# Patient Record
Sex: Male | Born: 1950 | Race: Black or African American | Hispanic: No | Marital: Married | Smoking: Never smoker
Health system: Southern US, Community
[De-identification: ages and names within clinical notes are randomized; demographics above are authoritative.]

## PROBLEM LIST (undated history)

## (undated) DIAGNOSIS — I4891 Unspecified atrial fibrillation: Secondary | ICD-10-CM

## (undated) DIAGNOSIS — F329 Major depressive disorder, single episode, unspecified: Secondary | ICD-10-CM

## (undated) DIAGNOSIS — E7800 Pure hypercholesterolemia, unspecified: Secondary | ICD-10-CM

## (undated) DIAGNOSIS — F32A Depression, unspecified: Secondary | ICD-10-CM

## (undated) DIAGNOSIS — M1990 Unspecified osteoarthritis, unspecified site: Secondary | ICD-10-CM

## (undated) DIAGNOSIS — G8929 Other chronic pain: Secondary | ICD-10-CM

## (undated) DIAGNOSIS — F1011 Alcohol abuse, in remission: Secondary | ICD-10-CM

## (undated) DIAGNOSIS — I639 Cerebral infarction, unspecified: Secondary | ICD-10-CM

## (undated) DIAGNOSIS — F419 Anxiety disorder, unspecified: Secondary | ICD-10-CM

## (undated) DIAGNOSIS — M169 Osteoarthritis of hip, unspecified: Secondary | ICD-10-CM

## (undated) DIAGNOSIS — I509 Heart failure, unspecified: Secondary | ICD-10-CM

## (undated) DIAGNOSIS — Z86718 Personal history of other venous thrombosis and embolism: Secondary | ICD-10-CM

## (undated) DIAGNOSIS — B1920 Unspecified viral hepatitis C without hepatic coma: Secondary | ICD-10-CM

## (undated) DIAGNOSIS — I2510 Atherosclerotic heart disease of native coronary artery without angina pectoris: Secondary | ICD-10-CM

## (undated) DIAGNOSIS — D649 Anemia, unspecified: Secondary | ICD-10-CM

## (undated) DIAGNOSIS — I10 Essential (primary) hypertension: Secondary | ICD-10-CM

## (undated) DIAGNOSIS — C61 Malignant neoplasm of prostate: Secondary | ICD-10-CM

## (undated) DIAGNOSIS — C649 Malignant neoplasm of unspecified kidney, except renal pelvis: Secondary | ICD-10-CM

## (undated) HISTORY — PX: CORONARY ANGIOPLASTY WITH STENT PLACEMENT: SHX49

## (undated) SURGERY — IMAGING PROCEDURE, GI TRACT, INTRALUMINAL, VIA CAPSULE
Anesthesia: LOCAL

---

## 1973-03-15 HISTORY — PX: EYE SURGERY: SHX253

## 1999-03-16 HISTORY — PX: LUMBAR DISC SURGERY: SHX700

## 2005-07-18 ENCOUNTER — Inpatient Hospital Stay (HOSPITAL_COMMUNITY): Admission: RE | Admit: 2005-07-18 | Discharge: 2005-07-22 | Payer: Self-pay | Admitting: Psychiatry

## 2005-07-19 ENCOUNTER — Ambulatory Visit: Payer: Self-pay | Admitting: Psychiatry

## 2007-01-03 ENCOUNTER — Inpatient Hospital Stay (HOSPITAL_COMMUNITY): Admission: RE | Admit: 2007-01-03 | Discharge: 2007-01-06 | Payer: Self-pay | Admitting: Orthopaedic Surgery

## 2007-01-03 HISTORY — PX: TOTAL HIP ARTHROPLASTY: SHX124

## 2007-01-03 HISTORY — PX: JOINT REPLACEMENT: SHX530

## 2010-07-28 NOTE — Op Note (Signed)
NAMEMarland Kitchen  KYSHON, TOLLIVER NO.:  [id number]   MEDICAL RECORD NO.:  [id number]          PATIENT TYPE:  INP   LOCATION:  2899                         FACILITY:  [facility]   PHYSICIAN:  Lubertha Basque. Dalldorf, M.D.DATE OF BIRTH:  06/16/1950   DATE OF PROCEDURE:  01/03/2007  DATE OF DISCHARGE:                               OPERATIVE REPORT   PREOPERATIVE DIAGNOSES:  Left hip degenerative arthritis.   POSTOPERATIVE DIAGNOSES:  Left hip degenerative arthritis.   PROCEDURE:  Left total hip replacement.   ANESTHESIA:  General.   ATTENDING SURGEON:  Dr. Marcene Corning.   ASSISTANT:  Lindwood Qua, P.A.   INDICATIONS FOR PROCEDURE:  The patient is a 60 year old male with a  long history of a painful left hip.  This persisted despite oral anti-  inflammatories and the use of a cane.  He has advanced degenerative  change on x-ray and at this point has pain which limits his ability to  rest and walk. He is offered a hip replacement operation.  Informed  operative consent was obtained after discussion of the possible  complications of reaction to anesthesia, infection, DVT, PE, leg-length  discrepancy, dislocation, and death.   SUMMARY OF FINDINGS AND PROCEDURE:  Under general anesthesia through a  posterior approach, a left total hip replacement was performed.  He had  advanced degenerative change but excellent bone quality.  We addressed  this problem with an uncemented DePuy ASR system using a six high offset  Summit stem with a size 54 ASR cup.  We placed a size 47, -1 ASR head on  the femoral neck.  Bryna Colander assisted throughout and was invaluable  to the completion of the case in that he helped position and retract  while I performed the procedure. He also closed simultaneously to help  minimize OR time.   DESCRIPTION OF PROCEDURE:  The patient was taken to the operating suite  where general anesthetic was applied without difficulty.  He was  positioned in the lateral  decubitus position with the left hip up.  An  axillary roll was placed and all bony prominences were appropriately  padded.  Hip positioners were utilized.  He was then prepped and draped  in the normal sterile fashion.  After administration of preop IV Kefzol,  a posterior approach was taken to the left hip.  All appropriate anti-  infective measures were used including the preoperative IV antibiotic,  Betadine impregnated drape, and closed hooded exhaust systems for each  member of the surgical team.  Dissection was carried down through  adipose tissue to the IT band and gluteus maximus fascia.  These very  thick structures were then incised longitudinally to expose the short  external rotators of the hip which were tagged and reflected.  A  posterior capsulectomy was performed and the hip was dislocated.  I made  a femoral neck cut just above the lesser trochanter and removed the  femoral head.  The acetabulum was fully exposed.  Some osteophytes were  removed followed by reaming towards the inside wall of the pelvis with a  small reamer.  We  then sequentially reamed up to a 53 followed by  placement of a size 54 ASR cup in appropriate anteversion and tilt.  Attention was then turned toward the femur.  The canal was sounded  followed by reaming up to a 6 followed by sequential broaching up to a  size 6 which seemed to fit well. We then performed a trial reduction and  the -1 assembly seemed to be most appropriate in terms of leg length and  stability and extension with external rotation and flexion with internal  rotation. These trial components removed followed by placement of a size  6 high offset Summit stem capped with a -1, 47 hip ball ASR assembly.  Again the hip was reduced and was stable.  Leg lengths were judged to be  roughly equal.  The wound was irrigated followed by reapproximation of  the short external rotators to the greater trochanteric region with  nonabsorbable  suture.  IT band and gluteus maximus fascia reapproximated  with #1 Vicryl interrupted fashion.  The wound was irrigated prior to  this closure and again after this layer of closure.  The subcutaneous  tissue was reapproximated with #0 and 2-0 undyed Vicryl and skin was  closed with staples.  Adaptic was applied followed by dry gauze and  tape.  Estimated blood loss and intraoperative fluids can be obtained  from the anesthesia records.   DISPOSITION:  The patient was extubated in the operating room and taken  to the recovery room in stable addition.  He was to be admitted to the  orthopedic surgery service for appropriate postop care to include  perioperative antibiotics and Coumadin plus Lovenox for DVT prophylaxis.      Lubertha Basque Jerl Santos, M.D.  Electronically Signed     PGD/MEDQ  D:  01/03/2007  T:  01/04/2007  Job:  [id number]

## 2010-07-31 NOTE — Discharge Summary (Signed)
NAME:  Frank Barron, MERA NO.:  [id number]   MEDICAL RECORD NO.:  [id number]          PATIENT TYPE:  IPS   LOCATION:  0300                          [facility]   PHYSICIAN:  Geoffery Lyons, M.D.      DATE OF BIRTH:  August 25, 1950   DATE OF ADMISSION:  07/18/2005  DATE OF DISCHARGE:  07/22/2005                                 DISCHARGE SUMMARY   CHIEF COMPLAINT AND PRESENTING ILLNESS:  This was the first admission to  [facility]  for this 60 year old African-American male,  married, separated.  Presented to the emergency room, had been having  thoughts of jumping off a bridge.  Had been on a cocaine and alcohol binge  for a couple  of days.  He did not come home, going back to his mother's  house, and felt very ashamed of himself for his drug use.  Has violated his  mother's trust and was feeling suicidal.  Separated from his wife of 37  years.  Moved back to [place] to live with his mother from his former  residence in [place], [place].  Endorsed some depressed mood, suicidal  thoughts, drinking a 6-pack of beer and possibly more for the previous 2 to  3 days.  Endorsing negative influences from his life with alcohol, and his  crack cocaine as smoke, for many years.  Doing once of twice a week.  Has  been using it regularly for the past couple of days.   PAST PSYCHIATRIC HISTORY:  First inpatient stay.   ALCOHOL AND DRUG HISTORY:  Endorsed having problem with alcohol and more so  the cocaine, which he said is a larger issue in his life.   PAST MEDICAL HISTORY:  Noncontributory.   PHYSICAL EXAMINATION:  Performed, failed to show any acute findings.   LABORATORY WORKUP:  Blood chemistries:  Sodium 138, potassium 4.1, glucose  109, BUN 10, creatinine 1.2, TSH 0.590.  Liver enzymes: SGOT 29, SGPT 30.  Total bilirubin 0.1, white blood cells 5.0, hemoglobin 14.9.  Urine drug  screen positive for cocaine.  Alcohol level upon admission 138.   MENTAL  STATUS EXAM:  Reveals a fully alert, pleasant, cooperative male.  Mood depressed, affect constricted.  Thoughts positive for some suicidal  thoughts, thinking that he would like to jump off a bridge, dealing with  guilt, ashamed of his drug use, hopeless to be able to live his life  properly. Endorsed a lot of guilt.  Minimizes use of alcohol but admits that  drugs have ruined his life.   ADMISSION DIAGNOSES:  AXIS I:  Cocaine abuse rule out dependence, alcohol  abuse, depressive disorder not otherwise specified.  AXIS II:  No diagnosis.  AXIS III:  High blood pressure.  AXIS IV:  Moderate.  AXIS V:  Upon admission 38, highest global assessment of functioning in the  last year 68.   COURSE IN HOSPITAL:  He was admitted and started in individual and group  psychotherapy. He was detoxified with Librium and was given trazodone for  sleep and started on lisinopril 10 mg per day for his  blood pressure.  He  endorsed that he came to [place] from [place].  He has family here, his  mother, his brothers.  Separated from his wife.  Dealing with the addiction.  He endorsed that when things are really going well for him, he relapses.  For him, this time was especially bad because his mother saw him like he is  when he is in full relapse.  Very ashamed of himself.  Aware of all the  consequences of his addiction, all the losses.  Strong family history of  addiction.  He endorsed depression when he is under the influence or when he  is coming off the drugs.  Endorsed that he was trying to get his life back  together, wants to get himself established in [place].  Unsure if he  wanted to go to a 28 day program.  The family was supportive but they were  able to share with him their disappointment for his relapse.  He was going  to go to [facility].  He worked towards letting go of the shame and the guilt.  We continued to work on relapse prevention.  Initially the thought was that  the family was  going to allow him a place to stay, but later on they  endorsed that they would not want to do that, so detoxed he was going to go  into a halfway house and would work with [facility].  Continued to work on a  relapse prevention plan and on May 10, he was in full contact with reality.  No active suicidal or homicidal ideation.  He was fully detoxed.  He had  dealt with all the negative emotions having to do with his relapse.  He was  committed to abstinence.   DISCHARGE DIAGNOSES:  AXIS I:  Cocaine dependence, alcohol abuse, substance-  induced depression.  AXIS II:  No diagnosis.  AXIS III:  Arterial hypertension.  AXIS IV:  Moderate.  AXIS V:  Upon discharge 55-60.   DISCHARGE MEDICATIONS:  Hydrochlorothiazide 50 mg per day, lisinopril 10 mg  per day.   DISPOSITION:  Follow up [facility] and [facility].      Geoffery Lyons, M.D.  Electronically Signed     IL/MEDQ  D:  08/11/2005  T:  08/11/2005  Job:  [id number]

## 2010-07-31 NOTE — H&P (Signed)
NAME:  Frank Barron, Frank Barron NO.:  [id number]   MEDICAL RECORD NO.:  [id number]          PATIENT TYPE:  IPS   LOCATION:  0300                          [facility]   PHYSICIAN:  Geoffery Lyons, M.D.      DATE OF BIRTH:  1950-08-07   DATE OF ADMISSION:  07/18/2005  DATE OF DISCHARGE:                         PSYCHIATRIC ADMISSION ASSESSMENT   IDENTIFYING INFORMATION:  This is a 60 year old African-American male who is  married and currently separated.  This is a voluntary admission.   HISTORY OF PRESENT ILLNESS:  This patient presented depressed in the  emergency room and had been having thoughts of possibly jumping off of a  bridge.  He had been on a cocaine and alcohol binge for a couple of days and  said that he did not come home going back to his mother's house as planned  and felt very ashamed of himself for his drug use, feels that he has  violated his mother's trust and was feeling suicidal.  He is currently  separated from his wife of 27 years 3 weeks ago and moved back to [place]  to live with his mother from his former residence in [place], [place].  The  patient endorses some depressed mood and suicidal ideation, has been  drinking at least a 6-pack of beer and possibly more for the previous 2-3  days and endorses have intermittent problems throughout his life with  alcohol, has used crack cocaine and smoked that for many years, and is  smoking approximately once to twice a week but has been using regularly for  the past couple of days.  Alcohol level in the emergency room was 138.  Urine drug screen was positive for cocaine.   PAST PSYCHIATRIC HISTORY:  This the patient's first inpatient psychiatric  admission.  He reports having no prior treatment, never been treated for  mood disorder or substance abuse before.  He does endorse having issues with  alcohol and cocaine for many, many years, with the alcohol issue being more  intermittent, the cocaine he feels  is a larger issue in his life.  He  reports that he has mental cravings for it but no real physical symptoms of  cravings.  No history of hallucinations, no prior suicide attempt.   SOCIAL HISTORY:  The patient is separated from his wife of 27 years, has 2  children, a 49 year old that lives in [place] and a 59 year old that lives  in [place].  Says that his current illness has destroyed his family life.  Currently living with his mother in [place].  His father is deceased.  Has a history of military service with honorable discharge.  He is of the  [employer].  No current legal problems.  Does have a distant history of  arrest in 1991 for embezzlement.   PAST MEDICAL HISTORY:  The patient denies any current medical problems, has  no regular primary care , did get some regular physicals in [place]  but took no medications.  Only current problem at this point is elevated  blood pressure, rule out hypertension.  Past medical history is  remarkable  for no history of seizures, blackouts, or memory loss.  Prior substance  abuse as noted.  He has a history of back surgery in 2001 for herniated disk  and eye surgery in 1975, and arthritic symptoms in his knees bilaterally.   POSITIVE PHYSICAL FINDINGS:  On admission to the unit, vital signs  temperature 98,6, pulse 93, respirations 24, blood pressure 180/106, six  feet tall, 259 pounds.  The patient's full physical examination was done in  the emergency room and is noted in the record.  On admission here to the  unit, we note that his motor exam is normal, neurologic is nonfocal, CIWA is  less than 5 this morning.   DIAGNOSTIC STUDIES:  WBC 5.0, hemoglobin 14.9, hematocrit 43.3, platelets  198,000.  Electrolytes, sodium 143, potassium 4.6, chloride 108, CO2 28, BUN  15, creatinine 1.1, random glucose 87.  Liver enzymes, SGOT 29, SGPT 30,  alkaline phosphatase 51, and total bilirubin is 0.1.  Calcium normal at 9.3.  Urine drug  screen positive for cocaine.  Alcohol level 138.   MENTAL STATUS EXAM:  Fully alert male, pleasant, cooperative, healthy in  appearance, large build, normal motor, speech normal in pace, tone and  amount.  Affect is quite blunted, mood depressed, thought process positive  for some suicidal thoughts, thinking that he would jump off a bridge,  ashamed of his drug use, feels hopeless to be able to live his life  properly.  His insight is adequate.  He does have a lot of guilt, some  minimizing of the alcohol but generally admits that drugs have ruined his  life.  Cognitively he is intact,  Memory is intact, no homicidal thoughts,  no psychosis, impulse control and judgment within normal limits..   ADMISSION DIAGNOSIS:  AXIS I:  Rule out major depression, initial episode,  versus substance-induced mood disorder, ethyl alcohol abuse rule out  dependence, cocaine abuse rule out dependence.  AXIS II:  Deferred.  AXIS III:  Elevated blood pressure, rule out hypertension.  AXIS IV:  Moderate to severe stresses in life due to social issues of  substance use.  AXIS V:  Current 38, past year 43.   INITIAL PLAN OF CARE:  Plan is to voluntarily admit the patient with q.15  minute checks in place, to alleviate his suicidal thought and safely detox  him from alcohol.  At this point we have him on a Librium protocol which he  is tolerating well.  We asked him to consider a family session with his  mother which he is going to think about.  He feels he is not ready to have  that session yet.  We are considering Wellbutrin to treat his depression but  we will let him make some progress with the Librium before we start that.  He has no history of seizures   ESTIMATED LENGTH OF STAY:  5 days.      Margaret A. Scott, N.P.      Geoffery Lyons, M.D.  Electronically Signed    MAS/MEDQ  D:  07/19/2005  T:  07/19/2005  Job:  [id number]

## 2010-07-31 NOTE — Discharge Summary (Signed)
NAME:  Frank Barron, Frank Barron             ACCOUNT NO.:  [id number]   MEDICAL RECORD NO.:  [id number]          PATIENT TYPE:  INP   LOCATION:  5030                         FACILITY:  [facility]   PHYSICIAN:  Lubertha Basque. Dalldorf, M.D.DATE OF BIRTH:  [birth year]   DATE OF ADMISSION:  01/03/2007  DATE OF DISCHARGE:  01/06/2007                               DISCHARGE SUMMARY   ADMITTING DIAGNOSIS:  Left hip end-stage degenerative joint disease.   DISCHARGE DIAGNOSIS:  Left hip end-stage degenerative joint disease.   OPERATIONS:  Left total hip replacement.   BRIEF HISTORY:  Mr. Narramore is a 60 year old black male patient, well-  known to our practice.  Has been having increasing left hip pain with  every step and trouble sleeping at nighttime.  X-rays reveal end-stage  DJD, bone on bone arthritis severe.  We have discussed treatment options  with him, that being total hip replacement, the risks of anesthesia,  infection, DVT, and possible death.   PERTINENT LABORATORY AND X-RAY DATA:  He has sinus bradycardia on EKG.  WBC is 9.9, hemoglobin 9.4 with a drop from 14.8.  Platelets 136.  Serial INRs were done, as he was on low dose Coumadin protocol for DVT  prophylaxis.  Sodium 135, potassium 3.8, glucose 112, BUN 8, creatinine  0.99.   COURSE IN THE HOSPITAL:  He was admitted after surgery.  Was placed on  regular diet and IV of lactated ringers, Ancef a gram q.8 hours x3  doses, Coumadin and Lovenox protocol per pharmacy for DVT prophylaxis  and then various other oral pain medicines, anti-medics, VITEDs,  incentive spirometry, physical therapy ordered to get him up and out of  bed, touchdown weight bearing.  The first day postop, blood pressure was  136/87, hemoglobin 11.9, temperature 101, without obvious source of  infection.  Wound was noted to be benign without drainage.  Lungs were  clear.  Abdomen was soft.  Subsequent days, the second day postop, his  blood pressure remained good at 140/88,  hemoglobin 10.9, temperature 98.  Lungs clear, abdomen soft.  Dressing was changed and wound was noted to  be benign.  No sign of infection or irritation.  He continued to  progress well with physical therapy.  He was discharged home.   CONDITION ON DISCHARGE:  Improved.   FOLLOW UP:  He will be let go on a low sodium, heart healthy diet,  crutches, touchdown weight bearing on the left side, may change his  dressing daily, keep incision clean and dry.  Return to see Dr. Jerl Santos  in 10-12 days or if there  is a sign of infection, which would be increasing redness, drainage or  pain, call the office at [phone].  He was also given a prescription for  Percocet 1 or 2 every 4-6 hours and Coumadin for 4 additional weeks  postop.  [facility], will continue to do physical therapy and  draw INRs at home.      Lindwood Qua, P.A.      Lubertha Basque Jerl Santos, M.D.  Electronically Signed    MC/MEDQ  D:  01/31/2007  T:  01/31/2007  Job:  [id number]

## 2010-12-23 LAB — CBC
HCT: 34.7 — ABNORMAL LOW
HCT: 43.3
Hemoglobin: 10.9 — ABNORMAL LOW
Hemoglobin: 11.9 — ABNORMAL LOW
Hemoglobin: 14.8
MCHC: 34.5
MCV: 89.7
Platelets: 136 — ABNORMAL LOW
Platelets: 143 — ABNORMAL LOW
Platelets: 162
RBC: 4.84
RDW: 13.5
RDW: 13.8
WBC: 10.5
WBC: 3.7 — ABNORMAL LOW
WBC: 6.5
WBC: 9.9

## 2010-12-23 LAB — BASIC METABOLIC PANEL
BUN: 11
BUN: 12
BUN: 8
CO2: 26
CO2: 29
Calcium: 8.8
Chloride: 103
Chloride: 104
Creatinine, Ser: 1.12
GFR calc Af Amer: >60
GFR calc non Af Amer: >60
Glucose, Bld: 112 — ABNORMAL HIGH
Sodium: 135
Sodium: 136

## 2010-12-23 LAB — PROTIME-INR: Prothrombin Time: 18.7 — ABNORMAL HIGH

## 2011-11-18 DIAGNOSIS — I639 Cerebral infarction, unspecified: Secondary | ICD-10-CM

## 2011-11-18 HISTORY — DX: Cerebral infarction, unspecified: I63.9

## 2012-05-10 ENCOUNTER — Encounter: Payer: Self-pay | Admitting: Physical Medicine & Rehabilitation

## 2012-06-05 ENCOUNTER — Ambulatory Visit: Payer: Self-pay | Admitting: Physical Medicine & Rehabilitation

## 2012-06-05 ENCOUNTER — Encounter: Payer: Medicaid Other | Attending: Physical Medicine & Rehabilitation

## 2012-06-20 ENCOUNTER — Encounter (HOSPITAL_COMMUNITY): Payer: Self-pay | Admitting: *Deleted

## 2012-07-03 ENCOUNTER — Ambulatory Visit (HOSPITAL_BASED_OUTPATIENT_CLINIC_OR_DEPARTMENT_OTHER): Payer: Medicaid Other | Admitting: Physical Medicine & Rehabilitation

## 2012-07-03 ENCOUNTER — Encounter: Payer: Self-pay | Admitting: Physical Medicine & Rehabilitation

## 2012-07-03 ENCOUNTER — Encounter: Payer: Medicaid Other | Attending: Physical Medicine & Rehabilitation

## 2012-07-03 VITALS — BP 137/87 | HR 67 | Resp 14 | Ht 72.0 in | Wt 235.0 lb

## 2012-07-03 DIAGNOSIS — I69920 Aphasia following unspecified cerebrovascular disease: Secondary | ICD-10-CM

## 2012-07-03 DIAGNOSIS — I669 Occlusion and stenosis of unspecified cerebral artery: Secondary | ICD-10-CM

## 2012-07-03 DIAGNOSIS — I6602 Occlusion and stenosis of left middle cerebral artery: Secondary | ICD-10-CM | POA: Insufficient documentation

## 2012-07-03 DIAGNOSIS — R488 Other symbolic dysfunctions: Secondary | ICD-10-CM

## 2012-07-03 DIAGNOSIS — R482 Apraxia: Secondary | ICD-10-CM

## 2012-07-03 DIAGNOSIS — I69320 Aphasia following cerebral infarction: Secondary | ICD-10-CM

## 2012-07-03 DIAGNOSIS — M2170 Unequal limb length (acquired), unspecified site: Secondary | ICD-10-CM | POA: Insufficient documentation

## 2012-07-03 DIAGNOSIS — M79609 Pain in unspecified limb: Secondary | ICD-10-CM | POA: Insufficient documentation

## 2012-07-03 NOTE — Progress Notes (Signed)
Subjective:    Patient ID: Frank Barron, male    DOB: 11-19-1950, 62 y.o.   MRN: [id number]  HPI For CVA 11/18/2011. Right facial droop confusion and speech problems. Had a left MCA distribution stroke. History of atrial fibrillation. Had inpatient rehabilitation [facility].Also had outpatient therapy. Moved to [place] in December of 2013. Establish primary care. Patient lives with his mother.. Patient is independent with dressing bathing and mobility. Main issue is speech. Some problems with understanding speech as well as coming up with the right word. No speech therapy since moving from [place]. Currently not driving. Road with his mother today. Has leg pain since stroke and feels like the right leg is a little longer than the left leg. No falls. He is able to use the right hand. Patient is a phasic and has problems with word finding History of hip fracture. Has 2 large heel lifts in his right shoe Pain Inventory Average Pain 10 Pain Right Now 10 My pain is sharp and aching  In the last 24 hours, has pain interfered with the following? General activity 10 Relation with others 10 Enjoyment of life 10 What TIME of day is your pain at its worst? all the time Sleep (in general) Fair  Pain is worse with: walking and standing Pain improves with: pacing activities Relief from Meds: 10  Mobility use a cane how many minutes can you walk? 30 ability to climb steps?  yes do you drive?  no  Function retired  Neuro/Psych trouble walking depression  Prior Studies Any changes since last visit?  no  Physicians involved in your care Any changes since last visit?  no   History reviewed. No pertinent family history. History   Social History  . Marital Status: Legally Separated    Spouse Name: N/A    Number of Children: N/A  . Years of Education: N/A   Social History Main Topics  . Smoking status: Never Smoker   . Smokeless tobacco: Never Used  . Alcohol Use:  No  . Drug Use: No  . Sexually Active: None   Other Topics Concern  . None   Social History Narrative  . None   Past Surgical History  Procedure Laterality Date  . Joint replacement      left hip   Past Medical History  Diagnosis Date  . Hypertension   . Anxiety   . CHF (congestive heart failure)   . Anemia   . Hepatitis     C no treatment   . Stroke    BP 137/87  Pulse 67  Resp 14  Ht 6' (1.829 m)  Wt 235 lb (106.595 kg)  BMI 31.86 kg/m2  SpO2 99%     Review of Systems  Musculoskeletal: Positive for gait problem.  Psychiatric/Behavioral: Positive for dysphoric mood.  All other systems reviewed and are negative.       Objective:   Physical Exam  Nursing note and vitals reviewed. Constitutional: He appears well-developed and well-nourished.  HENT:  Head: Normocephalic and atraumatic.  Eyes: Conjunctivae and EOM are normal. Pupils are equal, round, and reactive to light.  Musculoskeletal:  Leg length discrepancy of 3-4 cm when measured from the ASIS to the medial malleolus. The right leg is shorter than the left leg.  Neurological: He is alert. He has normal strength. Gait abnormal.  Evidence of motor apraxia has difficulty following manual muscle testing with both upper extremities Motor strength is 5/5 in bilateral upper extremities. Sensory is normal. Leg  length discrepancy noted          Assessment & Plan:  1. 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 Will order outpatient speech therapy and neuro rehabilitation. He may possibly benefit from OT as well.  2. 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 His heel lips take him out of his shoe. Will order builtup sole for the right shoe. RTC 1 month

## 2012-07-03 NOTE — Patient Instructions (Signed)
I have written a order for speech therapy at [facility] neuro rehabilitation on [address] and [employer] have a prescription for a builtup sole for the right shoe. [facility]  I recommend no driving until I see you again.

## 2012-07-11 ENCOUNTER — Ambulatory Visit (HOSPITAL_COMMUNITY): Admission: RE | Admit: 2012-07-11 | Payer: Medicaid Other | Source: Ambulatory Visit | Admitting: Gastroenterology

## 2012-07-11 HISTORY — DX: Cerebral infarction, unspecified: I63.9

## 2012-07-11 HISTORY — DX: Essential (primary) hypertension: I10

## 2012-07-11 HISTORY — DX: Anxiety disorder, unspecified: F41.9

## 2012-07-11 HISTORY — DX: Heart failure, unspecified: I50.9

## 2012-07-11 HISTORY — DX: Anemia, unspecified: D64.9

## 2012-07-11 SURGERY — ESOPHAGOGASTRODUODENOSCOPY (EGD) WITH PROPOFOL
Anesthesia: Monitor Anesthesia Care

## 2012-07-12 ENCOUNTER — Ambulatory Visit: Payer: Medicaid Other | Admitting: Speech Pathology

## 2012-07-12 ENCOUNTER — Ambulatory Visit: Payer: Medicaid Other | Attending: Physical Medicine & Rehabilitation | Admitting: *Deleted

## 2012-07-12 DIAGNOSIS — R4701 Aphasia: Secondary | ICD-10-CM | POA: Insufficient documentation

## 2012-07-12 DIAGNOSIS — R488 Other symbolic dysfunctions: Secondary | ICD-10-CM | POA: Insufficient documentation

## 2012-07-12 DIAGNOSIS — R293 Abnormal posture: Secondary | ICD-10-CM | POA: Insufficient documentation

## 2012-07-12 DIAGNOSIS — R279 Unspecified lack of coordination: Secondary | ICD-10-CM | POA: Insufficient documentation

## 2012-07-12 DIAGNOSIS — Z5189 Encounter for other specified aftercare: Secondary | ICD-10-CM | POA: Insufficient documentation

## 2012-07-12 DIAGNOSIS — R269 Unspecified abnormalities of gait and mobility: Secondary | ICD-10-CM | POA: Insufficient documentation

## 2012-07-19 ENCOUNTER — Ambulatory Visit: Payer: Medicaid Other | Attending: Internal Medicine | Admitting: Speech Pathology

## 2012-07-19 ENCOUNTER — Ambulatory Visit: Payer: Medicaid Other | Admitting: *Deleted

## 2012-07-19 DIAGNOSIS — R269 Unspecified abnormalities of gait and mobility: Secondary | ICD-10-CM | POA: Insufficient documentation

## 2012-07-19 DIAGNOSIS — R279 Unspecified lack of coordination: Secondary | ICD-10-CM | POA: Insufficient documentation

## 2012-07-19 DIAGNOSIS — R488 Other symbolic dysfunctions: Secondary | ICD-10-CM | POA: Insufficient documentation

## 2012-07-19 DIAGNOSIS — Z5189 Encounter for other specified aftercare: Secondary | ICD-10-CM | POA: Insufficient documentation

## 2012-07-19 DIAGNOSIS — R293 Abnormal posture: Secondary | ICD-10-CM | POA: Insufficient documentation

## 2012-07-19 DIAGNOSIS — R4701 Aphasia: Secondary | ICD-10-CM | POA: Insufficient documentation

## 2012-07-21 ENCOUNTER — Ambulatory Visit: Payer: Medicaid Other | Admitting: *Deleted

## 2012-07-26 ENCOUNTER — Ambulatory Visit: Payer: Medicaid Other | Admitting: Physical Therapy

## 2012-07-28 ENCOUNTER — Ambulatory Visit: Payer: Medicaid Other | Admitting: *Deleted

## 2012-07-31 ENCOUNTER — Ambulatory Visit: Payer: Medicaid Other | Admitting: Physical Therapy

## 2012-07-31 ENCOUNTER — Encounter: Payer: Medicaid Other | Attending: Physical Medicine & Rehabilitation

## 2012-07-31 ENCOUNTER — Encounter: Payer: Self-pay | Admitting: Physical Medicine & Rehabilitation

## 2012-07-31 ENCOUNTER — Ambulatory Visit (HOSPITAL_BASED_OUTPATIENT_CLINIC_OR_DEPARTMENT_OTHER): Payer: Medicaid Other | Admitting: Physical Medicine & Rehabilitation

## 2012-07-31 VITALS — BP 141/89 | HR 75 | Resp 14 | Wt 236.6 lb

## 2012-07-31 DIAGNOSIS — R488 Other symbolic dysfunctions: Secondary | ICD-10-CM

## 2012-07-31 DIAGNOSIS — R482 Apraxia: Secondary | ICD-10-CM

## 2012-07-31 DIAGNOSIS — M79609 Pain in unspecified limb: Secondary | ICD-10-CM | POA: Insufficient documentation

## 2012-07-31 DIAGNOSIS — I669 Occlusion and stenosis of unspecified cerebral artery: Secondary | ICD-10-CM

## 2012-07-31 DIAGNOSIS — I69928 Other speech and language deficits following unspecified cerebrovascular disease: Secondary | ICD-10-CM | POA: Insufficient documentation

## 2012-07-31 DIAGNOSIS — I69320 Aphasia following cerebral infarction: Secondary | ICD-10-CM

## 2012-07-31 DIAGNOSIS — I69920 Aphasia following unspecified cerebrovascular disease: Secondary | ICD-10-CM

## 2012-07-31 DIAGNOSIS — M2170 Unequal limb length (acquired), unspecified site: Secondary | ICD-10-CM | POA: Insufficient documentation

## 2012-07-31 DIAGNOSIS — I6602 Occlusion and stenosis of left middle cerebral artery: Secondary | ICD-10-CM

## 2012-07-31 NOTE — Progress Notes (Signed)
Subjective:    Patient ID: Frank Barron, male    DOB: [birth year], 62 y.o.   MRN: [id number]  HPI For CVA 11/18/2011. Right facial droop confusion and speech problems. Had a left MCA distribution stroke. History of atrial fibrillation. Had inpatient rehabilitation [facility].Also had outpatient therapy. Moved to [place] in December of 2013. Establish primary care. Patient lives with his mother.. Patient is independent with dressing bathing and mobility. Main issue is speech. Some problems with understanding speech as well as coming up with the right word. No speech therapy since moving from [place]. Currently not driving. Road with his mother today.  Has leg pain since stroke and feels like the right leg is a little longer than the left leg.  No falls. He is able to use the right hand.  Patient is a phasic and has problems with word finding  History of hip fracture. Has 2 large heel lifts in his right shoe Outpatient speech therapy and physical therapy scheduled for the next 5 weeks. No falls at home.  Pain Inventory Average Pain 7 Pain Right Now 7 My pain is dull and tingling  In the last 24 hours, has pain interfered with the following? General activity 7 Relation with others 7 Enjoyment of life 7 What TIME of day is your pain at its worst? morning Sleep (in general) Good  Pain is worse with: walking Pain improves with: medication Relief from Meds: 2  Mobility walk without assistance how many minutes can you walk? 60 do you drive?  yes  Function retired  Neuro/Psych tingling  Prior Studies Any changes since last visit?  no  Physicians involved in your care Any changes since last visit?  no   History reviewed. No pertinent family history. History   Social History  . Marital Status: Frank Barron    Spouse Name: Frank Barron    Number of Children: Frank Barron  . Years of Education: Frank Barron   Social History Main Topics  . Smoking status: Never Smoker   .  Smokeless tobacco: Never Used  . Alcohol Use: No  . Drug Use: No  . Sexually Active: None   Other Topics Concern  . None   Social History Narrative  . None   Past Surgical History  Procedure Laterality Date  . Joint replacement      left hip   Past Medical History  Diagnosis Date  . Hypertension   . Anxiety   . CHF (congestive heart failure)   . Anemia   . Hepatitis     C no treatment   . Stroke    BP 141/89  Pulse 75  Resp 14  Wt 236 lb 9.6 oz (107.321 kg)  BMI 32.08 kg/m2  SpO2 98%    Review of Systems  Skin: Positive for rash.  Neurological:       Tingling  All other systems reviewed and are negative.       Objective:   Physical Exam  Nursing note and vitals reviewed.  Constitutional: He appears well-developed and well-nourished.  HENT:  Head: Normocephalic and atraumatic.  Eyes: Conjunctivae and EOM are normal. Pupils are equal, round, and reactive to light.  Musculoskeletal:   Neurological: He is alert. He has normal strength. Gait abnormal.  Evidence of motor apraxia has difficulty following manual muscle testing with both upper extremities Motor strength is 5/5 in bilateral upper extremities. Sensory is normal. Leg length discrepancy noted        Assessment & Plan:  1. 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 Will order outpatient speech therapy and neuro rehabilitation. He may possibly benefit from OT as well.  2. 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 . Will order builtup sole for the right shoe.  RTC 2  month

## 2012-07-31 NOTE — Patient Instructions (Signed)
Anomia is a speech problem seen after strokes affecting the left middle cerebral artery.

## 2012-08-02 ENCOUNTER — Ambulatory Visit: Payer: Medicaid Other

## 2012-08-02 ENCOUNTER — Ambulatory Visit: Payer: Medicaid Other | Admitting: *Deleted

## 2012-08-03 ENCOUNTER — Ambulatory Visit: Payer: Medicaid Other | Admitting: Physical Therapy

## 2012-08-04 ENCOUNTER — Ambulatory Visit: Payer: Medicaid Other | Admitting: *Deleted

## 2012-08-10 ENCOUNTER — Ambulatory Visit: Payer: Medicaid Other | Admitting: Physical Therapy

## 2012-08-11 ENCOUNTER — Ambulatory Visit: Payer: Medicaid Other | Admitting: *Deleted

## 2012-08-16 ENCOUNTER — Ambulatory Visit: Payer: Medicaid Other | Admitting: Physical Therapy

## 2012-08-18 ENCOUNTER — Ambulatory Visit: Payer: Medicaid Other | Admitting: Physical Therapy

## 2012-08-23 ENCOUNTER — Ambulatory Visit: Payer: Medicaid Other | Admitting: Physical Therapy

## 2012-08-25 ENCOUNTER — Ambulatory Visit: Payer: Medicaid Other | Admitting: Physical Therapy

## 2012-08-30 ENCOUNTER — Ambulatory Visit: Payer: Medicaid Other | Admitting: Physical Therapy

## 2012-09-01 ENCOUNTER — Ambulatory Visit: Payer: Medicaid Other | Admitting: Physical Therapy

## 2012-09-06 ENCOUNTER — Ambulatory Visit: Payer: Medicaid Other | Admitting: Physical Therapy

## 2012-09-08 ENCOUNTER — Ambulatory Visit: Payer: Medicaid Other | Admitting: Physical Therapy

## 2012-09-29 ENCOUNTER — Encounter (HOSPITAL_COMMUNITY): Payer: Self-pay | Admitting: Internal Medicine

## 2012-09-29 ENCOUNTER — Inpatient Hospital Stay (HOSPITAL_COMMUNITY)
Admission: EM | Admit: 2012-09-29 | Discharge: 2012-10-05 | DRG: 379 | Disposition: A | Discharge status: Home / Self Care | Payer: Medicaid Other | Attending: Internal Medicine | Admitting: Internal Medicine

## 2012-09-29 DIAGNOSIS — I69920 Aphasia following unspecified cerebrovascular disease: Secondary | ICD-10-CM

## 2012-09-29 DIAGNOSIS — K922 Gastrointestinal hemorrhage, unspecified: Secondary | ICD-10-CM

## 2012-09-29 DIAGNOSIS — K921 Melena: Secondary | ICD-10-CM

## 2012-09-29 DIAGNOSIS — D649 Anemia, unspecified: Secondary | ICD-10-CM

## 2012-09-29 DIAGNOSIS — D500 Iron deficiency anemia secondary to blood loss (chronic): Secondary | ICD-10-CM | POA: Diagnosis present

## 2012-09-29 DIAGNOSIS — Z7982 Long term (current) use of aspirin: Secondary | ICD-10-CM

## 2012-09-29 DIAGNOSIS — I10 Essential (primary) hypertension: Secondary | ICD-10-CM | POA: Diagnosis present

## 2012-09-29 DIAGNOSIS — F411 Generalized anxiety disorder: Secondary | ICD-10-CM | POA: Diagnosis present

## 2012-09-29 DIAGNOSIS — I2510 Atherosclerotic heart disease of native coronary artery without angina pectoris: Secondary | ICD-10-CM

## 2012-09-29 DIAGNOSIS — Z7901 Long term (current) use of anticoagulants: Secondary | ICD-10-CM

## 2012-09-29 DIAGNOSIS — I69320 Aphasia following cerebral infarction: Secondary | ICD-10-CM

## 2012-09-29 DIAGNOSIS — B1920 Unspecified viral hepatitis C without hepatic coma: Secondary | ICD-10-CM | POA: Diagnosis present

## 2012-09-29 DIAGNOSIS — I509 Heart failure, unspecified: Secondary | ICD-10-CM | POA: Diagnosis present

## 2012-09-29 DIAGNOSIS — I4891 Unspecified atrial fibrillation: Secondary | ICD-10-CM | POA: Diagnosis present

## 2012-09-29 DIAGNOSIS — R634 Abnormal weight loss: Secondary | ICD-10-CM | POA: Diagnosis present

## 2012-09-29 DIAGNOSIS — Z9861 Coronary angioplasty status: Secondary | ICD-10-CM

## 2012-09-29 DIAGNOSIS — I6602 Occlusion and stenosis of left middle cerebral artery: Secondary | ICD-10-CM | POA: Diagnosis present

## 2012-09-29 HISTORY — DX: Major depressive disorder, single episode, unspecified: F32.9

## 2012-09-29 HISTORY — DX: Atherosclerotic heart disease of native coronary artery without angina pectoris: I25.10

## 2012-09-29 HISTORY — DX: Personal history of other venous thrombosis and embolism: Z86.718

## 2012-09-29 HISTORY — DX: Depression, unspecified: F32.A

## 2012-09-29 HISTORY — DX: Unspecified viral hepatitis C without hepatic coma: B19.20

## 2012-09-29 HISTORY — DX: Pure hypercholesterolemia, unspecified: E78.00

## 2012-09-29 LAB — BASIC METABOLIC PANEL
BUN: 21 mg/dL (ref 6–23)
CO2: 23 mEq/L (ref 19–32)
Calcium: 8.9 mg/dL (ref 8.4–10.5)
Chloride: 104 mEq/L (ref 96–112)
GFR calc Af Amer: 77 mL/min — ABNORMAL LOW (ref >90)
GFR calc non Af Amer: 66 mL/min — ABNORMAL LOW (ref >90)
Glucose, Bld: 81 mg/dL (ref 70–99)
Sodium: 136 mEq/L (ref 135–145)

## 2012-09-29 LAB — CBC WITH DIFFERENTIAL/PLATELET
Basophils Absolute: 0 10*3/uL (ref 0.0–0.1)
Basophils Relative: 0 % (ref 0–1)
Eosinophils Absolute: 0 10*3/uL (ref 0.0–0.7)
Eosinophils Relative: 0 % (ref 0–5)
HCT: 18.4 % — ABNORMAL LOW (ref 39.0–52.0)
Lymphocytes Relative: 32 % (ref 12–46)
Monocytes Absolute: 0.5 10*3/uL (ref 0.1–1.0)
Monocytes Relative: 11 % (ref 3–12)
Neutrophils Relative %: 57 % (ref 43–77)
RBC: 2.66 MIL/uL — ABNORMAL LOW (ref 4.22–5.81)
WBC: 4.3 10*3/uL (ref 4.0–10.5)

## 2012-09-29 LAB — PROTIME-INR: Prothrombin Time: 16.4 seconds — ABNORMAL HIGH (ref 11.6–15.2)

## 2012-09-29 LAB — OCCULT BLOOD, POC DEVICE: Fecal Occult Bld: POSITIVE — AB

## 2012-09-29 MED ORDER — AMLODIPINE BESYLATE 5 MG PO TABS
5.0000 mg | ORAL_TABLET | Freq: Every day | ORAL | Status: DC
  Administered 2012-09-30 – 2012-10-05 (×6): 5 mg via ORAL
  Filled 2012-09-29 (×6): qty 1

## 2012-09-29 MED ORDER — CITALOPRAM HYDROBROMIDE 10 MG PO TABS
10.0000 mg | ORAL_TABLET | Freq: Every day | ORAL | Status: DC
  Administered 2012-09-30 – 2012-10-05 (×6): 10 mg via ORAL
  Filled 2012-09-29 (×6): qty 1

## 2012-09-29 MED ORDER — SIMVASTATIN 20 MG PO TABS
20.0000 mg | ORAL_TABLET | Freq: Every day | ORAL | Status: DC
  Administered 2012-09-30 – 2012-10-04 (×5): 20 mg via ORAL
  Filled 2012-09-29 (×7): qty 1

## 2012-09-29 MED ORDER — CYCLOBENZAPRINE HCL 5 MG PO TABS
5.0000 mg | ORAL_TABLET | Freq: Three times a day (TID) | ORAL | Status: DC | PRN
  Filled 2012-09-29: qty 1

## 2012-09-29 MED ORDER — SODIUM CHLORIDE 0.9 % IJ SOLN
3.0000 mL | Freq: Two times a day (BID) | INTRAMUSCULAR | Status: DC
  Administered 2012-09-30 – 2012-10-05 (×9): 3 mL via INTRAVENOUS

## 2012-09-29 MED ORDER — ONDANSETRON HCL 4 MG/2ML IJ SOLN: 4.0000 mg | Freq: Three times a day (TID) | INTRAMUSCULAR | Status: AC | PRN

## 2012-09-29 NOTE — H&P (Signed)
[facility] History and Physical  Frank Barron  ZOX:[id number]  DOB: 1950/07/29  DOA: 09/29/2012  Referring physician: Nelva Nay PCP: Jackie Plum, MD   Chief Complaint: Generalized weakness and presyncope  HPI: Frank Barron is a 62 y.o. male with Past medical history of coronary artery disease status post PCI to LAD 2 years ago, recent stroke with residue expressive aphasia, hypertension, questionable CHF with low ejection fraction as per the family.  The patient presented today with the complaint of generalized weakness. When he was trying to stand up in a restaurant he found that he was going to pass out and had to sit down. He has been having tiredness with worsening shortness of breath on exertion since lost a few days. 3 weeks ago he had an episode of dark color melena but continued on and off. He was recently started on rivaroxaban for atrial fibrillation, after failing Coumadin. Since then he has been having on and off blood in her bowel movements. Patient also felt palpitation. Denies any chest pain, cough, fever, or diarrhea, bleeding in either stomal orthopnea, PND.  Review of Systems: as mentioned in the history of present illness.  A Comprehensive review of the other systems is negative.  Past Medical History  Diagnosis Date  . Hypertension   . Anxiety   . CHF (congestive heart failure)   . Anemia   . Hepatitis     C no treatment   . Stroke    Past Surgical History  Procedure Laterality Date  . Joint replacement      left hip   Social History:  reports that he has never smoked. He has never used smokeless tobacco. He reports that he does not drink alcohol or use illicit drugs. Patient is coming from home. Patient can participate in ADLs.  No Known Allergies  No family history on file.  Prior to Admission medications   Medication Sig Start Date End Date Taking? Authorizing Provider  amLODipine (NORVASC) 10 MG tablet Take 5 mg by mouth  daily.   Yes Historical Provider, MD  aspirin EC 81 MG tablet Take 81 mg by mouth daily.   Yes Historical Provider, MD  cholecalciferol (VITAMIN D) 1000 UNITS tablet Take 1,000 Units by mouth daily.   Yes Historical Provider, MD  citalopram (CELEXA) 20 MG tablet Take 10 mg by mouth daily.   Yes Historical Provider, MD  cyclobenzaprine (FLEXERIL) 10 MG tablet Take 5 mg by mouth 3 (three) times daily as needed for muscle spasms.   Yes Historical Provider, MD  Rivaroxaban (XARELTO) 20 MG TABS Take 20 mg by mouth every evening.    Yes Historical Provider, MD  simvastatin (ZOCOR) 40 MG tablet Take 20 mg by mouth at bedtime.   Yes Historical Provider, MD    Physical Exam: Filed Vitals:   09/29/12 2230 09/29/12 2235 09/29/12 2245 09/29/12 2300  BP: 139/95 139/95 134/90 131/89  Pulse: 59  74 64  Temp:  97.7 F (36.5 C) 98.3 F (36.8 C)   TempSrc:  Oral Oral   Resp: 15  18 17   SpO2: 100%  100% 100%    General: Alert, Awake and Oriented to Time, Place and Person. Appear in mild distress Eyes: PERRL ENT: Oral Mucosa pale. Neck: No JVD, no Carotid Bruits,  Cardiovascular: S1 and S2 Present, Murmur systolic murmur, Peripheral Pulses Present Respiratory: Clear to Auscultation, Bilateral Air entry equal and Decreased, Crackles no, wheezes no Abdomen: Bowel Sound Present, Soft and Non tender, Organomegaly Skin: no decubitus Ulcer  Extremities: Pedal edema no, calf tenderness no. Neurologic: Mental status, Motor strength, Sensation, reflexes, Proprioception Grossly Unremarkable.  Labs on Admission:  Basic Metabolic Panel:  Recent Labs Lab 09/29/12 1845  NA 136  K 4.1  CL 104  CO2 23  GLUCOSE 81  BUN 21  CREATININE 1.17  CALCIUM 8.9   Liver Function Tests: No results found for this basename: AST, ALT, ALKPHOS, BILITOT, PROT, ALBUMIN,  in the last 168 hours No results found for this basename: LIPASE, AMYLASE,  in the last 168 hours No results found for this basename: AMMONIA,  in the  last 168 hours CBC:  Recent Labs Lab 09/29/12 1845  WBC 4.3  NEUTROABS 2.4  HGB 5.8*  HCT 18.4*  MCV 69.2*  PLT 212   Cardiac Enzymes: No results found for this basename: CKTOTAL, CKMB, CKMBINDEX, TROPONINI,  in the last 168 hours  BNP (last 3 results) No results found for this basename: PROBNP,  in the last 8760 hours CBG:  Recent Labs Lab 09/29/12 1845  GLUCAP 74    Radiological Exams on Admission: No results found.  Assessment/Plan Principal Problem:   Anemia Active Problems:   Aphasia as late effect of cerebrovascular accident   Left middle cerebral artery embolism   Melena   CAD (coronary artery disease)  1. Anemia Most of him being on rivaroxaban has caused it. Patient does not have a similar episodes of GI bleed in the past. Currently we will transfuse him with 2 units. GI consult N.p.o. after midnight We will hold off aspirin and rivaroxaban at present  2. CAD Monitor on telemetry Continue Zocor  3. CHF  we'll be watchful for volume transfusion.  Patient may require IV Lasix   DVT Prophylaxis: mechanical compression device Nutrition: N.p.o.  Code Status: Full  Family Communication:  Discussed with daughter Kerry Kass: Lynden Oxford, MD [facility] Pager: [phone] 09/29/2012 11:39 PM    If 7PM-7AM, please contact night-coverage [email or web address] Password [email or web address]

## 2012-09-29 NOTE — ED Notes (Signed)
Per report from [facility] pt was walking with his brother and had a sudden onset of generalized bilateral weakness.  During evaluation by ems pt had a CBG of 74.  He is currently rehabing from a CVA R sided residual.  He is noted to have expressive aphagia.

## 2012-09-29 NOTE — ED Provider Notes (Signed)
History    CSN: [id number] Arrival date & time 09/29/12  1839  First MD Initiated Contact with Patient 09/29/12 1843     Chief Complaint  Patient presents with  . Weakness    HPI Per report from [facility] pt was walking with his brother and had a sudden onset of generalized bilateral weakness. During evaluation by ems pt had a CBG of 74. He is currently rehabing from a CVA R sided residual. He is noted to have expressive aphagia.  Patient was started on a blood pressure medicine today.  His weakness was preceded by presyncope.  It has resolved now.  Past Medical History  Diagnosis Date  . Hypertension   . Anxiety   . CHF (congestive heart failure)   . Anemia   . Hepatitis     C no treatment   . Stroke    Past Surgical History  Procedure Laterality Date  . Joint replacement      left hip   No family history on file. History  Substance Use Topics  . Smoking status: Never Smoker   . Smokeless tobacco: Never Used  . Alcohol Use: No    Review of Systems All other systems reviewed and are negative Allergies  Review of patient's allergies indicates no known allergies.  Home Medications   Current Outpatient Rx  Name  Route  Sig  Dispense  Refill  . amLODipine (NORVASC) 10 MG tablet   Oral   Take 5 mg by mouth daily.         Marland Kitchen aspirin EC 81 MG tablet   Oral   Take 81 mg by mouth daily.         . cholecalciferol (VITAMIN D) 1000 UNITS tablet   Oral   Take 1,000 Units by mouth daily.         . citalopram (CELEXA) 20 MG tablet   Oral   Take 10 mg by mouth daily.         . cyclobenzaprine (FLEXERIL) 10 MG tablet   Oral   Take 5 mg by mouth 3 (three) times daily as needed for muscle spasms.         . Rivaroxaban (XARELTO) 20 MG TABS   Oral   Take 20 mg by mouth every evening.          . simvastatin (ZOCOR) 40 MG tablet   Oral   Take 20 mg by mouth at bedtime.          BP 133/84  Pulse 62  Temp(Src) 98.7 F (37.1 C) (Oral)  Resp 10  SpO2  100% Physical Exam  Nursing note and vitals reviewed. Constitutional: He is oriented to person, place, and time. He appears well-developed and well-nourished. No distress.  HENT:  Head: Normocephalic and atraumatic.  Eyes: Pupils are equal, round, and reactive to light.  Neck: Normal range of motion.  Cardiovascular: Normal rate and intact distal pulses.   Pulmonary/Chest: No respiratory distress.  Abdominal: Normal appearance. He exhibits no distension.  Genitourinary: Guaiac positive stool.  Musculoskeletal: Normal range of motion.  Neurological: He is alert and oriented to person, place, and time. He displays no tremor. No cranial nerve deficit. Coordination and gait normal. GCS eye subscore is 4. GCS verbal subscore is 5. GCS motor subscore is 6.  Patient states he's back to his baseline.  Skin: Skin is warm and dry. No rash noted.  Psychiatric: He has a normal mood and affect. His behavior is normal.  ED Course  Procedures (including critical care time) Medications - No data to display  Labs Reviewed  BASIC METABOLIC PANEL - Abnormal; Notable for the following:    GFR calc non Af Amer 66 (*)    GFR calc Af Amer 77 (*)    All other components within normal limits  CBC WITH DIFFERENTIAL - Abnormal; Notable for the following:    RBC 2.66 (*)    Hemoglobin 5.8 (*)    HCT 18.4 (*)    MCV 69.2 (*)    MCH 21.8 (*)    RDW 16.6 (*)    All other components within normal limits  PROTIME-INR - Abnormal; Notable for the following:    Prothrombin Time 16.4 (*)    All other components within normal limits  OCCULT BLOOD, POC DEVICE - Abnormal; Notable for the following:    Fecal Occult Bld POSITIVE (*)    All other components within normal limits  GLUCOSE, CAPILLARY  POCT I-STAT TROPONIN I  PREPARE RBC (CROSSMATCH)  TYPE AND SCREEN  ABO/RH   No results found. 1. Anemia   2. GI bleed     MDM    Nelia Shi, MD 09/29/12 2225

## 2012-09-30 ENCOUNTER — Inpatient Hospital Stay (HOSPITAL_COMMUNITY): Payer: Medicaid Other

## 2012-09-30 ENCOUNTER — Encounter (HOSPITAL_COMMUNITY): Payer: Self-pay | Admitting: General Practice

## 2012-09-30 DIAGNOSIS — I69920 Aphasia following unspecified cerebrovascular disease: Secondary | ICD-10-CM

## 2012-09-30 DIAGNOSIS — K921 Melena: Principal | ICD-10-CM

## 2012-09-30 DIAGNOSIS — K922 Gastrointestinal hemorrhage, unspecified: Secondary | ICD-10-CM

## 2012-09-30 DIAGNOSIS — D649 Anemia, unspecified: Secondary | ICD-10-CM

## 2012-09-30 DIAGNOSIS — I2510 Atherosclerotic heart disease of native coronary artery without angina pectoris: Secondary | ICD-10-CM | POA: Diagnosis present

## 2012-09-30 LAB — COMPREHENSIVE METABOLIC PANEL
Albumin: 3.1 g/dL — ABNORMAL LOW (ref 3.5–5.2)
BUN: 18 mg/dL (ref 6–23)
Calcium: 8.8 mg/dL (ref 8.4–10.5)
GFR calc Af Amer: >90 mL/min (ref >90)
Glucose, Bld: 82 mg/dL (ref 70–99)
Total Protein: 6.3 g/dL (ref 6.0–8.3)

## 2012-09-30 LAB — CBC
HCT: 20.1 % — ABNORMAL LOW (ref 39.0–52.0)
HCT: 21.6 % — ABNORMAL LOW (ref 39.0–52.0)
HCT: 24.7 % — ABNORMAL LOW (ref 39.0–52.0)
Hemoglobin: 6.3 g/dL — CL (ref 13.0–17.0)
Hemoglobin: 6.8 g/dL — CL (ref 13.0–17.0)
Hemoglobin: 8 g/dL — ABNORMAL LOW (ref 13.0–17.0)
MCH: 23.4 pg — ABNORMAL LOW (ref 26.0–34.0)
MCH: 23.5 pg — ABNORMAL LOW (ref 26.0–34.0)
MCHC: 32.4 g/dL (ref 30.0–36.0)
MCHC: 32.3 g/dL (ref 30.0–36.0)
MCV: 70.8 fL — ABNORMAL LOW (ref 78.0–100.0)
MCV: 71.8 fL — ABNORMAL LOW (ref 78.0–100.0)
MCV: 72.7 fL — ABNORMAL LOW (ref 78.0–100.0)
Platelets: 200 10*3/uL (ref 150–400)
RBC: 2.84 MIL/uL — ABNORMAL LOW (ref 4.22–5.81)
RBC: 3.01 MIL/uL — ABNORMAL LOW (ref 4.22–5.81)
RBC: 3.41 MIL/uL — ABNORMAL LOW (ref 4.22–5.81)
RDW: 17.7 % — ABNORMAL HIGH (ref 11.5–15.5)
RDW: 18.2 % — ABNORMAL HIGH (ref 11.5–15.5)
RDW: 18 % — ABNORMAL HIGH (ref 11.5–15.5)
WBC: 4.3 10*3/uL (ref 4.0–10.5)
WBC: 4 10*3/uL (ref 4.0–10.5)

## 2012-09-30 LAB — PROTIME-INR: INR: 1.31 (ref 0.00–1.49)

## 2012-09-30 LAB — PRO B NATRIURETIC PEPTIDE: Pro B Natriuretic peptide (BNP): 94.7 pg/mL (ref 0–125)

## 2012-09-30 LAB — URINALYSIS, ROUTINE W REFLEX MICROSCOPIC
Glucose, UA: NEGATIVE mg/dL
Hgb urine dipstick: NEGATIVE
Leukocytes, UA: NEGATIVE
Protein, ur: NEGATIVE mg/dL
Specific Gravity, Urine: 1.014 (ref 1.005–1.030)
pH: 6.5 (ref 5.0–8.0)

## 2012-09-30 LAB — RAPID URINE DRUG SCREEN, HOSP PERFORMED: Opiates: NOT DETECTED

## 2012-09-30 LAB — HIV ANTIBODY (ROUTINE TESTING W REFLEX): HIV: NONREACTIVE

## 2012-09-30 MED ORDER — PEG-KCL-NACL-NASULF-NA ASC-C 100 G PO SOLR
0.5000 | Freq: Once | ORAL | Status: AC
  Administered 2012-10-01: 50 g via ORAL

## 2012-09-30 MED ORDER — PEG-KCL-NACL-NASULF-NA ASC-C 100 G PO SOLR
0.5000 | Freq: Once | ORAL | Status: AC
  Administered 2012-09-30: 50 g via ORAL
  Filled 2012-09-30: qty 1

## 2012-09-30 MED ORDER — PEG-KCL-NACL-NASULF-NA ASC-C 100 G PO SOLR: 1.0000 | Freq: Once | ORAL | Status: DC

## 2012-09-30 NOTE — Progress Notes (Signed)
INITIAL NUTRITION ASSESSMENT  DOCUMENTATION CODES Per approved criteria  -Not Applicable   INTERVENTION: Supplement as needed  NUTRITION DIAGNOSIS: Inadequate oral intake related to inability to eat as evidenced by NPO status.  Goal: Pt to meet >/= 90% of their estimated nutrition needs  Monitor:  Diet advancement, PO intake, weight trend, labs  Reason for Assessment: Pt identified as at nutrition risk on the Malnutrition Screen Tool  62 y.o. male  Admitting Dx: Anemia  ASSESSMENT: Pt lives alone has expressive aphasia since CVA 2013. Pt with hx of substance abuse in the past.  Pt with documented 5% weight loss x 2 months.   Height: Ht Readings from Last 1 Encounters:  09/29/12 6\' 3"  (1.905 m)    Weight: Wt Readings from Last 1 Encounters:  09/30/12 224 lb 6.9 oz (101.8 kg)    Ideal Body Weight: 89 kg   % Ideal Body Weight: 114%  Wt Readings from Last 10 Encounters:  09/30/12 224 lb 6.9 oz (101.8 kg)  09/30/12 224 lb 6.9 oz (101.8 kg)  07/31/12 236 lb 9.6 oz (107.321 kg)  07/03/12 235 lb (106.595 kg)    Usual Body Weight: 236 LB   % Usual Body Weight: 95%  BMI:  Body mass index is 28.05 kg/(m^2).  Estimated Nutritional Needs: Kcal: 2400-2600 Protein: 115-125 grams Fluid: > 2.4 L/day  Skin: no issues noted  Diet Order: Clear Liquid  EDUCATION NEEDS: -No education needs identified at this time   Intake/Output Summary (Last 24 hours) at 09/30/12 1209 Last data filed at 09/30/12 0630  Gross per 24 hour  Intake    565 ml  Output   1575 ml  Net  -1010 ml    Last BM: PTA   Labs:   Recent Labs Lab 09/29/12 1845 09/30/12 0645  NA 136 136  K 4.1 4.1  CL 104 105  CO2 23 20  BUN 21 18  CREATININE 1.17 1.02  CALCIUM 8.9 8.8  GLUCOSE 81 82    CBG (last 3)   Recent Labs  09/29/12 1845  GLUCAP 74    Scheduled Meds: . amLODipine  5 mg Oral Daily  . citalopram  10 mg Oral Daily  . peg 3350 powder  0.5 kit Oral Once   And  .  [START ON 10/01/2012] peg 3350 powder  0.5 kit Oral Once  . simvastatin  20 mg Oral QHS  . sodium chloride  3 mL Intravenous Q12H    Continuous Infusions:   Past Medical History  Diagnosis Date  . Hypertension   . Anxiety   . CHF (congestive heart failure)   . Anemia   . Stroke 11/18/2011    [name] 07/31/2012; expressive aphasia noted on 09/30/2012  . High cholesterol   . History of DVT of lower extremity     "left; long time ago" (09/30/2012)  . Depression     [name] 07/19/2005 (09/30/2012)  . Hepatitis C     pt denies this hx on 09/30/2012  . CAD (coronary artery disease)     PCI to LAD 2012    Past Surgical History  Procedure Laterality Date  . Joint replacement  01/03/2007  . Coronary angioplasty with stent placement    . Total hip arthroplasty Left 01/03/2007    [name] 01/03/2007 (09/30/2012)  . Lumbar disc surgery  2001    herniated disc/notes 07/19/2005 (09/30/2012)  . Eye surgery Right 1975    [name] 07/19/2005; "injured playing football" (09/29/2012)    Kendell Bane RD, LDN,  CNSC [phone] Pager [phone] After Hours Pager

## 2012-09-30 NOTE — Progress Notes (Signed)
[facility] PROGRESS NOTE  Janet Decesare ZOX:[id number] DOB: 1950/12/23 DOA: 09/29/2012 PCP: [facility], MD  Brief narrative: 62 y/o male with hx fo CAD s/p PCI 2 years back CVA in 2013 with residual aphasia and currently on outpt rehab, on xarelto for for A fib, Hx of depression and substance abuse in past presented with near syncope and melena with severe anemia.    Assessment/Plan: Symptomatic anemia  secondary to melena with near syncope  likely GI bleed with anticoagulation.  xarelto held. getting 3rd unit of PRBC.  [facility] consulted. Plan on colonoscopy   CVA with residual expressive aphasia  patient alert but has severe expressive aphasia and has difficulty carrying out regular conversation. i spoke with his brother who tells me this has happened since his stroke about a year back. He doesnot have any weakness. He lives alone and has some one come over to help with cooking, doesnot drive and his brother goes there frequently to help him. Will obtain PT Will check B12, RPR, tsh and HIV   Afib  rate controlled  xarelto held   CAD continue statin  Hx of CHF  no echo in system. Euvolemic. Will monitor     Code Status: full Family Communication: discussed with brother over the phone Disposition Plan: pending   Consultants:  [facility]  Procedures:  For colonoscopy  Antibiotics:  none  HPI/Subjective: Admission H&P reviewed. No further GI bleeding since admission  Objective: Filed Vitals:   09/30/12 0645 09/30/12 0745 09/30/12 0845 09/30/12 0925  BP: 123/80 117/87 128/84 122/91  Pulse: 59 58 56 55  Temp: 98 F (36.7 C) 98.4 F (36.9 C) 97.9 F (36.6 C) 98.3 F (36.8 C)  TempSrc: Oral Oral Oral Oral  Resp: 17 18 18 18   Height:      Weight:      SpO2:        Intake/Output Summary (Last 24 hours) at 09/30/12 1118 Last data filed at 09/30/12 0630  Gross per 24 hour  Intake    565 ml  Output   1575 ml  Net  -1010 ml    Filed Weights   09/29/12 2353 09/30/12 0500  Weight: 101.5 kg (223 lb 12.3 oz) 101.8 kg (224 lb 6.9 oz)    Exam:   General:  Elderly male in NAD  HEENT: no pallor, moist mucosa  Chest: clear b/l, no added sounds  CVS: NS1&S2, no murmurs  Abdomen:soft, NT, ND, BS+  EXT: warm, no edema  CNS: Alert and awake has severer expressive aphasia no motor weakness    Data Reviewed: Basic Metabolic Panel:  Recent Labs Lab 09/29/12 1845 09/30/12 0645  NA 136 136  K 4.1 4.1  CL 104 105  CO2 23 20  GLUCOSE 81 82  BUN 21 18  CREATININE 1.17 1.02  CALCIUM 8.9 8.8   Liver Function Tests:  Recent Labs Lab 09/30/12 0645  AST 34  ALT 26  ALKPHOS 36*  BILITOT 0.7  PROT 6.3  ALBUMIN 3.1*   No results found for this basename: LIPASE, AMYLASE,  in the last 168 hours No results found for this basename: AMMONIA,  in the last 168 hours CBC:  Recent Labs Lab 09/29/12 1845 09/30/12 0200 09/30/12 0645  WBC 4.3 4.3 4.0  NEUTROABS 2.4  --   --   HGB 5.8* 6.3* 6.8*  HCT 18.4* 20.1* 21.6*  MCV 69.2* 70.8* 71.8*  PLT 212 210 216   Cardiac Enzymes: No results found for  this basename: CKTOTAL, CKMB, CKMBINDEX, TROPONINI,  in the last 168 hours BNP (last 3 results)  Recent Labs  09/30/12 0150  PROBNP 94.7   CBG:  Recent Labs Lab 09/29/12 1845  GLUCAP 74    No results found for this or any previous visit (from the past 240 hour(s)).   Studies: Portable Chest 1 View  09/30/2012   *RADIOLOGY REPORT*  Clinical Data: Congestive heart failure, short of breath  PORTABLE CHEST - 1 VIEW  Comparison: Chest radiograph 10/15/ 2008  Findings: Normal mediastinum and heart silhouette.  Costophrenic angles are clear.  No effusion, infiltrate, or pneumothorax.  Low lung volumes.  IMPRESSION: Low lung volumes.  No acute findings.   Original Report Authenticated By: Genevive Bi, M.D.    Scheduled Meds: . amLODipine  5 mg Oral Daily  . citalopram  10 mg Oral Daily  . peg  3350 powder  0.5 kit Oral Once   And  . [START ON 10/01/2012] peg 3350 powder  0.5 kit Oral Once  . simvastatin  20 mg Oral QHS  . sodium chloride  3 mL Intravenous Q12H   Continuous Infusions:     Time spent: 25 minutes    ,   [facility] Pager [phone]. If 7PM-7AM, please contact night-coverage at [email or web address], password [facility] 09/30/2012, 11:18 AM  LOS: 1 day

## 2012-09-30 NOTE — Consult Note (Signed)
[facility] Consultation  Referring Provider:     [facility] Primary Care Physician:  [facility], MD Primary Gastroenterologist:    none     Reason for Consultation:  GI bleed          HPI:   Frank Barron is a 62 y.o. male with a history of CAD, s/p PCI 2 years ago. He has had a recent stroke and was recently started on Xarelto for afib.  Patient admitted yesterday with weakness, SOB. He has been having painless rectal bleeding for a couple of months, he also reports significant weight loss recently which he attributes to loss of front tooth causing chewing problems.  No bowel changes other than bleeding. No abdominal pain. Presenting hgb was 5.8 with MCV of 69. After a unit of blood hgb rose to 6.3. He has just received 2 more units of blood.   Past Medical History  Diagnosis Date  . Hypertension   . Anxiety   . CHF (congestive heart failure)   . Anemia   . Stroke 11/18/2011    /notes 07/31/2012; expressive aphasia noted on 09/30/2012  . High cholesterol   . History of DVT of lower extremity     "left; long time ago" (09/30/2012)  . Depression     /notes 07/19/2005 (09/30/2012)  . Hepatitis C     pt denies this hx on 09/30/2012  . CAD (coronary artery disease)     PCI to LAD 2012    Past Surgical History  Procedure Laterality Date  . Joint replacement  01/03/2007  . Coronary angioplasty with stent placement    . Total hip arthroplasty Left 01/03/2007    /notes 01/03/2007 (09/30/2012)  . Lumbar disc surgery  2001    herniated disc/notes 07/19/2005 (09/30/2012)  . Eye surgery Right 1975    /notes 07/19/2005; "injured playing football" (09/29/2012)    [facility]: Denies colon cancer  History  Substance Use Topics  . Smoking status: Never Smoker   . Smokeless tobacco: Never Used  . Alcohol Use: Yes     Comment: 09/30/2012 "drink a few beers/wk; don't drink qd"    Prior to Admission medications   Medication Sig Start Date End Date Taking? Authorizing  Provider  amLODipine (NORVASC) 10 MG tablet Take 5 mg by mouth daily.   Yes Historical Provider, MD  aspirin EC 81 MG tablet Take 81 mg by mouth daily.   Yes Historical Provider, MD  cholecalciferol (VITAMIN D) 1000 UNITS tablet Take 1,000 Units by mouth daily.   Yes Historical Provider, MD  citalopram (CELEXA) 20 MG tablet Take 10 mg by mouth daily.   Yes Historical Provider, MD  cyclobenzaprine (FLEXERIL) 10 MG tablet Take 5 mg by mouth 3 (three) times daily as needed for muscle spasms.   Yes Historical Provider, MD  Rivaroxaban (XARELTO) 20 MG TABS Take 20 mg by mouth every evening.    Yes Historical Provider, MD  simvastatin (ZOCOR) 40 MG tablet Take 20 mg by mouth at bedtime.   Yes Historical Provider, MD    Current Facility-Administered Medications  Medication Dose Route Frequency Provider Last Rate Last Dose  . amLODipine (NORVASC) tablet 5 mg  5 mg Oral Daily Pranav Patel, MD      . citalopram (CELEXA) tablet 10 mg  10 mg Oral Daily Pranav Patel, MD      . cyclobenzaprine (FLEXERIL) tablet 5 mg  5 mg Oral TID PRN Pranav Patel, MD      . ondansetron (ZOFRAN)   injection 4 mg  4 mg Intravenous Q8H PRN  L Beaton, MD      . simvastatin (ZOCOR) tablet 20 mg  20 mg Oral QHS Pranav Patel, MD      . sodium chloride 0.9 % injection 3 mL  3 mL Intravenous Q12H Pranav Patel, MD        Allergies as of 09/29/2012  . (No Known Allergies)    Review of Systems:    All systems reviewed and negative except where noted in HPI.    Physical Exam:  Vital signs in last 24 hours: Temp:  [97.7 F (36.5 C)-98.7 F (37.1 C)] 98 F (36.7 C) (07/19 0645) Pulse Rate:  [54-88] 59 (07/19 0645) Resp:  [10-20] 17 (07/19 0645) BP: (102-140)/(58-95) 123/80 mmHg (07/19 0645) SpO2:  [97 %-100 %] 100 % (07/19 0525) Weight:  [223 lb 12.3 oz (101.5 kg)-224 lb 6.9 oz (101.8 kg)] 224 lb 6.9 oz (101.8 kg) (07/19 0500) Last BM Date: 09/29/12 General:   Pleasant black male in NAD Head:  Normocephalic and  atraumatic. Eyes:   No icterus.   Conjunctiva pink. Ears:  Normal auditory acuity. Neck:  Supple; no masses felt Lungs:  Respirations even and unlabored. Lungs clear to auscultation bilaterally.   No wheezes, crackles, or rhonchi.  Heart:  Regular rate and rhythm Abdomen:  Soft, nondistended, nontender. Normal bowel sounds. No appreciable masses or hepatomegaly.  Rectal:  No external lesions. Scant brown stool in vault  Msk:  Symmetrical without gross deformities.  Extremities:  Without edema. Neurologic:  Alert . Some difficulty with expressing thoughts.  Cervical Nodes:  No significant cervical adenopathy. Psych:  Alert and cooperative. Normal affect.  LAB RESULTS:  Recent Labs  09/29/12 1845 09/30/12 0200 09/30/12 0645  WBC 4.3 4.3 4.0  HGB 5.8* 6.3* 6.8*  HCT 18.4* 20.1* 21.6*  PLT 212 210 216   BMET  Recent Labs  09/29/12 1845 09/30/12 0645  NA 136 136  K 4.1 4.1  CL 104 105  CO2 23 20  GLUCOSE 81 82  BUN 21 18  CREATININE 1.17 1.02  CALCIUM 8.9 8.8   LFT  Recent Labs  09/30/12 0645  PROT 6.3  ALBUMIN 3.1*  AST 34  ALT 26  ALKPHOS 36*  BILITOT 0.7   PT/INR  Recent Labs  09/29/12 2058 09/30/12 0645  LABPROT 16.4* 16.0*  INR 1.36 1.31    STUDIES: Portable Chest 1 View  09/30/2012   *RADIOLOGY REPORT*  Clinical Data: Congestive heart failure, short of breath  PORTABLE CHEST - 1 VIEW  Comparison: Chest radiograph 10/15/ 2008  Findings: Normal mediastinum and heart silhouette.  Costophrenic angles are clear.  No effusion, infiltrate, or pneumothorax.  Low lung volumes.  IMPRESSION: Low lung volumes.  No acute findings.   Original Report Authenticated By: Stewart Edmunds, M.D.   PREVIOUS ENDOSCOPIES:            none   Impression / Plan:   1. 62 year old black male with profound microcytic anemia and at least a 2 month history of painless rectal bleeding in setting of anticoagulants. Polyps/neoplasm needs to be excluded. Patient needs a  colonoscopy. The risks, benefits, and alternatives to colonoscopy with possible biopsy and possible polypectomy were discussed with the patient and he consents to proceed. We could proceed with procedure tomorrow since he hasn't had xarelto since yesterday.   2. Recent CVA. He has some residual aphasia  3. Afib, Xarelto on hold   Thanks   LOS:   1 day   Paula Guenther  09/30/2012, 9:04 AM  Chart was reviewed and patient was examined. X-rays and lab were reviewed.   Pt has chronic GI blood loss.  Will proceed with colonoscopy in am.  If negative I will do an EGD at the same sitting.   D. , M.D., FACG [facility] Cell [phone]      

## 2012-10-01 ENCOUNTER — Encounter (HOSPITAL_COMMUNITY): Payer: Self-pay

## 2012-10-01 ENCOUNTER — Encounter (HOSPITAL_COMMUNITY)
Admission: EM | Disposition: A | Discharge status: Home / Self Care | Payer: Self-pay | Source: Home / Self Care | Attending: Internal Medicine

## 2012-10-01 HISTORY — PX: COLONOSCOPY: SHX5424

## 2012-10-01 HISTORY — PX: ESOPHAGOGASTRODUODENOSCOPY: SHX5428

## 2012-10-01 LAB — CBC
HCT: 25.7 % — ABNORMAL LOW (ref 39.0–52.0)
HCT: 25.8 % — ABNORMAL LOW (ref 39.0–52.0)
Hemoglobin: 8.1 g/dL — ABNORMAL LOW (ref 13.0–17.0)
MCH: 23.1 pg — ABNORMAL LOW (ref 26.0–34.0)
MCV: 73.2 fL — ABNORMAL LOW (ref 78.0–100.0)
RBC: 3.51 MIL/uL — ABNORMAL LOW (ref 4.22–5.81)
RBC: 3.5 MIL/uL — ABNORMAL LOW (ref 4.22–5.81)
RDW: 18.2 % — ABNORMAL HIGH (ref 11.5–15.5)
WBC: 4.3 10*3/uL (ref 4.0–10.5)

## 2012-10-01 SURGERY — COLONOSCOPY
Anesthesia: Moderate Sedation

## 2012-10-01 SURGERY — EGD (ESOPHAGOGASTRODUODENOSCOPY)
Anesthesia: Moderate Sedation

## 2012-10-01 MED ORDER — MIDAZOLAM HCL 5 MG/5ML IJ SOLN
INTRAMUSCULAR | Status: DC | PRN
  Administered 2012-10-01: 1 mg via INTRAVENOUS
  Administered 2012-10-01 (×2): 2 mg via INTRAVENOUS
  Administered 2012-10-01: 1 mg via INTRAVENOUS
  Administered 2012-10-01: 2 mg via INTRAVENOUS

## 2012-10-01 MED ORDER — FENTANYL CITRATE 0.05 MG/ML IJ SOLN
INTRAMUSCULAR | Status: AC
  Filled 2012-10-01: qty 2

## 2012-10-01 MED ORDER — PANTOPRAZOLE SODIUM 40 MG IV SOLR
40.0000 mg | INTRAVENOUS | Status: DC
  Administered 2012-10-02 – 2012-10-03 (×2): 40 mg via INTRAVENOUS
  Filled 2012-10-01 (×4): qty 40

## 2012-10-01 MED ORDER — FENTANYL CITRATE 0.05 MG/ML IJ SOLN
INTRAMUSCULAR | Status: DC | PRN
  Administered 2012-10-01 (×4): 25 ug via INTRAVENOUS

## 2012-10-01 MED ORDER — DIPHENHYDRAMINE HCL 50 MG/ML IJ SOLN
INTRAMUSCULAR | Status: AC
  Filled 2012-10-01: qty 1

## 2012-10-01 MED ORDER — BUTAMBEN-TETRACAINE-BENZOCAINE 2-2-14 % EX AERO
INHALATION_SPRAY | CUTANEOUS | Status: DC | PRN
  Administered 2012-10-01: 2 via TOPICAL

## 2012-10-01 MED ORDER — FLEET ENEMA 7-19 GM/118ML RE ENEM
1.0000 | ENEMA | Freq: Once | RECTAL | Status: AC
  Administered 2012-10-01: 1 via RECTAL
  Filled 2012-10-01: qty 1

## 2012-10-01 MED ORDER — MIDAZOLAM HCL 5 MG/ML IJ SOLN
INTRAMUSCULAR | Status: AC
  Filled 2012-10-01: qty 2

## 2012-10-01 NOTE — Progress Notes (Signed)
[facility] PROGRESS NOTE  Moksh Loomer ZOX:[id number] DOB: [birth year] DOA: 09/29/2012 PCP: [facility], MD  Brief narrative:  62 y/o male with hx fo CAD s/p PCI 2 years back CVA in 2013 with residual aphasia and currently on outpt rehab, on xarelto for for A fib, Hx of depression and substance abuse in past presented with near syncope and melena with severe anemia.   Assessment/Plan:  Symptomatic anemia  secondary to melena with near syncope  likely GI bleed with anticoagulation.  xarelto held. Received 3rd unit of PRBC. Hb improved to 8.4  [facility] consulted. Plan on colonoscopy today  CVA with residual expressive aphasia  patient alert but has severe expressive aphasia and has difficulty carrying out regular conversation. i spoke with his brother who tells me this has happened since his stroke about a year back. He doesnot have any weakness. He lives alone and has some one come over to help with cooking, doesnot drive and his brother goes there frequently to help him.  Patient has been following with outpt rehab  PT consult  B12, RPR,TSH and HIV all normal  Afib  rate controlled  xarelto held   CAD  continue statin   Hx of CHF  no echo in system. Euvolemic. Will monitor   Code Status: full  Family Communication: discussed with brother over the phone  Disposition Plan: pending   Consultants:  [facility]   Procedures:  For colonoscopy   Antibiotics:  none   HPI/Subjective: No further bleeding noted. Patient NPO for colonoscopy  Objective: Filed Vitals:   09/30/12 0925 09/30/12 1425 09/30/12 2146 10/01/12 0538  BP: 122/91 122/81 127/83 102/76  Pulse: 55 62 54 54  Temp: 98.3 F (36.8 C) 98.4 F (36.9 C) 98.9 F (37.2 C) 98.3 F (36.8 C)  TempSrc: Oral Oral Oral Oral  Resp: 18 19 20 19   Height:      Weight:      SpO2:  98% 99% 98%    Intake/Output Summary (Last 24 hours) at 10/01/12 1025 Last data filed at 09/30/12 1426  Gross  per 24 hour  Intake      0 ml  Output   1400 ml  Net  -1400 ml   Filed Weights   09/29/12 2353 09/30/12 0500  Weight: 101.5 kg (223 lb 12.3 oz) 101.8 kg (224 lb 6.9 oz)    Exam:  General: Elderly male in NAD  HEENT: no pallor, moist mucosa Chest: clear b/l, no added sounds  CVS: NS1&S2, no murmurs  Abdomen:soft, NT, ND, BS+ EXT: warm, no edema  CNS: Alert and awake has severer expressive aphasia no motor weakness   Data Reviewed: Basic Metabolic Panel:  Recent Labs Lab 09/29/12 1845 09/30/12 0645  NA 136 136  K 4.1 4.1  CL 104 105  CO2 23 20  GLUCOSE 81 82  BUN 21 18  CREATININE 1.17 1.02  CALCIUM 8.9 8.8   Liver Function Tests:  Recent Labs Lab 09/30/12 0645  AST 34  ALT 26  ALKPHOS 36*  BILITOT 0.7  PROT 6.3  ALBUMIN 3.1*   No results found for this basename: LIPASE, AMYLASE,  in the last 168 hours No results found for this basename: AMMONIA,  in the last 168 hours CBC:  Recent Labs Lab 09/29/12 1845 09/30/12 0200 09/30/12 0645 09/30/12 1215 09/30/12 2103 10/01/12 0530  WBC 4.3 4.3 4.0 4.7 4.3 4.3  NEUTROABS 2.4  --   --   --   --   --  HGB 5.8* 6.3* 6.8* 8.0* 8.0* 8.4*  HCT 18.4* 20.1* 21.6* 24.7* 24.8* 25.7*  MCV 69.2* 70.8* 71.8* 72.2* 72.7* 73.2*  PLT 212 210 216 224 200 212   Cardiac Enzymes: No results found for this basename: CKTOTAL, CKMB, CKMBINDEX, TROPONINI,  in the last 168 hours BNP (last 3 results)  Recent Labs  09/30/12 0150  PROBNP 94.7   CBG:  Recent Labs Lab 09/29/12 1845  GLUCAP 74    No results found for this or any previous visit (from the past 240 hour(s)).   Studies: Portable Chest 1 View  09/30/2012   *RADIOLOGY REPORT*  Clinical Data: Congestive heart failure, short of breath  PORTABLE CHEST - 1 VIEW  Comparison: Chest radiograph 10/15/ 2008  Findings: Normal mediastinum and heart silhouette.  Costophrenic angles are clear.  No effusion, infiltrate, or pneumothorax.  Low lung volumes.  IMPRESSION: Low  lung volumes.  No acute findings.   Original Report Authenticated By: Genevive Bi, M.D.    Scheduled Meds: . amLODipine  5 mg Oral Daily  . citalopram  10 mg Oral Daily  . peg 3350 powder  0.5 kit Oral Once  . simvastatin  20 mg Oral QHS  . sodium chloride  3 mL Intravenous Q12H  . sodium phosphate  1 enema Rectal Once   Continuous Infusions:     Time spent:25 minutes    ,   [facility] Pager [phone]. If 7PM-7AM, please contact night-coverage at [email or web address], password [facility] 10/01/2012, 10:25 AM  LOS: 2 days

## 2012-10-01 NOTE — H&P (View-Only) (Signed)
[facility] Consultation  Referring Provider:     [facility] Primary Care Physician:  [facility], MD Primary Gastroenterologist:    none     Reason for Consultation:  GI bleed          HPI:   Frank Barron is a 62 y.o. male with a history of CAD, s/p PCI 2 years ago. He has had a recent stroke and was recently started on Xarelto for afib.  Patient admitted yesterday with weakness, SOB. He has been having painless rectal bleeding for a couple of months, he also reports significant weight loss recently which he attributes to loss of front tooth causing chewing problems.  No bowel changes other than bleeding. No abdominal pain. Presenting hgb was 5.8 with MCV of 69. After a unit of blood hgb rose to 6.3. He has just received 2 more units of blood.   Past Medical History  Diagnosis Date  . Hypertension   . Anxiety   . CHF (congestive heart failure)   . Anemia   . Stroke 11/18/2011    [name] 07/31/2012; expressive aphasia noted on 09/30/2012  . High cholesterol   . History of DVT of lower extremity     "left; long time ago" (09/30/2012)  . Depression     [name] 07/19/2005 (09/30/2012)  . Hepatitis C     pt denies this hx on 09/30/2012  . CAD (coronary artery disease)     PCI to LAD 2012    Past Surgical History  Procedure Laterality Date  . Joint replacement  01/03/2007  . Coronary angioplasty with stent placement    . Total hip arthroplasty Left 01/03/2007    [name] 01/03/2007 (09/30/2012)  . Lumbar disc surgery  2001    herniated disc/notes 07/19/2005 (09/30/2012)  . Eye surgery Right 1975    [name] 07/19/2005; "injured playing football" (09/29/2012)    [facility]: Denies colon cancer  History  Substance Use Topics  . Smoking status: Never Smoker   . Smokeless tobacco: Never Used  . Alcohol Use: Yes     Comment: 09/30/2012 "drink a few beers/wk; don't drink qd"    Prior to Admission medications   Medication Sig Start Date End Date Taking? Authorizing  Provider  amLODipine (NORVASC) 10 MG tablet Take 5 mg by mouth daily.   Yes Historical Provider, MD  aspirin EC 81 MG tablet Take 81 mg by mouth daily.   Yes Historical Provider, MD  cholecalciferol (VITAMIN D) 1000 UNITS tablet Take 1,000 Units by mouth daily.   Yes Historical Provider, MD  citalopram (CELEXA) 20 MG tablet Take 10 mg by mouth daily.   Yes Historical Provider, MD  cyclobenzaprine (FLEXERIL) 10 MG tablet Take 5 mg by mouth 3 (three) times daily as needed for muscle spasms.   Yes Historical Provider, MD  Rivaroxaban (XARELTO) 20 MG TABS Take 20 mg by mouth every evening.    Yes Historical Provider, MD  simvastatin (ZOCOR) 40 MG tablet Take 20 mg by mouth at bedtime.   Yes Historical Provider, MD    Current Facility-Administered Medications  Medication Dose Route Frequency Provider Last Rate Last Dose  . amLODipine (NORVASC) tablet 5 mg  5 mg Oral Daily Lynden Oxford, MD      . citalopram (CELEXA) tablet 10 mg  10 mg Oral Daily Lynden Oxford, MD      . cyclobenzaprine (FLEXERIL) tablet 5 mg  5 mg Oral TID PRN Lynden Oxford, MD      . ondansetron [facility])  injection 4 mg  4 mg Intravenous Q8H PRN Nelia Shi, MD      . simvastatin (ZOCOR) tablet 20 mg  20 mg Oral QHS Lynden Oxford, MD      . sodium chloride 0.9 % injection 3 mL  3 mL Intravenous Q12H Lynden Oxford, MD        Allergies as of 09/29/2012  . (No Known Allergies)    Review of Systems:    All systems reviewed and negative except where noted in HPI.    Physical Exam:  Vital signs in last 24 hours: Temp:  [97.7 F (36.5 C)-98.7 F (37.1 C)] 98 F (36.7 C) (07/19 0645) Pulse Rate:  [54-88] 59 (07/19 0645) Resp:  [10-20] 17 (07/19 0645) BP: (102-140)/(58-95) 123/80 mmHg (07/19 0645) SpO2:  [97 %-100 %] 100 % (07/19 0525) Weight:  [223 lb 12.3 oz (101.5 kg)-224 lb 6.9 oz (101.8 kg)] 224 lb 6.9 oz (101.8 kg) (07/19 0500) Last BM Date: 09/29/12 General:   Pleasant black male in NAD Head:  Normocephalic and  atraumatic. Eyes:   No icterus.   Conjunctiva pink. Ears:  Normal auditory acuity. Neck:  Supple; no masses felt Lungs:  Respirations even and unlabored. Lungs clear to auscultation bilaterally.   No wheezes, crackles, or rhonchi.  Heart:  Regular rate and rhythm Abdomen:  Soft, nondistended, nontender. Normal bowel sounds. No appreciable masses or hepatomegaly.  Rectal:  No external lesions. Scant brown stool in vault  Msk:  Symmetrical without gross deformities.  Extremities:  Without edema. Neurologic:  Alert . Some difficulty with expressing thoughts.  Cervical Nodes:  No significant cervical adenopathy. Psych:  Alert and cooperative. Normal affect.  LAB RESULTS:  Recent Labs  09/29/12 1845 09/30/12 0200 09/30/12 0645  WBC 4.3 4.3 4.0  HGB 5.8* 6.3* 6.8*  HCT 18.4* 20.1* 21.6*  PLT 212 210 216   BMET  Recent Labs  09/29/12 1845 09/30/12 0645  NA 136 136  K 4.1 4.1  CL 104 105  CO2 23 20  GLUCOSE 81 82  BUN 21 18  CREATININE 1.17 1.02  CALCIUM 8.9 8.8   LFT  Recent Labs  09/30/12 0645  PROT 6.3  ALBUMIN 3.1*  AST 34  ALT 26  ALKPHOS 36*  BILITOT 0.7   PT/INR  Recent Labs  09/29/12 2058 09/30/12 0645  LABPROT 16.4* 16.0*  INR 1.36 1.31    STUDIES: Portable Chest 1 View  09/30/2012   *RADIOLOGY REPORT*  Clinical Data: Congestive heart failure, short of breath  PORTABLE CHEST - 1 VIEW  Comparison: Chest radiograph 10/15/ 2008  Findings: Normal mediastinum and heart silhouette.  Costophrenic angles are clear.  No effusion, infiltrate, or pneumothorax.  Low lung volumes.  IMPRESSION: Low lung volumes.  No acute findings.   Original Report Authenticated By: Genevive Bi, M.D.   PREVIOUS ENDOSCOPIES:            none   Impression / Plan:   20. 62 year old black male with profound microcytic anemia and at least a 2 month history of painless rectal bleeding in setting of anticoagulants. Polyps/neoplasm needs to be excluded. Patient needs a  colonoscopy. The risks, benefits, and alternatives to colonoscopy with possible biopsy and possible polypectomy were discussed with the patient and he consents to proceed. We could proceed with procedure tomorrow since he hasn't had xarelto since yesterday.   2. Recent CVA. He has some residual aphasia  3. Afib, Xarelto on hold   Thanks   LOS:  1 day   Willette Cluster  09/30/2012, 9:04 AM  Chart was reviewed and patient was examined. X-rays and lab were reviewed.   Pt has chronic GI blood loss.  Will proceed with colonoscopy in am.  If negative I will do an EGD at the same sitting.  Barbette Hair. Arlyce Dice, M.D., [facility] Cell [phone]

## 2012-10-01 NOTE — Evaluation (Signed)
Physical Therapy Evaluation Patient Details Name: Frank Barron MRN: [id number] DOB: [birth year] Today's Date: 10/01/2012 Time: 4098-1191 PT Time Calculation (min): 14 min  PT Assessment / Plan / Recommendation History of Present Illness  Pt adm with anemia thought to be due to GI bleed.  GI work-up in progress.  Clinical Impression  Pt doing well with mobility and no further PT needed.    PT Assessment  Patent does not need any further PT services    Follow Up Recommendations  No PT follow up    Does the patient have the potential to tolerate intense rehabilitation      Barriers to Discharge        Equipment Recommendations  None recommended by PT    Recommendations for Other Services     Frequency      Precautions / Restrictions Precautions Precautions: None   Pertinent Vitals/Pain VSS      Mobility  Bed Mobility Bed Mobility: Supine to Sit;Sit to Supine;Sitting - Scoot to Edge of Bed Supine to Sit: 7: Independent Sitting - Scoot to Edge of Bed: 7: Independent Sit to Supine: 7: Independent Transfers Transfers: Sit to Stand;Stand to Sit Sit to Stand: 7: Independent;With upper extremity assist;From bed Stand to Sit: 7: Independent;With upper extremity assist;To bed Ambulation/Gait Ambulation/Gait Assistance: 6: Modified independent (Device/Increase time) Ambulation Distance (Feet): 175 Feet Assistive device: None Gait Pattern: Decreased dorsiflexion - right;Decreased hip/knee flexion - right    Exercises     PT Diagnosis:    PT Problem List:   PT Treatment Interventions:       PT Goals(Current goals can be found in the care plan section) Acute Rehab PT Goals PT Goal Formulation: No goals set, d/c therapy  Visit Information  Last PT Received On: 10/01/12 Assistance Needed: +1 History of Present Illness: Pt adm with anemia thought to be due to GI bleed.  GI work-up in progress.       Prior Functioning  Home Living Family/patient expects to be  discharged to:: Private residence Living Arrangements: Alone Available Help at Discharge: Available PRN/intermittently;Family Additional Comments: Pt with some aphasia so difficult to get specifics.  Pt reports some stairs but able to negotiate without difficulty. Prior Function Level of Independence: Needs assistance Gait / Transfers Assistance Needed: independent ADL's / Homemaking Assistance Needed: has someone come in to assist with cooking.  Pt's brother also visits regularly. Communication Communication: Receptive difficulties;Expressive difficulties    Cognition  Cognition Arousal/Alertness: Awake/alert Behavior During Therapy: WFL for tasks assessed/performed Overall Cognitive Status: Difficult to assess Difficult to assess due to: Impaired communication    Extremity/Trunk Assessment Upper Extremity Assessment Upper Extremity Assessment: Overall WFL for tasks assessed Lower Extremity Assessment Lower Extremity Assessment: RLE deficits/detail RLE Deficits / Details: functionally decr strength against gravity and decr muscular endurance demonstrated by decr hip flex and dorsiflexion with gait as distance incr.   Balance Balance Balance Assessed: Yes Static Standing Balance Static Standing - Balance Support: No upper extremity supported Static Standing - Level of Assistance: 7: Independent  End of Session PT - End of Session Activity Tolerance: Patient tolerated treatment well Patient left: in bed;with call bell/phone within reach Nurse Communication: Mobility status  GP     [facility] 10/01/2012, 10:06 AM  Skip Mayer PT [phone]

## 2012-10-01 NOTE — Interval H&P Note (Signed)
History and Physical Interval Note:  10/01/2012 11:29 AM  Frank Barron  has presented today for surgery, with the diagnosis of rectal bleeding, anemia  The various methods of treatment have been discussed with the patient and family. After consideration of risks, benefits and other options for treatment, the patient has consented to  Procedure(s): COLONOSCOPY (N/A) ESOPHAGOGASTRODUODENOSCOPY (EGD) (N/A) as a surgical intervention .  The patient's history has been reviewed, patient examined, no change in status, stable for surgery.  I have reviewed the patient's chart and labs.  Questions were answered to the patient's satisfaction.     The recent H&P (dated **09/30/12*) was reviewed, the patient was examined and there is no change in the patients condition since that H&P was completed.   Melvia Heaps  10/01/2012, 11:29 AM   Melvia Heaps

## 2012-10-01 NOTE — Progress Notes (Signed)
Endoscopy and colonoscopy did not demonstrate any fresh or old blood. No source for GI hemorrhage was found.  Plan capsule endoscopy

## 2012-10-02 ENCOUNTER — Encounter (HOSPITAL_COMMUNITY): Payer: Self-pay

## 2012-10-02 ENCOUNTER — Ambulatory Visit: Payer: Medicaid Other | Admitting: Physical Medicine & Rehabilitation

## 2012-10-02 ENCOUNTER — Encounter (HOSPITAL_COMMUNITY)
Admission: EM | Disposition: A | Discharge status: Home / Self Care | Payer: Self-pay | Source: Home / Self Care | Attending: Internal Medicine

## 2012-10-02 HISTORY — PX: GIVENS CAPSULE STUDY: SHX5432

## 2012-10-02 LAB — CBC
HCT: 23.4 % — ABNORMAL LOW (ref 39.0–52.0)
HCT: 26.4 % — ABNORMAL LOW (ref 39.0–52.0)
Hemoglobin: 7.4 g/dL — ABNORMAL LOW (ref 13.0–17.0)
Hemoglobin: 8.4 g/dL — ABNORMAL LOW (ref 13.0–17.0)
MCH: 23.7 pg — ABNORMAL LOW (ref 26.0–34.0)
MCHC: 31.8 g/dL (ref 30.0–36.0)
MCV: 73.8 fL — ABNORMAL LOW (ref 78.0–100.0)
MCV: 74.4 fL — ABNORMAL LOW (ref 78.0–100.0)
RBC: 3.55 MIL/uL — ABNORMAL LOW (ref 4.22–5.81)
RDW: 18.4 % — ABNORMAL HIGH (ref 11.5–15.5)
WBC: 4.5 10*3/uL (ref 4.0–10.5)

## 2012-10-02 SURGERY — IMAGING PROCEDURE, GI TRACT, INTRALUMINAL, VIA CAPSULE
Anesthesia: LOCAL

## 2012-10-02 MED ORDER — BISACODYL 5 MG PO TBEC
10.0000 mg | DELAYED_RELEASE_TABLET | Freq: Once | ORAL | Status: DC
  Filled 2012-10-02: qty 2

## 2012-10-02 MED ORDER — SODIUM CHLORIDE 0.9 % IV SOLN: INTRAVENOUS | Status: DC

## 2012-10-02 SURGICAL SUPPLY — 1 items: TOWEL COTTON PACK 4EA (MISCELLANEOUS) ×4 IMPLANT

## 2012-10-02 NOTE — Progress Notes (Signed)
Utilization review completed.  

## 2012-10-02 NOTE — Progress Notes (Signed)
     Frank Barron Daily Rounding Note 10/02/2012, 11:21 AM  SUBJECTIVE:       No BM since prep.  Feels well.  hyungry   Cap endo in progress  OBJECTIVE:         Vital signs in last 24 hours:    Temp:  [97.5 F (36.4 C)-98.4 F (36.9 C)] 98.3 F (36.8 C) (07/21 1045) Pulse Rate:  [57-72] 57 (07/21 1045) Resp:  [9-30] 16 (07/21 1045) BP: (94-132)/(55-81) 132/81 mmHg (07/21 1045) SpO2:  [99 %-100 %] 100 % (07/21 0827) Last BM Date: 10/01/12 General: looks well   Heart: rrr Chest: clear Abdomen: soft. Not tender.  BS active  Extremities: no pedal edema Neuro/Psych:  Pleasant , not anxious, not confused.   Intake/Output from previous day: 07/20 0701 - 07/21 0700 In: 120 [P.O.:120] Out: -   Intake/Output this shift: Total I/O In: 312.5 [Blood:312.5] Out: -   Lab Results:  Recent Labs  10/01/12 0530 10/01/12 1831 10/02/12 0448  WBC 4.3 4.6 4.5  HGB 8.4* 8.1* 7.4*  HCT 25.7* 25.8* 23.4*  PLT 212 198 205   BMET  Recent Labs  09/29/12 1845 09/30/12 0645  NA 136 136  K 4.1 4.1  CL 104 105  CO2 23 20  GLUCOSE 81 82  BUN 21 18  CREATININE 1.17 1.02  CALCIUM 8.9 8.8   ASSESMENT: *  Painless rectal bleeding. Colonoscopy 7/20 *  Anemia.   S/p transfusion PRBC x 3.  EGD 7/20.  No op note but states "normal", no fresh or old blood *  Xarelto, chronic.  On hold  Now.  *  Hx CVA *  Weight loss.    PLAN: *  Capsule endoscopy in process.  If no source found, do we need to scan his bellly and check for RP or intrabdoninal bleeding?  However no pain so this diagnoses not likely.  *  One more unit of blood ordered.    LOS: 3 days   Jennye Moccasin  10/02/2012, 11:21 AM Pager: [phone]

## 2012-10-02 NOTE — Progress Notes (Signed)
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   Assessment/Plan:  Symptomatic anemia  secondary to melena with near syncope  likely GI bleed with anticoagulation.  xarelto held. Received 3 unit of PRBC so far. [facility] consulted. EGD and colonoscopy was unable to locate source of bleeding.H&H again low at 7.4 . Ordered another unit of PRBC.  Patient scheduled for capsule endoscopy today.  CVA with residual expressive aphasia  Patient has severe expressive aphasia and has difficulty carrying out regular conversation. His brother informs that this has happened since his stroke about a year back. He doesnot have any weakness. He lives alone and has some one come over to help with cooking, doesnot drive and his brother goes there frequently to help him.  Patient has been following with outpt rehab  PT consult  B12, RPR,TSH and HIV all normal   Afib  rate controlled  xarelto held   CAD  continue statin   Hx of CHF  no echo in system. Euvolemic. Will monitor   Code Status: full  Family Communication: discussed with brother over the phone  Disposition Plan: pending   Consultants:  [facility]   Procedures:  EGD and colonoscopy on 7/20  capsule endoscopy 7/21  Antibiotics:  None  HPI/Subjective:  No further bleeding reported  but hb still low.    Objective: Filed Vitals:   10/01/12 1501 10/01/12 2113 10/02/12 0600 10/02/12 0827  BP: 112/76 113/65 123/80 117/77  Pulse: 72 71 63 62  Temp: 97.5 F (36.4 C) 98.3 F (36.8 C) 98 F (36.7 C) 98.2 F (36.8 C)  TempSrc: Oral Oral Oral Oral  Resp: 16 18 18 18   Height:      Weight:       SpO2: 100% 100% 100% 100%    Intake/Output Summary (Last 24 hours) at 10/02/12 0829 Last data filed at 10/01/12 1744  Gross per 24 hour  Intake    120 ml  Output      0 ml  Net    120 ml   Filed Weights   09/29/12 2353 09/30/12 0500  Weight: 101.5 kg (223 lb 12.3 oz) 101.8 kg (224 lb 6.9 oz)    Exam:  General: Elderly male in NAD  HEENT: no pallor, moist mucosa Chest: clear b/l, no added sounds  CVS: NS1&S2, no murmurs  Abdomen:soft, NT, ND, BS+ EXT: warm, no edema  CNS: Alert and awake has severer expressive aphasia no motor weakness   Data Reviewed: Basic Metabolic Panel:  Recent Labs Lab 09/29/12 1845 09/30/12 0645  NA 136 136  K 4.1 4.1  CL 104 105  CO2 23 20  GLUCOSE 81 82  BUN 21 18  CREATININE 1.17 1.02  CALCIUM 8.9 8.8   Liver Function Tests:  Recent Labs Lab 09/30/12 0645  AST 34  ALT 26  ALKPHOS 36*  BILITOT 0.7  PROT 6.3  ALBUMIN 3.1*   No results found for this basename: LIPASE, AMYLASE,  in the last 168 hours No results found for this basename: AMMONIA,  in the last 168 hours CBC:  Recent Labs Lab 09/29/12 1845  09/30/12 1215 09/30/12 2103 10/01/12  0530 10/01/12 1831 10/02/12 0448  WBC 4.3  < > 4.7 4.3 4.3 4.6 4.5  NEUTROABS 2.4  --   --   --   --   --   --   HGB 5.8*  < > 8.0* 8.0* 8.4* 8.1* 7.4*  HCT 18.4*  < > 24.7* 24.8* 25.7* 25.8* 23.4*  MCV 69.2*  < > 72.2* 72.7* 73.2* 73.7* 73.8*  PLT 212  < > 224 200 212 198 205  < > = values in this interval not displayed. Cardiac Enzymes: No results found for this basename: CKTOTAL, CKMB, CKMBINDEX, TROPONINI,  in the last 168 hours BNP (last 3 results)  Recent Labs  09/30/12 0150  PROBNP 94.7   CBG:  Recent Labs Lab 09/29/12 1845  GLUCAP 74    No results found for this or any previous visit (from the past 240 hour(s)).   Studies: No results found.  Scheduled Meds: . amLODipine  5 mg Oral Daily  . citalopram  10 mg Oral Daily  . pantoprazole (PROTONIX) IV  40 mg  Intravenous Q24H  . simvastatin  20 mg Oral QHS  . sodium chloride  3 mL Intravenous Q12H   Continuous Infusions:    Time spent:  25 minutes    ,   [facility] Pager [phone]. If 7PM-7AM, please contact night-coverage at [email or web address], password [facility] 10/02/2012, 8:29 AM  LOS: 3 days

## 2012-10-02 NOTE — Op Note (Signed)
[facility] [address] [place], [address]   ENDOSCOPY PROCEDURE REPORT  PATIENT: Frank Barron, Frank Barron  MR#: [id number] BIRTHDATE: 07-14-1950 , 61  yrs. old GENDER: Male ENDOSCOPIST: Louis Meckel, MD REFERRED BY: PROCEDURE DATE:  10/01/2012 PROCEDURE:  EGD, diagnostic ASA CLASS:     Class II INDICATIONS:  Melena. while on xarelto MEDICATIONS: There was residual sedation effect present from prior procedure, Versed 2 mg IV, and Fentanyl 25 mcg IV TOPICAL ANESTHETIC: Cetacaine Spray  DESCRIPTION OF PROCEDURE: After the risks benefits and alternatives of the procedure were thoroughly explained, informed consent was obtained.  The    endoscope was introduced through the mouth and advanced to the third portion of the duodenum. Without limitations. The instrument was slowly withdrawn as the mucosa was fully examined.      The upper, middle and distal third of the esophagus were carefully inspected and no abnormalities were noted.  The z-line was well seen at the GEJ.  The endoscope was pushed into the fundus which was normal including a retroflexed view.  The antrum, gastric body, first and second part of the duodenum were unremarkable. Retroflexed views revealed no abnormalities.     The scope was then withdrawn from the patient and the procedure completed.  COMPLICATIONS: There were no complications. ENDOSCOPIC IMPRESSION: Normal EGD  RECOMMENDATIONS: Capsule endoscopy  REPEAT EXAM:  eSigned:  Louis Meckel, MD 10/01/2012 12:12 PM   CC:

## 2012-10-02 NOTE — Op Note (Signed)
[facility] [address] [place], [address]   COLONOSCOPY PROCEDURE REPORT  PATIENT: Frank Barron, Frank Barron  MR#: [id number] BIRTHDATE: [birth year] , 61  yrs. old GENDER: Male ENDOSCOPIST: Louis Meckel, MD REFERRED BY: PROCEDURE DATE:  10/01/2012 PROCEDURE:   Colonoscopy, diagnostic ASA CLASS:   Class II INDICATIONS:hematochezia. MEDICATIONS: These medications were titrated to patient response per physician's verbal order, Versed 6 mg IV, and Fentanyl 50 mcg IV  DESCRIPTION OF PROCEDURE:   After the risks benefits and alternatives of the procedure were thoroughly explained, informed consent was obtained.  A digital rectal exam revealed no abnormalities of the rectum.   The Pentax Ped Colon [id number] endoscope was introduced through the anus and advanced to the ileum. No adverse events experienced.   The quality of the prep was good, using MiraLax  The instrument was then slowly withdrawn as the colon was fully examined.      COLON FINDINGS: The mucosa appeared normal in the terminal ileum. A normal appearing cecum, ileocecal valve, and appendiceal orifice were identified.  The ascending, hepatic flexure, transverse, splenic flexure, descending, sigmoid colon and rectum appeared unremarkable.  No polyps or cancers were seen.  Retroflexed views revealed no abnormalities. The time to cecum=  .  Withdrawal time=11 minutes 0 seconds.  The scope was withdrawn and the procedure completed. COMPLICATIONS: There were no complications.  ENDOSCOPIC IMPRESSION: 1.   Normal mucosa in the terminal ileum 2.   Normal colon  RECOMMENDATIONS: Upper endoscopy   eSigned:  Louis Meckel, MD 10/01/2012 12:03 PM   cc:

## 2012-10-02 NOTE — Progress Notes (Signed)
Agree with Ms. Gribbin's assessment and plan. Carl E. Gessner, MD, FACG  

## 2012-10-03 ENCOUNTER — Encounter (HOSPITAL_COMMUNITY): Payer: Self-pay | Admitting: Gastroenterology

## 2012-10-03 LAB — TYPE AND SCREEN
ABO/RH(D): O POS
Antibody Screen: NEGATIVE
Unit division: 0
Unit division: 0
Unit division: 0

## 2012-10-03 LAB — CBC
HCT: 24.8 % — ABNORMAL LOW (ref 39.0–52.0)
Hemoglobin: 7.8 g/dL — ABNORMAL LOW (ref 13.0–17.0)
MCH: 23.2 pg — ABNORMAL LOW (ref 26.0–34.0)
MCH: 24 pg — ABNORMAL LOW (ref 26.0–34.0)
MCHC: 31.5 g/dL (ref 30.0–36.0)
MCHC: 32.1 g/dL (ref 30.0–36.0)
MCV: 73.8 fL — ABNORMAL LOW (ref 78.0–100.0)
MCV: 74.7 fL — ABNORMAL LOW (ref 78.0–100.0)
Platelets: 198 10*3/uL (ref 150–400)
RBC: 3.59 MIL/uL — ABNORMAL LOW (ref 4.22–5.81)
RDW: 18.6 % — ABNORMAL HIGH (ref 11.5–15.5)
RDW: 18.8 % — ABNORMAL HIGH (ref 11.5–15.5)

## 2012-10-03 LAB — GLUCOSE, CAPILLARY: Glucose-Capillary: 92 mg/dL (ref 70–99)

## 2012-10-03 MED ORDER — RIVAROXABAN 20 MG PO TABS
20.0000 mg | ORAL_TABLET | Freq: Every evening | ORAL | Status: DC
  Administered 2012-10-04: 20 mg via ORAL
  Filled 2012-10-03 (×3): qty 1

## 2012-10-03 NOTE — Progress Notes (Signed)
NUTRITION FOLLOW UP  Intervention:   No interventions at this time.   Nutrition Dx:   Inadequate oral intake related to inability to eat as evidenced by NPO status. Resolved.  Goal:   Pt to meet >/= 90% of their estimated nutrition needs - met.  Monitor:   Diet advancement, PO intake, weight trend, labs  Assessment:   Pt lives alone has expressive aphasia since CVA 2013. Pt with hx of substance abuse in the past. Pt with documented 5% weight loss x 2 months. Admitted with anemia 2/2 melena.  Underwent endoscopy and colonoscopy 7/21. MD to read capsule endo today. Tolerating Heart Healthy diet. Consuming 85 - 100% of meals. Pt denies any questions/concerns regarding nutrition. Feels as though he is getting enough to eat. Confirms great appetite.  Height: Ht Readings from Last 1 Encounters:  09/29/12 6\' 3"  (1.905 m)    Weight Status:   Wt Readings from Last 1 Encounters:  09/30/12 224 lb 6.9 oz (101.8 kg)  No new weight.  Re-estimated needs:  Kcal: 2000 - 2200  Protein: 100 - 115 g Fluid: at least 2.2 liters daily  Skin: intact  Diet Order: Cardiac   Intake/Output Summary (Last 24 hours) at 10/03/12 1111 Last data filed at 10/03/12 0900  Gross per 24 hour  Intake    480 ml  Output      0 ml  Net    480 ml    Last BM: 7/20   Labs:   Recent Labs Lab 09/29/12 1845 09/30/12 0645  NA 136 136  K 4.1 4.1  CL 104 105  CO2 23 20  BUN 21 18  CREATININE 1.17 1.02  CALCIUM 8.9 8.8  GLUCOSE 81 82    CBG (last 3)   Recent Labs  10/03/12 0740  GLUCAP 92    Scheduled Meds: . amLODipine  5 mg Oral Daily  . citalopram  10 mg Oral Daily  . pantoprazole (PROTONIX) IV  40 mg Intravenous Q24H  . simvastatin  20 mg Oral QHS  . sodium chloride  3 mL Intravenous Q12H    Continuous Infusions: . sodium chloride      Jarold Motto MS, RD, LDN Pager: [phone] After-hours pager: [phone]

## 2012-10-03 NOTE — Progress Notes (Signed)
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   Assessment/Plan:  Symptomatic anemia  secondary to melena with near syncope  suspected GI bleed with anticoagulation.  xarelto held. Received 4 unit of PRBC so far. We do not have a baseline hb since 2008 and it is possible he has chronic anemia ( last hb in 2008 was 10) [facility] consulted. EGD and colonoscopy was unable to locate source of bleeding.H&H capsule endoscopy today was normal  GI recommends resuming anticoagulation and monitor on iron supplements  i will resume him on xarelto today and monitor  . If his H&H is stable without further bleeding he can be discharged home with close outpt monitoring in next 24 -48 hours.   CVA with residual expressive aphasia  Patient has severe expressive aphasia and has difficulty carrying out regular conversation. His brother informs that this has happened since his stroke about a year back. He doesnot have any weakness. He lives alone and has some one come over to help with cooking, doesnot drive and his brother goes there frequently to help him.  Patient has been following with outpt rehab  PT consult  B12, RPR,TSH and HIV all normal   Afib  rate controlled  xarelto held . May resume from tomorrow if H&H stale  CAD  continue statin   Hx of CHF  no echo in system. Euvolemic.   Code Status: full  Family Communication: discussed with brother over the phone  Disposition Plan: d/c home in 24-48 hrs if H&H remains stable and no further bleeding after xarelto resumed today.   Consultants:  [facility] Procedures:  EGD and colonoscopy on 7/20  capsule  endoscopy 7/21    Antibiotics:  None    HPI/Subjective:  No further bleeding   Objective: Filed Vitals:   10/02/12 1351 10/02/12 2100 10/03/12 0517 10/03/12 1323  BP: 124/85 127/76 120/84 134/83  Pulse: 69 64 62 58  Temp: 98.2 F (36.8 C) 98.2 F (36.8 C) 98.3 F (36.8 C) 98.7 F (37.1 C)  TempSrc: Oral Oral Oral Oral  Resp: 18 18 18 18   Height:      Weight:      SpO2: 100% 100% 98% 100%    Intake/Output Summary (Last 24 hours) at 10/03/12 1802 Last data filed at 10/03/12 1700  Gross per 24 hour  Intake    960 ml  Output   1000 ml  Net    -40 ml   Filed Weights   09/29/12 2353 09/30/12 0500  Weight: 101.5 kg (223 lb 12.3 oz) 101.8 kg (224 lb 6.9 oz)    Exam:  General: Elderly male in NAD  HEENT: no pallor, moist mucosa Chest: clear b/l, no added sounds  CVS: NS1&S2, no murmurs  Abdomen:soft, NT, ND, BS+ EXT: warm, no edema  CNS: Alert and awake has severer expressive aphasia no motor weakness   Data Reviewed: Basic Metabolic Panel:  Recent Labs Lab 09/29/12 1845 09/30/12 0645  NA 136 136  K 4.1 4.1  CL 104 105  CO2 23 20  GLUCOSE 81 82  BUN 21 18  CREATININE 1.17 1.02  CALCIUM 8.9 8.8  Liver Function Tests:  Recent Labs Lab 09/30/12 0645  AST 34  ALT 26  ALKPHOS 36*  BILITOT 0.7  PROT 6.3  ALBUMIN 3.1*   No results found for this basename: LIPASE, AMYLASE,  in the last 168 hours No results found for this basename: AMMONIA,  in the last 168 hours CBC:  Recent Labs Lab 09/29/12 1845  10/01/12 1831 10/02/12 0448 10/02/12 1830 10/03/12 0445 10/03/12 1736  WBC 4.3  < > 4.6 4.5 4.8 4.3 5.0  NEUTROABS 2.4  --   --   --   --   --   --   HGB 5.8*  < > 8.1* 7.4* 8.4* 7.8* 8.6*  HCT 18.4*  < > 25.8* 23.4* 26.4* 24.8* 26.8*  MCV 69.2*  < > 73.7* 73.8* 74.4* 73.8* 74.7*  PLT 212  < > 198 205 190 193 198  < > = values in this interval not displayed. Cardiac Enzymes: No results found for this basename: CKTOTAL, CKMB, CKMBINDEX,  TROPONINI,  in the last 168 hours BNP (last 3 results)  Recent Labs  09/30/12 0150  PROBNP 94.7   CBG:  Recent Labs Lab 09/29/12 1845 10/03/12 0740  GLUCAP 74 92    No results found for this or any previous visit (from the past 240 hour(s)).   Studies: No results found.  Scheduled Meds: . amLODipine  5 mg Oral Daily  . citalopram  10 mg Oral Daily  . pantoprazole (PROTONIX) IV  40 mg Intravenous Q24H  . simvastatin  20 mg Oral QHS  . sodium chloride  3 mL Intravenous Q12H   Continuous Infusions: . sodium chloride        Time spent: 25 minutes    ,   [facility] Pager [phone]. If 7PM-7AM, please contact night-coverage at [email or web address], password [facility] 10/03/2012, 6:02 PM  LOS: 4 days

## 2012-10-03 NOTE — Progress Notes (Signed)
     [facility] Rounding Note 10/03/2012, 8:51 AM  SUBJECTIVE:       Small  Brown stool this AM.  Has never had bloody or melenic stool.  Just felt dizzy, weak, presyncopal.  None of these sxs now present.  On solid diet  OBJECTIVE:         Vital signs in last 24 hours:    Temp:  [98.2 F (36.8 C)-98.4 F (36.9 C)] 98.3 F (36.8 C) (07/22 0517) Pulse Rate:  [57-69] 62 (07/22 0517) Resp:  [16-18] 18 (07/22 0517) BP: (120-132)/(59-85) 120/84 mmHg (07/22 0517) SpO2:  [98 %-100 %] 98 % (07/22 0517) Last BM Date: 10/01/12 General: looks well, comfortable   Heart: Irreg but rate controlled Chest: clear.  Unlabored breathing Abdomen: soft, acctive BS, ND.  Extremities: no CCE Neuro/Psych:  Pleasant, relaxed, no confusion or somnolence.   Intake/Output from previous day: 07/21 0701 - 07/22 0700 In: 432.5 [P.O.:120; Blood:312.5] Out: -   Intake/Output this shift:    Lab Results:  Recent Labs  10/02/12 0448 10/02/12 1830 10/03/12 0445  WBC 4.5 4.8 4.3  HGB 7.4* 8.4* 7.8*  HCT 23.4* 26.4* 24.8*  PLT 205 190 193    ASSESMENT: * Anemia. S/p transfusion PRBC x 4. No lingering anemia sxs EGD 7/20:  Normal Capsule endo 7/21 will need to be read.  *  Intermittent painless rectal bleeding over a few months, not present in days PTA. Colonoscopy 7/20:  Normal into TI.  * Xarelto, chronic. For A fib, remote stroke, remote DVTOn hold. * Hx CVA  * Weight loss.     PLAN: *  Dr Leone Payor to read capsule endo today.  No restart of xarelto yet.  *  [facility] and resched[facility] for 7/20.  Should get CBC drawn then *  ? Home today.  May want to hold discharge until after Dr Leone Payor talks to pt. Will need defined restart date for Xarelto     LOS: 4 days   Jennye Moccasin  10/03/2012, 8:51 AM Pager: [phone]  Capsule endoscopy negative - small bowel normal. Would restart anticoagulation, observe and treat with iron. If persistent ?'s of bleeding, anemia remain  can see GI and consider repeating tests.  Iva Boop, MD, [facility] [phone] (pager) 10/03/2012 5:21 PM

## 2012-10-04 LAB — CBC
Hemoglobin: 8.1 g/dL — ABNORMAL LOW (ref 13.0–17.0)
MCH: 23.3 pg — ABNORMAL LOW (ref 26.0–34.0)
MCHC: 31.3 g/dL (ref 30.0–36.0)
RDW: 18.9 % — ABNORMAL HIGH (ref 11.5–15.5)

## 2012-10-04 MED ORDER — PANTOPRAZOLE SODIUM 40 MG PO TBEC
40.0000 mg | DELAYED_RELEASE_TABLET | Freq: Every day | ORAL | Status: DC
  Administered 2012-10-04 – 2012-10-05 (×2): 40 mg via ORAL
  Filled 2012-10-04 (×2): qty 1

## 2012-10-04 MED ORDER — FUROSEMIDE 10 MG/ML IJ SOLN
40.0000 mg | Freq: Once | INTRAMUSCULAR | Status: AC
  Administered 2012-10-04: 40 mg via INTRAVENOUS
  Filled 2012-10-04 (×2): qty 4

## 2012-10-04 NOTE — Progress Notes (Signed)
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   Assessment/Plan:  Symptomatic anemia  Secondary to melena with near syncope  suspected GI bleed with anticoagulation.  Received 4 unit of PRBC so far. We do not have a baseline hb since 2008 and it is possible he has chronic anemia ( last hb in 2008 was 10) [facility] consulted. EGD and colonoscopy was unable to locate source of bleeding. H&H capsule endoscopy today was normal GI recommends resuming anticoagulation and monitor on iron supplements I will resume him on xarelto today and monitor. Plan is to continue monitor his hemoglobin, if his hemoglobin is stable in a.m. can be discharged.  CVA with residual expressive aphasia  Patient has severe expressive aphasia and has difficulty carrying out regular conversation. His brother informs that this has happened since his stroke about a year back. He doesnot have any weakness. He lives alone and has some one come over to help with cooking, doesnot drive and his brother goes there frequently to help him.  Patient has been following with outpt rehab  PT consult  B12, RPR,TSH and HIV all normal   Afib  Rate controlled. Xarelto restarted yesterday.  CAD  continue statin   Hx of CHF  no echo in system. Euvolemic.   Code Status: full  Family Communication: discussed with brother over the phone  Disposition Plan: d/c home in 24-48 hrs if H&H remains stable and no further bleeding after xarelto resumed today.   Consultants:  [facility] Procedures:  EGD and  colonoscopy on 7/20  capsule endoscopy 7/21    Antibiotics:  None    Objective: Filed Vitals:   10/03/12 1323 10/03/12 2130 10/04/12 0522 10/04/12 0949  BP: 134/83 146/89 123/81 139/86  Pulse: 58 59 61   Temp: 98.7 F (37.1 C) 98.3 F (36.8 C) 97.9 F (36.6 C)   TempSrc: Oral Oral Oral   Resp: 18 18 18    Height:      Weight:   101.424 kg (223 lb 9.6 oz)   SpO2: 100% 100% 98%     Intake/Output Summary (Last 24 hours) at 10/04/12 1144 Last data filed at 10/04/12 0800  Gross per 24 hour  Intake    960 ml  Output   1000 ml  Net    -40 ml   Filed Weights   09/29/12 2353 09/30/12 0500 10/04/12 0522  Weight: 101.5 kg (223 lb 12.3 oz) 101.8 kg (224 lb 6.9 oz) 101.424 kg (223 lb 9.6 oz)    Exam:  General: Elderly male in NAD  HEENT: no pallor, moist mucosa Chest: clear b/l, no added sounds  CVS: NS1&S2, no murmurs  Abdomen:soft, NT, ND, BS+ EXT: warm, no edema  CNS: Alert and awake has severer expressive aphasia no motor weakness   Data Reviewed: Basic Metabolic Panel:  Recent Labs Lab 09/29/12 1845 09/30/12 0645  NA 136 136  K 4.1 4.1  CL 104 105  CO2 23 20  GLUCOSE 81 82  BUN 21 18  CREATININE 1.17 1.02  CALCIUM 8.9 8.8  Liver Function Tests:  Recent Labs Lab 09/30/12 0645  AST 34  ALT 26  ALKPHOS 36*  BILITOT 0.7  PROT 6.3  ALBUMIN 3.1*   No results found for this basename: LIPASE, AMYLASE,  in the last 168 hours No results found for this basename: AMMONIA,  in the last 168 hours CBC:  Recent Labs Lab 09/29/12 1845  10/02/12 0448 10/02/12 1830 10/03/12 0445 10/03/12 1736 10/04/12 0620  WBC 4.3  < > 4.5 4.8 4.3 5.0 4.6  NEUTROABS 2.4  --   --   --   --   --   --   HGB 5.8*  < > 7.4* 8.4* 7.8* 8.6* 8.1*  HCT 18.4*  < > 23.4* 26.4* 24.8* 26.8* 25.9*  MCV 69.2*  < > 73.8* 74.4* 73.8* 74.7* 74.4*  PLT 212  < > 205 190 193 198 197  < > = values in this interval not displayed. Cardiac Enzymes: No results found for this basename:  CKTOTAL, CKMB, CKMBINDEX, TROPONINI,  in the last 168 hours BNP (last 3 results)  Recent Labs  09/30/12 0150  PROBNP 94.7   CBG:  Recent Labs Lab 09/29/12 1845 10/03/12 0740  GLUCAP 74 92    No results found for this or any previous visit (from the past 240 hour(s)).   Studies: No results found.  Scheduled Meds: . amLODipine  5 mg Oral Daily  . citalopram  10 mg Oral Daily  . pantoprazole  40 mg Oral Q1200  . Rivaroxaban  20 mg Oral QPM  . simvastatin  20 mg Oral QHS  . sodium chloride  3 mL Intravenous Q12H   Continuous Infusions: . sodium chloride        Time spent: 25 minutes    [facility] A  [facility] Pager [phone]. If 7PM-7AM, please contact night-coverage at [email or web address], password [facility] 10/04/2012, 11:44 AM  LOS: 5 days

## 2012-10-05 DIAGNOSIS — I669 Occlusion and stenosis of unspecified cerebral artery: Secondary | ICD-10-CM

## 2012-10-05 LAB — COMPREHENSIVE METABOLIC PANEL
AST: 28 U/L (ref 0–37)
Albumin: 3.2 g/dL — ABNORMAL LOW (ref 3.5–5.2)
Alkaline Phosphatase: 46 U/L (ref 39–117)
BUN: 16 mg/dL (ref 6–23)
Chloride: 104 mEq/L (ref 96–112)
Potassium: 4.3 mEq/L (ref 3.5–5.1)
Total Bilirubin: 0.7 mg/dL (ref 0.3–1.2)

## 2012-10-05 LAB — CBC
HCT: 29.4 % — ABNORMAL LOW (ref 39.0–52.0)
Platelets: 218 10*3/uL (ref 150–400)
RBC: 3.95 MIL/uL — ABNORMAL LOW (ref 4.22–5.81)
RDW: 18.9 % — ABNORMAL HIGH (ref 11.5–15.5)
WBC: 4.8 10*3/uL (ref 4.0–10.5)

## 2012-10-05 MED ORDER — PANTOPRAZOLE SODIUM 40 MG PO TBEC: 40.0000 mg | DELAYED_RELEASE_TABLET | Freq: Every day | ORAL | Status: DC

## 2012-10-05 NOTE — Discharge Summary (Signed)
Physician Discharge Summary  Frank Barron OZH:[id number] DOB: 1950/08/08 DOA: 09/29/2012  PCP: [facility], [place]  Admit date: 09/29/2012 Discharge date: 10/05/2012  Time spent: 40 minutes  Recommendations for Outpatient Follow-up:  1. Followup with primary care physician within one week  Discharge Diagnoses:  Principal Problem:   Anemia Active Problems:   Aphasia as late effect of cerebrovascular accident   Left middle cerebral artery embolism   Melena   CAD (coronary artery disease)   Discharge Condition: Stable  Diet recommendation: Heart healthy  Filed Weights   09/30/12 0500 10/04/12 0522 10/05/12 0541  Weight: 101.8 kg (224 lb 6.9 oz) 101.424 kg (223 lb 9.6 oz) 100.835 kg (222 lb 4.8 oz)    History of present illness:  Frank Barron is a 62 y.o. male with Past medical history of coronary artery disease status post PCI to LAD 2 years ago, recent stroke with residue expressive aphasia, hypertension, questionable CHF with low ejection fraction as per the family.  The patient presented today with the complaint of generalized weakness. When he was trying to stand up in a restaurant he found that he was going to pass out and had to sit down. He has been having tiredness with worsening shortness of breath on exertion since lost a few days. 3 weeks ago he had an episode of dark color melena but continued on and off. He was recently started on rivaroxaban for atrial fibrillation, after failing Coumadin. Since then he has been having on and off blood in her bowel movements. Patient also felt palpitation. Denies any chest pain, cough, fever, or diarrhea, bleeding in either stomal orthopnea, PND.  Hospital Course:   1. GI bleed: Obscure GI bleed, patient came in with melanotic stools, he is on Xarelto for secondary prevention of CVA, at the time of admission to the hospital with GI was consulted, EGD and colonoscopy were done on the 7/20th and did not show any source of  bleeding. Capsule endoscopy was done and preliminary reading by Dr. Leone Payor showed no evidence of bleeding as well. Dr. Leone Payor recommended to restart patient's Xarelto, Xarelto was restarted and patient was monitored in the hospital for 2 days, his hemoglobin actually went up from 8.1 to 9.1. Patient placed on Protonix daily, asked to take OTC MVI.  2. Anemia: Likely anemia of acute hemorrhage secondary to GI bleed, but patient probably has baseline of chronic anemia, last records we have in 2008 showed hemoglobin baseline around 9.5-10. Patient presented to the hospital with hemoglobin of 8.5, had transfusion of 4 units of packed RBCs. Hemoglobin on discharge day is 9.1.  3. Atrial fibrillation: Patient is on rate control policy, is on Xarelto for anticoagulation, patient has history of CVA. Noel Christmas was stopped at the time of admission to the hospital but restarted 2 days prior to discharge patient tolerated them very well without any melena.  4. History of CAD: Patient is on aspirin and Xarelto as well as beta blockers. On discharge aspirin was discontinued, primary care physician can restart, on followup.  5. History of CVA: With some residual expressive aphasia, patient is on Xarelto.  Procedures:  EGD done by Dr. Arlyce Dice on 10/01/2012 showed normal EGD.  Colonoscopy done by Dr. Arlyce Dice on 720 1214 showed no abnormalities.  Capsule endoscopy  Preliminary reading by Dr. Leone Payor showed no evidence of bleeding.  Transfusion of 4 units of packed RBCs.  Consultations:  [facility]  Discharge Exam: Filed Vitals:   10/04/12 1344 10/04/12 2114 10/05/12 0541  10/05/12 1024  BP: 120/79 121/85 107/84 102/68  Pulse: 64 79 77   Temp: 98.4 F (36.9 C) 98.6 F (37 C) 98.5 F (36.9 C)   TempSrc:  Oral Oral   Resp: 16 18 18    Height:      Weight:   100.835 kg (222 lb 4.8 oz)   SpO2: 100% 100% 98%    General: Alert and awake, oriented x3, not in any acute distress. HEENT: anicteric sclera,  pupils reactive to light and accommodation, EOMI CVS: S1-S2 clear, no murmur rubs or gallops Chest: clear to auscultation bilaterally, no wheezing, rales or rhonchi Abdomen: soft nontender, nondistended, normal bowel sounds, no organomegaly Extremities: no cyanosis, clubbing or edema noted bilaterally Neuro: Cranial nerves II-XII intact, no focal neurological deficits  Discharge Instructions  Discharge Orders   Future Orders Complete By Expires     Diet - low sodium heart healthy  As directed     Increase activity slowly  As directed         Medication List         amLODipine 10 MG tablet  Commonly known as:  NORVASC  Take 5 mg by mouth daily.     aspirin EC 81 MG tablet  Take 81 mg by mouth daily.     cholecalciferol 1000 UNITS tablet  Commonly known as:  VITAMIN D  Take 1,000 Units by mouth daily.     citalopram 20 MG tablet  Commonly known as:  CELEXA  Take 10 mg by mouth daily.     cyclobenzaprine 10 MG tablet  Commonly known as:  FLEXERIL  Take 5 mg by mouth 3 (three) times daily as needed for muscle spasms.     pantoprazole 40 MG tablet  Commonly known as:  PROTONIX  Take 1 tablet (40 mg total) by mouth daily at 12 noon.     simvastatin 40 MG tablet  Commonly known as:  ZOCOR  Take 20 mg by mouth at bedtime.     XARELTO 20 MG Tabs  Generic drug:  Rivaroxaban  Take 20 mg by mouth every evening.       No Known Allergies    The results of significant diagnostics from this hospitalization (including imaging, microbiology, ancillary and laboratory) are listed below for reference.    Significant Diagnostic Studies: Portable Chest 1 View  09/30/2012   *RADIOLOGY REPORT*  Clinical Data: Congestive heart failure, short of breath  PORTABLE CHEST - 1 VIEW  Comparison: Chest radiograph 10/15/ 2008  Findings: Normal mediastinum and heart silhouette.  Costophrenic angles are clear.  No effusion, infiltrate, or pneumothorax.  Low lung volumes.  IMPRESSION: Low lung  volumes.  No acute findings.   Original Report Authenticated By: Genevive Bi, M.D.    Microbiology: No results found for this or any previous visit (from the past 240 hour(s)).   Labs: Basic Metabolic Panel:  Recent Labs Lab 09/29/12 1845 09/30/12 0645 10/05/12 0550  NA 136 136 139  K 4.1 4.1 4.3  CL 104 105 104  CO2 23 20 26   GLUCOSE 81 82 96  BUN 21 18 16   CREATININE 1.17 1.02 1.16  CALCIUM 8.9 8.8 9.1   Liver Function Tests:  Recent Labs Lab 09/30/12 0645 10/05/12 0550  AST 34 28  ALT 26 25  ALKPHOS 36* 46  BILITOT 0.7 0.7  PROT 6.3 6.8  ALBUMIN 3.1* 3.2*   No results found for this basename: LIPASE, AMYLASE,  in the last 168 hours No results found  for this basename: AMMONIA,  in the last 168 hours CBC:  Recent Labs Lab 09/29/12 1845  10/02/12 1830 10/03/12 0445 10/03/12 1736 10/04/12 0620 10/05/12 0550  WBC 4.3  < > 4.8 4.3 5.0 4.6 4.8  NEUTROABS 2.4  --   --   --   --   --   --   HGB 5.8*  < > 8.4* 7.8* 8.6* 8.1* 9.1*  HCT 18.4*  < > 26.4* 24.8* 26.8* 25.9* 29.4*  MCV 69.2*  < > 74.4* 73.8* 74.7* 74.4* 74.4*  PLT 212  < > 190 193 198 197 218  < > = values in this interval not displayed. Cardiac Enzymes: No results found for this basename: CKTOTAL, CKMB, CKMBINDEX, TROPONINI,  in the last 168 hours BNP: BNP (last 3 results)  Recent Labs  09/30/12 0150  PROBNP 94.7   CBG:  Recent Labs Lab 09/29/12 1845 10/03/12 0740  GLUCAP 74 92       Signed:  , A  [facility] 10/05/2012, 1:02 PM

## 2012-10-05 NOTE — Progress Notes (Signed)
Pt's heart rhythm changed to Afib while he is sleeping. Hr in the 70's. EKG obtained. Np on call made aware. No new orders received. Will cont to monitor pt.

## 2013-12-26 HISTORY — PX: NEPHRECTOMY: SHX65

## 2014-02-02 ENCOUNTER — Emergency Department (HOSPITAL_COMMUNITY): Payer: Non-veteran care

## 2014-02-02 ENCOUNTER — Encounter (HOSPITAL_COMMUNITY): Payer: Self-pay

## 2014-02-02 ENCOUNTER — Inpatient Hospital Stay (HOSPITAL_COMMUNITY)
Admission: EM | Admit: 2014-02-02 | Discharge: 2014-02-07 | DRG: 682 | Disposition: A | Discharge status: Home / Self Care | Payer: Non-veteran care | Attending: Internal Medicine | Admitting: Internal Medicine

## 2014-02-02 DIAGNOSIS — I82409 Acute embolism and thrombosis of unspecified deep veins of unspecified lower extremity: Secondary | ICD-10-CM

## 2014-02-02 DIAGNOSIS — Z79899 Other long term (current) drug therapy: Secondary | ICD-10-CM

## 2014-02-02 DIAGNOSIS — Z955 Presence of coronary angioplasty implant and graft: Secondary | ICD-10-CM

## 2014-02-02 DIAGNOSIS — K760 Fatty (change of) liver, not elsewhere classified: Secondary | ICD-10-CM | POA: Diagnosis present

## 2014-02-02 DIAGNOSIS — G92 Toxic encephalopathy: Secondary | ICD-10-CM | POA: Diagnosis present

## 2014-02-02 DIAGNOSIS — Z86718 Personal history of other venous thrombosis and embolism: Secondary | ICD-10-CM

## 2014-02-02 DIAGNOSIS — Z85528 Personal history of other malignant neoplasm of kidney: Secondary | ICD-10-CM

## 2014-02-02 DIAGNOSIS — B1920 Unspecified viral hepatitis C without hepatic coma: Secondary | ICD-10-CM | POA: Diagnosis present

## 2014-02-02 DIAGNOSIS — I10 Essential (primary) hypertension: Secondary | ICD-10-CM

## 2014-02-02 DIAGNOSIS — R1030 Lower abdominal pain, unspecified: Secondary | ICD-10-CM

## 2014-02-02 DIAGNOSIS — Z96642 Presence of left artificial hip joint: Secondary | ICD-10-CM | POA: Diagnosis present

## 2014-02-02 DIAGNOSIS — R339 Retention of urine, unspecified: Secondary | ICD-10-CM | POA: Insufficient documentation

## 2014-02-02 DIAGNOSIS — G9340 Encephalopathy, unspecified: Secondary | ICD-10-CM

## 2014-02-02 DIAGNOSIS — E871 Hypo-osmolality and hyponatremia: Secondary | ICD-10-CM | POA: Diagnosis present

## 2014-02-02 DIAGNOSIS — R4182 Altered mental status, unspecified: Secondary | ICD-10-CM

## 2014-02-02 DIAGNOSIS — Z7901 Long term (current) use of anticoagulants: Secondary | ICD-10-CM

## 2014-02-02 DIAGNOSIS — Z905 Acquired absence of kidney: Secondary | ICD-10-CM | POA: Diagnosis present

## 2014-02-02 DIAGNOSIS — N179 Acute kidney failure, unspecified: Principal | ICD-10-CM

## 2014-02-02 DIAGNOSIS — R410 Disorientation, unspecified: Secondary | ICD-10-CM

## 2014-02-02 DIAGNOSIS — I509 Heart failure, unspecified: Secondary | ICD-10-CM | POA: Diagnosis present

## 2014-02-02 DIAGNOSIS — I2510 Atherosclerotic heart disease of native coronary artery without angina pectoris: Secondary | ICD-10-CM | POA: Diagnosis present

## 2014-02-02 DIAGNOSIS — E780 Pure hypercholesterolemia: Secondary | ICD-10-CM | POA: Diagnosis present

## 2014-02-02 DIAGNOSIS — F419 Anxiety disorder, unspecified: Secondary | ICD-10-CM | POA: Diagnosis present

## 2014-02-02 DIAGNOSIS — Z8673 Personal history of transient ischemic attack (TIA), and cerebral infarction without residual deficits: Secondary | ICD-10-CM

## 2014-02-02 DIAGNOSIS — F1410 Cocaine abuse, uncomplicated: Secondary | ICD-10-CM | POA: Insufficient documentation

## 2014-02-02 DIAGNOSIS — R4701 Aphasia: Secondary | ICD-10-CM | POA: Diagnosis present

## 2014-02-02 DIAGNOSIS — F329 Major depressive disorder, single episode, unspecified: Secondary | ICD-10-CM | POA: Diagnosis present

## 2014-02-02 DIAGNOSIS — R945 Abnormal results of liver function studies: Secondary | ICD-10-CM

## 2014-02-02 HISTORY — DX: Malignant neoplasm of unspecified kidney, except renal pelvis: C64.9

## 2014-02-02 LAB — CBC WITH DIFFERENTIAL/PLATELET
BASOS PCT: 0 % (ref 0–1)
Basophils Absolute: 0 10*3/uL (ref 0.0–0.1)
Eosinophils Absolute: 0 10*3/uL (ref 0.0–0.7)
Eosinophils Relative: 0 % (ref 0–5)
HEMATOCRIT: 33.4 % — AB (ref 39.0–52.0)
HEMOGLOBIN: 11.2 g/dL — AB (ref 13.0–17.0)
LYMPHS ABS: 0.6 10*3/uL — AB (ref 0.7–4.0)
LYMPHS PCT: 14 % (ref 12–46)
MCH: 28.6 pg (ref 26.0–34.0)
MCHC: 33.5 g/dL (ref 30.0–36.0)
MCV: 85.2 fL (ref 78.0–100.0)
MONO ABS: 0.5 10*3/uL (ref 0.1–1.0)
MONOS PCT: 10 % (ref 3–12)
NEUTROS ABS: 3.3 10*3/uL (ref 1.7–7.7)
NEUTROS PCT: 76 % (ref 43–77)
Platelets: 149 10*3/uL — ABNORMAL LOW (ref 150–400)
RBC: 3.92 MIL/uL — AB (ref 4.22–5.81)
RDW: 15.5 % (ref 11.5–15.5)
WBC: 4.4 10*3/uL (ref 4.0–10.5)

## 2014-02-02 LAB — COMPREHENSIVE METABOLIC PANEL
ALBUMIN: 3.9 g/dL (ref 3.5–5.2)
ALK PHOS: 56 U/L (ref 39–117)
ALT: 51 U/L (ref 0–53)
ANION GAP: 18 — AB (ref 5–15)
AST: 64 U/L — ABNORMAL HIGH (ref 0–37)
BILIRUBIN TOTAL: 0.8 mg/dL (ref 0.3–1.2)
BUN: 24 mg/dL — AB (ref 6–23)
CHLORIDE: 97 meq/L (ref 96–112)
CO2: 21 meq/L (ref 19–32)
CREATININE: 1.64 mg/dL — AB (ref 0.50–1.35)
Calcium: 9.5 mg/dL (ref 8.4–10.5)
GFR calc Af Amer: 50 mL/min — ABNORMAL LOW (ref >90)
GFR, EST NON AFRICAN AMERICAN: 43 mL/min — AB (ref >90)
GLUCOSE: 88 mg/dL (ref 70–99)
POTASSIUM: 4.6 meq/L (ref 3.7–5.3)
Sodium: 136 mEq/L — ABNORMAL LOW (ref 137–147)
Total Protein: 8.4 g/dL — ABNORMAL HIGH (ref 6.0–8.3)

## 2014-02-02 LAB — URINALYSIS, ROUTINE W REFLEX MICROSCOPIC
Bilirubin Urine: NEGATIVE
GLUCOSE, UA: NEGATIVE mg/dL
KETONES UR: NEGATIVE mg/dL
LEUKOCYTES UA: NEGATIVE
Nitrite: NEGATIVE
PH: 6.5 (ref 5.0–8.0)
Protein, ur: NEGATIVE mg/dL
Specific Gravity, Urine: 1.013 (ref 1.005–1.030)
Urobilinogen, UA: 1 mg/dL (ref 0.0–1.0)

## 2014-02-02 LAB — RAPID URINE DRUG SCREEN, HOSP PERFORMED
AMPHETAMINES: NOT DETECTED
BENZODIAZEPINES: NOT DETECTED
Barbiturates: NOT DETECTED
Cocaine: POSITIVE — AB
OPIATES: NOT DETECTED
TETRAHYDROCANNABINOL: NOT DETECTED

## 2014-02-02 LAB — AMMONIA: AMMONIA: 32 umol/L (ref 11–60)

## 2014-02-02 LAB — TROPONIN I: Troponin I: <0.3 ng/mL (ref <0.30)

## 2014-02-02 LAB — URINE MICROSCOPIC-ADD ON

## 2014-02-02 LAB — PROTIME-INR
INR: 1.48 (ref 0.00–1.49)
PROTHROMBIN TIME: 18.1 s — AB (ref 11.6–15.2)

## 2014-02-02 LAB — I-STAT CG4 LACTIC ACID, ED: LACTIC ACID, VENOUS: 3.2 mmol/L — AB (ref 0.5–2.2)

## 2014-02-02 LAB — APTT: aPTT: 33 seconds (ref 24–37)

## 2014-02-02 LAB — ETHANOL: Alcohol, Ethyl (B): <11 mg/dL (ref 0–11)

## 2014-02-02 MED ORDER — LORAZEPAM 2 MG/ML IJ SOLN
1.0000 mg | Freq: Once | INTRAMUSCULAR | Status: AC
  Administered 2014-02-02: 1 mg via INTRAVENOUS
  Filled 2014-02-02: qty 1

## 2014-02-02 MED ORDER — HYDROMORPHONE HCL 1 MG/ML IJ SOLN
1.0000 mg | Freq: Once | INTRAMUSCULAR | Status: AC
  Administered 2014-02-02: 1 mg via INTRAVENOUS

## 2014-02-02 MED ORDER — LIDOCAINE HCL 2 % EX GEL
1.0000 "application " | Freq: Once | CUTANEOUS | Status: AC
  Administered 2014-02-02: 1 via TOPICAL
  Filled 2014-02-02: qty 20

## 2014-02-02 MED ORDER — SODIUM CHLORIDE 0.9 % IV SOLN
INTRAVENOUS | Status: AC
  Administered 2014-02-02: 23:00:00 via INTRAVENOUS

## 2014-02-02 MED ORDER — HYDRALAZINE HCL 20 MG/ML IJ SOLN: 10.0000 mg | Freq: Four times a day (QID) | INTRAMUSCULAR | Status: DC | PRN

## 2014-02-02 MED ORDER — SODIUM CHLORIDE 0.9 % IV SOLN
Freq: Once | INTRAVENOUS | Status: AC
  Administered 2014-02-02: 100 mL/h via INTRAVENOUS

## 2014-02-02 MED ORDER — SIMVASTATIN 20 MG PO TABS
20.0000 mg | ORAL_TABLET | Freq: Every day | ORAL | Status: DC
  Administered 2014-02-03 – 2014-02-06 (×4): 20 mg via ORAL
  Filled 2014-02-02 (×7): qty 1

## 2014-02-02 MED ORDER — AMLODIPINE BESYLATE 5 MG PO TABS
5.0000 mg | ORAL_TABLET | Freq: Every day | ORAL | Status: DC
  Administered 2014-02-03: 5 mg via ORAL
  Filled 2014-02-02 (×2): qty 1

## 2014-02-02 MED ORDER — RIVAROXABAN 20 MG PO TABS
20.0000 mg | ORAL_TABLET | Freq: Every evening | ORAL | Status: DC
  Administered 2014-02-03: 20 mg via ORAL
  Filled 2014-02-02 (×4): qty 1

## 2014-02-02 MED ORDER — CYCLOBENZAPRINE HCL 10 MG PO TABS: 5.0000 mg | ORAL_TABLET | Freq: Three times a day (TID) | ORAL | Status: DC | PRN

## 2014-02-02 MED ORDER — HYDROMORPHONE HCL 1 MG/ML IJ SOLN
1.0000 mg | Freq: Once | INTRAMUSCULAR | Status: AC
  Administered 2014-02-02: 1 mg via INTRAVENOUS
  Filled 2014-02-02: qty 1

## 2014-02-02 MED ORDER — PANTOPRAZOLE SODIUM 40 MG PO TBEC
40.0000 mg | DELAYED_RELEASE_TABLET | Freq: Every day | ORAL | Status: DC
  Administered 2014-02-03 – 2014-02-07 (×5): 40 mg via ORAL
  Filled 2014-02-02 (×5): qty 1

## 2014-02-02 MED ORDER — HYDROMORPHONE HCL 1 MG/ML IJ SOLN
INTRAMUSCULAR | Status: AC
  Filled 2014-02-02: qty 1

## 2014-02-02 MED ORDER — ACETAMINOPHEN 325 MG PO TABS
650.0000 mg | ORAL_TABLET | Freq: Four times a day (QID) | ORAL | Status: DC | PRN
Start: 2014-02-02 — End: 2014-02-07

## 2014-02-02 MED ORDER — CITALOPRAM HYDROBROMIDE 10 MG PO TABS
10.0000 mg | ORAL_TABLET | Freq: Every day | ORAL | Status: DC
  Administered 2014-02-03 – 2014-02-07 (×5): 10 mg via ORAL
  Filled 2014-02-02 (×5): qty 1

## 2014-02-02 MED ORDER — VITAMIN D3 25 MCG (1000 UNIT) PO TABS
1000.0000 [IU] | ORAL_TABLET | Freq: Every day | ORAL | Status: DC
  Administered 2014-02-03 – 2014-02-07 (×5): 1000 [IU] via ORAL
  Filled 2014-02-02 (×5): qty 1

## 2014-02-02 MED ORDER — CLONIDINE HCL 0.1 MG PO TABS
0.1000 mg | ORAL_TABLET | Freq: Three times a day (TID) | ORAL | Status: DC
  Administered 2014-02-03 – 2014-02-06 (×10): 0.1 mg via ORAL
  Filled 2014-02-02 (×16): qty 1

## 2014-02-02 MED ORDER — SODIUM CHLORIDE 0.9 % IJ SOLN
3.0000 mL | Freq: Two times a day (BID) | INTRAMUSCULAR | Status: DC
  Administered 2014-02-02 – 2014-02-07 (×8): 3 mL via INTRAVENOUS

## 2014-02-02 MED ORDER — ACETAMINOPHEN 650 MG RE SUPP: 650.0000 mg | Freq: Four times a day (QID) | RECTAL | Status: DC | PRN

## 2014-02-02 MED ORDER — LORAZEPAM 2 MG/ML IJ SOLN
2.0000 mg | Freq: Once | INTRAMUSCULAR | Status: AC
  Administered 2014-02-02: 2 mg via INTRAVENOUS
  Filled 2014-02-02: qty 1

## 2014-02-02 MED ORDER — FENTANYL CITRATE 0.05 MG/ML IJ SOLN
75.0000 ug | Freq: Once | INTRAMUSCULAR | Status: AC
  Administered 2014-02-02: 75 ug via INTRAVENOUS
  Filled 2014-02-02: qty 2

## 2014-02-02 NOTE — Consult Note (Signed)
Consult Reason for Consult:altered mental status Referring Physician: Dr Reather Converse [facility] ED  CC: AMS  HPI: Frank Barron is an 63 y.o. male  with history of expressive aphasia from stroke, anemia, coronary artery disease, DVT, on Xarelto, anxiety, drug abuse with cocaine presents with altered mental status. Patient right kidney removed 12/26/13 for localized kidney cancer, only new medication narcotic (oxycodone). Patient went out last night at 5 PM last seen normal self and his wife saw him at noon today with altered mental status, diaphoresis. Reported that he was using cocaine all night long. Patient has used cocaine in the past but this was more significant amount of time and amount per report. No known head injury or any details of the evening. No fevers or recent infection. Patient has hepatitis C.  Wife notes that while in the ED the patient has been able to interact with her appropriately. His main concern has been difficulty urinating.   Patient noted to be hypertensive and in ARF in the ED. CT head shows old L MCA infarct, no acute process. While in the ED he has received fentanyl 55mcg, dilaudid 1mg  and ativan 1mg  x 3 doses.   Of note he is on xarelto at home for history of DVT.   Past Medical History  Diagnosis Date  . Hypertension   . Anxiety   . CHF (congestive heart failure)   . Anemia   . Stroke 11/18/2011    [name] 07/31/2012; expressive aphasia noted on 09/30/2012  . High cholesterol   . History of DVT of lower extremity     "left; long time ago" (09/30/2012)  . Depression     [name] 07/19/2005 (09/30/2012)  . Hepatitis C     pt denies this hx on 09/30/2012  . CAD (coronary artery disease)     PCI to LAD 2012  . Renal cell carcinoma     Past Surgical History  Procedure Laterality Date  . Joint replacement  01/03/2007  . Coronary angioplasty with stent placement    . Total hip arthroplasty Left 01/03/2007    [name] 01/03/2007 (09/30/2012)  . Lumbar disc surgery  2001   herniated disc/notes 07/19/2005 (09/30/2012)  . Eye surgery Right 1975    [name] 07/19/2005; "injured playing football" (09/29/2012)  . Colonoscopy N/A 10/01/2012    Procedure: COLONOSCOPY;  Surgeon: Inda Castle, MD;  Location: [facility];  Service: Endoscopy;  Laterality: N/A;  . Esophagogastroduodenoscopy N/A 10/01/2012    Procedure: ESOPHAGOGASTRODUODENOSCOPY (EGD);  Surgeon: Inda Castle, MD;  Location: [facility];  Service: Endoscopy;  Laterality: N/A;  . Givens capsule study N/A 10/02/2012    Procedure: GIVENS CAPSULE STUDY;  Surgeon: Inda Castle, MD;  Location: [facility];  Service: Endoscopy;  Laterality: N/A;  . Nephrectomy  12/26/2013    Laparascopic , [facility]    Family History  Problem Relation Age of Onset  . Hypertension Mother   . Deep vein thrombosis Mother     Social History:  reports that he has never smoked. He has never used smokeless tobacco. He reports that he drinks alcohol. He reports that he uses illicit drugs ("Crack" cocaine and Cocaine) about once per week.  No Known Allergies  Medications:  Scheduled: . lidocaine  1 application Topical Once     ROS: Out of a complete 14 system review, the patient complains of only the following symptoms, and all other reviewed systems are negative. +lethargy, difficulty urinating, confusion  Physical Examination: Filed Vitals:   02/02/14 1745  BP: 145/86  Pulse:   Temp:   Resp:    Physical Exam  Constitutional: NAD, resting comfortably Psych: minimal interaction Eyes: No scleral injection HENT: No OP obstrucion Head: Normocephalic.  Cardiovascular: Normal rate and regular rhythm.  Respiratory: Effort normal and breath sounds normal.  GI: Soft. Bowel sounds are normal. No distension. There is no tenderness.  Skin: WDI  Neurologic Examination Mental Status: Lethargic, requires frequent verbal stimulation to keep awake, not answering questions, not following commands. Mumbles response  but unclear what his response is Cranial Nerves: II: unable to visualize fundi, blinks to threat bilat, pupils equal, minimal reactivity right pupil (appears surgical), 22mm reactive left pupil III,IV, VI: eyes midline, no gaze preference, will minimally track examiner V,VII: smile symmetric, facial light touch sensation normal bilaterally VIII: hearing normal bilaterally IX,X: gag reflex present XI: trapezius strength/neck flexion strength normal bilaterally XII: tongue strength normal  Motor: Due to mental status unable to formally test, appears to lift all extremities symmetrically against gravity and light resistance Tone appears increased on RUE Sensory: withdrawals to noxious stimuli in all extremities Deep Tendon Reflexes: 2+ and symmetric throughout Plantars: Right: equiovcal   Left: downgoing Cerebellar: Unable to assess due to mental status Gait: unable to assess due to mental status  Laboratory Studies:   Basic Metabolic Panel:  Recent Labs Lab 02/02/14 1610  NA 136*  K 4.6  CL 97  CO2 21  GLUCOSE 88  BUN 24*  CREATININE 1.64*  CALCIUM 9.5    Liver Function Tests:  Recent Labs Lab 02/02/14 1610  AST 64*  ALT 51  ALKPHOS 56  BILITOT 0.8  PROT 8.4*  ALBUMIN 3.9   No results for input(s): LIPASE, AMYLASE in the last 168 hours.  Recent Labs Lab 02/02/14 1446  AMMONIA 32    CBC: No results for input(s): WBC, NEUTROABS, HGB, HCT, MCV, PLT in the last 168 hours.  Cardiac Enzymes:  Recent Labs Lab 02/02/14 1610  TROPONINI <0.30    BNP: Invalid input(s): POCBNP  CBG: No results for input(s): GLUCAP in the last 168 hours.  Microbiology: No results found for this or any previous visit.  Coagulation Studies:  Recent Labs  02/02/14 1446  LABPROT 18.1*  INR 1.48    Urinalysis:  Recent Labs Lab 02/02/14 1511  COLORURINE YELLOW  LABSPEC 1.013  PHURINE 6.5  GLUCOSEU NEGATIVE  HGBUR TRACE*  BILIRUBINUR NEGATIVE  KETONESUR NEGATIVE   PROTEINUR NEGATIVE  UROBILINOGEN 1.0  NITRITE NEGATIVE  LEUKOCYTESUR NEGATIVE    Lipid Panel:  No results found for: CHOL, TRIG, HDL, CHOLHDL, VLDL, LDLCALC  HgbA1C: No results found for: HGBA1C  Urine Drug Screen:     Component Value Date/Time   LABOPIA NONE DETECTED 02/02/2014 1511   COCAINSCRNUR POSITIVE* 02/02/2014 1511   LABBENZ NONE DETECTED 02/02/2014 1511   AMPHETMU NONE DETECTED 02/02/2014 1511   THCU NONE DETECTED 02/02/2014 1511   LABBARB NONE DETECTED 02/02/2014 1511    Alcohol Level:  Recent Labs Lab 02/02/14 1610  ETH <11    Other results:  Imaging: Dg Chest Port 1 View  02/02/2014   CLINICAL DATA:  Altered mental status.  Severe lower abdominal pain.  EXAM: PORTABLE CHEST - 1 VIEW  COMPARISON:  Radiograph 09/30/2012  FINDINGS: Decubitus view was performed because patient would not allow to be positioned in standard position.  Normal cardiac silhouette. No pleural fluid, pulmonary edema, or pneumothorax. No osseous abnormality.  IMPRESSION: No acute cardiopulmonary findings on decubitus view   Electronically Signed  By: Suzy Bouchard M.D.   On: 02/02/2014 17:24     Assessment/Plan:  63y/o gentleman with hx of prior L MCA infarct and polysubstance abuse presenting with AMS. Found to be cocaine + and in ARF in the ED. Suspect his encephalopathy is likely multifactorial and related to cocaine usage, ARF, elevated BP. Cocaine use raises concern over possible new infarct. Extensive prior infarct puts him at higher risk of seizure/post-ictal state which would also be in the differential though per spouse the patient has remained interactive during this episode.   1) Encephalopathy: likely multifactorial and related to cocaine use, HTN and ARF, have lower suspicion for new infarct or seizure/post-ictal state. No signs of infectious etiology  -would monitor overnight. If no improvement in mental status would check MRI brain in the morning -ARF and hypertension  management per primary team -will follow    Jim Like, DO [facility] [phone]  If 7pm- 7am, please page neurology on call as listed in [place]. 02/02/2014, 7:16 PM

## 2014-02-02 NOTE — ED Notes (Signed)
Attempted report 

## 2014-02-02 NOTE — ED Notes (Addendum)
Unable to send pt to CT or perform X-ray b/c pt will not lay still.  Continues to state he has to urinate and that "it hurts", pointing to the suprapubic area.  Previously incontinent of urine.  Pt unable to urinate in sitting or standing position.  Dr Reather Converse notified and stated ok to in and out pt.

## 2014-02-02 NOTE — ED Notes (Signed)
Lactic acid reported to Dr. Kathrynn Humble

## 2014-02-02 NOTE — ED Notes (Signed)
Attempted to in-and-out pt x 2 with no success.  Some blood to catheter.  Bladder scan showed 54 ml's urine.  PT continues to c/o pain.  Dr Reather Converse to be notified.

## 2014-02-02 NOTE — ED Notes (Signed)
Pt still in a lot of pain and not able to lay still for CT scan.  Brenda-RN speaking to doctor and will let us know when patient is ready for CT scan.

## 2014-02-02 NOTE — ED Notes (Signed)
CT called after ativan and pain meds administered.

## 2014-02-02 NOTE — H&P (Signed)
Frank Barron is an 63 y.o. male.    Pcp:  [facility]  Chief Complaint: confused HPI: 63 yo male with hx of Hepatitis C, CHF (unknown EF), CAD , CVA was found today at noon to be confused by family.  Pt had some difficulty expressing himself.  But has baseline expressive aphasia.  Pt admitted to having used cocaine.  No hx of head trauma.  Pt was brought to ED for evaluation and found to be positive for cocaine.  Pts family member wonders if due to oxycodone that pt was give for recent nephrectomy vs cocaine use.  CT brain pending,   Neurology has been consulted to evaluate the patient.   Pt was also noted to be in acute renal failure and be hypertensive.  Pt will be admitted for altered mental status.   Past Medical History  Diagnosis Date  . Hypertension   . Anxiety   . CHF (congestive heart failure)   . Anemia   . Stroke 11/18/2011    [name] 07/31/2012; expressive aphasia noted on 09/30/2012  . High cholesterol   . History of DVT of lower extremity     "left; long time ago" (09/30/2012)  . Depression     [name] 07/19/2005 (09/30/2012)  . Hepatitis C     pt denies this hx on 09/30/2012  . CAD (coronary artery disease)     PCI to LAD 2012  . Renal cell carcinoma     Past Surgical History  Procedure Laterality Date  . Joint replacement  01/03/2007  . Coronary angioplasty with stent placement    . Total hip arthroplasty Left 01/03/2007    [name] 01/03/2007 (09/30/2012)  . Lumbar disc surgery  2001    herniated disc/notes 07/19/2005 (09/30/2012)  . Eye surgery Right 1975    [name] 07/19/2005; "injured playing football" (09/29/2012)  . Colonoscopy N/A 10/01/2012    Procedure: COLONOSCOPY;  Surgeon: Inda Castle, MD;  Location: [place];  Service: Endoscopy;  Laterality: N/A;  . Esophagogastroduodenoscopy N/A 10/01/2012    Procedure: ESOPHAGOGASTRODUODENOSCOPY (EGD);  Surgeon: Inda Castle, MD;  Location: [place];  Service: Endoscopy;  Laterality: N/A;  . Givens capsule  study N/A 10/02/2012    Procedure: GIVENS CAPSULE STUDY;  Surgeon: Inda Castle, MD;  Location: [place];  Service: Endoscopy;  Laterality: N/A;  . Nephrectomy  12/26/2013    Laparascopic , [facility]    Family History  Problem Relation Age of Onset  . Hypertension Mother   . Deep vein thrombosis Mother    Social History:  reports that he has never smoked. He has never used smokeless tobacco. He reports that he drinks alcohol. He reports that he uses illicit drugs ("Crack" cocaine and Cocaine) about once per week.  Allergies: No Known Allergies   (Not in a hospital admission)  Results for orders placed or performed during the hospital encounter of 02/02/14 (from the past 48 hour(s))  APTT     Status: None   Collection Time: 02/02/14  2:46 PM  Result Value Ref Range   aPTT 33 24 - 37 seconds  Protime-INR     Status: Abnormal   Collection Time: 02/02/14  2:46 PM  Result Value Ref Range   Prothrombin Time 18.1 (H) 11.6 - 15.2 seconds   INR 1.48 0.00 - 1.49  Ammonia     Status: None   Collection Time: 02/02/14  2:46 PM  Result Value Ref Range   Ammonia 32 11 - 60 umol/L  I-Stat  CG4 Lactic Acid, ED     Status: Abnormal   Collection Time: 02/02/14  3:02 PM  Result Value Ref Range   Lactic Acid, Venous 3.20 (H) 0.5 - 2.2 mmol/L  Urinalysis, Routine w reflex microscopic     Status: Abnormal   Collection Time: 02/02/14  3:11 PM  Result Value Ref Range   Color, Urine YELLOW YELLOW   APPearance CLEAR CLEAR   Specific Gravity, Urine 1.013 1.005 - 1.030   pH 6.5 5.0 - 8.0   Glucose, UA NEGATIVE NEGATIVE mg/dL   Hgb urine dipstick TRACE (A) NEGATIVE   Bilirubin Urine NEGATIVE NEGATIVE   Ketones, ur NEGATIVE NEGATIVE mg/dL   Protein, ur NEGATIVE NEGATIVE mg/dL   Urobilinogen, UA 1.0 0.0 - 1.0 mg/dL   Nitrite NEGATIVE NEGATIVE   Leukocytes, UA NEGATIVE NEGATIVE  Urine rapid drug screen (hosp performed)     Status: Abnormal   Collection Time: 02/02/14  3:11 PM  Result  Value Ref Range   Opiates NONE DETECTED NONE DETECTED   Cocaine POSITIVE (A) NONE DETECTED   Benzodiazepines NONE DETECTED NONE DETECTED   Amphetamines NONE DETECTED NONE DETECTED   Tetrahydrocannabinol NONE DETECTED NONE DETECTED   Barbiturates NONE DETECTED NONE DETECTED    Comment:        DRUG SCREEN FOR MEDICAL PURPOSES ONLY.  IF CONFIRMATION IS NEEDED FOR ANY PURPOSE, NOTIFY LAB WITHIN 5 DAYS.        LOWEST DETECTABLE LIMITS FOR URINE DRUG SCREEN Drug Class       Cutoff (ng/mL) Amphetamine      1000 Barbiturate      200 Benzodiazepine   270 Tricyclics       786 Opiates          300 Cocaine          300 THC              50   Urine microscopic-add on     Status: None   Collection Time: 02/02/14  3:11 PM  Result Value Ref Range   Squamous Epithelial / LPF RARE RARE   RBC / HPF 0-2 <3 RBC/hpf  Comprehensive metabolic panel     Status: Abnormal   Collection Time: 02/02/14  4:10 PM  Result Value Ref Range   Sodium 136 (L) 137 - 147 mEq/L   Potassium 4.6 3.7 - 5.3 mEq/L   Chloride 97 96 - 112 mEq/L   CO2 21 19 - 32 mEq/L   Glucose, Bld 88 70 - 99 mg/dL   BUN 24 (H) 6 - 23 mg/dL   Creatinine, Ser 1.64 (H) 0.50 - 1.35 mg/dL   Calcium 9.5 8.4 - 10.5 mg/dL   Total Protein 8.4 (H) 6.0 - 8.3 g/dL   Albumin 3.9 3.5 - 5.2 g/dL   AST 64 (H) 0 - 37 U/L   ALT 51 0 - 53 U/L   Alkaline Phosphatase 56 39 - 117 U/L   Total Bilirubin 0.8 0.3 - 1.2 mg/dL   GFR calc non Af Amer 43 (L) >90 mL/min   GFR calc Af Amer 50 (L) >90 mL/min    Comment: (NOTE) The eGFR has been calculated using the CKD EPI equation. This calculation has not been validated in all clinical situations. eGFR's persistently <90 mL/min signify possible Chronic Kidney Disease.    Anion gap 18 (H) 5 - 15  Ethanol     Status: None   Collection Time: 02/02/14  4:10 PM  Result Value Ref Range  Alcohol, Ethyl (B) <11 0 - 11 mg/dL    Comment:        LOWEST DETECTABLE LIMIT FOR SERUM ALCOHOL IS 11 mg/dL FOR  MEDICAL PURPOSES ONLY   Troponin I     Status: None   Collection Time: 02/02/14  4:10 PM  Result Value Ref Range   Troponin I <0.30 <0.30 ng/mL    Comment:        Due to the release kinetics of cTnI, a negative result within the first hours of the onset of symptoms does not rule out myocardial infarction with certainty. If myocardial infarction is still suspected, repeat the test at appropriate intervals.    Dg Chest Port 1 View  02/02/2014   CLINICAL DATA:  Altered mental status.  Severe lower abdominal pain.  EXAM: PORTABLE CHEST - 1 VIEW  COMPARISON:  Radiograph 09/30/2012  FINDINGS: Decubitus view was performed because patient would not allow to be positioned in standard position.  Normal cardiac silhouette. No pleural fluid, pulmonary edema, or pneumothorax. No osseous abnormality.  IMPRESSION: No acute cardiopulmonary findings on decubitus view   Electronically Signed   By: Suzy Bouchard M.D.   On: 02/02/2014 17:24    Review of Systems  Constitutional: Negative for fever, chills, weight loss, malaise/fatigue and diaphoresis.  HENT: Negative for congestion, ear discharge, ear pain, hearing loss, nosebleeds, sore throat and tinnitus.   Eyes: Negative for blurred vision, double vision, photophobia, pain, discharge and redness.  Respiratory: Negative for cough, hemoptysis, sputum production, shortness of breath, wheezing and stridor.   Cardiovascular: Negative for chest pain, palpitations, orthopnea, claudication, leg swelling and PND.  Gastrointestinal: Negative for heartburn, nausea, vomiting, abdominal pain, diarrhea, constipation, blood in stool and melena.  Genitourinary: Negative for dysuria, urgency, frequency, hematuria and flank pain.  Musculoskeletal: Negative for myalgias, back pain, joint pain, falls and neck pain.  Skin: Negative for itching and rash.  Neurological: Negative for dizziness, tingling, tremors, sensory change, speech change, focal weakness, seizures, loss  of consciousness, weakness and headaches.  Endo/Heme/Allergies: Negative for environmental allergies and polydipsia. Does not bruise/bleed easily.  Psychiatric/Behavioral: Negative for depression, suicidal ideas, hallucinations, memory loss and substance abuse. The patient is not nervous/anxious and does not have insomnia.     Blood pressure 145/86, pulse 88, temperature 98.2 F (36.8 C), temperature source Rectal, resp. rate 19, SpO2 96 %. Physical Exam  Constitutional: He appears well-developed and well-nourished.  HENT:  Head: Normocephalic and atraumatic.  Eyes: Conjunctivae and EOM are normal. Pupils are equal, round, and reactive to light. Right eye exhibits no discharge. Left eye exhibits no discharge. No scleral icterus.  Neck: Normal range of motion. Neck supple. No JVD present. No tracheal deviation present. No thyromegaly present.  Cardiovascular: Normal rate and regular rhythm.  Exam reveals no gallop and no friction rub.   No murmur heard. Respiratory: Effort normal and breath sounds normal. No stridor. No respiratory distress. He has no wheezes. He has no rales. He exhibits no tenderness.  GI: Soft. Bowel sounds are normal. He exhibits no distension. There is no tenderness. There is no rebound and no guarding.  Musculoskeletal: Normal range of motion. He exhibits no edema or tenderness.  Lymphadenopathy:    He has no cervical adenopathy.  Neurological: He is alert. He has normal reflexes. He displays normal reflexes. A cranial nerve deficit is present. He exhibits normal muscle tone. Coordination normal.  + expressive aphasia  Skin: Skin is warm and dry. No rash noted. No erythema. No pallor.  Scar from nephroectomy     Assessment/Plan AMS ? Secondary to cocaine use, hypertensive emergency, ddx cva Appreciate neurology input.  CT scan brain pending  MRI brain   ARF Check urine sodium, creatinine , eosinophils,  Check renal ultrasound Foley catheter if  possible  Hyponatremia Hydrate gently with normal saline  Abnormal lft ? Due to hepatitis C Check cmp in am Check abdominal ultrasound  CAD Hold off on aspirin til we find out results of CT scan  DVT Cont xarelto for now,  This may change depending upon results of CT scan  Jani Gravel 02/02/2014, 7:07 PM

## 2014-02-02 NOTE — ED Notes (Signed)
Patient presents today from home via EMS. [name] at bedside reports patient had right kidney removed on 12/26/13, went out last night and did cocaine, and came home with altered mental status. Patient alert to person, not to place or time.

## 2014-02-02 NOTE — ED Provider Notes (Signed)
CSN: [id number]     Arrival date & time 02/02/14  1355 History   First MD Initiated Contact with Patient 02/02/14 1459     Chief Complaint  Patient presents with  . Altered Mental Status  . Addiction Problem     (Consider location/radiation/quality/duration/timing/severity/associated sxs/prior Treatment) HPI Comments: 63 year old male with history of expressive aphasia from stroke, anemia, coronary artery disease, DVT, on Xarelto, anxiety, drug abuse with cocaine presents with altered mental status. Patient right kidney removed 10/14 for localized kidney cancer, only new medication narcotic. Patient went out last night at 5 PM last seen normal self and his wife saw him at noon today with altered mental status, diaphoresis. Some abuse with said use using cocaine all night long. Patient has used cocaine in the past but this was more significant amount of time and amount per report. No known head injury or any details of the evening. No fevers or recent infection. Patient has hepatitis C.  Patient is a 63 y.o. male presenting with altered mental status. The history is provided by medical records, the spouse and a relative.  Altered Mental Status   Past Medical History  Diagnosis Date  . Hypertension   . Anxiety   . CHF (congestive heart failure)   . Anemia   . Stroke 11/18/2011    [name] 07/31/2012; expressive aphasia noted on 09/30/2012  . High cholesterol   . History of DVT of lower extremity     "left; long time ago" (09/30/2012)  . Depression     [name] 07/19/2005 (09/30/2012)  . Hepatitis C     pt denies this hx on 09/30/2012  . CAD (coronary artery disease)     PCI to LAD 2012  . Renal cell carcinoma    Past Surgical History  Procedure Laterality Date  . Joint replacement  01/03/2007  . Coronary angioplasty with stent placement    . Total hip arthroplasty Left 01/03/2007    [name] 01/03/2007 (09/30/2012)  . Lumbar disc surgery  2001    herniated disc/notes 07/19/2005 (09/30/2012)   . Eye surgery Right 1975    [name] 07/19/2005; "injured playing football" (09/29/2012)  . Colonoscopy N/A 10/01/2012    Procedure: COLONOSCOPY;  Surgeon: Inda Castle, MD;  Location: [place];  Service: Endoscopy;  Laterality: N/A;  . Esophagogastroduodenoscopy N/A 10/01/2012    Procedure: ESOPHAGOGASTRODUODENOSCOPY (EGD);  Surgeon: Inda Castle, MD;  Location: [place];  Service: Endoscopy;  Laterality: N/A;  . Givens capsule study N/A 10/02/2012    Procedure: GIVENS CAPSULE STUDY;  Surgeon: Inda Castle, MD;  Location: [place];  Service: Endoscopy;  Laterality: N/A;  . Nephrectomy  12/26/2013    Laparascopic , [facility]   Family History  Problem Relation Age of Onset  . Hypertension Mother   . Deep vein thrombosis Mother    History  Substance Use Topics  . Smoking status: Never Smoker   . Smokeless tobacco: Never Used  . Alcohol Use: Yes     Comment: 09/30/2012 "drink a few beers/wk; don't drink qd"    Review of Systems  Unable to perform ROS: Mental status change      Allergies  Review of patient's allergies indicates no known allergies.  Home Medications   Prior to Admission medications   Medication Sig Start Date End Date Taking? Authorizing Provider  amLODipine (NORVASC) 10 MG tablet Take 5 mg by mouth daily.    Historical Provider, MD  cholecalciferol (VITAMIN D) 1000 UNITS tablet Take 1,000 Units by  mouth daily.    Historical Provider, MD  citalopram (CELEXA) 20 MG tablet Take 10 mg by mouth daily.    Historical Provider, MD  cyclobenzaprine (FLEXERIL) 10 MG tablet Take 5 mg by mouth 3 (three) times daily as needed for muscle spasms.    Historical Provider, MD  pantoprazole (PROTONIX) 40 MG tablet Take 1 tablet (40 mg total) by mouth daily at 12 noon. 10/05/12   Verlee Monte, MD  Rivaroxaban (XARELTO) 20 MG TABS Take 20 mg by mouth every evening.     Historical Provider, MD  simvastatin (ZOCOR) 40 MG tablet Take 20 mg by mouth at bedtime.     Historical Provider, MD   BP 145/86 mmHg  Pulse 88  Temp(Src) 98.2 F (36.8 C) (Rectal)  Resp 19  SpO2 96% Physical Exam  Constitutional: He is oriented to person, place, and time. He appears well-developed and well-nourished. No distress.  HENT:  Head: Normocephalic and atraumatic.  Mild dry mucous membranes, injected conjunctiva worse on the right  Eyes: Right eye exhibits no discharge. Left eye exhibits no discharge.  Difficulty with neuro exam, possibility of disconjugate gaze as patient especially in the right eye has difficulty looking to the right, no obvious nystagmus.  Neck: Normal range of motion. Neck supple. No tracheal deviation present.  Cardiovascular: Regular rhythm.  Tachycardia present.   Pulmonary/Chest: No respiratory distress. He has rales (few crackles at bases, decreased effort).  Abdominal: Soft. He exhibits no distension. There is tenderness (suprapubic). There is no guarding.  Musculoskeletal: He exhibits no edema.  Neurological: He is alert and oriented to person, place, and time. GCS eye subscore is 4. GCS verbal subscore is 4. GCS motor subscore is 5.  Patient has expressive aphasia which is mild worsens baseline, difficulty following commands however able to get general neuro exam with repeated questioning and showing examples. Patient is 5+ strength equal bilateral grossly, sensation is to be intact bilateral. Neck is supple no meningismus. Patient diaphoretic, pupils dilated and minimally reactive bilateral. Finger nose with mild dysmetria bilateral  Skin: Skin is warm. No rash noted. He is diaphoretic.  Psychiatric:  Gen. confusion and expressive aphasia agitated  Nursing note and vitals reviewed.   ED Course  Procedures (including critical care time) Labs Review Labs Reviewed  COMPREHENSIVE METABOLIC PANEL - Abnormal; Notable for the following:    Sodium 136 (*)    BUN 24 (*)    Creatinine, Ser 1.64 (*)    Total Protein 8.4 (*)    AST 64 (*)     GFR calc non Af Amer 43 (*)    GFR calc Af Amer 50 (*)    Anion gap 18 (*)    All other components within normal limits  URINALYSIS, ROUTINE W REFLEX MICROSCOPIC - Abnormal; Notable for the following:    Hgb urine dipstick TRACE (*)    All other components within normal limits  URINE RAPID DRUG SCREEN (HOSP PERFORMED) - Abnormal; Notable for the following:    Cocaine POSITIVE (*)    All other components within normal limits  PROTIME-INR - Abnormal; Notable for the following:    Prothrombin Time 18.1 (*)    All other components within normal limits  I-STAT CG4 LACTIC ACID, ED - Abnormal; Notable for the following:    Lactic Acid, Venous 3.20 (*)    All other components within normal limits  URINE CULTURE  ETHANOL  TROPONIN I  APTT  AMMONIA  URINE MICROSCOPIC-ADD ON  CBC WITH DIFFERENTIAL  Imaging Review Ct Head Wo Contrast  02/02/2014   CLINICAL DATA:  Lower abdominal pain.  Altered mental status.  EXAM: CT HEAD WITHOUT CONTRAST  TECHNIQUE: Contiguous axial images were obtained from the base of the skull through the vertex without intravenous contrast.  COMPARISON:  None.  FINDINGS: There is low-density noted within the posterior left temporal lobe and parietal lobe compatible with subacute to chronic infarct. I favor this is chronic as the adjacent ventricle is mildly dilated. No acute infarction visualized. No hemorrhage or hydrocephalus. No acute bony abnormality. Paranasal sinuses are clear.  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   Electronically Signed   By: Rolm Baptise M.D.   On: 02/02/2014 19:22   Dg Chest Port 1 View  02/02/2014   CLINICAL DATA:  Altered mental status.  Severe lower abdominal pain.  EXAM: PORTABLE CHEST - 1 VIEW  COMPARISON:  Radiograph 09/30/2012  FINDINGS: Decubitus view was performed because patient would not allow to be positioned in standard position.  Normal cardiac silhouette. No pleural fluid,  pulmonary edema, or pneumothorax. No osseous abnormality.  IMPRESSION: No acute cardiopulmonary findings on decubitus view   Electronically Signed   By: Suzy Bouchard M.D.   On: 02/02/2014 17:24   Ct Renal Stone Study  02/02/2014   CLINICAL DATA:  Low abdominal pain, altered mental status.  EXAM: CT ABDOMEN AND PELVIS WITHOUT CONTRAST  TECHNIQUE: Multidetector CT imaging of the abdomen and pelvis was performed following the standard protocol without IV contrast.  COMPARISON:  None.  FINDINGS: There is mild atelectasis at the left lung base. No pericardial fluid.  No focal hepatic lesion. The gallbladder, pancreas, and spleen are normal. The adrenal glands are normal. Patient status post right nephrectomy. Left kidney is normal. No obstructive uropathy. Bladder is distended.  The stomach, small bowel, appendix, and cecum are normal. The colon and rectosigmoid colon are normal.  Abdominal or is normal caliber. No retroperitoneal periportal lymphadenopathy.  No free fluid the pelvis. Prostate gland is normal. Bladder is distended. No adenopathy. Left hip prosthetic noted. No aggressive osseous lesion.  IMPRESSION: 1. The bladder is considerably distended. Consider Foley catheter if patient cannot void. 2. Normal appendix. 3. Single left kidney.  No evidence of obstruction. 4. Mild left basilar atelectasis   Electronically Signed   By: Suzy Bouchard M.D.   On: 02/02/2014 19:27     EKG Interpretation None      MDM   Final diagnoses:  Altered mental state  Lower abdominal pain  Urinary retention  Cocaine abuse   Patient with history of stroke, cocaine abuse and expressive aphasia presents with confusion worsening mental status since significant cocaine use does tonight. No other known drugs or details of events. Plan for general altered mental status workup including CT, blood work, urinalysis. Patient has mild agitated in the room, small dose Ativan. Afebrile rectal temp.  Patient having lower  abdominal pain, initial concern for bladder retention secondary to prostate versus drug use. Nurse did bladder scan was said 50 mL's. Pain medicines given and CT abdomen ordered in addition to CT head. Patient had multiple attempts to get CT of his head however due to agitation and movement there unable. After multiple doses of Ativan and pain meds they were able to perform the CTs. Nurse unable to pass a Network engineer. With the help of the nursing staff I was able to pass a coud with minimal difficulty. Significant urine obtained. Discussed the case  with [facility]ad hospitalist for stepdown admission and further evaluation and treatment.  The patients results and plan were reviewed and discussed.   Any x-rays performed were personally reviewed by myself.   Differential diagnosis were considered with the presenting HPI.  Medications  LORazepam (ATIVAN) injection 1 mg (1 mg Intravenous Given 02/02/14 1531)  LORazepam (ATIVAN) injection 2 mg (2 mg Intravenous Given 02/02/14 1726)  0.9 %  sodium chloride infusion (100 mL/hr Intravenous New Bag/Given 02/02/14 1726)  fentaNYL (SUBLIMAZE) injection 75 mcg (75 mcg Intravenous Given 02/02/14 1747)  HYDROmorphone (DILAUDID) injection 1 mg (1 mg Intravenous Given 02/02/14 1850)  LORazepam (ATIVAN) injection 1 mg (1 mg Intravenous Given 02/02/14 1850)  lidocaine (XYLOCAINE) 2 % jelly 1 application (1 application Topical Given by Other 02/02/14 1955)  HYDROmorphone (DILAUDID) injection 1 mg (1 mg Intravenous Given 02/02/14 1952)    Filed Vitals:   02/02/14 1630 02/02/14 1645 02/02/14 1715 02/02/14 1745  BP: 159/113 147/100 136/116 145/86  Pulse: 87 88    Temp:      TempSrc:      Resp: 26 22 19    SpO2: 98% 96%      Final diagnoses:  Altered mental state  Lower abdominal pain  Urinary retention  Cocaine abuse    Admission/ observation were discussed with the admitting physician, patient and/or family and they are comfortable with the plan.       Mariea Clonts, MD 02/02/14 2035

## 2014-02-03 ENCOUNTER — Inpatient Hospital Stay (HOSPITAL_COMMUNITY): Payer: Non-veteran care

## 2014-02-03 DIAGNOSIS — F1410 Cocaine abuse, uncomplicated: Secondary | ICD-10-CM

## 2014-02-03 DIAGNOSIS — G9340 Encephalopathy, unspecified: Secondary | ICD-10-CM | POA: Insufficient documentation

## 2014-02-03 DIAGNOSIS — I10 Essential (primary) hypertension: Secondary | ICD-10-CM

## 2014-02-03 LAB — BASIC METABOLIC PANEL
ANION GAP: 10 (ref 5–15)
BUN: 23 mg/dL (ref 6–23)
CHLORIDE: 105 meq/L (ref 96–112)
CO2: 24 mEq/L (ref 19–32)
CREATININE: 1.63 mg/dL — AB (ref 0.50–1.35)
Calcium: 9.1 mg/dL (ref 8.4–10.5)
GFR calc Af Amer: 51 mL/min — ABNORMAL LOW (ref >90)
GFR calc non Af Amer: 44 mL/min — ABNORMAL LOW (ref >90)
Glucose, Bld: 81 mg/dL (ref 70–99)
POTASSIUM: 4.6 meq/L (ref 3.7–5.3)
Sodium: 139 mEq/L (ref 137–147)

## 2014-02-03 LAB — VITAMIN B12: VITAMIN B 12: 427 pg/mL (ref 211–911)

## 2014-02-03 LAB — TSH: TSH: 1.04 u[IU]/mL (ref 0.350–4.500)

## 2014-02-03 LAB — MRSA PCR SCREENING: MRSA by PCR: NEGATIVE

## 2014-02-03 LAB — URINE CULTURE: Colony Count: 50000

## 2014-02-03 LAB — HIV ANTIBODY (ROUTINE TESTING W REFLEX): HIV 1&2 Ab, 4th Generation: NONREACTIVE

## 2014-02-03 LAB — SEDIMENTATION RATE: Sed Rate: 24 mm/hr — ABNORMAL HIGH (ref 0–16)

## 2014-02-03 LAB — SODIUM, URINE, RANDOM: SODIUM UR: 74 meq/L

## 2014-02-03 LAB — RPR

## 2014-02-03 LAB — CK: CK TOTAL: 353 U/L — AB (ref 7–232)

## 2014-02-03 NOTE — Progress Notes (Signed)
Patient ID: Frank Barron, male   DOB: [birth year], 63 y.o.   MRN: [id number] [facility] PROGRESS NOTE  Frank Barron MBE:[id number] DOB: 03-Aug-1950 DOA: 02/02/2014 PCP: [place]  Brief narrative:    63 yo male with hx of Hepatitis C, CHF (unknown EF), CAD , CVA who presented to [facility] ED 02/02/2014 with worsening mental status changes including, more confusion, expressive aphasia. Patient has history of drug abuse and has used cocaine, In ED, vitals were stable. UDS was positive for cocaine. No acute intracranial abnormalities were seen on CT head. CXR showed no acute cardiopulmonary findings. Additionally, pt noted to be in acute renal failure.   Assessment/Plan:    Principal problem: Acute encephalopathy / probable drug-induced encephalopathy - Urine drug screen is positive for cocaine on the admission. - CT head showed no acute intracranial findings. Neurology has seen the patient in consultation and also things altered mental status likely secondary to cocaine use / drug abuse. - No signs of acute infection based on chest x-ray or urinalysis - Patient is medically stable to be transferred to telemetry floor.  Active problems: Substance abuse - counseled on cessation; SW also consulted for current substance abuse   Acute renal failure - Possibly from drug abuse, cocaine. Check CK level. - based on renal CT stone study bladder distended. Patient has Foley catheter with good urine output. Essential Hypertension - Continue Norvasc 5 mg daily, clonidine 0.1 mg by mouth 3 times a day DVT (deep venous thrombosis) - Continue anticoagulation with rivaroxaban  Abnormal LFTs - Abdominal ultrasound pending. Only AST elevated at 64. Alcohol level less than 11 on admission    DVT Prophylaxis  - on anticoagulation with xarelto   Code Status: Full.  Family Communication:  plan of care discussed with the patient Disposition Plan: Home when stable.   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     Dg Chest Port 1 View 02/02/2014    No acute cardiopulmonary findings on decubitus view   Ct Renal Stone Study 02/02/2014   1. The bladder is considerably distended. Consider Foley catheter if patient cannot void. 2. Normal appendix. 3. Single left kidney.  No evidence of obstruction. 4. Mild left basilar atelectasis      Medical Consultants:   Neurology  Other Consultants:   None  IAnti-Infectives:    None    Leisa Lenz, MD  [facility] Pager [phone]  If 7PM-7AM, please contact night-coverage [email or web address] Password [facility] 02/03/2014, 8:13 AM   LOS: 1 day    HPI/Subjective: No acute overnight events.  Objective: Filed Vitals:   02/02/14 2145 02/02/14 2215 02/02/14 2323 02/03/14 0355  BP: 120/83 136/97 134/93 147/96  Pulse: 70 67 77 69  Temp: 98.2 F (36.8 C) 97.9 F (36.6 C) 97.8 F (36.6 C) 98.4 F (36.9 C)  TempSrc: Oral Oral Oral Axillary  Resp: 12 13 21 16   Height:  6\' 3"  (1.905 m)    Weight:  95.3 kg (210 lb 1.6 oz)  95.3 kg (210 lb 1.6 oz)  SpO2: 99% 99% 99% 96%    Intake/Output Summary (Last 24 hours) at 02/03/14 0813 Last data filed at 02/03/14 0356  Gross per 24 hour  Intake 133.33 ml  Output   1750 ml  Net -1616.67 ml    Exam:   General:  Pt is alert, follows  commands appropriately, not in acute distress  Cardiovascular: Regular rate and rhythm, S1/S2, no murmurs  Respiratory: Clear to auscultation bilaterally, no wheezing, no crackles, no rhonchi  Abdomen: Soft, non tender, non distended, bowel sounds present  Extremities: No edema, pulses DP and PT palpable bilaterally  Neuro: Grossly nonfocal  Data Reviewed: Basic Metabolic Panel:  Recent Labs Lab 02/02/14 1610  NA 136*  K 4.6  CL 97  CO2 21  GLUCOSE 88  BUN 24*  CREATININE 1.64*   CALCIUM 9.5   Liver Function Tests:  Recent Labs Lab 02/02/14 1610  AST 64*  ALT 51  ALKPHOS 56  BILITOT 0.8  PROT 8.4*  ALBUMIN 3.9   No results for input(s): LIPASE, AMYLASE in the last 168 hours.  Recent Labs Lab 02/02/14 1446  AMMONIA 32   CBC:  Recent Labs Lab 02/02/14 2341  WBC 4.4  NEUTROABS 3.3  HGB 11.2*  HCT 33.4*  MCV 85.2  PLT 149*   Cardiac Enzymes:  Recent Labs Lab 02/02/14 1610  TROPONINI <0.30   BNP: Invalid input(s): POCBNP CBG: No results for input(s): GLUCAP in the last 168 hours.  Recent Results (from the past 240 hour(s))  MRSA PCR Screening     Status: None   Collection Time: 02/02/14 10:45 PM  Result Value Ref Range Status   MRSA by PCR NEGATIVE NEGATIVE Final    Comment:           Scheduled Meds: . amLODipine  5 mg Oral Daily  . cholecalciferol  1,000 Units Oral Daily  . citalopram  10 mg Oral Daily  . cloNIDine  0.1 mg Oral TID  . pantoprazole  40 mg Oral Q1200  . rivaroxaban  20 mg Oral QPM  . simvastatin  20 mg Oral QHS   Continuous Infusions: . sodium chloride 100 mL/hr at 02/02/14 2240

## 2014-02-03 NOTE — Progress Notes (Signed)
Pt being monitored via camera in room for safety. Wife at bedside and notified. Pt confused.

## 2014-02-03 NOTE — Progress Notes (Signed)
Patient to transfer to 3E19 report given to receiving RN Vickii Chafe, all questions answered at this time.  Patient stable for transfer.

## 2014-02-03 NOTE — Progress Notes (Signed)
Received pt to room 19 awake and alert oriented to self only. Calm at present. Wife at bedside

## 2014-02-03 NOTE — Progress Notes (Signed)
Patient transferred from ER via stretcher on tele. Patient unable to learn at this time. Placed callbell at bedside, bed alarm on, high fall risk. No family at bedside. Will continue to monitor.

## 2014-02-04 ENCOUNTER — Encounter (HOSPITAL_COMMUNITY): Payer: Self-pay

## 2014-02-04 DIAGNOSIS — R339 Retention of urine, unspecified: Secondary | ICD-10-CM | POA: Insufficient documentation

## 2014-02-04 LAB — CALCIUM / CREATININE RATIO, URINE
Calcium, Ur: 2 mg/dL
Calcium/Creat.Ratio: 0
Creatinine, Urine: 150.8 mg/dL

## 2014-02-04 LAB — ANA: Anti Nuclear Antibody(ANA): NEGATIVE

## 2014-02-04 MED ORDER — RIVAROXABAN 20 MG PO TABS
20.0000 mg | ORAL_TABLET | Freq: Every day | ORAL | Status: DC
  Administered 2014-02-04 – 2014-02-06 (×3): 20 mg via ORAL
  Filled 2014-02-04 (×4): qty 1

## 2014-02-04 MED ORDER — TAMSULOSIN HCL 0.4 MG PO CAPS
0.4000 mg | ORAL_CAPSULE | Freq: Every day | ORAL | Status: DC
  Administered 2014-02-04 – 2014-02-07 (×4): 0.4 mg via ORAL
  Filled 2014-02-04 (×5): qty 1

## 2014-02-04 NOTE — Progress Notes (Signed)
NEURO HOSPITALIST PROGRESS NOTE   SUBJECTIVE:                                                                                                                        Awake, slow answering questions. Denies HA, vertigo, double vision, difficulty swallowing, focal weakness, numbness, slurred speech, language or vision impairment.  OBJECTIVE:                                                                                                                           Vital signs in last 24 hours: Temp:  [97.6 F (36.4 C)-98.4 F (36.9 C)] 98.3 F (36.8 C) (11/23 0615) Pulse Rate:  [57-85] 64 (11/23 0615) Resp:  [15-20] 18 (11/23 0615) BP: (104-141)/(70-103) 121/84 mmHg (11/23 0615) SpO2:  [98 %-100 %] 99 % (11/23 0615) Weight:  [93.033 kg (205 lb 1.6 oz)-94.2 kg (207 lb 10.8 oz)] 93.033 kg (205 lb 1.6 oz) (11/23 0615)  Intake/Output from previous day: 11/22 0701 - 11/23 0700 In: 78 [P.O.:490] Out: 1975 [Urine:1975] Intake/Output this shift:   Nutritional status: Diet regular  Past Medical History  Diagnosis Date  . Hypertension   . Anxiety   . CHF (congestive heart failure)   . Anemia   . Stroke 11/18/2011    [name] 07/31/2012; expressive aphasia noted on 09/30/2012  . High cholesterol   . History of DVT of lower extremity     "left; long time ago" (09/30/2012)  . Depression     [name] 07/19/2005 (09/30/2012)  . Hepatitis C     pt denies this hx on 09/30/2012  . CAD (coronary artery disease)     PCI to LAD 2012  . Renal cell carcinoma    Physical exam: pleasant male in no apparent distress. Head: normocephalic. Neck: supple, no bruits, no JVD. Cardiac: no murmurs. Lungs: clear. Abdomen: soft, no tender, no mass. Extremities: no edema.  Neurologic Exam:  Mental Status: Alert, awake, oriented to month-year. Knows he is in the hospital. No dysarthria or dysphasia. Follows commands appropriately. Cranial Nerves: Pupils 2-3 mm bilaterally,  reactive. EOM full without nystagmus. Face appears to be symmetric. Tongue midline. III,IV, VI: eyes midline, no gaze preference, will minimally track examiner V,VII: smile symmetric, facial light touch  sensation normal bilaterally VIII: hearing normal bilaterally IX,X: gag reflex present XI: trapezius strength/neck flexion strength normal bilaterally XII: tongue strength normal  Motor: Due to mental status unable to formally test, appears to lift all extremities symmetrically against gravity and light resistance Tone appears normal Sensory: intact Deep Tendon Reflexes: 2+ and symmetric throughout Plantars: Right: equiovcalLeft: downgoing Cerebellar: Normal finger to nose and heel-knee-shin Gait: no tested due to safety reasons.  Lab Results: No results found for: CHOL Lipid Panel No results for input(s): CHOL, TRIG, HDL, CHOLHDL, VLDL, LDLCALC in the last 72 hours.  Studies/Results: Ct Head Wo Contrast  02/02/2014   CLINICAL DATA:  Lower abdominal pain.  Altered mental status.  EXAM: CT HEAD WITHOUT CONTRAST  TECHNIQUE: Contiguous axial images were obtained from the base of the skull through the vertex without intravenous contrast.  COMPARISON:  None.  FINDINGS: There is low-density noted within the posterior left temporal lobe and parietal lobe compatible with subacute to chronic infarct. I favor this is chronic as the adjacent ventricle is mildly dilated. No acute infarction visualized. No hemorrhage or hydrocephalus. No acute bony abnormality. Paranasal sinuses are clear.  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   Electronically Signed   By: Rolm Baptise M.D.   On: 02/02/2014 19:22   US Abdomen Complete  02/03/2014   CLINICAL DATA:  Abnormal LFTs, history CHF, hepatitis-C, coronary artery disease, hypertension, renal cell carcinoma  EXAM: ULTRASOUND ABDOMEN COMPLETE  COMPARISON:  CT abdomen and  pelvis 02/02/2014  FINDINGS: Gallbladder: Normally distended without stones or wall thickening.  No pericholecystic fluid or sonographic Murphy sign.  Common bile duct: Diameter: Normal caliber 4 mm diameter  Liver: Echogenic, likely fatty infiltration, though this can be seen with cirrhosis and certain infiltrative disorders. No focal hepatic mass or nodularity. Hepatopetal portal venous flow.  IVC: Normal appearance  Pancreas: Limited visualization secondary to bowel gas, visualized portion of body and proximal tail normal appearance, remainder obscured.  Spleen: Normal appearance, 6.9 cm length  Right Kidney:  Surgically absent  Left Kidney: Length: 11.7 cm. Normal morphology without mass or hydronephrosis.  Abdominal aorta: Normal caliber  Other findings: No free-fluid  IMPRESSION: Post RIGHT nephrectomy.  Probable fatty infiltration of liver as above.  Inadequate pancreatic visualization.   Electronically Signed   By: Lavonia Dana M.D.   On: 02/03/2014 09:39   Dg Chest Port 1 View  02/02/2014   CLINICAL DATA:  Altered mental status.  Severe lower abdominal pain.  EXAM: PORTABLE CHEST - 1 VIEW  COMPARISON:  Radiograph 09/30/2012  FINDINGS: Decubitus view was performed because patient would not allow to be positioned in standard position.  Normal cardiac silhouette. No pleural fluid, pulmonary edema, or pneumothorax. No osseous abnormality.  IMPRESSION: No acute cardiopulmonary findings on decubitus view   Electronically Signed   By: Suzy Bouchard M.D.   On: 02/02/2014 17:24   Ct Renal Stone Study  02/02/2014   CLINICAL DATA:  Low abdominal pain, altered mental status.  EXAM: CT ABDOMEN AND PELVIS WITHOUT CONTRAST  TECHNIQUE: Multidetector CT imaging of the abdomen and pelvis was performed following the standard protocol without IV contrast.  COMPARISON:  None.  FINDINGS: There is mild atelectasis at the left lung base. No pericardial fluid.  No focal hepatic lesion. The gallbladder, pancreas, and  spleen are normal. The adrenal glands are normal. Patient status post right nephrectomy. Left kidney is normal. No obstructive uropathy. Bladder is distended.  The stomach, small  bowel, appendix, and cecum are normal. The colon and rectosigmoid colon are normal.  Abdominal or is normal caliber. No retroperitoneal periportal lymphadenopathy.  No free fluid the pelvis. Prostate gland is normal. Bladder is distended. No adenopathy. Left hip prosthetic noted. No aggressive osseous lesion.  IMPRESSION: 1. The bladder is considerably distended. Consider Foley catheter if patient cannot void. 2. Normal appendix. 3. Single left kidney.  No evidence of obstruction. 4. Mild left basilar atelectasis   Electronically Signed   By: Suzy Bouchard M.D.   On: 02/02/2014 19:27    MEDICATIONS                                                                                                                        Scheduled: . amLODipine  5 mg Oral Daily  . cholecalciferol  1,000 Units Oral Daily  . citalopram  10 mg Oral Daily  . cloNIDine  0.1 mg Oral TID  . pantoprazole  40 mg Oral Q1200  . rivaroxaban  20 mg Oral QPM  . simvastatin  20 mg Oral QHS  . sodium chloride  3 mL Intravenous Q12H    ASSESSMENT/PLAN:                                                                                                            63y/o gentleman with hx of prior L MCA infarct and polysubstance abuse presenting with AMS. Found to be cocaine + and in ARF in the ED.  Patient remarkably improved. Suspect his encephalopathy is likely multifactorial and related to cocaine usage, ARF, elevated BP. Neurology will sign off. Please, call with questions or concerns.  Dorian Pod, MD [facility] [phone]  02/04/2014, 8:24 AM

## 2014-02-04 NOTE — Plan of Care (Signed)
Problem: Phase I Progression Outcomes Goal: Dyspnea controlled at rest (HF) Outcome: Progressing Goal: Pain controlled with appropriate interventions Outcome: Progressing Goal: EF % per last Echo/documented,Core Reminder form on chart Outcome: Progressing Goal: Up in chair, BRP Outcome: Progressing Goal: Initial discharge plan identified Outcome: Progressing Goal: Voiding-avoid urinary catheter unless indicated Outcome: Progressing Goal: Hemodynamically stable Outcome: Progressing Goal: Other Phase I Outcomes/Goals Outcome: Progressing  Problem: Phase II Progression Outcomes Goal: Pain controlled Outcome: Progressing

## 2014-02-04 NOTE — Progress Notes (Signed)
Utilization review completed.  , RN, BSN. 

## 2014-02-04 NOTE — Progress Notes (Signed)
Patient ID: Frank Barron, male   DOB: Feb 26, 1951, 63 y.o.   MRN: [id number] [facility] PROGRESS NOTE  Frank Barron LEX:[id number] DOB: Jun 27, 1950 DOA: 02/02/2014 PCP: [place]  Brief narrative:    63 yo male with hx of Hepatitis C, CHF (unknown EF), CAD , CVA who presented to [facility] ED 02/02/2014 with worsening mental status changes including, more confusion, expressive aphasia. Patient has history of drug abuse and has used cocaine, In ED, vitals were stable. UDS was positive for cocaine. No acute intracranial abnormalities were seen on CT head. CXR showed no acute cardiopulmonary findings. Additionally, pt noted to be in acute renal failure.   Assessment/Plan:    Principal problem: Acute encephalopathy / probable drug-induced encephalopathy -  positive for cocaine on the admission. - CT head showed no acute intracranial findings. Neurology has seen the patient in consultation and also things altered mental status likely secondary to cocaine use / drug abuse. - No signs of acute infection based on chest x-ray or urinalysis  Substance abuse - counseled on cessation; SW also consulted for current substance abuse   Acute renal failure - Possibly from drug abuse, cocaine. CK level ok. - based on renal CT stone study bladder distended. Patient has Foley catheter with good urine output. -start flomax -attempt voiding trial in AM  Essential Hypertension - Continue Norvasc 5 mg daily, clonidine 0.1 mg by mouth 3 times a day DVT (deep venous thrombosis) - Continue anticoagulation with rivaroxaban   Abnormal LFTs - Abdominal ultrasound ok. Only AST elevated at 64. Alcohol level less than 11 on admission    DVT Prophylaxis  - on anticoagulation with xarelto   Code Status: Full.  Family Communication:  Patient/mother Disposition Plan: Home 1-2 days   IV access:   Peripheral IV  Procedures and diagnostic studies:    Ct Head Wo Contrast 02/02/2014   Subacute to  chronic left posterior temporal and parietal infarct, favor chronic.     Dg Chest Port 1 View 02/02/2014    No acute cardiopulmonary findings on decubitus view   Ct Renal Stone Study 02/02/2014   1. The bladder is considerably distended. Consider Foley catheter if patient cannot void. 2. Normal appendix. 3. Single left kidney.  No evidence of obstruction. 4. Mild left basilar atelectasis      Medical Consultants:   Neurology  Other Consultants:   None  IAnti-Infectives:    None      HPI/Subjective: No complaints this AM  Objective: Filed Vitals:   02/03/14 1400 02/03/14 1800 02/03/14 2215 02/04/14 0615  BP: 136/87 104/70 106/70 121/84  Pulse: 74 85 71 64  Temp: 98.4 F (36.9 C) 98.4 F (36.9 C) 98.1 F (36.7 C) 98.3 F (36.8 C)  TempSrc: Oral Oral Oral Oral  Resp: 18 20 18 18   Height:      Weight:    93.033 kg (205 lb 1.6 oz)  SpO2: 98% 100% 99% 99%    Intake/Output Summary (Last 24 hours) at 02/04/14 0900 Last data filed at 02/04/14 4496  Gross per 24 hour  Intake    490 ml  Output   1975 ml  Net  -1485 ml    Exam:   General:  Pt is alert, follows commands appropriately, not in acute distress  Cardiovascular: Regular rate and rhythm, S1/S2, no murmurs  Respiratory: Clear to auscultation bilaterally, no wheezing, no crackles, no rhonchi  Abdomen: Soft, non tender, non distended, bowel sounds present  Extremities: No edema, pulses DP  and PT palpable bilaterally  Neuro: Grossly nonfocal  Data Reviewed: Basic Metabolic Panel:  Recent Labs Lab 02/02/14 1610 02/03/14 1036  NA 136* 139  K 4.6 4.6  CL 97 105  CO2 21 24  GLUCOSE 88 81  BUN 24* 23  CREATININE 1.64* 1.63*  CALCIUM 9.5 9.1   Liver Function Tests:  Recent Labs Lab 02/02/14 1610  AST 64*  ALT 51  ALKPHOS 56  BILITOT 0.8  PROT 8.4*  ALBUMIN 3.9   No results for input(s): LIPASE, AMYLASE in the last 168 hours.  Recent Labs Lab 02/02/14 1446  AMMONIA 32    CBC:  Recent Labs Lab 02/02/14 2341  WBC 4.4  NEUTROABS 3.3  HGB 11.2*  HCT 33.4*  MCV 85.2  PLT 149*   Cardiac Enzymes:  Recent Labs Lab 02/02/14 1610 02/03/14 0745  CKTOTAL  --  353*  TROPONINI <0.30  --    BNP: Invalid input(s): POCBNP CBG: No results for input(s): GLUCAP in the last 168 hours.  Recent Results (from the past 240 hour(s))  MRSA PCR Screening     Status: None   Collection Time: 02/02/14 10:45 PM  Result Value Ref Range Status   MRSA by PCR NEGATIVE NEGATIVE Final    Comment:           Scheduled Meds: . amLODipine  5 mg Oral Daily  . cholecalciferol  1,000 Units Oral Daily  . citalopram  10 mg Oral Daily  . cloNIDine  0.1 mg Oral TID  . pantoprazole  40 mg Oral Q1200  . rivaroxaban  20 mg Oral QPM  . simvastatin  20 mg Oral QHS   Continuous Infusions:     Eulogio Bear, DO  [facility] Pager [phone]  If 7PM-7AM, please contact night-coverage [email or web address] Password [email or web address] 02/04/2014, 9:00 AM   LOS: 2 days

## 2014-02-05 ENCOUNTER — Encounter (HOSPITAL_COMMUNITY): Payer: Self-pay | Admitting: *Deleted

## 2014-02-05 LAB — BASIC METABOLIC PANEL
Anion gap: 11 (ref 5–15)
BUN: 20 mg/dL (ref 6–23)
CHLORIDE: 105 meq/L (ref 96–112)
CO2: 25 mEq/L (ref 19–32)
Calcium: 9 mg/dL (ref 8.4–10.5)
Creatinine, Ser: 1.72 mg/dL — ABNORMAL HIGH (ref 0.50–1.35)
GFR calc Af Amer: 47 mL/min — ABNORMAL LOW (ref >90)
GFR calc non Af Amer: 41 mL/min — ABNORMAL LOW (ref >90)
GLUCOSE: 109 mg/dL — AB (ref 70–99)
Potassium: 5.1 mEq/L (ref 3.7–5.3)
Sodium: 141 mEq/L (ref 137–147)

## 2014-02-05 LAB — CBC
HEMATOCRIT: 33.8 % — AB (ref 39.0–52.0)
Hemoglobin: 11.1 g/dL — ABNORMAL LOW (ref 13.0–17.0)
MCH: 29.2 pg (ref 26.0–34.0)
MCHC: 32.8 g/dL (ref 30.0–36.0)
MCV: 88.9 fL (ref 78.0–100.0)
Platelets: 130 10*3/uL — ABNORMAL LOW (ref 150–400)
RBC: 3.8 MIL/uL — ABNORMAL LOW (ref 4.22–5.81)
RDW: 16.1 % — ABNORMAL HIGH (ref 11.5–15.5)
WBC: 3.3 10*3/uL — AB (ref 4.0–10.5)

## 2014-02-05 MED ORDER — SODIUM CHLORIDE 0.9 % IV SOLN
INTRAVENOUS | Status: AC
  Administered 2014-02-05: 11:00:00 via INTRAVENOUS

## 2014-02-05 NOTE — Progress Notes (Addendum)
Patient ID: Frank Barron, male   DOB: March 24, 1950, 63 y.o.   MRN: [id number] [facility] PROGRESS NOTE  Kasean Denherder QMG:[id number] DOB: February 18, 1951 DOA: 02/02/2014 PCP: [place]  Brief narrative:    63 yo male with hx of Hepatitis C, CHF (unknown EF), CAD , CVA who presented to [facility] ED 02/02/2014 with worsening mental status changes including, more confusion, expressive aphasia. Patient has history of drug abuse and has used cocaine, In ED, vitals were stable. UDS was positive for cocaine. No acute intracranial abnormalities were seen on CT head. CXR showed no acute cardiopulmonary findings. Additionally, pt noted to be in acute renal failure.   Assessment/Plan:     Acute encephalopathy / probable drug-induced encephalopathy -  positive for cocaine on the admission. - CT head showed no acute intracranial findings. Neurology has seen the patient in consultation and also things altered mental status likely secondary to cocaine use / drug abuse. - No signs of acute infection based on chest x-ray or urinalysis  Substance abuse - counseled on cessation; SW also consulted for current substance abuse    Acute renal failure- recent nephrectomy (not sure what his baseline is now)- done at the [place] - Possibly from drug abuse, cocaine. CK level ok. - based on renal CT stone study bladder distended. Patient has Foley catheter with good urine output. -start flomax -attempt voiding trial today -encourage PO intake -good UOP- will give IVF for 12 hours -recheck BMP in AM  Essential Hypertension - clonidine 0.1 mg by mouth 3 times a day  DVT (deep venous thrombosis) - Continue anticoagulation with rivaroxaban   Abnormal LFTs - Abdominal ultrasound ok. Only AST elevated at 64. Alcohol level less than 11 on admission    DVT Prophylaxis  - on anticoagulation with xarelto   Nursing to ambulate patient-says he has not been out of bed  Code Status: Full.  Family  Communication:  Patient/mother 11/24; called daughter Disposition Plan: Home 1-2 days   IV access:   Peripheral IV  Procedures and diagnostic studies:    Ct Head Wo Contrast 02/02/2014   Subacute to chronic left posterior temporal and parietal infarct, favor chronic.     Dg Chest Port 1 View 02/02/2014    No acute cardiopulmonary findings on decubitus view   Ct Renal Stone Study 02/02/2014   1. The bladder is considerably distended. Consider Foley catheter if patient cannot void. 2. Normal appendix. 3. Single left kidney.  No evidence of obstruction. 4. Mild left basilar atelectasis      Medical Consultants:   Neurology  Other Consultants:   None  IAnti-Infectives:    None      HPI/Subjective: Has not been out of bed in 2 days  Objective: Filed Vitals:   02/04/14 0615 02/04/14 1617 02/04/14 2232 02/05/14 0449  BP: 121/84 122/79 124/84 122/87  Pulse: 64 63 60 58  Temp: 98.3 F (36.8 C) 98.3 F (36.8 C) 98.2 F (36.8 C)   TempSrc: Oral Oral Oral   Resp: 18 18 16 17   Height:      Weight: 93.033 kg (205 lb 1.6 oz)   96.435 kg (212 lb 9.6 oz)  SpO2: 99% 99% 94% 96%    Intake/Output Summary (Last 24 hours) at 02/05/14 0923 Last data filed at 02/05/14 0448  Gross per 24 hour  Intake    900 ml  Output   2000 ml  Net  -1100 ml    Exam:   General:  Pt is  alert, follows commands appropriately, not in acute distress- slow  Cardiovascular: Regular rate and rhythm, S1/S2, no murmurs  Respiratory: Clear to auscultation bilaterally, no wheezing, no crackles, no rhonchi  Abdomen: Soft, non tender, non distended, bowel sounds present  Extremities: No edema, pulses DP and PT palpable bilaterally  Neuro: Grossly nonfocal  Data Reviewed: Basic Metabolic Panel:  Recent Labs Lab 02/02/14 1610 02/03/14 1036 02/05/14 0347  NA 136* 139 141  K 4.6 4.6 5.1  CL 97 105 105  CO2 21 24 25   GLUCOSE 88 81 109*  BUN 24* 23 20  CREATININE 1.64* 1.63* 1.72*  CALCIUM  9.5 9.1 9.0   Liver Function Tests:  Recent Labs Lab 02/02/14 1610  AST 64*  ALT 51  ALKPHOS 56  BILITOT 0.8  PROT 8.4*  ALBUMIN 3.9   No results for input(s): LIPASE, AMYLASE in the last 168 hours.  Recent Labs Lab 02/02/14 1446  AMMONIA 32   CBC:  Recent Labs Lab 02/02/14 2341 02/05/14 0347  WBC 4.4 3.3*  NEUTROABS 3.3  --   HGB 11.2* 11.1*  HCT 33.4* 33.8*  MCV 85.2 88.9  PLT 149* 130*   Cardiac Enzymes:  Recent Labs Lab 02/02/14 1610 02/03/14 0745  CKTOTAL  --  353*  TROPONINI <0.30  --    BNP: Invalid input(s): POCBNP CBG: No results for input(s): GLUCAP in the last 168 hours.  Recent Results (from the past 240 hour(s))  MRSA PCR Screening     Status: None   Collection Time: 02/02/14 10:45 PM  Result Value Ref Range Status   MRSA by PCR NEGATIVE NEGATIVE Final    Comment:            Eulogio Bear, DO  [facility] Pager [phone]  If 7PM-7AM, please contact night-coverage [email or web address] Password [email or web address] 02/04/2014, 9:00 AM   LOS: 3 days

## 2014-02-05 NOTE — Plan of Care (Signed)
Problem: Phase I Progression Outcomes Goal: Dyspnea controlled at rest (HF) Outcome: Progressing Goal: Pain controlled with appropriate interventions Outcome: Completed/Met Date Met:  02/05/14 Goal: EF % per last Echo/documented,Core Reminder form on chart Outcome: Completed/Met Date Met:  02/05/14 Goal: Initial discharge plan identified Outcome: Completed/Met Date Met:  02/05/14 Goal: Voiding-avoid urinary catheter unless indicated Outcome: Progressing Goal: Hemodynamically stable Outcome: Progressing

## 2014-02-06 LAB — METHYLMALONIC ACID, SERUM: Methylmalonic Acid, Quantitative: 180 nmol/L (ref 87–318)

## 2014-02-06 MED ORDER — AMLODIPINE BESYLATE 5 MG PO TABS
5.0000 mg | ORAL_TABLET | Freq: Every day | ORAL | Status: DC
  Filled 2014-02-06: qty 1

## 2014-02-06 MED ORDER — AMLODIPINE BESYLATE 5 MG PO TABS
5.0000 mg | ORAL_TABLET | Freq: Every day | ORAL | Status: DC
  Administered 2014-02-06 – 2014-02-07 (×2): 5 mg via ORAL
  Filled 2014-02-06 (×2): qty 1

## 2014-02-06 MED ORDER — HYDRALAZINE HCL 25 MG PO TABS
25.0000 mg | ORAL_TABLET | Freq: Two times a day (BID) | ORAL | Status: DC
  Administered 2014-02-06 – 2014-02-07 (×2): 25 mg via ORAL
  Filled 2014-02-06 (×4): qty 1

## 2014-02-06 NOTE — Progress Notes (Addendum)
Patient ID: Frank Barron, male   DOB: 12/23/1950, 63 y.o.   MRN: [id number] [facility] PROGRESS NOTE  Frank Barron XNA:[id number] DOB: 10/29/1950 DOA: 02/02/2014 PCP: [place]  Brief narrative:    63 yo male with hx of Hepatitis C, CHF (unknown EF), CAD , CVA who presented to [facility] ED 02/02/2014 with worsening mental status changes including, more confusion, expressive aphasia. Patient has history of drug abuse and has used cocaine, In ED, vitals were stable. UDS was positive for cocaine. No acute intracranial abnormalities were seen on CT head. CXR showed no acute cardiopulmonary findings. Additionally, pt noted to be in acute renal failure.   HPI/Subjective: Ambulating in the halls today -has no complaints.   Assessment/Plan:     Acute encephalopathy / probable drug-induced encephalopathy -  positive for cocaine on the admission. - CT head showed no acute intracranial findings. Neurology has seen the patient in consultation and also things altered mental status likely secondary to cocaine use / drug abuse. - No signs of acute infection based on chest x-ray or urinalysis  Substance abuse - counseled on cessation; SW also consulted for current substance abuse    Acute renal failure- recent nephrectomy (not sure what his baseline is now)- done at the [place] - Has had recent nephrectomy in October and suspect that he may be at his baseline- however, his creatinine has gone up slightly from yesterday and I would like to repeat it in the morning to ensure that it remains stable prior to discharge - based on renal CT,  bladder distended and therefore started on flomax -Foley catheter placed and removed yesterday with good voiding subsequently -recheck BMP in AM  Probable fatty liver -Noted on ultrasound  Essential Hypertension - BP has been stable on clonidine 0.1 mg by mouth 3 times a day however, he does not take this at home -I have reviewed his home medications  with his wife and the patient takes metoprolol, amlodipine and hydralazine -only amlodipine as mentioned on his med rec -At this point I have resumed his amlodipine and hydralazine-I have recommended that he no longer take the metoprolol as it interacts with cocaine -I have asked for a pharmacy consult to correct the med rec   DVT (deep venous thrombosis) - Continue anticoagulation with rivaroxaban   Abnormal LFTs - AST elevated at 64 -Abdominal ultrasound shows probable fatty liver - Alcohol level less than 11 on admission    Code Status: Full.  Family Communication:  Patient/wife 11/25 Disposition Plan: Home 1-2 days   IV access:   Peripheral IV  Procedures and diagnostic studies:    Ct Head Wo Contrast 02/02/2014   Subacute to chronic left posterior temporal and parietal infarct, favor chronic.     Dg Chest Port 1 View 02/02/2014    No acute cardiopulmonary findings on decubitus view   Ct Renal Stone Study 02/02/2014   1. The bladder is considerably distended. Consider Foley catheter if patient cannot void. 2. Normal appendix. 3. Single left kidney.  No evidence of obstruction. 4. Mild left basilar atelectasis      Medical Consultants:   Neurology  Other Consultants:   None  IAnti-Infectives:    None    Objective: Filed Vitals:   02/05/14 2008 02/06/14 0514 02/06/14 0953 02/06/14 1445  BP: 128/90 137/90 110/77 122/79  Pulse: 55 50 67 57  Temp: 98.4 F (36.9 C) 98.1 F (36.7 C) 98.1 F (36.7 C) 97.7 F (36.5 C)  TempSrc: Oral Oral  Oral Oral  Resp: 17 18 18 18   Height:      Weight:  97.523 kg (215 lb)    SpO2: 100% 98% 97% 100%    Intake/Output Summary (Last 24 hours) at 02/06/14 1658 Last data filed at 02/06/14 1445  Gross per 24 hour  Intake   1365 ml  Output   1150 ml  Net    215 ml    Exam:   General:  Pt is alert, follows commands appropriately, not in acute distress-  Cardiovascular: Regular rate and rhythm, S1/S2, no  murmurs  Respiratory: Clear to auscultation bilaterally, no wheezing, no crackles, no rhonchi  Abdomen: Soft, non tender, non distended, bowel sounds present  Extremities: No edema, pulses DP and PT palpable bilaterally  Neuro: Grossly nonfocal  Data Reviewed: Basic Metabolic Panel:  Recent Labs Lab 02/02/14 1610 02/03/14 1036 02/05/14 0347  NA 136* 139 141  K 4.6 4.6 5.1  CL 97 105 105  CO2 21 24 25   GLUCOSE 88 81 109*  BUN 24* 23 20  CREATININE 1.64* 1.63* 1.72*  CALCIUM 9.5 9.1 9.0   Liver Function Tests:  Recent Labs Lab 02/02/14 1610  AST 64*  ALT 51  ALKPHOS 56  BILITOT 0.8  PROT 8.4*  ALBUMIN 3.9   No results for input(s): LIPASE, AMYLASE in the last 168 hours.  Recent Labs Lab 02/02/14 1446  AMMONIA 32   CBC:  Recent Labs Lab 02/02/14 2341 02/05/14 0347  WBC 4.4 3.3*  NEUTROABS 3.3  --   HGB 11.2* 11.1*  HCT 33.4* 33.8*  MCV 85.2 88.9  PLT 149* 130*   Cardiac Enzymes:  Recent Labs Lab 02/02/14 1610 02/03/14 0745  CKTOTAL  --  353*  TROPONINI <0.30  --    BNP: Invalid input(s): POCBNP CBG: No results for input(s): GLUCAP in the last 168 hours.  Recent Results (from the past 240 hour(s))  MRSA PCR Screening     Status: None   Collection Time: 02/02/14 10:45 PM  Result Value Ref Range Status   MRSA by PCR NEGATIVE NEGATIVE Final    Comment:           Debbe Odea, MD [phone]  If 7PM-7AM, please contact night-coverage [email or web address] Password [email or web address] 02/04/2014, 9:00 AM   LOS: 3 days

## 2014-02-06 NOTE — Progress Notes (Signed)
Pharmacy-Medication History I went over medications with pt's wife.  What we have listed is correct.  I think she was confused about generic name for flexeril, cyclobenzaprine.  She will check when she goes home later.  Will f/u tomorrow.  ,PharmD

## 2014-02-07 LAB — BASIC METABOLIC PANEL
Anion gap: 9 (ref 5–15)
BUN: 19 mg/dL (ref 6–23)
CHLORIDE: 104 meq/L (ref 96–112)
CO2: 24 meq/L (ref 19–32)
CREATININE: 1.63 mg/dL — AB (ref 0.50–1.35)
Calcium: 8.9 mg/dL (ref 8.4–10.5)
GFR calc non Af Amer: 44 mL/min — ABNORMAL LOW (ref >90)
GFR, EST AFRICAN AMERICAN: 51 mL/min — AB (ref >90)
GLUCOSE: 90 mg/dL (ref 70–99)
Potassium: 4.6 mEq/L (ref 3.7–5.3)
Sodium: 137 mEq/L (ref 137–147)

## 2014-02-07 MED ORDER — HYDRALAZINE HCL 25 MG PO TABS: 25.0000 mg | ORAL_TABLET | Freq: Two times a day (BID) | ORAL | Status: DC

## 2014-02-07 MED ORDER — VITAMIN B-1 100 MG PO TABS
100.0000 mg | ORAL_TABLET | Freq: Every day | ORAL | Status: DC
Start: 2014-02-07 — End: 2015-09-29

## 2014-02-07 MED ORDER — TAMSULOSIN HCL 0.4 MG PO CAPS: 0.4000 mg | ORAL_CAPSULE | Freq: Every day | ORAL | Status: DC

## 2014-02-07 MED ORDER — FOLIC ACID 1 MG PO TABS: 1.0000 mg | ORAL_TABLET | Freq: Every day | ORAL | Status: DC

## 2014-02-07 NOTE — Discharge Instructions (Signed)
Follow with Primary MD [facility] in 5 days  Please keep your appointment with the [facility] nephrology on 02/11/14 Get CBC, CMP, 2 view Chest X ray checked  by Primary MD next visit.    Activity: As tolerated with Full fall precautions use walker/cane & assistance as needed   Disposition Home    Diet: Heart Healthy  , with feeding assistance and aspiration precautions as needed.  For Heart failure patients - Check your Weight same time everyday, if you gain over 2 pounds, or you develop in leg swelling, experience more shortness of breath or chest pain, call your Primary MD immediately. Follow Cardiac Low Salt Diet and 1.8 lit/day fluid restriction.   On your next visit with your primary care physician please Get Medicines reviewed and adjusted.   Please request your Prim.MD to go over all Hospital Tests and Procedure/Radiological results at the follow up, please get all Hospital records sent to your Prim MD by signing hospital release before you go home.   If you experience worsening of your admission symptoms, develop shortness of breath, life threatening emergency, suicidal or homicidal thoughts you must seek medical attention immediately by calling 911 or calling your MD immediately  if symptoms less severe.  You Must read complete instructions/literature along with all the possible adverse reactions/side effects for all the Medicines you take and that have been prescribed to you. Take any new Medicines after you have completely understood and accpet all the possible adverse reactions/side effects.   Do not drive, operating heavy machinery, perform activities at heights, swimming or participation in water activities or provide baby sitting services if your were admitted for syncope or siezures until you have seen by Primary MD or a Neurologist and advised to do so again.  Do not drive when taking Pain medications.    Do not take more than prescribed Pain, Sleep and Anxiety  Medications  Special Instructions: If you have smoked or chewed Tobacco  in the last 2 yrs please stop smoking, stop any regular Alcohol  and or any Recreational drug use.  Wear Seat belts while driving.   Please note  You were cared for by a hospitalist during your hospital stay. If you have any questions about your discharge medications or the care you received while you were in the hospital after you are discharged, you can call the unit and asked to speak with the hospitalist on call if the hospitalist that took care of you is not available. Once you are discharged, your primary care physician will handle any further medical issues. Please note that NO REFILLS for any discharge medications will be authorized once you are discharged, as it is imperative that you return to your primary care physician (or establish a relationship with a primary care physician if you do not have one) for your aftercare needs so that they can reassess your need for medications and monitor your lab values.

## 2014-02-07 NOTE — Progress Notes (Signed)
Patient discharged to home. All discharge instructions explained, patient verbalizes understanding. DC IV, DC tele.

## 2014-02-07 NOTE — Discharge Summary (Signed)
Frank Barron, 63 y.o., DOB 1950-04-23, MRN [id number]. Admission date: 02/02/2014 Discharge Date 02/07/2014 Primary MD [facility] Admitting Physician Jani Gravel, MD  Admission Diagnosis  Urinary retention [R33.9] Altered mental state [R41.82] Cocaine abuse [F14.10] Lower abdominal pain [R10.30]  Discharge Diagnosis   Active Problems:   CAD (coronary artery disease)   Altered mental status   Acute renal failure   Hypertension   DVT (deep venous thrombosis)   Cocaine abuse   Acute encephalopathy   Urinary retention      Past Medical History  Diagnosis Date  . Hypertension   . Anxiety   . CHF (congestive heart failure)   . Anemia   . Stroke 11/18/2011    [name] 07/31/2012; expressive aphasia noted on 09/30/2012  . High cholesterol   . History of DVT of lower extremity     "left; long time ago" (09/30/2012)  . Depression     [name] 07/19/2005 (09/30/2012)  . Hepatitis C     pt denies this hx on 09/30/2012  . CAD (coronary artery disease)     PCI to LAD 2012  . Renal cell carcinoma     Past Surgical History  Procedure Laterality Date  . Joint replacement  01/03/2007  . Coronary angioplasty with stent placement    . Total hip arthroplasty Left 01/03/2007    [name] 01/03/2007 (09/30/2012)  . Lumbar disc surgery  2001    herniated disc/notes 07/19/2005 (09/30/2012)  . Eye surgery Right 1975    [name] 07/19/2005; "injured playing football" (09/29/2012)  . Colonoscopy N/A 10/01/2012    Procedure: COLONOSCOPY;  Surgeon: Inda Castle, MD;  Location: [place];  Service: Endoscopy;  Laterality: N/A;  . Esophagogastroduodenoscopy N/A 10/01/2012    Procedure: ESOPHAGOGASTRODUODENOSCOPY (EGD);  Surgeon: Inda Castle, MD;  Location: [place];  Service: Endoscopy;  Laterality: N/A;  . Givens capsule study N/A 10/02/2012    Procedure: GIVENS CAPSULE STUDY;  Surgeon: Inda Castle, MD;  Location: [place];  Service: Endoscopy;  Laterality: N/A;  . Nephrectomy   12/26/2013    Laparascopic , [facility]   Admission history of present illness/brief narrative: 63 yo male with hx of Hepatitis C, CHF (unknown EF), CAD , CVA who presented to [facility] ED 02/02/2014 with worsening mental status changes including, more confusion, expressive aphasia. Patient has history of drug abuse and has used cocaine, In ED, vitals were stable. UDS was positive for cocaine. No acute intracranial abnormalities were seen on CT head. CXR showed no acute cardiopulmonary findings. Additionally, pt noted to be in renal failure with a creatinine of 1.6, patient reports he has baseline kidney disease, status post nephrectomy recently, where he is been following regularly at the [facility] nephrology.  Hospital Course See H&P, Labs, Consult and Test reports for all details in brief, patient was admitted for **  Active Problems:   CAD (coronary artery disease)   Altered mental status   Acute renal failure   Hypertension   DVT (deep venous thrombosis)   Cocaine abuse   Acute encephalopathy   Urinary retention   Acute encephalopathy / probable drug-induced encephalopathy - positive for cocaine on the admission. - CT head showed no acute intracranial findings. Neurology has seen the patient in consultation and also things altered mental status likely secondary to cocaine use / drug abuse. - No signs of acute infection based on chest x-ray or urinalysis  Substance abuse - counseled on cessation; SW also consulted for current substance abuse  Acute renal failure- recent nephrectomy (not sure what his baseline is now)- done at the [place] - Has had recent nephrectomy in October and suspect that he may be at his baseline, patient creatinine remains stable at 1.6 on discharge, patient reports he has a follow-up with his urologist on 02/11/14, he was asked to keep up this appointment and follow as scheduled. - based on renal CT, bladder distended and therefore started on flomax -Foley  catheter placed and removed 11/24 with good voiding subsequently   Probable fatty liver -Noted on ultrasound  Essential Hypertension -Home medication has been reviewed with his wife and the patient takes metoprolol, amlodipine and hydralazine --At this point I have resumed his amlodipine and hydralazine-I have recommended that he no longer take the metoprolol as it interacts with cocaine   DVT (deep venous thrombosis) - Continue anticoagulation with rivaroxaban   Abnormal LFTs - AST elevated at 64 -Abdominal ultrasound shows probable fatty liver - Alcohol level less than 11 on admission  -We'll start on thiamine and folic acid on discharge, there are no evidence of withdrawals during this hospitalization.   Consults  Neurology  Significant Tests:  See full reports for all details    Ct Head Wo Contrast  02/02/2014   CLINICAL DATA:  Lower abdominal pain.  Altered mental status.  EXAM: CT HEAD WITHOUT CONTRAST  TECHNIQUE: Contiguous axial images were obtained from the base of the skull through the vertex without intravenous contrast.  COMPARISON:  None.  FINDINGS: There is low-density noted within the posterior left temporal lobe and parietal lobe compatible with subacute to chronic infarct. I favor this is chronic as the adjacent ventricle is mildly dilated. No acute infarction visualized. No hemorrhage or hydrocephalus. No acute bony abnormality. Paranasal sinuses are clear.  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   Electronically Signed   By: Rolm Baptise M.D.   On: 02/02/2014 19:22   US Abdomen Complete  02/03/2014   CLINICAL DATA:  Abnormal LFTs, history CHF, hepatitis-C, coronary artery disease, hypertension, renal cell carcinoma  EXAM: ULTRASOUND ABDOMEN COMPLETE  COMPARISON:  CT abdomen and pelvis 02/02/2014  FINDINGS: Gallbladder: Normally distended without stones or wall thickening.  No pericholecystic fluid or sonographic  Murphy sign.  Common bile duct: Diameter: Normal caliber 4 mm diameter  Liver: Echogenic, likely fatty infiltration, though this can be seen with cirrhosis and certain infiltrative disorders. No focal hepatic mass or nodularity. Hepatopetal portal venous flow.  IVC: Normal appearance  Pancreas: Limited visualization secondary to bowel gas, visualized portion of body and proximal tail normal appearance, remainder obscured.  Spleen: Normal appearance, 6.9 cm length  Right Kidney:  Surgically absent  Left Kidney: Length: 11.7 cm. Normal morphology without mass or hydronephrosis.  Abdominal aorta: Normal caliber  Other findings: No free-fluid  IMPRESSION: Post RIGHT nephrectomy.  Probable fatty infiltration of liver as above.  Inadequate pancreatic visualization.   Electronically Signed   By: Lavonia Dana M.D.   On: 02/03/2014 09:39   Dg Chest Port 1 View  02/02/2014   CLINICAL DATA:  Altered mental status.  Severe lower abdominal pain.  EXAM: PORTABLE CHEST - 1 VIEW  COMPARISON:  Radiograph 09/30/2012  FINDINGS: Decubitus view was performed because patient would not allow to be positioned in standard position.  Normal cardiac silhouette. No pleural fluid, pulmonary edema, or pneumothorax. No osseous abnormality.  IMPRESSION: No acute cardiopulmonary findings on decubitus view   Electronically Signed   By: Nicole Kindred  Leonia Reeves M.D.   On: 02/02/2014 17:24   Ct Renal Stone Study  02/02/2014   CLINICAL DATA:  Low abdominal pain, altered mental status.  EXAM: CT ABDOMEN AND PELVIS WITHOUT CONTRAST  TECHNIQUE: Multidetector CT imaging of the abdomen and pelvis was performed following the standard protocol without IV contrast.  COMPARISON:  None.  FINDINGS: There is mild atelectasis at the left lung base. No pericardial fluid.  No focal hepatic lesion. The gallbladder, pancreas, and spleen are normal. The adrenal glands are normal. Patient status post right nephrectomy. Left kidney is normal. No obstructive uropathy.  Bladder is distended.  The stomach, small bowel, appendix, and cecum are normal. The colon and rectosigmoid colon are normal.  Abdominal or is normal caliber. No retroperitoneal periportal lymphadenopathy.  No free fluid the pelvis. Prostate gland is normal. Bladder is distended. No adenopathy. Left hip prosthetic noted. No aggressive osseous lesion.  IMPRESSION: 1. The bladder is considerably distended. Consider Foley catheter if patient cannot void. 2. Normal appendix. 3. Single left kidney.  No evidence of obstruction. 4. Mild left basilar atelectasis   Electronically Signed   By: Suzy Bouchard M.D.   On: 02/02/2014 19:27     Today   Subjective:   Frank Barron today has no headache,no chest abdominal pain,no new weakness tingling or numbness, feels much better .  Objective:   Blood pressure 134/85, pulse 67, temperature 98 F (36.7 C), temperature source Oral, resp. rate 18, height 6\' 3"  (1.905 m), weight 97.07 kg (214 lb), SpO2 99 %.  Intake/Output Summary (Last 24 hours) at 02/07/14 1032 Last data filed at 02/07/14 0931  Gross per 24 hour  Intake   1100 ml  Output   1125 ml  Net    -25 ml    Exam Awake Alert, Oriented *3, No new F.N deficits, Normal affect [place].AT,PERRAL Supple Neck,No JVD, No cervical lymphadenopathy appriciated.  Symmetrical Chest wall movement, Good air movement bilaterally, CTAB RRR,No Gallops,Rubs or new Murmurs, No Parasternal Heave +ve B.Sounds, Abd Soft, Non tender, No organomegaly appriciated, No rebound -guarding or rigidity. No Cyanosis, Clubbing or edema, No new Rash or bruise  Data Review     CBC w Diff: Lab Results  Component Value Date   WBC 3.3* 02/05/2014   HGB 11.1* 02/05/2014   HCT 33.8* 02/05/2014   PLT 130* 02/05/2014   LYMPHOPCT 14 02/02/2014   MONOPCT 10 02/02/2014   EOSPCT 0 02/02/2014   BASOPCT 0 02/02/2014   CMP: Lab Results  Component Value Date   NA 137 02/07/2014   K 4.6 02/07/2014   CL 104 02/07/2014   CO2 24  02/07/2014   BUN 19 02/07/2014   CREATININE 1.63* 02/07/2014   PROT 8.4* 02/02/2014   ALBUMIN 3.9 02/02/2014   BILITOT 0.8 02/02/2014   ALKPHOS 56 02/02/2014   AST 64* 02/02/2014   ALT 51 02/02/2014  .  Micro Results Recent Results (from the past 240 hour(s))  Urine culture     Status: None   Collection Time: 02/02/14  3:11 PM  Result Value Ref Range Status   Specimen Description URINE, CLEAN CATCH  Final   Special Requests NONE  Final   Culture  Setup Time   Final    02/02/2014 16:42 Performed at [place]   Final    50,000 COLONIES/ML Performed at [facility]    Culture   Final    Multiple bacterial morphotypes present, none predominant. Suggest appropriate recollection if  clinically indicated. Performed at [facility]    Report Status 02/03/2014 FINAL  Final  MRSA PCR Screening     Status: None   Collection Time: 02/02/14 10:45 PM  Result Value Ref Range Status   MRSA by PCR NEGATIVE NEGATIVE Final    Comment:        The GeneXpert MRSA Assay (FDA approved for NASAL specimens only), is one component of a comprehensive MRSA colonization surveillance program. It is not intended to diagnose MRSA infection nor to guide or monitor treatment for MRSA infections.      Discharge Instructions      Follow-up Information    Follow up with [facility]. Call in 5 days.   Specialty:  General Practice   Contact information:   [place] [id number] [phone]       Discharge Medications     Medication List    TAKE these medications        amLODipine 10 MG tablet  Commonly known as:  NORVASC  Take 5 mg by mouth daily.     cholecalciferol 1000 UNITS tablet  Commonly known as:  VITAMIN D  Take 1,000 Units by mouth daily.     citalopram 20 MG tablet  Commonly known as:  CELEXA  Take 10 mg by mouth daily.     cyclobenzaprine 10 MG tablet  Commonly known as:  FLEXERIL  Take 5 mg by  mouth 3 (three) times daily as needed for muscle spasms.     folic acid 1 MG tablet  Commonly known as:  FOLVITE  Take 1 tablet (1 mg total) by mouth daily.     hydrALAZINE 25 MG tablet  Commonly known as:  APRESOLINE  Take 1 tablet (25 mg total) by mouth 2 (two) times daily.     pantoprazole 40 MG tablet  Commonly known as:  PROTONIX  Take 1 tablet (40 mg total) by mouth daily at 12 noon.     simvastatin 40 MG tablet  Commonly known as:  ZOCOR  Take 20 mg by mouth at bedtime.     tamsulosin 0.4 MG Caps capsule  Commonly known as:  FLOMAX  Take 1 capsule (0.4 mg total) by mouth daily.     thiamine 100 MG tablet  Commonly known as:  VITAMIN B-1  Take 1 tablet (100 mg total) by mouth daily.     XARELTO 20 MG Tabs tablet  Generic drug:  rivaroxaban  Take 20 mg by mouth every evening.         Total Time in preparing paper work, data evaluation and todays exam - 35 minutes  ,  M.D on 02/07/2014 at 10:32 AM  [facility] Office  [phone]

## 2014-06-01 ENCOUNTER — Encounter (HOSPITAL_COMMUNITY): Payer: Self-pay | Admitting: *Deleted

## 2014-06-01 ENCOUNTER — Emergency Department (INDEPENDENT_AMBULATORY_CARE_PROVIDER_SITE_OTHER)
Admission: EM | Admit: 2014-06-01 | Discharge: 2014-06-01 | Disposition: A | Discharge status: Home / Self Care | Payer: Medicare Other | Source: Home / Self Care | Attending: Emergency Medicine | Admitting: Emergency Medicine

## 2014-06-01 ENCOUNTER — Emergency Department (INDEPENDENT_AMBULATORY_CARE_PROVIDER_SITE_OTHER): Payer: Medicare Other

## 2014-06-01 DIAGNOSIS — M7021 Olecranon bursitis, right elbow: Secondary | ICD-10-CM

## 2014-06-01 MED ORDER — PREDNISONE 10 MG PO TABS: ORAL_TABLET | ORAL | Status: DC

## 2014-06-01 NOTE — ED Notes (Signed)
Fell 2 weeks ago.  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  Worked last night and noted R elbow swelled up and got painful.

## 2014-06-01 NOTE — ED Provider Notes (Signed)
CSN: [id number]     Arrival date & time 06/01/14  1340 History   First MD Initiated Contact with Patient 06/01/14 1458     Chief Complaint  Patient presents with  . Fall   (Consider location/radiation/quality/duration/timing/severity/associated sxs/prior Treatment) HPI Comments: Patient and his wife report that he suffered a mechanical fall 2 weeks ago and he landed on his right elbow during the fall. Had some minor discomfort at the time. 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 No hx of gout. No previous surgeries  Patient is a 64 y.o. male presenting with fall.  Fall This is a new problem. Episode onset: 2 weeks ago.     Past Medical History  Diagnosis Date  . Hypertension   . Anxiety   . CHF (congestive heart failure)   . Anemia   . Stroke 11/18/2011    [name] 07/31/2012; expressive aphasia noted on 09/30/2012  . High cholesterol   . History of DVT of lower extremity     "left; long time ago" (09/30/2012)  . Depression     [name] 07/19/2005 (09/30/2012)  . Hepatitis C     pt denies this hx on 09/30/2012  . CAD (coronary artery disease)     PCI to LAD 2012  . Renal cell carcinoma    Past Surgical History  Procedure Laterality Date  . Joint replacement  01/03/2007  . Coronary angioplasty with stent placement    . Total hip arthroplasty Left 01/03/2007    [name] 01/03/2007 (09/30/2012)  . Lumbar disc surgery  2001    herniated disc/notes 07/19/2005 (09/30/2012)  . Eye surgery Right 1975    [name] 07/19/2005; "injured playing football" (09/29/2012)  . Colonoscopy N/A 10/01/2012    Procedure: COLONOSCOPY;  Surgeon: Inda Castle, MD;  Location: [place];  Service: Endoscopy;  Laterality: N/A;  . Esophagogastroduodenoscopy N/A 10/01/2012    Procedure: ESOPHAGOGASTRODUODENOSCOPY (EGD);  Surgeon: Inda Castle, MD;  Location: [facility];  Service: Endoscopy;  Laterality: N/A;  .  Givens capsule study N/A 10/02/2012    Procedure: GIVENS CAPSULE STUDY;  Surgeon: Inda Castle, MD;  Location: [place];  Service: Endoscopy;  Laterality: N/A;  . Nephrectomy  12/26/2013    Laparascopic , [facility]   Family History  Problem Relation Age of Onset  . Hypertension Mother   . Deep vein thrombosis Mother    History  Substance Use Topics  . Smoking status: Never Smoker   . Smokeless tobacco: Never Used  . Alcohol Use: Yes     Comment: 09/30/2012 "drink a few beers/wk; don't drink qd"    Review of Systems  All other systems reviewed and are negative.   Allergies  Review of patient's allergies indicates no known allergies.  Home Medications   Prior to Admission medications   Medication Sig Start Date End Date Taking? Authorizing Provider  amLODipine (NORVASC) 10 MG tablet Take 5 mg by mouth daily.   Yes Historical Provider, MD  citalopram (CELEXA) 20 MG tablet Take 10 mg by mouth daily.   Yes Historical Provider, MD  cyclobenzaprine (FLEXERIL) 10 MG tablet Take 5 mg by mouth 3 (three) times daily as needed for muscle spasms.   Yes Historical Provider, MD  folic acid (FOLVITE) 1 MG tablet Take 1 tablet (1 mg total) by mouth daily. 02/07/14  Yes Albertine Patricia, MD  hydrALAZINE (APRESOLINE) 25 MG tablet Take 1 tablet (  25 mg total) by mouth 2 (two) times daily. 02/07/14  Yes Albertine Patricia, MD  Rivaroxaban (XARELTO) 20 MG TABS Take 20 mg by mouth every evening.    Yes Historical Provider, MD  thiamine (VITAMIN B-1) 100 MG tablet Take 1 tablet (100 mg total) by mouth daily. 02/07/14  Yes Albertine Patricia, MD  cholecalciferol (VITAMIN D) 1000 UNITS tablet Take 1,000 Units by mouth daily.    Historical Provider, MD  pantoprazole (PROTONIX) 40 MG tablet Take 1 tablet (40 mg total) by mouth daily at 12 noon. 10/05/12   Verlee Monte, MD  predniSONE (DELTASONE) 10 MG tablet Take 3 tabs po QD x 2 days then 1 tab po QD x 2 days then stop 06/01/14   Audelia Hives  , PA  simvastatin (ZOCOR) 40 MG tablet Take 20 mg by mouth at bedtime.    Historical Provider, MD  tamsulosin (FLOMAX) 0.4 MG CAPS capsule Take 1 capsule (0.4 mg total) by mouth daily. 02/07/14   Silver Huguenin Elgergawy, MD   BP 181/99 mmHg  Pulse 75  Temp(Src) 98.2 F (36.8 C) (Oral)  SpO2 98% Physical Exam  Constitutional: He is oriented to person, place, and time. He appears well-developed and well-nourished. No distress.  HENT:  Head: Normocephalic and atraumatic.  Cardiovascular: Normal rate.   Pulmonary/Chest: Effort normal.  Musculoskeletal: Normal range of motion.       Right elbow: He exhibits swelling and effusion. He exhibits normal range of motion, no deformity and no laceration. No tenderness found.  CSM exam along with ligamentous and ROM exercises all intact with respect to RUE.  Right elbow is minimally tender with and without induration, erythema or warmth to touch.   Neurological: He is alert and oriented to person, place, and time.  Skin: Skin is warm and dry.  Psychiatric: He has a normal mood and affect. His behavior is normal.  Nursing note and vitals reviewed.   ED Course  Procedures (including critical care time) Labs Review Labs Reviewed - No data to display  Imaging Review Dg Elbow Complete Right  06/01/2014   CLINICAL DATA:  Status post fall 2 weeks ago, posterior elbow pain  EXAM: RIGHT ELBOW - COMPLETE 3+ VIEW  COMPARISON:  None.  FINDINGS: No definite fracture is seen.  Irregular calcifications along the expected course of the distal triceps, suggesting calcific tendinopathy.  Mild cortical irregularity along the posterior aspect of the olecranon. A partial tear of the triceps tendon is not entirely excluded.  Overlying soft tissue swelling, suggesting olecranon bursitis.  IMPRESSION: No definite fracture is seen.  Calcific triceps tendinopathy, partial tear not excluded.  Overlying soft tissue swelling, suggesting olecranon bursitis.   Electronically  Signed   By: Julian Hy M.D.   On: 06/01/2014 16:00     MDM   1. Olecranon bursitis of right elbow   Patient's recent serum creatinine  Was 1.72, therefore, will use short course of prednisone rather than NSAIDs for current condition. No clinical indication or gout, cellulitis or septic arthritis. RICE therapy with PCP or ortho follow up if symptoms persist.     Lutricia Feil, PA 06/01/14 [phone]

## 2014-06-01 NOTE — Discharge Instructions (Signed)
Your films are without evidence of broken bones. You do have evidence of tendonitis. Please review instructions below for RICE therapy and take medication as prescribed. If symptoms do not improve, please follow up with either your primary care doctor or the orthopedist listed on your discharge paperwork.  Bursitis Bursitis is a swelling and soreness (inflammation) of a fluid-filled sac (bursa) that overlies and protects a joint. It can be caused by injury, overuse of the joint, arthritis or infection. The joints most likely to be affected are the elbows, shoulders, hips and knees. HOME CARE INSTRUCTIONS   Apply ice to the affected area for 15-20 minutes each hour while awake for 2 days. Put the ice in a plastic bag and place a towel between the bag of ice and your skin.  Rest the injured joint as much as possible, but continue to put the joint through a full range of motion, 4 times per day. (The shoulder joint especially becomes rapidly "frozen" if not used.) When the pain lessens, begin normal slow movements and usual activities.  Only take over-the-counter or prescription medicines for pain, discomfort or fever as directed by your caregiver.  Your caregiver may recommend draining the bursa and injecting medicine into the bursa. This may help the healing process.  Follow all instructions for follow-up with your caregiver. This includes any orthopedic referrals, physical therapy and rehabilitation. Any delay in obtaining necessary care could result in a delay or failure of the bursitis to heal and chronic pain. SEEK IMMEDIATE MEDICAL CARE IF:   Your pain increases even during treatment.  You develop an oral temperature above 102 F (38.9 C) and have heat and inflammation over the involved bursa. MAKE SURE YOU:   Understand these instructions.  Will watch your condition.  Will get help right away if you are not doing well or get worse. Document Released: 02/27/2000 Document Revised:  05/24/2011 Document Reviewed: 05/21/2013 [facility] Patient Information 2015 [place], [place]. This information is not intended to replace advice given to you by your health care provider. Make sure you discuss any questions you have with your health care provider.

## 2014-07-11 ENCOUNTER — Other Ambulatory Visit: Payer: Self-pay | Admitting: Orthopaedic Surgery

## 2014-07-11 DIAGNOSIS — M25552 Pain in left hip: Secondary | ICD-10-CM

## 2014-07-23 ENCOUNTER — Ambulatory Visit
Admission: RE | Admit: 2014-07-23 | Discharge: 2014-07-23 | Disposition: A | Discharge status: Home / Self Care | Payer: Medicare Other | Source: Ambulatory Visit | Attending: Orthopaedic Surgery | Admitting: Orthopaedic Surgery

## 2014-07-23 DIAGNOSIS — Z471 Aftercare following joint replacement surgery: Secondary | ICD-10-CM | POA: Diagnosis not present

## 2014-07-23 DIAGNOSIS — M25552 Pain in left hip: Secondary | ICD-10-CM

## 2014-07-23 DIAGNOSIS — Z96642 Presence of left artificial hip joint: Secondary | ICD-10-CM | POA: Diagnosis not present

## 2014-08-30 ENCOUNTER — Other Ambulatory Visit (HOSPITAL_COMMUNITY): Payer: Self-pay | Admitting: Orthopaedic Surgery

## 2014-09-04 ENCOUNTER — Encounter (HOSPITAL_COMMUNITY)
Admission: RE | Admit: 2014-09-04 | Discharge: 2014-09-04 | Disposition: A | Discharge status: Home / Self Care | Payer: Medicare PPO | Source: Ambulatory Visit | Attending: Orthopaedic Surgery | Admitting: Orthopaedic Surgery

## 2014-09-04 ENCOUNTER — Encounter (HOSPITAL_COMMUNITY): Payer: Self-pay

## 2014-09-04 DIAGNOSIS — M179 Osteoarthritis of knee, unspecified: Secondary | ICD-10-CM | POA: Insufficient documentation

## 2014-09-04 DIAGNOSIS — E785 Hyperlipidemia, unspecified: Secondary | ICD-10-CM | POA: Insufficient documentation

## 2014-09-04 DIAGNOSIS — Z0183 Encounter for blood typing: Secondary | ICD-10-CM | POA: Insufficient documentation

## 2014-09-04 DIAGNOSIS — I4891 Unspecified atrial fibrillation: Secondary | ICD-10-CM | POA: Insufficient documentation

## 2014-09-04 DIAGNOSIS — Z01812 Encounter for preprocedural laboratory examination: Secondary | ICD-10-CM | POA: Insufficient documentation

## 2014-09-04 DIAGNOSIS — Z7902 Long term (current) use of antithrombotics/antiplatelets: Secondary | ICD-10-CM | POA: Insufficient documentation

## 2014-09-04 DIAGNOSIS — Z7982 Long term (current) use of aspirin: Secondary | ICD-10-CM | POA: Diagnosis not present

## 2014-09-04 DIAGNOSIS — I10 Essential (primary) hypertension: Secondary | ICD-10-CM | POA: Diagnosis not present

## 2014-09-04 DIAGNOSIS — Z905 Acquired absence of kidney: Secondary | ICD-10-CM | POA: Insufficient documentation

## 2014-09-04 DIAGNOSIS — Z85528 Personal history of other malignant neoplasm of kidney: Secondary | ICD-10-CM | POA: Insufficient documentation

## 2014-09-04 DIAGNOSIS — Z01818 Encounter for other preprocedural examination: Secondary | ICD-10-CM | POA: Diagnosis not present

## 2014-09-04 DIAGNOSIS — I69320 Aphasia following cerebral infarction: Secondary | ICD-10-CM | POA: Insufficient documentation

## 2014-09-04 DIAGNOSIS — Z79899 Other long term (current) drug therapy: Secondary | ICD-10-CM | POA: Diagnosis not present

## 2014-09-04 DIAGNOSIS — Z86718 Personal history of other venous thrombosis and embolism: Secondary | ICD-10-CM | POA: Diagnosis not present

## 2014-09-04 DIAGNOSIS — I509 Heart failure, unspecified: Secondary | ICD-10-CM | POA: Diagnosis not present

## 2014-09-04 HISTORY — DX: Unspecified atrial fibrillation: I48.91

## 2014-09-04 LAB — URINALYSIS, ROUTINE W REFLEX MICROSCOPIC
Bilirubin Urine: NEGATIVE
Glucose, UA: NEGATIVE mg/dL
Hgb urine dipstick: NEGATIVE
Ketones, ur: NEGATIVE mg/dL
Leukocytes, UA: NEGATIVE
Nitrite: NEGATIVE
Protein, ur: NEGATIVE mg/dL
Specific Gravity, Urine: 1.023 (ref 1.005–1.030)
UROBILINOGEN UA: 1 mg/dL (ref 0.0–1.0)
pH: 6 (ref 5.0–8.0)

## 2014-09-04 LAB — CBC WITH DIFFERENTIAL/PLATELET
BASOS PCT: 1 % (ref 0–1)
Basophils Absolute: 0 10*3/uL (ref 0.0–0.1)
Eosinophils Absolute: 0.1 10*3/uL (ref 0.0–0.7)
Eosinophils Relative: 1 % (ref 0–5)
HCT: 35.9 % — ABNORMAL LOW (ref 39.0–52.0)
Hemoglobin: 12.2 g/dL — ABNORMAL LOW (ref 13.0–17.0)
LYMPHS PCT: 41 % (ref 12–46)
Lymphs Abs: 1.8 10*3/uL (ref 0.7–4.0)
MCH: 30.4 pg (ref 26.0–34.0)
MCHC: 34 g/dL (ref 30.0–36.0)
MCV: 89.5 fL (ref 78.0–100.0)
MONOS PCT: 7 % (ref 3–12)
Monocytes Absolute: 0.3 10*3/uL (ref 0.1–1.0)
NEUTROS ABS: 2.2 10*3/uL (ref 1.7–7.7)
Neutrophils Relative %: 50 % (ref 43–77)
Platelets: 171 10*3/uL (ref 150–400)
RBC: 4.01 MIL/uL — ABNORMAL LOW (ref 4.22–5.81)
RDW: 14.5 % (ref 11.5–15.5)
WBC: 4.4 10*3/uL (ref 4.0–10.5)

## 2014-09-04 LAB — COMPREHENSIVE METABOLIC PANEL
ALBUMIN: 3.8 g/dL (ref 3.5–5.0)
ALK PHOS: 50 U/L (ref 38–126)
ALT: 16 U/L — ABNORMAL LOW (ref 17–63)
AST: 21 U/L (ref 15–41)
Anion gap: 9 (ref 5–15)
BUN: 19 mg/dL (ref 6–20)
CHLORIDE: 107 mmol/L (ref 101–111)
CO2: 22 mmol/L (ref 22–32)
Calcium: 9.3 mg/dL (ref 8.9–10.3)
Creatinine, Ser: 1.47 mg/dL — ABNORMAL HIGH (ref 0.61–1.24)
GFR calc Af Amer: 57 mL/min — ABNORMAL LOW (ref >60)
GFR calc non Af Amer: 49 mL/min — ABNORMAL LOW (ref >60)
Glucose, Bld: 86 mg/dL (ref 65–99)
POTASSIUM: 3.8 mmol/L (ref 3.5–5.1)
SODIUM: 138 mmol/L (ref 135–145)
Total Bilirubin: 0.9 mg/dL (ref 0.3–1.2)
Total Protein: 7.1 g/dL (ref 6.5–8.1)

## 2014-09-04 LAB — SURGICAL PCR SCREEN
MRSA, PCR: NEGATIVE
Staphylococcus aureus: NEGATIVE

## 2014-09-04 LAB — SEDIMENTATION RATE: SED RATE: 10 mm/h (ref 0–16)

## 2014-09-04 LAB — TYPE AND SCREEN
ABO/RH(D): O POS
Antibody Screen: NEGATIVE

## 2014-09-04 LAB — C-REACTIVE PROTEIN

## 2014-09-04 LAB — PROTIME-INR
INR: 1.35 (ref 0.00–1.49)
PROTHROMBIN TIME: 16.8 s — AB (ref 11.6–15.2)

## 2014-09-04 LAB — APTT: APTT: 33 s (ref 24–37)

## 2014-09-04 NOTE — Progress Notes (Signed)
Pt denies any recent chest pain or sob. Pt had a PCI done in 2012. Pt doesn't know who his cardiologist is. Goes to [place] in [place] for all his care.  Pt has a history of a stroke, still has issues with expressive aphasia. He states he knows what he wants to say, but sometimes it comes out different.

## 2014-09-04 NOTE — Pre-Procedure Instructions (Signed)
Frank Barron  09/04/2014      [facility] - [place] ([place]), [place] [address] [place] [address] Phone: [phone] Fax: [phone]    Your procedure is scheduled on Thursday, June 30th   Report to [facility] Admitting at 10:30 AM  Call this number if you have problems the morning of surgery:  [phone]   Remember:  Do not eat food or drink liquids after midnight Wednesday.  Take these medicines the morning of surgery with A SIP OF WATER : Norvasc, Celexa, Flexeril, Protonix, Flomax   Do not wear jewelry - no rings or watches.  Do not wear lotions or colognes.  You may NOT wear deodorant the day of surgery.             Men may shave face and neck.   Do not bring valuables to the hospital.  [facility] is not responsible for any belongings or valuables. Contacts, dentures or bridgework may not be worn into surgery.  Leave your suitcase in the car.  After surgery it may be brought to your room. For patients admitted to the hospital, discharge time will be determined by your treatment team.   Name and phone number of your driver:     Special instructions:  "Preparing for Surgery" instruction sheet.  Please read over the following fact sheets that you were given. Pain Booklet, Coughing and Deep Breathing, Blood Transfusion Information, MRSA Information and Surgical Site Infection Prevention

## 2014-09-04 NOTE — Pre-Procedure Instructions (Signed)
Frank Barron  09/04/2014      [facility] - [place] ([place]), [place] [address] [place] [address] Phone: [phone] Fax: [phone]    Your procedure is scheduled on Thursday, September 12, 2014 at 12:30 PM.   Report to [facility] Entrance "A" Admitting Office at 10:30 AM.   Call this number if you have problems the morning of surgery: [phone]              Any questions prior to day of surgery, please call [phone] between 8 & 4 PM.     Remember:  Do not eat food or drink liquids after midnight Wednesday, 09/11/14.  Take these medicines the morning of surgery with A SIP OF WATER : Norvasc, Hydralazine, Celexa,  Protonix, Flomax, Flexeril - if needed   Do not wear jewelry.  Do not wear lotions, powders or cologne.  You may wear deodorant.             Men may shave face and neck.             Do not bring valuables to the hospital.  [facility] is not responsible for any belongings or valuables. Contacts, dentures or bridgework may not be worn into surgery.  Leave your suitcase in the car.  After surgery it may be brought to your room. For patients admitted to the hospital, discharge time will be determined by your treatment team.    Special instructions:  [place] - Preparing for Surgery  Before surgery, you can play an important role.  Because skin is not sterile, your skin needs to be as free of germs as possible.  You can reduce the number of germs on you skin by washing with CHG (chlorahexidine gluconate) soap before surgery.  CHG is an antiseptic cleaner which kills germs and bonds with the skin to continue killing germs even after washing.  Please DO NOT use if you have an allergy to CHG or antibacterial soaps.  If your skin becomes reddened/irritated stop using the CHG and inform your nurse when you arrive at Short Stay.  Do not shave (including legs and underarms) for at least 48 hours prior to the  first CHG shower.  You may shave your face.  Please follow these instructions carefully:   1.  Shower with CHG Soap the night before surgery and the                                morning of Surgery.  2.  If you choose to wash your hair, wash your hair first as usual with your       normal shampoo.  3.  After you shampoo, rinse your hair and body thoroughly to remove the                      Shampoo.  4.  Use CHG as you would any other liquid soap.  You can apply chg directly       to the skin and wash gently with scrungie or a clean washcloth.  5.  Apply the CHG Soap to your body ONLY FROM THE NECK DOWN.        Do not use on open wounds or open sores.  Avoid contact with your eyes, ears, mouth and genitals (private parts).  Wash genitals (private parts) with your normal soap.  6.  Wash thoroughly, paying special attention to the area where your surgery        will be performed.  7.  Thoroughly rinse your body with warm water from the neck down.  8.  DO NOT shower/wash with your normal soap after using and rinsing off       the CHG Soap.  9.  Pat yourself dry with a clean towel.            10.  Wear clean pajamas.            11.  Place clean sheets on your bed the night of your first shower and do not        sleep with pets.  Day of Surgery  Do not apply any lotions the morning of surgery.  Please wear clean clothes to the hospital.    Please read over the following fact sheets that you were given. Pain Booklet, Coughing and Deep Breathing, Blood Transfusion Information, MRSA Information and Surgical Site Infection Prevention

## 2014-09-04 NOTE — Pre-Procedure Instructions (Signed)
Frank Barron  09/04/2014      [facility] - [place] ([place]), [place] - [place] [address]  ([place]) [place] [address] Phone: [phone] Fax: [phone]    Your procedure is scheduled on Thursday, September 12, 2014 at 12:30 PM.   Report to [facility] Entrance "A" Admitting Office at 10:30 AM.   Call this number if you have problems the morning of surgery: [phone]              Any questions prior to day of surgery, please call [phone] between 8 & 4 PM.     Remember:  Do not eat food or drink liquids after midnight Wednesday, 09/11/14.  Take these medicines the morning of surgery with A SIP OF WATER : Norvasc, Hydralazine, Metoprolol, Celexa,  Protonix, Flexeril - if needed, Oxycodone - if needed  Stop Xarelto, Monday, 09/09/14 as instructed by surgeon.   Do not wear jewelry.  Do not wear lotions, powders or cologne.  You may wear deodorant.             Men may shave face and neck.             Do not bring valuables to the hospital.  [facility] is not responsible for any belongings or valuables. Contacts, dentures or bridgework may not be worn into surgery.  Leave your suitcase in the car.  After surgery it may be brought to your room. For patients admitted to the hospital, discharge time will be determined by your treatment team.    Special instructions:  [place] - Preparing for Surgery  Before surgery, you can play an important role.  Because skin is not sterile, your skin needs to be as free of germs as possible.  You can reduce the number of germs on you skin by washing with CHG (chlorahexidine gluconate) soap before surgery.  CHG is an antiseptic cleaner which kills germs and bonds with the skin to continue killing germs even after washing.  Please DO NOT use if you have an allergy to CHG or antibacterial soaps.  If your skin becomes reddened/irritated stop using the CHG and inform your nurse when you arrive at Short  Stay.  Do not shave (including legs and underarms) for at least 48 hours prior to the first CHG shower.  You may shave your face.  Please follow these instructions carefully:   1.  Shower with CHG Soap the night before surgery and the                                morning of Surgery.  2.  If you choose to wash your hair, wash your hair first as usual with your       normal shampoo.  3.  After you shampoo, rinse your hair and body thoroughly to remove the                      Shampoo.  4.  Use CHG as you would any other liquid soap.  You can apply chg directly       to the skin and wash gently with scrungie or a clean washcloth.  5.  Apply the CHG Soap to your body ONLY FROM THE NECK DOWN.        Do not use on open wounds or open sores.  Avoid contact with your eyes, ears, mouth and  genitals (private parts).  Wash genitals (private parts) with your normal soap.  6.  Wash thoroughly, paying special attention to the area where your surgery        will be performed.  7.  Thoroughly rinse your body with warm water from the neck down.  8.  DO NOT shower/wash with your normal soap after using and rinsing off       the CHG Soap.  9.  Pat yourself dry with a clean towel.            10.  Wear clean pajamas.            11.  Place clean sheets on your bed the night of your first shower and do not        sleep with pets.  Day of Surgery  Do not apply any lotions the morning of surgery.  Please wear clean clothes to the hospital.    Please read over the following fact sheets that you were given. Pain Booklet, Coughing and Deep Breathing, Blood Transfusion Information, MRSA Information and Surgical Site Infection Prevention

## 2014-09-05 NOTE — Progress Notes (Addendum)
Anesthesia Chart Review:  Pt is 64 year old male scheduled for L total knee arthroplasty on 09/12/2014 with Dr. Erlinda Hong.   PMH includes: CHF, HTN, CAD (stent to LAD 2011), atrial fibrillation, stroke (2013, has expressive aphasia), hepatitis C, DVT, anemia, renal cell carcinoma, hyperlipidemia, cocaine abuse (positive UDS 02/02/14) . Never smoker. BMI 27.5.  Medications include: amlodipine, ASA, hydralazine, metoprolol, protonix, xarelto, viagra, zocor. Pt to stop xarelto 09/09/14.   Preoperative labs reviewed.  PT 16.8. Will repeat DOS.   EKG 02/02/2014: Atrial fibrillation. Probable anteroseptal infarct, old. Baseline wander in lead(s).   Pt was cleared for surgery by [name], primary care doctor with the [facility].  By notes accompanying Dr. Lanell Matar clearance note, pt had an exercise nuclear stress test 12/12/2013 that showed:  1. Baseline EKG showing sinus bradycardia (51 bpm), no ST abnormalities.  2. No chest pain during exertion. Achieved 86% of predicted HR at 137.  3. Exercised 7 METS. No ischemic EKG changes. Positive PVCs and bigeminy.   Dr. Alferd Patee notes pt is cleared from medical and cardiac standpoint as pt had cardiac clearance for a nephrectomy 09/2013 and a normal stress test 12/12/2013.  If no changes, I anticipate pt can proceed with surgery as scheduled.   Willeen Cass, FNP-BC [facility] Short Stay [facility]/Anesthesiology Phone: [phone] 09/06/2014 4:40 PM

## 2014-09-11 MED ORDER — BUPIVACAINE LIPOSOME 1.3 % IJ SUSP
20.0000 mL | INTRAMUSCULAR | Status: AC
  Administered 2014-09-12: 20 mL
  Filled 2014-09-11 (×2): qty 20

## 2014-09-11 MED ORDER — CEFAZOLIN SODIUM-DEXTROSE 2-3 GM-% IV SOLR
2.0000 g | INTRAVENOUS | Status: AC
  Administered 2014-09-12: 2 g via INTRAVENOUS
  Filled 2014-09-11: qty 50

## 2014-09-12 ENCOUNTER — Encounter (HOSPITAL_COMMUNITY)
Admission: RE | Disposition: A | Discharge status: Skilled Nursing Facility | Payer: Self-pay | Source: Ambulatory Visit | Attending: Orthopaedic Surgery

## 2014-09-12 ENCOUNTER — Inpatient Hospital Stay (HOSPITAL_COMMUNITY): Payer: Non-veteran care | Admitting: Emergency Medicine

## 2014-09-12 ENCOUNTER — Inpatient Hospital Stay (HOSPITAL_COMMUNITY): Payer: Non-veteran care

## 2014-09-12 ENCOUNTER — Inpatient Hospital Stay (HOSPITAL_COMMUNITY)
Admission: RE | Admit: 2014-09-12 | Discharge: 2014-09-19 | DRG: 470 | Disposition: A | Discharge status: Skilled Nursing Facility | Payer: Non-veteran care | Source: Ambulatory Visit | Attending: Orthopaedic Surgery | Admitting: Orthopaedic Surgery

## 2014-09-12 ENCOUNTER — Inpatient Hospital Stay (HOSPITAL_COMMUNITY): Payer: Non-veteran care | Admitting: Certified Registered"

## 2014-09-12 ENCOUNTER — Encounter (HOSPITAL_COMMUNITY): Payer: Self-pay | Admitting: Surgery

## 2014-09-12 DIAGNOSIS — F419 Anxiety disorder, unspecified: Secondary | ICD-10-CM | POA: Diagnosis present

## 2014-09-12 DIAGNOSIS — I509 Heart failure, unspecified: Secondary | ICD-10-CM | POA: Diagnosis present

## 2014-09-12 DIAGNOSIS — I4891 Unspecified atrial fibrillation: Secondary | ICD-10-CM | POA: Diagnosis present

## 2014-09-12 DIAGNOSIS — Z96659 Presence of unspecified artificial knee joint: Secondary | ICD-10-CM

## 2014-09-12 DIAGNOSIS — Z96649 Presence of unspecified artificial hip joint: Secondary | ICD-10-CM | POA: Diagnosis present

## 2014-09-12 DIAGNOSIS — Z7982 Long term (current) use of aspirin: Secondary | ICD-10-CM | POA: Diagnosis not present

## 2014-09-12 DIAGNOSIS — M1712 Unilateral primary osteoarthritis, left knee: Secondary | ICD-10-CM | POA: Diagnosis present

## 2014-09-12 DIAGNOSIS — I10 Essential (primary) hypertension: Secondary | ICD-10-CM | POA: Diagnosis present

## 2014-09-12 DIAGNOSIS — G8918 Other acute postprocedural pain: Secondary | ICD-10-CM | POA: Diagnosis not present

## 2014-09-12 DIAGNOSIS — E780 Pure hypercholesterolemia: Secondary | ICD-10-CM | POA: Diagnosis present

## 2014-09-12 DIAGNOSIS — I2510 Atherosclerotic heart disease of native coronary artery without angina pectoris: Secondary | ICD-10-CM | POA: Diagnosis present

## 2014-09-12 DIAGNOSIS — Z86718 Personal history of other venous thrombosis and embolism: Secondary | ICD-10-CM

## 2014-09-12 DIAGNOSIS — Z8673 Personal history of transient ischemic attack (TIA), and cerebral infarction without residual deficits: Secondary | ICD-10-CM

## 2014-09-12 DIAGNOSIS — F329 Major depressive disorder, single episode, unspecified: Secondary | ICD-10-CM | POA: Diagnosis present

## 2014-09-12 DIAGNOSIS — Z85528 Personal history of other malignant neoplasm of kidney: Secondary | ICD-10-CM | POA: Diagnosis not present

## 2014-09-12 DIAGNOSIS — Z96652 Presence of left artificial knee joint: Secondary | ICD-10-CM

## 2014-09-12 DIAGNOSIS — M179 Osteoarthritis of knee, unspecified: Secondary | ICD-10-CM | POA: Diagnosis not present

## 2014-09-12 HISTORY — PX: TOTAL KNEE ARTHROPLASTY: SHX125

## 2014-09-12 LAB — PROTIME-INR
INR: 1.23 (ref 0.00–1.49)
Prothrombin Time: 15.7 seconds — ABNORMAL HIGH (ref 11.6–15.2)

## 2014-09-12 SURGERY — ARTHROPLASTY, KNEE, TOTAL
Anesthesia: Regional | Site: Knee | Laterality: Left

## 2014-09-12 MED ORDER — ACETAMINOPHEN 325 MG PO TABS: 650.0000 mg | ORAL_TABLET | Freq: Four times a day (QID) | ORAL | Status: DC | PRN

## 2014-09-12 MED ORDER — PROMETHAZINE HCL 25 MG/ML IJ SOLN: 6.2500 mg | INTRAMUSCULAR | Status: DC | PRN

## 2014-09-12 MED ORDER — DEXMEDETOMIDINE BOLUS VIA INFUSION: 0.7000 ug/kg | Freq: Once | INTRAVENOUS | Status: DC

## 2014-09-12 MED ORDER — ALUM & MAG HYDROXIDE-SIMETH 200-200-20 MG/5ML PO SUSP
30.0000 mL | ORAL | Status: DC | PRN
Start: 2014-09-12 — End: 2014-09-19

## 2014-09-12 MED ORDER — METOPROLOL TARTRATE 12.5 MG HALF TABLET
12.5000 mg | ORAL_TABLET | Freq: Two times a day (BID) | ORAL | Status: DC
  Administered 2014-09-12 – 2014-09-18 (×12): 12.5 mg via ORAL
  Filled 2014-09-12 (×12): qty 1

## 2014-09-12 MED ORDER — MIDAZOLAM HCL 2 MG/2ML IJ SOLN: 2.0000 mg | Freq: Once | INTRAMUSCULAR | Status: DC

## 2014-09-12 MED ORDER — LACTATED RINGERS IV SOLN
INTRAVENOUS | Status: DC
  Administered 2014-09-12: 11:00:00 via INTRAVENOUS

## 2014-09-12 MED ORDER — OXYCODONE HCL ER 10 MG PO T12A: 10.0000 mg | EXTENDED_RELEASE_TABLET | Freq: Two times a day (BID) | ORAL | Status: DC

## 2014-09-12 MED ORDER — FENTANYL CITRATE (PF) 100 MCG/2ML IJ SOLN
INTRAMUSCULAR | Status: AC
  Filled 2014-09-12: qty 2

## 2014-09-12 MED ORDER — CEFAZOLIN SODIUM-DEXTROSE 2-3 GM-% IV SOLR
2.0000 g | Freq: Four times a day (QID) | INTRAVENOUS | Status: AC
  Administered 2014-09-12 – 2014-09-13 (×2): 2 g via INTRAVENOUS
  Filled 2014-09-12 (×4): qty 50

## 2014-09-12 MED ORDER — ROCURONIUM BROMIDE 100 MG/10ML IV SOLN
INTRAVENOUS | Status: DC | PRN
  Administered 2014-09-12: 50 mg via INTRAVENOUS

## 2014-09-12 MED ORDER — ACETAMINOPHEN 500 MG PO TABS
1000.0000 mg | ORAL_TABLET | Freq: Four times a day (QID) | ORAL | Status: AC
  Administered 2014-09-13 (×4): 1000 mg via ORAL
  Filled 2014-09-12 (×4): qty 2

## 2014-09-12 MED ORDER — FENTANYL CITRATE (PF) 100 MCG/2ML IJ SOLN
INTRAMUSCULAR | Status: DC | PRN
  Administered 2014-09-12 (×5): 50 ug via INTRAVENOUS
  Administered 2014-09-12: 100 ug via INTRAVENOUS
  Administered 2014-09-12 (×3): 50 ug via INTRAVENOUS

## 2014-09-12 MED ORDER — HYDROMORPHONE HCL 1 MG/ML IJ SOLN
INTRAMUSCULAR | Status: AC
  Filled 2014-09-12: qty 1

## 2014-09-12 MED ORDER — RIVAROXABAN 20 MG PO TABS
20.0000 mg | ORAL_TABLET | Freq: Every day | ORAL | Status: DC
  Administered 2014-09-13 – 2014-09-19 (×7): 20 mg via ORAL
  Filled 2014-09-12 (×7): qty 1

## 2014-09-12 MED ORDER — PROPOFOL 10 MG/ML IV BOLUS
INTRAVENOUS | Status: AC
  Filled 2014-09-12: qty 20

## 2014-09-12 MED ORDER — FENTANYL CITRATE (PF) 250 MCG/5ML IJ SOLN
INTRAMUSCULAR | Status: AC
  Filled 2014-09-12: qty 5

## 2014-09-12 MED ORDER — AMLODIPINE BESYLATE 5 MG PO TABS
5.0000 mg | ORAL_TABLET | Freq: Every day | ORAL | Status: DC
  Administered 2014-09-12 – 2014-09-18 (×6): 5 mg via ORAL
  Filled 2014-09-12 (×6): qty 1

## 2014-09-12 MED ORDER — ONDANSETRON HCL 4 MG/2ML IJ SOLN: 4.0000 mg | Freq: Four times a day (QID) | INTRAMUSCULAR | Status: DC | PRN

## 2014-09-12 MED ORDER — ONDANSETRON HCL 4 MG/2ML IJ SOLN
INTRAMUSCULAR | Status: AC
  Filled 2014-09-12: qty 2

## 2014-09-12 MED ORDER — MENTHOL 3 MG MT LOZG
1.0000 | LOZENGE | OROMUCOSAL | Status: DC | PRN
Start: 2014-09-12 — End: 2014-09-19

## 2014-09-12 MED ORDER — ACETAMINOPHEN 650 MG RE SUPP: 650.0000 mg | Freq: Four times a day (QID) | RECTAL | Status: DC | PRN

## 2014-09-12 MED ORDER — FERROUS SULFATE 325 (65 FE) MG PO TABS
325.0000 mg | ORAL_TABLET | Freq: Two times a day (BID) | ORAL | Status: DC
  Administered 2014-09-13 – 2014-09-19 (×13): 325 mg via ORAL
  Filled 2014-09-12 (×13): qty 1

## 2014-09-12 MED ORDER — OXYCODONE HCL ER 10 MG PO T12A
10.0000 mg | EXTENDED_RELEASE_TABLET | Freq: Two times a day (BID) | ORAL | Status: DC
  Administered 2014-09-12 – 2014-09-18 (×13): 10 mg via ORAL
  Filled 2014-09-12 (×13): qty 1

## 2014-09-12 MED ORDER — SODIUM CHLORIDE 0.9 % IJ SOLN
INTRAMUSCULAR | Status: DC | PRN
  Administered 2014-09-12 (×4): 10 mL via INTRAVENOUS

## 2014-09-12 MED ORDER — ONDANSETRON HCL 4 MG/2ML IJ SOLN
INTRAMUSCULAR | Status: DC | PRN
  Administered 2014-09-12: 4 mg via INTRAVENOUS

## 2014-09-12 MED ORDER — LIDOCAINE HCL (CARDIAC) 20 MG/ML IV SOLN
INTRAVENOUS | Status: DC | PRN
  Administered 2014-09-12: 40 mg via INTRAVENOUS

## 2014-09-12 MED ORDER — HYDROMORPHONE HCL 1 MG/ML IJ SOLN
INTRAMUSCULAR | Status: AC
  Administered 2014-09-12: 0.5 mg via INTRAVENOUS
  Filled 2014-09-12: qty 2

## 2014-09-12 MED ORDER — MIDAZOLAM HCL 2 MG/2ML IJ SOLN
INTRAMUSCULAR | Status: AC
  Filled 2014-09-12: qty 2

## 2014-09-12 MED ORDER — HYDROMORPHONE HCL 1 MG/ML IJ SOLN: 0.5000 mg | INTRAMUSCULAR | Status: DC | PRN

## 2014-09-12 MED ORDER — HYDRALAZINE HCL 20 MG/ML IJ SOLN
INTRAMUSCULAR | Status: AC
  Administered 2014-09-12: 5 mg via INTRAVENOUS
  Filled 2014-09-12: qty 1

## 2014-09-12 MED ORDER — OXYCODONE HCL 5 MG PO TABS
ORAL_TABLET | ORAL | Status: AC
  Administered 2014-09-12: 10 mg via ORAL
  Filled 2014-09-12: qty 2

## 2014-09-12 MED ORDER — PROPOFOL 10 MG/ML IV BOLUS
INTRAVENOUS | Status: DC | PRN
  Administered 2014-09-12: 20 mg via INTRAVENOUS
  Administered 2014-09-12: 200 mg via INTRAVENOUS

## 2014-09-12 MED ORDER — ACETAMINOPHEN 10 MG/ML IV SOLN
INTRAVENOUS | Status: AC
  Filled 2014-09-12: qty 100

## 2014-09-12 MED ORDER — CHLORHEXIDINE GLUCONATE 4 % EX LIQD: 60.0000 mL | Freq: Once | CUTANEOUS | Status: DC

## 2014-09-12 MED ORDER — LIDOCAINE HCL (CARDIAC) 20 MG/ML IV SOLN
INTRAVENOUS | Status: AC
  Filled 2014-09-12: qty 5

## 2014-09-12 MED ORDER — 0.9 % SODIUM CHLORIDE (POUR BTL) OPTIME
TOPICAL | Status: DC | PRN
  Administered 2014-09-12: 1000 mL
  Administered 2014-09-12: 500 mL

## 2014-09-12 MED ORDER — PHENOL 1.4 % MT LIQD: 1.0000 | OROMUCOSAL | Status: DC | PRN

## 2014-09-12 MED ORDER — LABETALOL HCL 5 MG/ML IV SOLN
INTRAVENOUS | Status: AC
  Filled 2014-09-12: qty 4

## 2014-09-12 MED ORDER — HYDROMORPHONE HCL 1 MG/ML IJ SOLN
0.2500 mg | INTRAMUSCULAR | Status: DC | PRN
  Administered 2014-09-12 (×4): 0.5 mg via INTRAVENOUS

## 2014-09-12 MED ORDER — CELECOXIB 200 MG PO CAPS
200.0000 mg | ORAL_CAPSULE | Freq: Two times a day (BID) | ORAL | Status: DC
  Administered 2014-09-12 – 2014-09-18 (×13): 200 mg via ORAL
  Filled 2014-09-12 (×13): qty 1

## 2014-09-12 MED ORDER — ACETAMINOPHEN 10 MG/ML IV SOLN: 1000.0000 mg | Freq: Once | INTRAVENOUS | Status: DC

## 2014-09-12 MED ORDER — OXYCODONE HCL 5 MG PO TABS
5.0000 mg | ORAL_TABLET | ORAL | Status: DC | PRN
  Administered 2014-09-12 – 2014-09-19 (×33): 10 mg via ORAL
  Filled 2014-09-12 (×33): qty 2

## 2014-09-12 MED ORDER — DIPHENHYDRAMINE HCL 12.5 MG/5ML PO ELIX: 25.0000 mg | ORAL_SOLUTION | ORAL | Status: DC | PRN

## 2014-09-12 MED ORDER — HYDRALAZINE HCL 25 MG PO TABS
25.0000 mg | ORAL_TABLET | Freq: Two times a day (BID) | ORAL | Status: DC
  Administered 2014-09-12 – 2014-09-18 (×12): 25 mg via ORAL
  Filled 2014-09-12 (×12): qty 1

## 2014-09-12 MED ORDER — SODIUM CHLORIDE 0.9 % IV SOLN
INTRAVENOUS | Status: DC
  Administered 2014-09-12: 125 mL/h via INTRAVENOUS

## 2014-09-12 MED ORDER — MORPHINE SULFATE 2 MG/ML IJ SOLN
1.0000 mg | INTRAMUSCULAR | Status: DC | PRN
  Administered 2014-09-12 – 2014-09-13 (×2): 1 mg via INTRAVENOUS
  Filled 2014-09-12 (×4): qty 1

## 2014-09-12 MED ORDER — HYDRALAZINE HCL 20 MG/ML IJ SOLN
5.0000 mg | INTRAMUSCULAR | Status: AC | PRN
  Administered 2014-09-12 (×2): 5 mg via INTRAVENOUS

## 2014-09-12 MED ORDER — METOCLOPRAMIDE HCL 5 MG PO TABS
5.0000 mg | ORAL_TABLET | Freq: Three times a day (TID) | ORAL | Status: DC | PRN
  Administered 2014-09-15: 10 mg via ORAL
  Filled 2014-09-12: qty 2

## 2014-09-12 MED ORDER — SORBITOL 70 % SOLN: 30.0000 mL | Freq: Every day | Status: DC | PRN

## 2014-09-12 MED ORDER — OXYCODONE-ACETAMINOPHEN 5-325 MG PO TABS: 1.0000 | ORAL_TABLET | ORAL | Status: DC | PRN

## 2014-09-12 MED ORDER — SODIUM CHLORIDE 0.9 % IR SOLN
Status: DC | PRN
  Administered 2014-09-12: 3000 mL

## 2014-09-12 MED ORDER — MIDAZOLAM HCL 2 MG/2ML IJ SOLN
INTRAMUSCULAR | Status: AC
  Administered 2014-09-12: 2 mg via INTRAVENOUS
  Filled 2014-09-12: qty 2

## 2014-09-12 MED ORDER — HYDROMORPHONE HCL 1 MG/ML IJ SOLN
0.5000 mg | INTRAMUSCULAR | Status: DC | PRN
  Administered 2014-09-12 (×2): 0.5 mg via INTRAVENOUS

## 2014-09-12 MED ORDER — SIMVASTATIN 20 MG PO TABS
20.0000 mg | ORAL_TABLET | Freq: Every day | ORAL | Status: DC
  Administered 2014-09-12 – 2014-09-18 (×7): 20 mg via ORAL
  Filled 2014-09-12 (×7): qty 1

## 2014-09-12 MED ORDER — PANTOPRAZOLE SODIUM 40 MG PO TBEC
40.0000 mg | DELAYED_RELEASE_TABLET | Freq: Every day | ORAL | Status: DC
  Administered 2014-09-13 – 2014-09-18 (×6): 40 mg via ORAL
  Filled 2014-09-12 (×6): qty 1

## 2014-09-12 MED ORDER — GLYCOPYRROLATE 0.2 MG/ML IJ SOLN
INTRAMUSCULAR | Status: DC | PRN
  Administered 2014-09-12: 0.4 mg via INTRAVENOUS

## 2014-09-12 MED ORDER — LABETALOL HCL 5 MG/ML IV SOLN
INTRAVENOUS | Status: DC | PRN
  Administered 2014-09-12: 5 mg via INTRAVENOUS

## 2014-09-12 MED ORDER — POVIDONE-IODINE 10 % EX SOLN
CUTANEOUS | Status: DC | PRN
  Administered 2014-09-12: 1 via TOPICAL

## 2014-09-12 MED ORDER — POLYETHYLENE GLYCOL 3350 17 G PO PACK: 17.0000 g | PACK | Freq: Every day | ORAL | Status: DC | PRN

## 2014-09-12 MED ORDER — CITALOPRAM HYDROBROMIDE 10 MG PO TABS
10.0000 mg | ORAL_TABLET | Freq: Every day | ORAL | Status: DC
  Administered 2014-09-12 – 2014-09-18 (×7): 10 mg via ORAL
  Filled 2014-09-12 (×7): qty 1

## 2014-09-12 MED ORDER — ASPIRIN 81 MG PO CHEW
81.0000 mg | CHEWABLE_TABLET | Freq: Every day | ORAL | Status: DC
  Administered 2014-09-12 – 2014-09-18 (×7): 81 mg via ORAL
  Filled 2014-09-12 (×7): qty 1

## 2014-09-12 MED ORDER — CYCLOBENZAPRINE HCL 5 MG PO TABS
5.0000 mg | ORAL_TABLET | Freq: Three times a day (TID) | ORAL | Status: DC | PRN
  Administered 2014-09-13 – 2014-09-19 (×5): 5 mg via ORAL
  Filled 2014-09-12 (×10): qty 1

## 2014-09-12 MED ORDER — ONDANSETRON HCL 4 MG PO TABS: 4.0000 mg | ORAL_TABLET | Freq: Four times a day (QID) | ORAL | Status: DC | PRN

## 2014-09-12 MED ORDER — METOCLOPRAMIDE HCL 5 MG/ML IJ SOLN: 5.0000 mg | Freq: Three times a day (TID) | INTRAMUSCULAR | Status: DC | PRN

## 2014-09-12 MED ORDER — NEOSTIGMINE METHYLSULFATE 10 MG/10ML IV SOLN
INTRAVENOUS | Status: DC | PRN
  Administered 2014-09-12: 3 mg via INTRAVENOUS

## 2014-09-12 MED ORDER — SODIUM CHLORIDE 0.9 % IV SOLN
INTRAVENOUS | Status: DC | PRN
  Administered 2014-09-12: 16:00:00 via INTRAVENOUS

## 2014-09-12 SURGICAL SUPPLY — 73 items
BANDAGE ELASTIC 6 VELCRO ST LF (GAUZE/BANDAGES/DRESSINGS) ×3 IMPLANT
BANDAGE ESMARK 6X9 LF (GAUZE/BANDAGES/DRESSINGS) ×1 IMPLANT
BLADE SAG 18X100X1.27 (BLADE) ×3 IMPLANT
BLADE SAW SGTL 13.0X1.19X90.0M (BLADE) ×3 IMPLANT
BNDG ESMARK 6X9 LF (GAUZE/BANDAGES/DRESSINGS) ×3
BNDG GAUZE ELAST 4 BULKY (GAUZE/BANDAGES/DRESSINGS) ×3 IMPLANT
BONE CEMENT PALACOSE (Orthopedic Implant) ×6 IMPLANT
BOWL SMART MIX CTS (DISPOSABLE) ×3 IMPLANT
CAPT KNEE TOTAL 3 ×3 IMPLANT
CEMENT BONE PALACOSE (Orthopedic Implant) ×2 IMPLANT
COVER SURGICAL LIGHT HANDLE (MISCELLANEOUS) ×3 IMPLANT
CUFF TOURNIQUET SINGLE 34IN LL (TOURNIQUET CUFF) ×3 IMPLANT
CUFF TOURNIQUET SINGLE 44IN (TOURNIQUET CUFF) IMPLANT
DERMABOND ADVANCED (GAUZE/BANDAGES/DRESSINGS)
DERMABOND ADVANCED .7 DNX12 (GAUZE/BANDAGES/DRESSINGS) IMPLANT
DRAPE EXTREMITY T 121X128X90 (DRAPE) ×3 IMPLANT
DRAPE IMP U-DRAPE 54X76 (DRAPES) ×3 IMPLANT
DRAPE INCISE IOBAN 66X45 STRL (DRAPES) ×3 IMPLANT
DRAPE ORTHO SPLIT 77X108 STRL (DRAPES) ×4
DRAPE PROXIMA HALF (DRAPES) ×3 IMPLANT
DRAPE SURG 17X11 SM STRL (DRAPES) ×6 IMPLANT
DRAPE SURG ORHT 6 SPLT 77X108 (DRAPES) ×2 IMPLANT
DRAPE U-SHAPE 47X51 STRL (DRAPES) ×3 IMPLANT
DRSG AQUACEL AG ADV 3.5X14 (GAUZE/BANDAGES/DRESSINGS) IMPLANT
DRSG PAD ABDOMINAL 8X10 ST (GAUZE/BANDAGES/DRESSINGS) ×3 IMPLANT
DURAPREP 26ML APPLICATOR (WOUND CARE) ×9 IMPLANT
ELECT CAUTERY BLADE 6.4 (BLADE) ×6 IMPLANT
ELECT REM PT RETURN 9FT ADLT (ELECTROSURGICAL) ×3
ELECTRODE REM PT RTRN 9FT ADLT (ELECTROSURGICAL) ×1 IMPLANT
EVACUATOR 1/8 PVC DRAIN (DRAIN) IMPLANT
FACESHIELD WRAPAROUND (MASK) ×9 IMPLANT
GAUZE SPONGE 4X4 12PLY STRL (GAUZE/BANDAGES/DRESSINGS) ×3 IMPLANT
GAUZE XEROFORM 5X9 LF (GAUZE/BANDAGES/DRESSINGS) ×3 IMPLANT
GLOVE BIOGEL PI IND STRL 6.5 (GLOVE) ×3 IMPLANT
GLOVE BIOGEL PI IND STRL 8 (GLOVE) ×1 IMPLANT
GLOVE BIOGEL PI INDICATOR 6.5 (GLOVE) ×6
GLOVE BIOGEL PI INDICATOR 8 (GLOVE) ×2
GLOVE ECLIPSE 6.5 STRL STRAW (GLOVE) ×3 IMPLANT
GLOVE SURG SS PI 6.5 STRL IVOR (GLOVE) ×6 IMPLANT
GLOVE SURG SS PI 8.0 STRL IVOR (GLOVE) ×6 IMPLANT
GLOVE SURG SYN 7.5  E (GLOVE) ×4
GLOVE SURG SYN 7.5 E (GLOVE) ×2 IMPLANT
GOWN STRL REIN XL XLG (GOWN DISPOSABLE) ×6 IMPLANT
GOWN STRL REUS W/TWL 2XL LVL3 (GOWN DISPOSABLE) ×3 IMPLANT
HANDPIECE INTERPULSE COAX TIP (DISPOSABLE) ×2
KIT BASIN OR (CUSTOM PROCEDURE TRAY) ×3 IMPLANT
KIT ROOM TURNOVER OR (KITS) ×3 IMPLANT
MANIFOLD NEPTUNE II (INSTRUMENTS) ×3 IMPLANT
NEEDLE SPNL 18GX3.5 QUINCKE PK (NEEDLE) ×3 IMPLANT
NS IRRIG 1000ML POUR BTL (IV SOLUTION) ×3 IMPLANT
PACK TOTAL JOINT (CUSTOM PROCEDURE TRAY) ×3 IMPLANT
PACK UNIVERSAL I (CUSTOM PROCEDURE TRAY) ×3 IMPLANT
PAD ARMBOARD 7.5X6 YLW CONV (MISCELLANEOUS) ×6 IMPLANT
PADDING CAST ABS 6INX4YD NS (CAST SUPPLIES) ×2
PADDING CAST ABS COTTON 6X4 NS (CAST SUPPLIES) ×1 IMPLANT
PADDING CAST COTTON 6X4 STRL (CAST SUPPLIES) ×3 IMPLANT
PEN SKIN MARKING BROAD (MISCELLANEOUS) ×3 IMPLANT
SET HNDPC FAN SPRY TIP SCT (DISPOSABLE) ×1 IMPLANT
SET PAD KNEE POSITIONER (MISCELLANEOUS) ×3 IMPLANT
STAPLER VISISTAT 35W (STAPLE) ×3 IMPLANT
SUCTION FRAZIER TIP 10 FR DISP (SUCTIONS) IMPLANT
SUT ETHILON 2 0 FS 18 (SUTURE) ×6 IMPLANT
SUT VIC AB 0 CT1 27 (SUTURE) ×4
SUT VIC AB 0 CT1 27XBRD ANBCTR (SUTURE) ×2 IMPLANT
SUT VIC AB 1 CT1 27 (SUTURE) ×6
SUT VIC AB 1 CT1 27XBRD ANBCTR (SUTURE) ×3 IMPLANT
SUT VIC AB 2-0 CT1 27 (SUTURE) ×6
SUT VIC AB 2-0 CT1 TAPERPNT 27 (SUTURE) ×3 IMPLANT
SYR 50ML LL SCALE MARK (SYRINGE) ×3 IMPLANT
TOWEL OR 17X24 6PK STRL BLUE (TOWEL DISPOSABLE) ×3 IMPLANT
TOWEL OR 17X26 10 PK STRL BLUE (TOWEL DISPOSABLE) ×3 IMPLANT
WATER STERILE IRR 1000ML POUR (IV SOLUTION) ×6 IMPLANT
WRAP KNEE MAXI GEL POST OP (GAUZE/BANDAGES/DRESSINGS) ×3 IMPLANT

## 2014-09-12 NOTE — Transfer of Care (Signed)
Immediate Anesthesia Transfer of Care Note  Patient: Frank Barron  Procedure(s) Performed: Procedure(s): LEFT TOTAL KNEE ARTHROPLASTY (Left)  Patient Location: PACU  Anesthesia Type:General and Regional  Level of Consciousness: awake, alert , oriented and patient cooperative  Airway & Oxygen Therapy: Patient Spontanous Breathing and Patient connected to nasal cannula oxygen  Post-op Assessment: Report given to RN, Post -op Vital signs reviewed and stable, Patient moving all extremities, Patient moving all extremities X 4 and Patient able to stick tongue midline  Post vital signs: Reviewed and stable  Last Vitals:  Filed Vitals:   09/12/14 1700  Pulse:   Temp: 36.9 C  Resp:     Complications: No apparent anesthesia complications

## 2014-09-12 NOTE — H&P (Signed)
PREOPERATIVE H&P  Chief Complaint: left knee degenerative joint disease  HPI: Frank Barron is a 64 y.o. male who presents for surgical treatment of left knee degenerative joint disease.  He denies any changes in medical history.  Past Medical History  Diagnosis Date  . Hypertension   . Anxiety   . CHF (congestive heart failure)   . Anemia   . High cholesterol   . History of DVT of lower extremity     "left; long time ago" (09/30/2012)  . Depression     [name] 07/19/2005 (09/30/2012)  . Hepatitis C     pt denies this hx on 09/30/2012  . CAD (coronary artery disease)     PCI to LAD 2012  . Renal cell carcinoma   . Atrial fibrillation   . Stroke 11/18/2011    [name] 07/31/2012; expressive aphasia noted on 09/30/2012   Past Surgical History  Procedure Laterality Date  . Joint replacement  01/03/2007  . Coronary angioplasty with stent placement    . Total hip arthroplasty Left 01/03/2007    [name] 01/03/2007 (09/30/2012)  . Lumbar disc surgery  2001    herniated disc/notes 07/19/2005 (09/30/2012)  . Colonoscopy N/A 10/01/2012    Procedure: COLONOSCOPY;  Surgeon: Inda Castle, MD;  Location: [facility];  Service: Endoscopy;  Laterality: N/A;  . Esophagogastroduodenoscopy N/A 10/01/2012    Procedure: ESOPHAGOGASTRODUODENOSCOPY (EGD);  Surgeon: Inda Castle, MD;  Location: [facility];  Service: Endoscopy;  Laterality: N/A;  . Givens capsule study N/A 10/02/2012    Procedure: GIVENS CAPSULE STUDY;  Surgeon: Inda Castle, MD;  Location: [facility];  Service: Endoscopy;  Laterality: N/A;  . Nephrectomy  12/26/2013    Laparascopic , [facility]  . Eye surgery Right 1975    [name] 07/19/2005; "injured playing football" (09/29/2012)   History   Social History  . Marital Status: Married    Spouse Name: N/A  . Number of Children: N/A  . Years of Education: N/A   Social History Main Topics  . Smoking status: Never Smoker   . Smokeless tobacco: Never Used  . Alcohol  Use: Yes     Comment: 09/30/2012 "drink a few beers/wk; don't drink qd"  . Drug Use: No     Comment: 09/30/2012 "stopped a long time ago"  . Sexual Activity: Yes   Other Topics Concern  . Not on file   Social History Narrative   Family History  Problem Relation Age of Onset  . Hypertension Mother   . Deep vein thrombosis Mother    No Known Allergies Prior to Admission medications   Medication Sig Start Date End Date Taking? Authorizing Provider  amLODipine (NORVASC) 10 MG tablet Take 5 mg by mouth daily.   Yes Historical Provider, MD  aspirin 81 MG chewable tablet Chew 81 mg by mouth daily.   Yes Historical Provider, MD  citalopram (CELEXA) 20 MG tablet Take 10 mg by mouth daily.   Yes Historical Provider, MD  cyclobenzaprine (FLEXERIL) 10 MG tablet Take 5 mg by mouth 3 (three) times daily as needed for muscle spasms.   Yes Historical Provider, MD  ferrous sulfate 325 (65 FE) MG tablet Take 325 mg by mouth 2 (two) times daily with a meal.   Yes Historical Provider, MD  hydrALAZINE (APRESOLINE) 25 MG tablet Take 1 tablet (25 mg total) by mouth 2 (two) times daily. 02/07/14  Yes Silver Huguenin Elgergawy, MD  metoprolol tartrate (LOPRESSOR) 25 MG tablet Take 12.5 mg by  mouth 2 (two) times daily.   Yes Historical Provider, MD  oxyCODONE-acetaminophen (PERCOCET/ROXICET) 5-325 MG per tablet Take 2 tablets by mouth 3 (three) times daily as needed for severe pain.   Yes Historical Provider, MD  pantoprazole (PROTONIX) 40 MG tablet Take 1 tablet (40 mg total) by mouth daily at 12 noon. Patient taking differently: Take 40 mg by mouth daily.  10/05/12  Yes Verlee Monte, MD  Rivaroxaban (XARELTO) 20 MG TABS Take 20 mg by mouth daily with breakfast.    Yes Historical Provider, MD  sildenafil (VIAGRA) 100 MG tablet Take 50 mg by mouth daily as needed for erectile dysfunction. Do not exceed 1 tablet in 24 hours   Yes Historical Provider, MD  simvastatin (ZOCOR) 40 MG tablet Take 20 mg by mouth at bedtime.    Yes Historical Provider, MD  folic acid (FOLVITE) 1 MG tablet Take 1 tablet (1 mg total) by mouth daily. Patient not taking: Reported on 09/04/2014 02/07/14   Albertine Patricia, MD  thiamine (VITAMIN B-1) 100 MG tablet Take 1 tablet (100 mg total) by mouth daily. Patient not taking: Reported on 09/04/2014 02/07/14   Albertine Patricia, MD     Positive ROS: All other systems have been reviewed and were otherwise negative with the exception of those mentioned in the HPI and as above.  Physical Exam: General: Alert, no acute distress Cardiovascular: No pedal edema Respiratory: No cyanosis, no use of accessory musculature GI: abdomen soft Skin: No lesions in the area of chief complaint Neurologic: Sensation intact distally Psychiatric: Patient is competent for consent with normal mood and affect Lymphatic: no lymphedema  MUSCULOSKELETAL: exam stable  Assessment: left knee degenerative joint disease  Plan: Plan for Procedure(s): LEFT TOTAL KNEE ARTHROPLASTY  The risks benefits and alternatives were discussed with the patient including but not limited to the risks of nonoperative treatment, versus surgical intervention including infection, bleeding, nerve injury,  blood clots, cardiopulmonary complications, morbidity, mortality, among others, and they were willing to proceed.   Marianna Payment, MD   09/12/2014 7:13 AM

## 2014-09-12 NOTE — Anesthesia Preprocedure Evaluation (Addendum)
Anesthesia Evaluation  Patient identified by MRN, date of birth, ID band Patient awake    Reviewed: Allergy & Precautions, NPO status , Patient's Chart, lab work & pertinent test results  History of Anesthesia Complications Negative for: history of anesthetic complications  Airway Mallampati: II   Neck ROM: Full    Dental  (+) Teeth Intact, Missing,    Pulmonary neg pulmonary ROS,  breath sounds clear to auscultation        Cardiovascular hypertension, + CAD, + Cardiac Stents, + Peripheral Vascular Disease and +CHF Rhythm:Regular Rate:Normal     Neuro/Psych Anxiety Depression Hx of expressive aphasia CVA    GI/Hepatic (+) Hepatitis -, C  Endo/Other    Renal/GU Renal InsufficiencyRenal disease     Musculoskeletal   Abdominal   Peds  Hematology   Anesthesia Other Findings   Reproductive/Obstetrics                           Anesthesia Physical Anesthesia Plan  ASA: III  Anesthesia Plan: General   Post-op Pain Management:    Induction: Intravenous  Airway Management Planned: Natural Airway  Additional Equipment:   Intra-op Plan:   Post-operative Plan: Extubation in OR  Informed Consent: I have reviewed the patients History and Physical, chart, labs and discussed the procedure including the risks, benefits and alternatives for the proposed anesthesia with the patient or authorized representative who has indicated his/her understanding and acceptance.   Dental advisory given  Plan Discussed with: CRNA and Surgeon  Anesthesia Plan Comments: (Pt doesnt wish a spinal)       Anesthesia Quick Evaluation

## 2014-09-12 NOTE — Op Note (Signed)
Total Knee Arthroplasty Procedure Note Khali Albanese [id number] 09/12/2014   Preoperative diagnosis: Left knee osteoarthritis  Postoperative diagnosis:same  Operative procedure: Left total knee arthroplasty. CPT [phone]  Surgeon: N. Eduard Roux, MD  Co-surgeon: none  Assistants: Ky Barban, RNFA  Anesthesia: general, regional  Tourniquet time: 120 mins  Implants used: Stryker Femur: Triatholon 6 PS Tibia: Universal base plate 6 Patella: 32 mm asymmetric Polyethylene: 9 mm  Indication: Frank Barron is a 64 y.o. year old male with a history of knee pain. Having failed conservative management, the patient elected to proceed with a total knee arthroplasty.  We have reviewed the risk and benefits of the surgery and they elected to proceed after voicing understanding.  Procedure:  After informed consent was obtained and understanding of the risk were voiced including but not limited to bleeding, infection, damage to surrounding structures including nerves and vessels, blood clots, leg length inequality and the failure to achieve desired results, the operative extremity was marked with verbal confirmation of the patient in the holding area.   The patient was then brought to the operating room and transported to the operating room table in the supine position.  A tourniquet was applied to the operative extremity around the upper thigh. The operative limb was then prepped and draped in the usual sterile fashion and preoperative antibiotics were administered.  A time out was performed prior to the start of surgery confirming the correct extremity, preoperative antibiotic administration, as well as team members, implants and instruments available for the case. Correct surgical site was also confirmed with preoperative radiographs. The limb was then elevated for exsanguination and the tourniquet was inflated. A midline incision was made and a standard medial parapatellar approach was  performed.  The patella was prepared and sized to a 32 mm.  A cover was placed on the patella for protection from retractors.  We then turned our attention to the femur. Posterior cruciate ligament was sacrificed. Start site was drilled in the femur and the intramedullary distal femoral cutting guide was placed, set at 5 degrees valgus, taking 8 mm of distal resection. The distal cut was made. Osteophytes were then removed. Next, the proximal tibial cutting guide was placed with appropriate slope, varus/valgus alignment and depth of resection. The proximal tibial cut was made. Gap blocks were then used to assess the extension gap and alignment, and appropriate soft tissue releases were performed. Attention was turned back to the femur, which was sized using the sizing guide to a size 6. Appropriate rotation of the femoral component was determined using epicondylar axis, Whiteside's line, and assessing the flexion gap under ligament tension. The appropriate size 4-in-1 cutting block was placed and cuts were made. Posterior femoral osteophytes and uncapped bone were then removed with the curved osteotome. The tibia was sized for a size 6 component. The femoral box-cutting guide was placed and prepared for a PS femoral component. Trial components were placed, and stability was checked in full extension, mid-flexion, and deep flexion. Proper tibial rotation was determined and marked.  The patella tracked well without a lateral release. Trial components were then removed and tibial preparation performed. A posterior capsular injection comprising of 20 cc of 1.3% exparel and 40 cc of normal saline was performed for postoperative pain control. The bony surfaces were irrigated with a pulse lavage and then dried. Bone cement was vacuum mixed on the back table, and the final components sized above were cemented into place. After cement had finished curing, excess cement  was removed. The stability of the construct was  re-evaluated throughout a range of motion and found to be acceptable. The trial liner was removed, the knee was copiously irrigated, and the knee was re-evaluated for any excess bone debris. The real polyethylene liner, 9 mm thick, was inserted and checked to ensure the locking mechanism had engaged appropriately. The tourniquet was deflated and hemostasis was achieved. The wound was irrigated with dilute betadine in normal saline, and then again with normal saline. A drain was not placed. Capsular closure was performed with a #1 vicryl, subcutaneous fat closed with a 2.0 vicryl suture, then subcutaneous tissue closed with interrupted vicryl suture. The skin was then closed with a staples. A sterile dressing was applied.  The patient was awakened in the operating room and taken to recovery in stable condition. All sponge, needle, and instrument counts were correct at the end of the case.  Position: supine  Complications: none.  Time Out: performed   Drains/Packing: none  Estimated blood loss: minimal  Returned to Recovery Room: in good condition.   Antibiotics: yes   Mechanical VTE (DVT) Prophylaxis: sequential compression devices, TED thigh-high  Chemical VTE (DVT) Prophylaxis: xarelto  Fluid Replacement  Crystalloid: see anesthesia record Blood: none  FFP: none   Specimens Removed: 1 to pathology   Sponge and Instrument Count Correct? yes   PACU: portable radiograph - knee AP and Lateral   Admission: inpatient status, start PT & OT POD#1  Plan/RTC: Return in 2 weeks for wound check.   Weight Bearing/Load Lower Extremity: full   N. Eduard Roux, MD [place] [phone] 5:03 PM

## 2014-09-12 NOTE — Progress Notes (Signed)
Orthopedic Tech Progress Note Patient Details:  Frank Barron [birth year] [id number]  CPM Left Knee CPM Left Knee: On Left Knee Flexion (Degrees): 90 Left Knee Extension (Degrees): 0 Additional Comments: applied overhead frame   Braulio Bosch 09/12/2014, 6:02 PM

## 2014-09-12 NOTE — Anesthesia Postprocedure Evaluation (Signed)
  Anesthesia Post-op Note  Patient: Frank Barron  Procedure(s) Performed: Procedure(s): LEFT TOTAL KNEE ARTHROPLASTY (Left)  Patient Location: PACU  Anesthesia Type:General  Level of Consciousness: awake and alert   Airway and Oxygen Therapy: Patient Spontanous Breathing  Post-op Pain: Controlled  Post-op Assessment: Post-op Vital signs reviewed, Patient's Cardiovascular Status Stable and Respiratory Function Stable  Post-op Vital Signs: Reviewed  Filed Vitals:   09/12/14 2115  BP: 134/85  Pulse: 92  Temp:   Resp: 14    Complications: No apparent anesthesia complications

## 2014-09-12 NOTE — Anesthesia Procedure Notes (Addendum)
Procedure Name: Intubation Date/Time: 09/12/2014 2:20 PM Performed by: Rush Farmer E Pre-anesthesia Checklist: Patient identified, Emergency Drugs available, Suction available, Patient being monitored and Timeout performed Patient Re-evaluated:Patient Re-evaluated prior to inductionOxygen Delivery Method: Circle system utilized Preoxygenation: Pre-oxygenation with 100% oxygen Intubation Type: IV induction Ventilation: Mask ventilation without difficulty and Oral airway inserted - appropriate to patient size Laryngoscope Size: Mac and 4 Grade View: Grade I Tube type: Oral Tube size: 7.5 mm Number of attempts: 1 Airway Equipment and Method: Stylet Placement Confirmation: ETT inserted through vocal cords under direct vision,  positive ETCO2 and breath sounds checked- equal and bilateral Secured at: 22 cm Tube secured with: Tape Dental Injury: Teeth and Oropharynx as per pre-operative assessment and Injury to lip    Anesthesia Regional Block:  Adductor canal block  Pre-Anesthetic Checklist: ,, timeout performed, Correct Patient, Correct Site, Correct Laterality, Correct Procedure, Correct Position, site marked, Risks and benefits discussed,  Surgical consent,  Pre-op evaluation,  At surgeon's request and post-op pain management  Laterality: Left  Prep: chloraprep       Needles:  Injection technique: Single-shot  Needle Type: Echogenic Stimulator Needle     Needle Length: 9cm 9 cm Needle Gauge: 22 and 22 G    Additional Needles:  Procedures: ultrasound guided (picture in chart) Adductor canal block Narrative:  Start time: 09/12/2014 2:00 PM End time: 09/12/2014 2:10 PM Injection made incrementally with aspirations every 5 mL.  Performed by: Personally   Additional Notes: 30 cc 0.5% Bupivacaine with 1:200 epi injected easily

## 2014-09-13 ENCOUNTER — Encounter (HOSPITAL_COMMUNITY): Payer: Self-pay | Admitting: Orthopaedic Surgery

## 2014-09-13 LAB — BASIC METABOLIC PANEL
ANION GAP: 9 (ref 5–15)
BUN: 21 mg/dL — ABNORMAL HIGH (ref 6–20)
CALCIUM: 8.4 mg/dL — AB (ref 8.9–10.3)
CO2: 24 mmol/L (ref 22–32)
Chloride: 103 mmol/L (ref 101–111)
Creatinine, Ser: 1.73 mg/dL — ABNORMAL HIGH (ref 0.61–1.24)
GFR, EST AFRICAN AMERICAN: 47 mL/min — AB (ref >60)
GFR, EST NON AFRICAN AMERICAN: 41 mL/min — AB (ref >60)
Glucose, Bld: 127 mg/dL — ABNORMAL HIGH (ref 65–99)
POTASSIUM: 4.4 mmol/L (ref 3.5–5.1)
Sodium: 136 mmol/L (ref 135–145)

## 2014-09-13 LAB — CBC
HCT: 32 % — ABNORMAL LOW (ref 39.0–52.0)
Hemoglobin: 11 g/dL — ABNORMAL LOW (ref 13.0–17.0)
MCH: 30.4 pg (ref 26.0–34.0)
MCHC: 34.4 g/dL (ref 30.0–36.0)
MCV: 88.4 fL (ref 78.0–100.0)
Platelets: 156 10*3/uL (ref 150–400)
RBC: 3.62 MIL/uL — ABNORMAL LOW (ref 4.22–5.81)
RDW: 14.2 % (ref 11.5–15.5)
WBC: 5.5 10*3/uL (ref 4.0–10.5)

## 2014-09-13 NOTE — Progress Notes (Signed)
   Subjective:  Patient reports pain as moderate.  No events.  Objective:   VITALS:   Filed Vitals:   09/12/14 2115 09/12/14 2127 09/13/14 0027 09/13/14 0534  BP: 134/85 136/89 138/86 105/72  Pulse: 92 93 92 69  Temp:  98.2 F (36.8 C) 98.5 F (36.9 C) 99.8 F (37.7 C)  TempSrc:  Oral Oral Oral  Resp: 14 18 26 18   Height:      Weight:      SpO2: 100% 100% 100% 100%    Neurologically intact ABD soft Neurovascular intact Sensation intact distally Intact pulses distally Dorsiflexion/Plantar flexion intact Incision: dressing C/D/I and no drainage No cellulitis present Compartment soft   Lab Results  Component Value Date   WBC 5.5 09/13/2014   HGB 11.0* 09/13/2014   HCT 32.0* 09/13/2014   MCV 88.4 09/13/2014   PLT 156 09/13/2014     Assessment/Plan:  1 Day Post-Op   - Expected postop acute blood loss anemia - will monitor for symptoms - Up with PT/OT - DVT ppx - SCDs, ambulation, xarelto - WBAT left lower extremity - Pain control - Discharge planning  Marianna Payment 09/13/2014, 10:23 AM [phone]

## 2014-09-13 NOTE — Plan of Care (Signed)
Problem: Consults Goal: Diagnosis- Total Joint Replacement Outcome: Completed/Met Date Met:  09/13/14 Primary Total Knee left

## 2014-09-13 NOTE — Evaluation (Signed)
Physical Therapy Evaluation Patient Details Name: Frank Barron MRN: [id number] DOB: 04-07-1950 Today's Date: 09/13/2014   History of Present Illness  Pt is a 64 y/o M s/p L TKA.  Pt's PMH includes HTN, anxiety, CHF, anemia, DVT, hepatitis C, CAD, a fib, stroke w/ residual expressive aphasia, L THA, lumbar disc surgery.  Clinical Impression  Pt is s/p L TKA resulting in the deficits listed below (see PT Problem List). During ambulation pt w/ trunk flexed and L knee flexed despite verbal cues to stand upright.  Pt began to fatigue toward end of ambulation and drag RLE, slight improvement w/ cues to pick up RLE.  Assuming this foot drag was due to fatigue as pt's strength in RLE is WNL. Pt w/ expressive aphasia at baseline 2/2 h/o stroke. Pt will benefit from skilled PT to increase their independence and safety with mobility to allow discharge to the venue listed below.     Follow Up Recommendations SNF;Supervision for mobility/OOB    Equipment Recommendations  Other (comment) (TBD by next venue of care, will likely need BSC and RW)    Recommendations for Other Services OT consult     Precautions / Restrictions Precautions Precautions: Knee;Fall Precaution Booklet Issued: Yes (comment) Precaution Comments: Reviewed no pillow under knee Restrictions Weight Bearing Restrictions: Yes LLE Weight Bearing: Weight bearing as tolerated      Mobility  Bed Mobility Overal bed mobility: Needs Assistance Bed Mobility: Supine to Sit     Supine to sit: Min assist     General bed mobility comments: Min assist managing LLE and VCs for technique.  Pt used bed rail and required increased time.  Transfers Overall transfer level: Needs assistance Equipment used: Rolling walker (2 wheeled) Transfers: Sit to/from Stand Sit to Stand: Min assist         General transfer comment: Min assist to power up to standing w/ increased time and VCs for hand  placement..  Ambulation/Gait Ambulation/Gait assistance: Min assist;+2 safety/equipment Ambulation Distance (Feet): 45 Feet Assistive device: Rolling walker (2 wheeled) Gait Pattern/deviations: Step-to pattern;Decreased stride length;Shuffle;Antalgic;Trunk flexed   Gait velocity interpretation: Below normal speed for age/gender General Gait Details: Pt w/ trunk flexed and L knee flexed despite verbal cues to stand upright.  Pt began to fatigue toward end of ambulation and drag RLE, slight improvement w/ cues to pick up RLE.  Assuming this foot drag was due to fatigue as pt's strength in RLE is WNL.    Stairs            Wheelchair Mobility    Modified Rankin (Stroke Patients Only)       Balance Overall balance assessment: Needs assistance Sitting-balance support: Bilateral upper extremity supported;Feet supported Sitting balance-Leahy Scale: Fair     Standing balance support: Bilateral upper extremity supported;During functional activity Standing balance-Leahy Scale: Poor                               Pertinent Vitals/Pain Pain Assessment: 0-10 Pain Score: 5  Pain Location: L knee Pain Descriptors / Indicators: Aching;Grimacing;Moaning Pain Intervention(s): Limited activity within patient's tolerance;Monitored during session;Repositioned    Home Living Family/patient expects to be discharged to:: Skilled nursing facility Living Arrangements: Alone               Additional Comments: Two story home w/ bedroom/bathroom upstairs. Had RW from stroke 2 years ago but got rid of it when moving.  Still has cane at  home which he has been using.    Prior Function Level of Independence: Independent with assistive device(s)   Gait / Transfers Assistance Needed: Would use cane when he had pain in L knee           Hand Dominance        Extremity/Trunk Assessment               Lower Extremity Assessment: LLE deficits/detail   LLE Deficits /  Details: weakness and limited ROM as expected s/p L TKA     Communication   Communication: Expressive difficulties (expressive aphasia from previous stroke)  Cognition Arousal/Alertness: Awake/alert Behavior During Therapy: WFL for tasks assessed/performed Overall Cognitive Status: Difficult to assess                      General Comments      Exercises Total Joint Exercises Ankle Circles/Pumps: AROM;Both;10 reps;Supine Quad Sets: AROM;Both;5 reps;Supine Heel Slides: AROM;Left;5 reps;Supine Knee Flexion: AROM;Left;5 reps;Seated Goniometric ROM: 26-91      Assessment/Plan    PT Assessment Patient needs continued PT services  PT Diagnosis Difficulty walking;Abnormality of gait;Generalized weakness;Acute pain   PT Problem List Decreased strength;Decreased range of motion;Decreased activity tolerance;Decreased balance;Decreased mobility;Decreased coordination;Decreased cognition;Decreased knowledge of use of DME;Decreased safety awareness;Decreased knowledge of precautions;Decreased skin integrity;Pain  PT Treatment Interventions DME instruction;Gait training;Stair training;Functional mobility training;Therapeutic activities;Therapeutic exercise;Balance training;Neuromuscular re-education;Cognitive remediation;Patient/family education;Modalities   PT Goals (Current goals can be found in the Care Plan section) Acute Rehab PT Goals Patient Stated Goal: rehab then home PT Goal Formulation: With patient Time For Goal Achievement: 09/20/14 Potential to Achieve Goals: Good    Frequency 7X/week   Barriers to discharge Inaccessible home environment;Decreased caregiver support No assist at home, lives alone.  Bedroom/bathroom upstairs    Co-evaluation               End of Session Equipment Utilized During Treatment: Gait belt Activity Tolerance: Patient limited by fatigue Patient left: in chair;with call bell/phone within reach;with family/visitor present Nurse  Communication: Mobility status;Precautions;Weight bearing status         Time: 9323-5573 PT Time Calculation (min) (ACUTE ONLY): 36 min   Charges:   PT Evaluation $Initial PT Evaluation Tier I: 1 Procedure PT Treatments $Gait Training: 8-22 mins   PT G CodesJoslyn Hy PT, DPT [phone] Pager: [phone] 09/13/2014, 4:19 PM

## 2014-09-13 NOTE — Progress Notes (Signed)
Utilization review completed.  

## 2014-09-13 NOTE — Discharge Instructions (Signed)
INSTRUCTIONS AFTER JOINT REPLACEMENT  ° °o Remove items at home which could result in a fall. This includes throw rugs or furniture in walking pathways °o ICE to the affected joint every three hours while awake for 30 minutes at a time, for at least the first 3-5 days, and then as needed for pain and swelling.  Continue to use ice for pain and swelling. You may notice swelling that will progress down to the foot and ankle.  This is normal after surgery.  Elevate your leg when you are not up walking on it.   °o Continue to use the breathing machine you got in the hospital (incentive spirometer) which will help keep your temperature down.  It is common for your temperature to cycle up and down following surgery, especially at night when you are not up moving around and exerting yourself.  The breathing machine keeps your lungs expanded and your temperature down. ° ° °DIET:  As you were doing prior to hospitalization, we recommend a well-balanced diet. ° °DRESSING / WOUND CARE / SHOWERING ° °Keep the surgical dressing until follow up.  The dressing is water proof, so you can shower without any extra covering.  IF THE DRESSING FALLS OFF or the wound gets wet inside, change the dressing with sterile gauze.  Please use good hand washing techniques before changing the dressing.  Do not use any lotions or creams on the incision until instructed by your surgeon.   ° °ACTIVITY ° °o Increase activity slowly as tolerated, but follow the weight bearing instructions below.   °o No driving for 6 weeks or until further direction given by your physician.  You cannot drive while taking narcotics.  °o No lifting or carrying greater than 10 lbs. until further directed by your surgeon. °o Avoid periods of inactivity such as sitting longer than an hour when not asleep. This helps prevent blood clots.  °o You may return to work once you are authorized by your doctor.  ° ° ° °WEIGHT BEARING  ° °Weight bearing as tolerated with assist  device (walker, cane, etc) as directed, use it as long as suggested by your surgeon or therapist, typically at least 4-6 weeks. ° ° °EXERCISES ° °Results after joint replacement surgery are often greatly improved when you follow the exercise, range of motion and muscle strengthening exercises prescribed by your doctor. Safety measures are also important to protect the joint from further injury. Any time any of these exercises cause you to have increased pain or swelling, decrease what you are doing until you are comfortable again and then slowly increase them. If you have problems or questions, call your caregiver or physical therapist for advice.  ° °Rehabilitation is important following a joint replacement. After just a few days of immobilization, the muscles of the leg can become weakened and shrink (atrophy).  These exercises are designed to build up the tone and strength of the thigh and leg muscles and to improve motion. Often times heat used for twenty to thirty minutes before working out will loosen up your tissues and help with improving the range of motion but do not use heat for the first two weeks following surgery (sometimes heat can increase post-operative swelling).  ° °These exercises can be done on a training (exercise) mat, on the floor, on a table or on a bed. Use whatever works the best and is most comfortable for you.    Use music or television while you are exercising so that   the exercises are a pleasant break in your day. This will make your life better with the exercises acting as a break in your routine that you can look forward to.   Perform all exercises about fifteen times, three times per day or as directed.  You should exercise both the operative leg and the other leg as well. ° °Exercises include: °  °• Quad Sets - Tighten up the muscle on the front of the thigh (Quad) and hold for 5-10 seconds.   °• Straight Leg Raises - With your knee straight (if you were given a brace, keep it on),  lift the leg to 60 degrees, hold for 3 seconds, and slowly lower the leg.  Perform this exercise against resistance later as your leg gets stronger.  °• Leg Slides: Lying on your back, slowly slide your foot toward your buttocks, bending your knee up off the floor (only go as far as is comfortable). Then slowly slide your foot back down until your leg is flat on the floor again.  °• Angel Wings: Lying on your back spread your legs to the side as far apart as you can without causing discomfort.  °• Hamstring Strength:  Lying on your back, push your heel against the floor with your leg straight by tightening up the muscles of your buttocks.  Repeat, but this time bend your knee to a comfortable angle, and push your heel against the floor.  You may put a pillow under the heel to make it more comfortable if necessary.  ° °A rehabilitation program following joint replacement surgery can speed recovery and prevent re-injury in the future due to weakened muscles. Contact your doctor or a physical therapist for more information on knee rehabilitation.  ° ° °CONSTIPATION ° °Constipation is defined medically as fewer than three stools per week and severe constipation as less than one stool per week.  Even if you have a regular bowel pattern at home, your normal regimen is likely to be disrupted due to multiple reasons following surgery.  Combination of anesthesia, postoperative narcotics, change in appetite and fluid intake all can affect your bowels.  ° °YOU MUST use at least one of the following options; they are listed in order of increasing strength to get the job done.  They are all available over the counter, and you may need to use some, POSSIBLY even all of these options:   ° °Drink plenty of fluids (prune juice may be helpful) and high fiber foods °Colace 100 mg by mouth twice a day  °Senokot for constipation as directed and as needed Dulcolax (bisacodyl), take with full glass of water  °Miralax (polyethylene glycol)  once or twice a day as needed. ° °If you have tried all these things and are unable to have a bowel movement in the first 3-4 days after surgery call either your surgeon or your primary doctor.   ° °If you experience loose stools or diarrhea, hold the medications until you stool forms back up.  If your symptoms do not get better within 1 week or if they get worse, check with your doctor.  If you experience "the worst abdominal pain ever" or develop nausea or vomiting, please contact the office immediately for further recommendations for treatment. ° ° °ITCHING:  If you experience itching with your medications, try taking only a single pain pill, or even half a pain pill at a time.  You can also use Benadryl over the counter for itching or also to   help with sleep.   TED HOSE STOCKINGS:  Use stockings on both legs until for at least 2 weeks or as directed by physician office. They may be removed at night for sleeping.  MEDICATIONS:  See your medication summary on the After Visit Summary that nursing will review with you.  You may have some home medications which will be placed on hold until you complete the course of blood thinner medication.  It is important for you to complete the blood thinner medication as prescribed.  PRECAUTIONS:  If you experience chest pain or shortness of breath - call 911 immediately for transfer to the hospital emergency department.   If you develop a fever greater that 101 F, purulent drainage from wound, increased redness or drainage from wound, foul odor from the wound/dressing, or calf pain - CONTACT YOUR SURGEON.                                                   FOLLOW-UP APPOINTMENTS:  If you do not already have a post-op appointment, please call the office for an appointment to be seen by your surgeon.  Guidelines for how soon to be seen are listed in your After Visit Summary, but are typically between 1-4 weeks after surgery.  OTHER INSTRUCTIONS:   Knee  Replacement:  Do not place pillow under knee, focus on keeping the knee straight while resting. CPM instructions: 0-90 degrees, 2 hours in the morning, 2 hours in the afternoon, and 2 hours in the evening. Place foam block, curve side up under heel at all times except when in CPM or when walking.  DO NOT modify, tear, cut, or change the foam block in any way.  MAKE SURE YOU:   Understand these instructions.   Get help right away if you are not doing well or get worse.    Thank you for letting us be a part of your medical care team.  It is a privilege we respect greatly.  We hope these instructions will help you stay on track for a fast and full recovery!     Information on my medicine - XARELTO (Rivaroxaban)  This medication education was reviewed with me or my healthcare representative as part of my discharge preparation.    Why was Xarelto prescribed for you? Xarelto was prescribed for you to reduce the risk of a blood clot forming that can cause a stroke if you have a medical condition called atrial fibrillation (a type of irregular heartbeat).  What do you need to know about xarelto ? Take your Xarelto 20mg  ONCE DAILY at the same time every day with your evening meal. If you have difficulty swallowing the tablet whole, you may crush it and mix in applesauce just prior to taking your dose.  Take Xarelto exactly as prescribed by your doctor and DO NOT stop taking Xarelto without talking to the doctor who prescribed the medication.  Stopping without other stroke prevention medication to take the place of Xarelto may increase your risk of developing a clot that causes a stroke.  Refill your prescription before you run out.  After discharge, you should have regular check-up appointments with your healthcare provider that is prescribing your Xarelto.  In the future your dose may need to be changed if your kidney function or weight changes by a significant amount.  What  do you do if  you miss a dose? If you are taking Xarelto ONCE DAILY and you miss a dose, take it as soon as you remember on the same day then continue your regularly scheduled once daily regimen the next day. Do not take two doses of Xarelto at the same time or on the same day.   Important Safety Information A possible side effect of Xarelto is bleeding. You should call your healthcare provider right away if you experience any of the following: ? Bleeding from an injury or your nose that does not stop. ? Unusual colored urine (red or dark brown) or unusual colored stools (red or black). ? Unusual bruising for unknown reasons. ? A serious fall or if you hit your head (even if there is no bleeding).  Some medicines may interact with Xarelto and might increase your risk of bleeding while on Xarelto. To help avoid this, consult your healthcare provider or pharmacist prior to using any new prescription or non-prescription medications, including herbals, vitamins, non-steroidal anti-inflammatory drugs (NSAIDs) and supplements.  This website has more information on Xarelto: [email or web address]

## 2014-09-13 NOTE — Progress Notes (Signed)
Orthopedic Tech Progress Note Patient Details:  Frank Barron [birth year] [id number] On cpm at 7:25 pm Patient ID: Frank Barron, male   DOB: 1950-12-07, 64 y.o.   MRN: [id number]   Frank Barron 09/13/2014, 7:28 PM

## 2014-09-14 LAB — CBC
HEMATOCRIT: 28.7 % — AB (ref 39.0–52.0)
Hemoglobin: 9.6 g/dL — ABNORMAL LOW (ref 13.0–17.0)
MCH: 29.7 pg (ref 26.0–34.0)
MCHC: 33.4 g/dL (ref 30.0–36.0)
MCV: 88.9 fL (ref 78.0–100.0)
Platelets: 139 10*3/uL — ABNORMAL LOW (ref 150–400)
RBC: 3.23 MIL/uL — AB (ref 4.22–5.81)
RDW: 14.5 % (ref 11.5–15.5)
WBC: 7.6 10*3/uL (ref 4.0–10.5)

## 2014-09-14 NOTE — Progress Notes (Signed)
Physical Therapy Treatment Patient Details Name: Frank Barron MRN: [id number] DOB: 12-22-1950 Today's Date: 09/14/2014    History of Present Illness Pt is a 64 y/o M s/p L TKA.  Pt's PMH includes HTN, anxiety, CHF, anemia, DVT, hepatitis C, CAD, a fib, stroke w/ residual expressive aphasia, L THA, lumbar disc surgery.    PT Comments    Pt continues to be limited by pain and fatigue but improved gait mechanics w/ use of slipper on RLE.  Pt reports he has a LLD and usually wears orthotic shoe at home to offset.  Pt responded well to VC to stand upright and to pick up RLE off floor but will benefit from further practice w/ this.  Pt will benefit from continued skilled PT services to increase functional independence and safety.   Follow Up Recommendations  SNF;Supervision for mobility/OOB     Equipment Recommendations  Other (comment) (TBD by next venue of care, will likely need BSC and RW)    Recommendations for Other Services       Precautions / Restrictions Precautions Precautions: Knee;Fall Precaution Comments: Reviewed no pillow under knee Restrictions Weight Bearing Restrictions: Yes LLE Weight Bearing: Weight bearing as tolerated    Mobility  Bed Mobility Overal bed mobility: Modified Independent Bed Mobility: Supine to Sit     Supine to sit: Modified independent (Device/Increase time)     General bed mobility comments: Use of bed rails and increased time but did not require assist this session  Transfers Overall transfer level: Needs assistance Equipment used: Rolling walker (2 wheeled) Transfers: Sit to/from Stand Sit to Stand: Min assist;+2 physical assistance         General transfer comment: Min assist +2 to power up to standing.  Pt w/ extreme trunk flexion 2/2 height and hesitancy to allow knees to flex  Ambulation/Gait Ambulation/Gait assistance: Min assist;+2 safety/equipment Ambulation Distance (Feet): 50 Feet Assistive device: Rolling walker (2  wheeled) Gait Pattern/deviations: Step-to pattern;Shuffle;Antalgic;Decreased stance time - left;Trunk flexed   Gait velocity interpretation: Below normal speed for age/gender General Gait Details: Cues to pick up RLE off floor as pt has tendency to drag/shuffle.Pt responded better this session to VCs and TCs to stand upright.  Slipper was used on RLE 2/2 pt's describing LLD (usually wears orthotic shoes at home).  Pt becomes fatigued quickly.     Stairs            Wheelchair Mobility    Modified Rankin (Stroke Patients Only)       Balance Overall balance assessment: Needs assistance Sitting-balance support: Bilateral upper extremity supported;Feet supported Sitting balance-Leahy Scale: Fair     Standing balance support: Bilateral upper extremity supported;During functional activity Standing balance-Leahy Scale: Poor                      Cognition Arousal/Alertness: Awake/alert Behavior During Therapy: WFL for tasks assessed/performed Overall Cognitive Status: Difficult to assess                      Exercises Total Joint Exercises Short Arc QuadSinclair Ship;Left;5 reps;Seated Knee Flexion: AROM;Left;5 reps;Seated Goniometric ROM: 24-85 Marching in Standing: AROM;Both;5 reps;Standing    General Comments        Pertinent Vitals/Pain Pain Assessment: Faces Faces Pain Scale: Hurts even more Pain Location: L knee Pain Descriptors / Indicators: Grimacing;Guarding;Heaviness;Moaning Pain Intervention(s): Limited activity within patient's tolerance;Monitored during session;Repositioned    Home Living  Prior Function            PT Goals (current goals can now be found in the care plan section) Acute Rehab PT Goals Patient Stated Goal: rehab then home PT Goal Formulation: With patient Time For Goal Achievement: 09/20/14 Potential to Achieve Goals: Good Progress towards PT goals: Progressing toward goals    Frequency   7X/week    PT Plan Current plan remains appropriate    Co-evaluation             End of Session Equipment Utilized During Treatment: Gait belt Activity Tolerance: Patient limited by fatigue;Patient limited by pain Patient left: in chair;with call bell/phone within reach     Time: 0904-0930 PT Time Calculation (min) (ACUTE ONLY): 26 min  Charges:  $Gait Training: 8-22 mins $Therapeutic Exercise: 8-22 mins                    G Codes:      Joslyn Hy PT, [place] [phone] Pager: [phone] 09/14/2014, 1:31 PM

## 2014-09-14 NOTE — Progress Notes (Signed)
   Subjective:  Patient reports pain as moderate.  No events.  Objective:   VITALS:   Filed Vitals:   09/13/14 0027 09/13/14 0534 09/13/14 2047 09/14/14 0457  BP: 138/86 105/72 116/69 111/72  Pulse: 92 69 65 61  Temp: 98.5 F (36.9 C) 99.8 F (37.7 C) 99.2 F (37.3 C) 98 F (36.7 C)  TempSrc: Oral Oral Oral Oral  Resp: 26 18 20 18   Height:      Weight:      SpO2: 100% 100% 99% 100%    Incision c/d/i   Lab Results  Component Value Date   WBC 7.6 09/14/2014   HGB 9.6* 09/14/2014   HCT 28.7* 09/14/2014   MCV 88.9 09/14/2014   PLT 139* 09/14/2014     Assessment/Plan:  2 Days Post-Op   - Expected postop acute blood loss anemia - will monitor for symptoms - Up with PT/OT - DVT ppx - SCDs, ambulation, xarelto - WBAT left lower extremity - Pain control - Discharge planning - home tomorrow - dressing changed  Marianna Payment 09/14/2014, 10:02 AM [phone]

## 2014-09-14 NOTE — Progress Notes (Signed)
OT Cancellation Note  Patient Details Name: Frank Barron MRN: [id number] DOB: [birth year]   Cancelled Treatment:    Reason Eval/Treat Not Completed: Other (comment) (OT screened). Pt's current D/C plan is SNF. No apparent immediate acute care OT needs, therefore will defer OT to SNF. If OT eval is needed please call Acute Rehab Dept. at [phone] or text page OT at [phone].    Benito Mccreedy OTR/L [phone] 09/14/2014, 7:59 AM

## 2014-09-15 LAB — CBC
HCT: 25 % — ABNORMAL LOW (ref 39.0–52.0)
Hemoglobin: 8.4 g/dL — ABNORMAL LOW (ref 13.0–17.0)
MCH: 29.8 pg (ref 26.0–34.0)
MCHC: 33.6 g/dL (ref 30.0–36.0)
MCV: 88.7 fL (ref 78.0–100.0)
PLATELETS: 123 10*3/uL — AB (ref 150–400)
RBC: 2.82 MIL/uL — ABNORMAL LOW (ref 4.22–5.81)
RDW: 14.2 % (ref 11.5–15.5)
WBC: 7.4 10*3/uL (ref 4.0–10.5)

## 2014-09-15 NOTE — Progress Notes (Signed)
Pt resting, breathing noted.   

## 2014-09-15 NOTE — Clinical Social Work Note (Signed)
Clinical Social Work Assessment  Patient Details  Name: Frank Barron MRN: [id number] Date of Birth: 13-Aug-1950  Date of referral:  09/14/14               Reason for consult:  Facility Placement                Permission sought to share information with:  Family Supports Permission granted to share information::  Yes, Verbal Permission Granted  Name::        Agency::     Relationship::     Contact Information:     Housing/Transportation Living arrangements for the past 2 months:  Apartment Source of Information:  Patient Patient Interpreter Needed:  None Criminal Activity/Legal Involvement Pertinent to Current Situation/Hospitalization:  No - Comment as needed Significant Relationships:  Parents Lives with:  Self Do you feel safe going back to the place where you live?  Yes Need for family participation in patient care:  No (Coment) (Patient states that he does not need anyone to help with his decision making. However patient exhibited some challenges with communicating clearly. He mixed up his words several times n our conversation but when asked directly he could clarify. )  Care giving concerns: Patient agreeable that he needed further support and care in order to improve his health and be restored to independent living. Patient willing to go to SNF facility and is willing to work with [facility] in order to find placement.    Social Worker assessment / plan:  CSW will initiate placement and follow up with patient and appropriate facilities for disposition and discharge.   Employment status:  Disabled (Comment on whether or not currently receiving Disability) Insurance information:  [facility] Benefit PT Recommendations:  [place] / Referral to community resources:     Patient/Family's Response to care: Patient was agreeable and looking forward to discharge.   Patient/Family's Understanding of and Emotional Response to Diagnosis, Current Treatment, and Prognosis:   Patient expecting a full recovery after rehab.   Emotional Assessment Appearance:  Appears stated age Attitude/Demeanor/Rapport:   (appropriate) Affect (typically observed):  Accepting, Appropriate Orientation:  Oriented to Self, Oriented to Place, Oriented to  Time, Oriented to Situation Alcohol / Substance use:  Not Applicable Psych involvement (Current and /or in the community):  No (Comment)  Discharge Needs  Concerns to be addressed:  Cognitive Concerns, Financial / Insurance Concerns (Documentation for [facility] may delary SNF placement) Readmission within the last 30 days:  No Current discharge risk:  None Barriers to Discharge:  No Barriers Identified   , Daneil Dolin, LCSW 09/15/2014, 1:47 PM

## 2014-09-15 NOTE — Progress Notes (Signed)
   Subjective:  Feeling better today  Objective:   VITALS:   Filed Vitals:   09/13/14 2047 09/14/14 0457 09/14/14 1938 09/15/14 0436  BP: 116/69 111/72 107/67 106/66  Pulse: 65 61 74 78  Temp: 99.2 F (37.3 C) 98 F (36.7 C) 99.7 F (37.6 C) 98.8 F (37.1 C)  TempSrc: Oral Oral Oral Oral  Resp: 20 18 17 16   Height:      Weight:      SpO2: 99% 100% 99% 100%    Dressing ok Ambulating in hall with PT Feeling better today   Lab Results  Component Value Date   WBC 7.4 09/15/2014   HGB 8.4* 09/15/2014   HCT 25.0* 09/15/2014   MCV 88.7 09/15/2014   PLT 123* 09/15/2014     Assessment/Plan:  3 Days Post-Op   - Expected postop acute blood loss anemia - will monitor for symptoms - Up with PT/OT - SNF - repeat CBC in am - likely dc to SNF tuesday  Marianna Payment 09/15/2014, 10:37 AM [phone]

## 2014-09-15 NOTE — Progress Notes (Signed)
Physical Therapy Treatment Patient Details Name: Frank Barron MRN: [id number] DOB: [birth year] Today's Date: 09/15/2014    History of Present Illness Pt is a 64 y/o M s/p L TKA.  Pt's PMH includes HTN, anxiety, CHF, anemia, DVT, hepatitis C, CAD, a fib, stroke w/ residual expressive aphasia, L THA, lumbar disc surgery.    PT Comments    Pt responds better to TCs w/ improved gait mechanics this session (see notes below).  Focus on allowing B knees to bend, to stand upright, and to allow RW to roll rather than picking it up while ambulating.  Pt will benefit from continued skilled PT services to increase functional independence and safety.   Follow Up Recommendations  SNF;Supervision for mobility/OOB     Equipment Recommendations  Other (comment) (TBD by next venue of care, will likely need BSC and RW)    Recommendations for Other Services       Precautions / Restrictions Precautions Precautions: Knee;Fall Precaution Comments: Reviewed no pillow under knee Restrictions Weight Bearing Restrictions: Yes LLE Weight Bearing: Weight bearing as tolerated    Mobility  Bed Mobility Overal bed mobility: Modified Independent Bed Mobility: Supine to Sit     Supine to sit: Modified independent (Device/Increase time)     General bed mobility comments: No use of bed rails this session, increased time  Transfers Overall transfer level: Needs assistance Equipment used: Rolling walker (2 wheeled) Transfers: Sit to/from Stand Sit to Stand: Min assist;Min guard         General transfer comment: Min assist and eventually min guard assist after multiple repetitions w/ VCs for proper technique.  Pt has tendency to flex trunk causing slight LOB.  Once provided w/ cues to bring chest and head up during sit>stand, pt w/ improved stability and safety.    Ambulation/Gait Ambulation/Gait assistance: Min assist;+2 safety/equipment Ambulation Distance (Feet): 70 Feet Assistive device: Rolling  walker (2 wheeled) Gait Pattern/deviations: Step-to pattern;Step-through pattern;Antalgic;Decreased stride length;Trunk flexed   Gait velocity interpretation: Below normal speed for age/gender General Gait Details: Cues to allow B knees to bend to clear feet from floor during ambulation.  Cues to stand upright, pt responds better to TCs than VCs.  Cues to leave RW on floor and rolling it rather than picking it up.  Pt able to demonstrate all of these modifications but is unable to keep technique over time and requires max VCs and TCs.   Stairs            Wheelchair Mobility    Modified Rankin (Stroke Patients Only)       Balance Overall balance assessment: Needs assistance Sitting-balance support: No upper extremity supported;Feet supported Sitting balance-Leahy Scale: Good     Standing balance support: Bilateral upper extremity supported;During functional activity Standing balance-Leahy Scale: Fair                      Cognition Arousal/Alertness: Awake/alert Behavior During Therapy: WFL for tasks assessed/performed Overall Cognitive Status: Difficult to assess                      Exercises Total Joint Exercises Ankle Circles/Pumps: AROM;Both;10 reps;Seated Long Arc Quad: AROM;Left;10 reps;Seated Knee Flexion: AROM;Left;Seated;10 reps Goniometric ROM: 90 deg L knee flexion    General Comments General comments (skin integrity, edema, etc.): Pt continues to be limited by expressive aphasia difficulties but is making modest progress.      Pertinent Vitals/Pain Pain Assessment: 0-10 Pain Score: 3  Pain Location:  L knee Pain Descriptors / Indicators: Aching;Grimacing;Guarding;Discomfort Pain Intervention(s): Limited activity within patient's tolerance;Monitored during session;Repositioned    Home Living                      Prior Function            PT Goals (current goals can now be found in the care plan section) Acute Rehab PT  Goals Patient Stated Goal: rehab then home PT Goal Formulation: With patient Time For Goal Achievement: 09/20/14 Potential to Achieve Goals: Good Progress towards PT goals: Progressing toward goals    Frequency  7X/week    PT Plan Current plan remains appropriate    Co-evaluation             End of Session Equipment Utilized During Treatment: Gait belt Activity Tolerance: Patient limited by fatigue;Patient tolerated treatment well Patient left: in chair;with call bell/phone within reach     Time: 1013-1040 PT Time Calculation (min) (ACUTE ONLY): 27 min  Charges:  $Gait Training: 8-22 mins $Therapeutic Exercise: 8-22 mins                    G Codes:      Joslyn Hy PT, [place] [phone] Pager: [phone] 09/15/2014, 12:42 PM

## 2014-09-16 LAB — CBC
HCT: 24 % — ABNORMAL LOW (ref 39.0–52.0)
HEMOGLOBIN: 8.1 g/dL — AB (ref 13.0–17.0)
MCH: 29.9 pg (ref 26.0–34.0)
MCHC: 33.8 g/dL (ref 30.0–36.0)
MCV: 88.6 fL (ref 78.0–100.0)
PLATELETS: 150 10*3/uL (ref 150–400)
RBC: 2.71 MIL/uL — ABNORMAL LOW (ref 4.22–5.81)
RDW: 14.2 % (ref 11.5–15.5)
WBC: 5.5 10*3/uL (ref 4.0–10.5)

## 2014-09-16 NOTE — Progress Notes (Signed)
   Subjective:  Feeling well  Objective:   VITALS:   Filed Vitals:   09/15/14 2125 09/16/14 0600 09/16/14 0601 09/16/14 0611  BP: 110/73 88/64 89/57  103/65  Pulse: 79 92  74  Temp:  98.7 F (37.1 C)    TempSrc:  Oral    Resp:      Height:      Weight:      SpO2:  99%      Dressing ok    Lab Results  Component Value Date   WBC 7.4 09/15/2014   HGB 8.4* 09/15/2014   HCT 25.0* 09/15/2014   MCV 88.7 09/15/2014   PLT 123* 09/15/2014     Assessment/Plan:  4 Days Post-Op   - Expected postop acute blood loss anemia - will monitor for symptoms - Up with PT/OT - SNF - SNF Tuesday - CBC ordered  Marianna Payment 09/16/2014, 6:23 AM [phone]

## 2014-09-16 NOTE — Clinical Social Work Note (Signed)
CSW sent SNF authorization request/referral to the [place] for review.  [facility] office closed due to observation of holiday.  Will reopen on Tuesday 09/17/2014.  CSW will follow-up.  Nonnie Done, LCSW [phone]  Psychiatric & Orthopedics (5N 1-8) Clinical Social Worker

## 2014-09-16 NOTE — Progress Notes (Signed)
Physical Therapy Treatment Patient Details Name: Frank Barron MRN: [id number] DOB: Jul 24, 1950 Today's Date: 09/16/2014    History of Present Illness Pt is a 64 y/o M s/p L TKA.  Pt's PMH includes HTN, anxiety, CHF, anemia, DVT, hepatitis C, CAD, a fib, stroke w/ residual expressive aphasia, L THA, lumbar disc surgery.    PT Comments    Pt required less frequent VCs this session for improved gait mechanics and continues to make modest progress toward therapy goals.  Pt will benefit from continued skilled PT services to increase functional independence and safety.   Follow Up Recommendations  SNF;Supervision for mobility/OOB     Equipment Recommendations  Other (comment) (TBD by next venue of care, will likely need BSC and RW)    Recommendations for Other Services       Precautions / Restrictions Precautions Precautions: Knee;Fall Restrictions Weight Bearing Restrictions: Yes LLE Weight Bearing: Weight bearing as tolerated    Mobility  Bed Mobility               General bed mobility comments: in recliner  Transfers Overall transfer level: Needs assistance Equipment used: Rolling walker (2 wheeled) Transfers: Sit to/from Stand Sit to Stand: Supervision         General transfer comment: Supervision for safety.  Pt w/ good carryover from previous session w/ scooting toward edge of seat and standing uprigth rather than flexing trunk when pushing upright  Ambulation/Gait Ambulation/Gait assistance: Supervision Ambulation Distance (Feet): 70 Feet Assistive device: Rolling walker (2 wheeled) Gait Pattern/deviations: Step-to pattern;Antalgic;Trunk flexed;Decreased stance time - left   Gait velocity interpretation: Below normal speed for age/gender General Gait Details: Min VCs to allow B knee to bed, maintain RW on floor, and stand upright.  Pt able to demonstrate these technique w/ success this session and dec VCs.   Stairs            Wheelchair Mobility    Modified Rankin (Stroke Patients Only)       Balance Overall balance assessment: Needs assistance Sitting-balance support: No upper extremity supported;Feet supported Sitting balance-Leahy Scale: Good     Standing balance support: Bilateral upper extremity supported;During functional activity Standing balance-Leahy Scale: Fair                      Cognition Arousal/Alertness: Awake/alert Behavior During Therapy: WFL for tasks assessed/performed Overall Cognitive Status: Difficult to assess                      Exercises Total Joint Exercises Long Arc Quad: AROM;Left;10 reps;Seated Knee Flexion: AROM;Left;Seated;10 reps Goniometric ROM: 88 deg L knee flexion    General Comments        Pertinent Vitals/Pain Pain Assessment: Faces Faces Pain Scale: Hurts little more Pain Location: L knee Pain Descriptors / Indicators: Aching Pain Intervention(s): Limited activity within patient's tolerance;Monitored during session;Repositioned    Home Living                      Prior Function            PT Goals (current goals can now be found in the care plan section) Acute Rehab PT Goals Patient Stated Goal: rehab then home PT Goal Formulation: With patient Time For Goal Achievement: 09/20/14 Potential to Achieve Goals: Good Progress towards PT goals: Progressing toward goals    Frequency  7X/week    PT Plan Current plan remains appropriate    Co-evaluation  End of Session Equipment Utilized During Treatment: Gait belt Activity Tolerance: Patient limited by fatigue;Patient tolerated treatment well Patient left: in chair;with call bell/phone within reach     Time: 2563-8937 PT Time Calculation (min) (ACUTE ONLY): 15 min  Charges:  $Gait Training: 8-22 mins                    G Codes:      Joslyn Hy PT, [place] [phone] Pager: [phone] 09/16/2014, 11:23 AM

## 2014-09-16 NOTE — Clinical Social Work Placement (Signed)
   CLINICAL SOCIAL WORK PLACEMENT  NOTE  Date:  09/16/2014  Patient Details  Name: Frank Barron MRN: [id number] Date of Birth: 03-Nov-1950  Clinical Social Work is seeking post-discharge placement for this patient at the [place] level of care (*CSW will initial, date and re-position this form in  chart as items are completed):  Yes   Patient/family provided with [facility] Work Department's list of facilities offering this level of care within the geographic area requested by the patient (or if unable, by the patient's family).  Yes   Patient/family informed of their freedom to choose among providers that offer the needed level of care, that participate in Medicare, Medicaid or managed care program needed by the patient, have an available bed and are willing to accept the patient.  Yes   Patient/family informed of [facility]'s ownership interest in [facility] and [facility] as well as of the fact that they are under no obligation to receive care at these facilities.  PASRR submitted to EDS on 09/16/14     PASRR number received on 09/16/14     Existing PASRR number confirmed on       FL2 transmitted to all facilities in geographic area requested by pt/family on 09/16/14     FL2 transmitted to all facilities within larger geographic area on 09/16/14     Patient informed that his/her managed care company has contracts with or will negotiate with certain facilities, including the following:            Patient/family informed of bed offers received.  Patient chooses bed at       Physician recommends and patient chooses bed at      Patient to be transferred to   on  .  Patient to be transferred to facility by       Patient family notified on   of transfer.  Name of family member notified:        PHYSICIAN       Additional Comment:    _______________________________________________ Dulcy Fanny, LCSW 09/16/2014, 10:43 AM

## 2014-09-16 NOTE — Progress Notes (Signed)
Pt resting, breathing noted.   

## 2014-09-16 NOTE — Progress Notes (Signed)
Orthopedic Tech Progress Note Patient Details:  Frank Barron [birth year] [id number]  Patient ID: Frank Barron, male   DOB: 1950/12/10, 64 y.o.   MRN: [id number] Placed pt's lle on cpm @0 -60 degrees @1400   Hildred Priest 09/16/2014, 1:48 PM

## 2014-09-17 NOTE — Progress Notes (Signed)
Physical Therapy Treatment Patient Details Name: Frank Barron MRN: [id number] DOB: 1951/01/17 Today's Date: 09/17/2014    History of Present Illness Pt is a 64 y/o M s/p L TKA.  Pt's PMH includes HTN, anxiety, CHF, anemia, DVT, hepatitis C, CAD, a fib, stroke w/ residual expressive aphasia, L THA, lumbar disc surgery.    PT Comments    Pt is making modest progress w/ therapy, limited by fatigue w/ ambulation.  Therapeutic exercises progressed this session.  Pt requires supervision for all mobility.  Pt will benefit from continued skilled PT services to increase functional independence and safety.   Follow Up Recommendations  SNF;Supervision for mobility/OOB     Equipment Recommendations  Other (comment) (TBD by next venue of care, will likely need BSC and RW)    Recommendations for Other Services       Precautions / Restrictions Precautions Precautions: Knee;Fall Restrictions Weight Bearing Restrictions: Yes LLE Weight Bearing: Weight bearing as tolerated    Mobility  Bed Mobility Overal bed mobility: Modified Independent Bed Mobility: Supine to Sit     Supine to sit: Modified independent (Device/Increase time);HOB elevated     General bed mobility comments: No physical assist required.  Increased time.  Transfers Overall transfer level: Needs assistance Equipment used: Rolling walker (2 wheeled) Transfers: Sit to/from Stand Sit to Stand: Supervision         General transfer comment: Supervision for safety.  Pt w/ good carryover from previous session w/ scooting toward edge of seat and standing uprigth rather than flexing trunk when pushing upright  Ambulation/Gait Ambulation/Gait assistance: Supervision Ambulation Distance (Feet): 70 Feet Assistive device: Rolling walker (2 wheeled) Gait Pattern/deviations: Step-to pattern;Antalgic;Trunk flexed;Decreased stride length   Gait velocity interpretation: Below normal speed for age/gender General Gait Details:  Pt only required min reminders to stand upright using TCs.  Pt remembered to roll RW and pick up feet B while ambulating.   Stairs            Wheelchair Mobility    Modified Rankin (Stroke Patients Only)       Balance Overall balance assessment: Needs assistance Sitting-balance support: No upper extremity supported;Feet supported Sitting balance-Leahy Scale: Good     Standing balance support: Bilateral upper extremity supported;During functional activity Standing balance-Leahy Scale: Fair                      Cognition Arousal/Alertness: Awake/alert Behavior During Therapy: WFL for tasks assessed/performed Overall Cognitive Status: Difficult to assess                      Exercises Total Joint Exercises Hip ABduction/ADduction: AROM;Left;10 reps;Standing Knee Flexion: AROM;Left;Seated;10 reps;Standing Goniometric ROM: 83 deg L knee flexion General Exercises - Lower Extremity Mini-Sqauts: Strengthening;Both;15 reps;Standing    General Comments        Pertinent Vitals/Pain Pain Assessment: Faces Faces Pain Scale: Hurts a little bit Pain Location: L knee Pain Descriptors / Indicators: Grimacing;Guarding Pain Intervention(s): Limited activity within patient's tolerance;Monitored during session;Repositioned    Home Living                      Prior Function            PT Goals (current goals can now be found in the care plan section) Acute Rehab PT Goals Patient Stated Goal: rehab then home PT Goal Formulation: With patient Time For Goal Achievement: 09/20/14 Potential to Achieve Goals: Good Progress towards PT goals: Progressing  toward goals    Frequency  7X/week    PT Plan Current plan remains appropriate    Co-evaluation             End of Session Equipment Utilized During Treatment: Gait belt Activity Tolerance: Patient limited by fatigue;Patient tolerated treatment well Patient left: in chair;with call  bell/phone within reach;with family/visitor present     Time: 0932-1001 PT Time Calculation (min) (ACUTE ONLY): 29 min  Charges:  $Gait Training: 8-22 mins $Therapeutic Exercise: 8-22 mins                    G Codes:      Joslyn Hy PT, [place] [phone] Pager: [phone] 09/17/2014, 11:21 AM

## 2014-09-17 NOTE — Progress Notes (Signed)
   Subjective:  Feeling well  Objective:   VITALS:   Filed Vitals:   09/16/14 0611 09/16/14 1258 09/16/14 2105 09/17/14 0513  BP: 103/65 99/68 110/66 104/72  Pulse: 74 75 88 72  Temp:  98.5 F (36.9 C) 98.5 F (36.9 C) 98.5 F (36.9 C)  TempSrc:      Resp:  16 18 16   Height:      Weight:      SpO2:  99% 100% 100%    Incision c/d/i    Lab Results  Component Value Date   WBC 5.5 09/16/2014   HGB 8.1* 09/16/2014   HCT 24.0* 09/16/2014   MCV 88.6 09/16/2014   PLT 150 09/16/2014     Assessment/Plan:  5 Days Post-Op   - Expected postop acute blood loss anemia - will monitor for symptoms - Up with PT/OT - SNF - SNF today - Hgb stable  Marianna Payment 09/17/2014, 7:28 AM [phone]

## 2014-09-17 NOTE — Progress Notes (Signed)
Orthopedic Tech Progress Note Patient Details:  Frank Barron 01/29/1951 [id number]  Patient ID: [name], male   DOB: 1951-01-03, 64 y.o.   MRN: [id number] Placed pt's  lle on cpm @0 -70 degrees @1405   Hildred Priest 09/17/2014, 2:06 PM

## 2014-09-17 NOTE — Clinical Social Work Note (Signed)
CSW contacted the [facility] [phone] to determine the status of the request for SNF placement.  Geraldo Docker, Social Worker states the referrals from the weekend and yesterday (holiday) will be reviewed today by the medical committee at [facility] states CSW should anticipate a return call from the [place] with a decision by 3pm today.  CSW attempted to confirm receipt of the faxed referral.  Geraldo Docker states that the reviewing committee is in [place] and gave [facility] and [facility] [phone] for follow-up.  CSW left both social workers a message to confirm receipt of referral.  Of note, CSW did receive a fax confirmation yesterday (holiday 09/16/2014) after faxing the referral to the [place].    Disposition[facility] SNF[facility]  Nonnie Done, LCSW [phone]  Psychiatric & Orthopedics (5N 1-8) Clinical Social Worker

## 2014-09-17 NOTE — Progress Notes (Signed)
Physical Therapy Treatment Patient Details Name: Frank Barron MRN: [id number] DOB: 1950/08/09 Today's Date: 09/17/2014    History of Present Illness Pt is a 64 y/o M s/p L TKA.  Pt's PMH includes HTN, anxiety, CHF, anemia, DVT, hepatitis C, CAD, a fib, stroke w/ residual expressive aphasia, L THA, lumbar disc surgery.    PT Comments    Pt continues to make modest progress this session w/ improved ambulatory endurance.  Pt will benefit from continued skilled PT services to increase functional independence and safety.  Follow Up Recommendations  SNF;Supervision for mobility/OOB     Equipment Recommendations  Other (comment) (TBD by next venue of care, will likely need BSC and RW)    Recommendations for Other Services       Precautions / Restrictions Precautions Precautions: Knee;Fall Restrictions Weight Bearing Restrictions: Yes LLE Weight Bearing: Weight bearing as tolerated    Mobility  Bed Mobility Overal bed mobility: Modified Independent Bed Mobility: Supine to Sit;Sit to Supine     Supine to sit: Modified independent (Device/Increase time);HOB elevated Sit to supine: Modified independent (Device/Increase time)   General bed mobility comments: No physical assist required.  Increased time.  Transfers Overall transfer level: Needs assistance Equipment used: Rolling walker (2 wheeled) Transfers: Sit to/from Stand Sit to Stand: Supervision         General transfer comment: Supervision for safety.  Good carryover for technique.  Ambulation/Gait Ambulation/Gait assistance: Supervision Ambulation Distance (Feet): 75 Feet Assistive device: Rolling walker (2 wheeled) Gait Pattern/deviations: Step-to pattern;Antalgic;Trunk flexed;Decreased weight shift to left   Gait velocity interpretation: Below normal speed for age/gender General Gait Details: Pt only required min reminders to stand upright using TCs.  Pt remembered to roll RW and pick up feet B while  ambulating.   Stairs            Wheelchair Mobility    Modified Rankin (Stroke Patients Only)       Balance Overall balance assessment: Needs assistance Sitting-balance support: No upper extremity supported;Feet supported Sitting balance-Leahy Scale: Good     Standing balance support: Bilateral upper extremity supported;During functional activity Standing balance-Leahy Scale: Fair                      Cognition Arousal/Alertness: Awake/alert Behavior During Therapy: WFL for tasks assessed/performed Overall Cognitive Status: Difficult to assess                      Exercises      General Comments        Pertinent Vitals/Pain Pain Assessment: Faces Faces Pain Scale: Hurts a little bit Pain Location: L knee Pain Descriptors / Indicators: Grimacing;Guarding Pain Intervention(s): Limited activity within patient's tolerance;Monitored during session;Repositioned    Home Living                      Prior Function            PT Goals (current goals can now be found in the care plan section) Acute Rehab PT Goals Patient Stated Goal: rehab then home PT Goal Formulation: With patient Time For Goal Achievement: 09/20/14 Potential to Achieve Goals: Good Progress towards PT goals: Progressing toward goals    Frequency  7X/week    PT Plan Current plan remains appropriate    Co-evaluation             End of Session Equipment Utilized During Treatment: Gait belt Activity Tolerance: Patient limited by fatigue;Patient  tolerated treatment well Patient left: in bed;with call bell/phone within reach;in CPM;with family/visitor present     Time: 7897-8478 PT Time Calculation (min) (ACUTE ONLY): 23 min  Charges:  $Gait Training: 23-37 mins                    G Codes:      Joslyn Hy PT, [place] [phone] Pager: [phone] 09/17/2014, 3:52 PM

## 2014-09-18 NOTE — Progress Notes (Signed)
Orthopedic Tech Progress Note Patient Details:  Frank Barron 1950/06/14 [id number]  Patient ID: [name], male   DOB: [birth year], 64 y.o.   MRN: [id number] Placed pt's lle on cpm @ 0-60 degrees@ 1400  Hildred Priest 09/18/2014, 1:53 PM

## 2014-09-18 NOTE — Progress Notes (Signed)
Orthopedic Tech Progress Note Patient Details:  Jahfari Ambers Sep 16, 1950 [id number] On cpm at 3:55 pm Patient ID: Mitcheal Sweetin, male   DOB: 1950-05-03, 64 y.o.   MRN: [id number]   Braulio Bosch 09/18/2014, 3:54 PM

## 2014-09-18 NOTE — Discharge Planning (Signed)
Patient has been accepted to [facility] on 09/19/2014. Transportation scheduled for 8am via EMS (per [place]). RN to call report to: [place], [place] [phone]) and Surgical [phone])

## 2014-09-18 NOTE — Progress Notes (Signed)
Physical Therapy Treatment Patient Details Name: Frank Barron MRN: [id number] DOB: Feb 09, 1951 Today's Date: 09/18/2014    History of Present Illness Pt is a 64 y/o M s/p L TKA.  Pt's PMH includes HTN, anxiety, CHF, anemia, DVT, hepatitis C, CAD, a fib, stroke w/ residual expressive aphasia, L THA, lumbar disc surgery.    PT Comments    Pt is making good progress w/ therapy w/ improved gait mechanics this session.  Encouraged pt to continue w/ quad sets as pt has poor extension.  Pt will benefit from continued skilled PT services to increase functional independence and safety.   Follow Up Recommendations  SNF;Supervision for mobility/OOB     Equipment Recommendations  Other (comment) (TBD by next venue of care, will likely need BSC and RW)    Recommendations for Other Services       Precautions / Restrictions Precautions Precautions: Knee;Fall Precaution Comments: Pt has custom orthotic shoes that should be worn when ambulating Restrictions Weight Bearing Restrictions: Yes LLE Weight Bearing: Weight bearing as tolerated    Mobility  Bed Mobility Overal bed mobility: Modified Independent Bed Mobility: Supine to Sit;Sit to Supine     Supine to sit: Modified independent (Device/Increase time);HOB elevated Sit to supine: Modified independent (Device/Increase time)   General bed mobility comments: No physical assist required.  Increased time.  Transfers Overall transfer level: Needs assistance Equipment used: Rolling walker (2 wheeled) Transfers: Sit to/from Stand Sit to Stand: Supervision         General transfer comment: Supervision for safety.  Good carryover for technique.  Ambulation/Gait Ambulation/Gait assistance: Supervision Ambulation Distance (Feet): 95 Feet Assistive device: Rolling walker (2 wheeled) Gait Pattern/deviations: Step-to pattern;Step-through pattern;Antalgic;Trunk flexed;Decreased stride length;Decreased weight shift to left Gait velocity:  0.71 ft/sec Gait velocity interpretation: Below normal speed for age/gender General Gait Details: Pt only required min reminders to stand upright using TCs.  Pt did not require cues to relax shoulders and offload BUEs.   Stairs            Wheelchair Mobility    Modified Rankin (Stroke Patients Only)       Balance Overall balance assessment: Needs assistance Sitting-balance support: No upper extremity supported;Feet supported Sitting balance-Leahy Scale: Good     Standing balance support: Bilateral upper extremity supported;During functional activity Standing balance-Leahy Scale: Fair                      Cognition Arousal/Alertness: Awake/alert Behavior During Therapy: WFL for tasks assessed/performed Overall Cognitive Status: Difficult to assess                      Exercises Total Joint Exercises Quad Sets: AROM;Both;5 reps;Supine Knee Flexion: AROM;Left;5 reps;Seated Goniometric ROM: 92 deg L knee flexion Marching in Standing: AROM;Both;10 reps;Standing General Exercises - Lower Extremity Mini-Sqauts: Strengthening;Both;Standing;10 reps    General Comments        Pertinent Vitals/Pain Pain Assessment: 0-10 Pain Score: 3  Pain Location: L knee w/ mobility Pain Descriptors / Indicators: Aching;Grimacing Pain Intervention(s): Limited activity within patient's tolerance;Monitored during session;Repositioned    Home Living                      Prior Function            PT Goals (current goals can now be found in the care plan section) Acute Rehab PT Goals Patient Stated Goal: rehab then home PT Goal Formulation: With patient Time For Goal  Achievement: 09/20/14 Potential to Achieve Goals: Good Progress towards PT goals: Progressing toward goals    Frequency  7X/week    PT Plan Current plan remains appropriate    Co-evaluation             End of Session Equipment Utilized During Treatment: Gait belt Activity  Tolerance: Patient limited by fatigue;Patient tolerated treatment well Patient left: in bed;with call bell/phone within reach;with family/visitor present     Time: 0867-6195 PT Time Calculation (min) (ACUTE ONLY): 21 min  Charges:  $Gait Training: 8-22 mins $Therapeutic Exercise: 8-22 mins                    G Codes:      Joslyn Hy PT, DPT [phone] Pager: [phone] 09/18/2014, 3:38 PM

## 2014-09-18 NOTE — Progress Notes (Signed)
Physical Therapy Treatment Patient Details Name: Frank Barron MRN: [id number] DOB: 1950/05/26 Today's Date: 09/18/2014    History of Present Illness Pt is a 64 y/o M s/p L TKA.  Pt's PMH includes HTN, anxiety, CHF, anemia, DVT, hepatitis C, CAD, a fib, stroke w/ residual expressive aphasia, L THA, lumbar disc surgery.    PT Comments    Encouraged pt to dec WB through BUEs on RW this session to strengthen and utilize BLEs while ambulating.  Progressed therapeutic exercises this session.  Pt will benefit from continued skilled PT services to increase functional independence and safety.   Follow Up Recommendations  SNF;Supervision for mobility/OOB     Equipment Recommendations  Other (comment) (TBD by next venue of care, will likely need BSC and RW)    Recommendations for Other Services       Precautions / Restrictions Precautions Precautions: Knee;Fall Precaution Comments: Pt has custom orthotic shoes that should be worn when ambulating Restrictions Weight Bearing Restrictions: Yes LLE Weight Bearing: Weight bearing as tolerated    Mobility  Bed Mobility Overal bed mobility: Modified Independent Bed Mobility: Supine to Sit     Supine to sit: Modified independent (Device/Increase time);HOB elevated     General bed mobility comments: No physical assist required.  Increased time.  Transfers Overall transfer level: Needs assistance Equipment used: Rolling walker (2 wheeled) Transfers: Sit to/from Stand Sit to Stand: Supervision         General transfer comment: Supervision for safety.  Good carryover for technique.  Ambulation/Gait Ambulation/Gait assistance: Supervision Ambulation Distance (Feet): 90 Feet Assistive device: Rolling walker (2 wheeled) Gait Pattern/deviations: Step-to pattern;Antalgic;Decreased stride length;Decreased weight shift to left;Trunk flexed Gait velocity: 0.71 ft/sec Gait velocity interpretation: <1.8 ft/sec, indicative of risk for  recurrent falls General Gait Details: Pt only required min reminders to stand upright using TCs.  Cues to dec WB through BUEs to beging placing more weight through BLEs.   Stairs            Wheelchair Mobility    Modified Rankin (Stroke Patients Only)       Balance Overall balance assessment: Needs assistance Sitting-balance support: No upper extremity supported;Feet supported Sitting balance-Leahy Scale: Good     Standing balance support: Bilateral upper extremity supported;During functional activity Standing balance-Leahy Scale: Fair                      Cognition Arousal/Alertness: Awake/alert Behavior During Therapy: WFL for tasks assessed/performed Overall Cognitive Status: Difficult to assess                      Exercises Total Joint Exercises Knee Flexion: AROM;10 reps;Left;Standing Marching in Standing: AROM;Both;10 reps;Standing General Exercises - Lower Extremity Mini-Sqauts: Strengthening;Both;Standing;10 reps    General Comments        Pertinent Vitals/Pain Pain Assessment: No/denies pain Pain Intervention(s): Monitored during session;Limited activity within patient's tolerance;Repositioned    Home Living                      Prior Function            PT Goals (current goals can now be found in the care plan section) Acute Rehab PT Goals Patient Stated Goal: rehab then home PT Goal Formulation: With patient Time For Goal Achievement: 09/20/14 Potential to Achieve Goals: Good Progress towards PT goals: Progressing toward goals    Frequency  7X/week    PT Plan Current plan remains appropriate  Co-evaluation             End of Session Equipment Utilized During Treatment: Gait belt Activity Tolerance: Patient limited by fatigue;Patient tolerated treatment well Patient left: in chair;with call bell/phone within reach     Time: 1025-1043 PT Time Calculation (min) (ACUTE ONLY): 18 min  Charges:   $Therapeutic Exercise: 8-22 mins                    G Codes:      Joslyn Hy PT, [place] [phone] Pager: [phone] 09/18/2014, 1:42 PM

## 2014-09-18 NOTE — Discharge Summary (Signed)
Physician Discharge Summary      Patient ID: Frank Barron MRN: [id number] DOB/AGE: 06/29/1950 64 y.o.  Admit date: 09/12/2014 Discharge date: 09/19/14  Admission Diagnoses:  Left knee osteoarthritis  Discharge Diagnoses:  Active Problems:   S/P total knee replacement using cement   Past Medical History  Diagnosis Date  . Hypertension   . Anxiety   . CHF (congestive heart failure)   . Anemia   . High cholesterol   . History of DVT of lower extremity     "left; long time ago" (09/30/2012)  . Depression     [name] 07/19/2005 (09/30/2012)  . Hepatitis C     pt denies this hx on 09/30/2012  . CAD (coronary artery disease)     PCI to LAD 2012  . Renal cell carcinoma   . Atrial fibrillation   . Stroke 11/18/2011    [name] 07/31/2012; expressive aphasia noted on 09/30/2012    Surgeries: Procedure(s): LEFT TOTAL KNEE ARTHROPLASTY on 09/12/2014   Consultants (if any):    Discharged Condition: Improved  Hospital Course: Frank Barron is an 64 y.o. male who was admitted 09/12/2014 with a diagnosis of left knee osteoarthritis and went to the operating room on 09/12/2014 and underwent the above named procedures.    He was given perioperative antibiotics:      Anti-infectives    Start     Dose/Rate Route Frequency Ordered Stop   09/12/14 2145  ceFAZolin (ANCEF) IVPB 2 g/50 mL premix     2 g 100 mL/hr over 30 Minutes Intravenous Every 6 hours 09/12/14 2144 09/13/14 0447   09/12/14 1130  ceFAZolin (ANCEF) IVPB 2 g/50 mL premix     2 g 100 mL/hr over 30 Minutes Intravenous To ShortStay Surgical 09/11/14 1259 09/12/14 1435    .  He was given sequential compression devices, early ambulation, and xarelto for DVT prophylaxis.  He benefited maximally from the hospital stay and there were no complications.    Recent vital signs:  Filed Vitals:   09/18/14 1300  BP: 109/72  Pulse: 64  Temp: 98.2 F (36.8 C)  Resp: 16    Recent laboratory studies:  Lab Results  Component  Value Date   HGB 8.1* 09/16/2014   HGB 8.4* 09/15/2014   HGB 9.6* 09/14/2014   Lab Results  Component Value Date   WBC 5.5 09/16/2014   PLT 150 09/16/2014   Lab Results  Component Value Date   INR 1.23 09/12/2014   Lab Results  Component Value Date   NA 136 09/13/2014   K 4.4 09/13/2014   CL 103 09/13/2014   CO2 24 09/13/2014   BUN 21* 09/13/2014   CREATININE 1.73* 09/13/2014   GLUCOSE 127* 09/13/2014    Discharge Medications:     Medication List    TAKE these medications        amLODipine 10 MG tablet  Commonly known as:  NORVASC  Take 5 mg by mouth daily.     aspirin 81 MG chewable tablet  Chew 81 mg by mouth daily.     citalopram 20 MG tablet  Commonly known as:  CELEXA  Take 10 mg by mouth daily.     cyclobenzaprine 10 MG tablet  Commonly known as:  FLEXERIL  Take 5 mg by mouth 3 (three) times daily as needed for muscle spasms.     ferrous sulfate 325 (65 FE) MG tablet  Take 325 mg by mouth 2 (two) times daily with a meal.  folic acid 1 MG tablet  Commonly known as:  FOLVITE  Take 1 tablet (1 mg total) by mouth daily.     hydrALAZINE 25 MG tablet  Commonly known as:  APRESOLINE  Take 1 tablet (25 mg total) by mouth 2 (two) times daily.     metoprolol tartrate 25 MG tablet  Commonly known as:  LOPRESSOR  Take 12.5 mg by mouth 2 (two) times daily.     OxyCODONE 10 mg T12a 12 hr tablet  Commonly known as:  OXYCONTIN  Take 1 tablet (10 mg total) by mouth every 12 (twelve) hours.     oxyCODONE-acetaminophen 5-325 MG per tablet  Commonly known as:  PERCOCET  Take 1-2 tablets by mouth every 4 (four) hours as needed for severe pain.     pantoprazole 40 MG tablet  Commonly known as:  PROTONIX  Take 1 tablet (40 mg total) by mouth daily at 12 noon.     sildenafil 100 MG tablet  Commonly known as:  VIAGRA  Take 50 mg by mouth daily as needed for erectile dysfunction. Do not exceed 1 tablet in 24 hours     simvastatin 40 MG tablet  Commonly  known as:  ZOCOR  Take 20 mg by mouth at bedtime.     thiamine 100 MG tablet  Commonly known as:  VITAMIN B-1  Take 1 tablet (100 mg total) by mouth daily.     XARELTO 20 MG Tabs tablet  Generic drug:  rivaroxaban  Take 20 mg by mouth daily with breakfast.        Diagnostic Studies: Dg Knee Left Port  09/12/2014   CLINICAL DATA:  Status post knee replacement  EXAM: PORTABLE LEFT KNEE - 1-2 VIEW  COMPARISON:  None.  FINDINGS: A left knee prosthesis is now seen. No loosening or acute bony abnormality is noted. Air is noted within the surgical bed.  IMPRESSION: Status post knee replacement without acute abnormality.   Electronically Signed   By: Inez Catalina M.D.   On: 09/12/2014 17:57    Disposition: 01-Home or Self Care  Discharge Instructions    Call MD / Call 911    Complete by:  As directed   If you experience chest pain or shortness of breath, CALL 911 and be transported to the hospital emergency room.  If you develope a fever above 101.5 F, pus (white drainage) or increased drainage or redness at the wound, or calf pain, call your surgeon's office.     Call MD / Call 911    Complete by:  As directed   If you experience chest pain or shortness of breath, CALL 911 and be transported to the hospital emergency room.  If you develope a fever above 101.5 F, pus (white drainage) or increased drainage or redness at the wound, or calf pain, call your surgeon's office.     Call MD / Call 911    Complete by:  As directed   If you experience chest pain or shortness of breath, CALL 911 and be transported to the hospital emergency room.  If you develope a fever above 101.5 F, pus (white drainage) or increased drainage or redness at the wound, or calf pain, call your surgeon's office.     Constipation Prevention    Complete by:  As directed   Drink plenty of fluids.  Prune juice may be helpful.  You may use a stool softener, such as Colace (over the counter) 100 mg twice a day.  Use MiraLax (over  the counter) for constipation as needed.     Constipation Prevention    Complete by:  As directed   Drink plenty of fluids.  Prune juice may be helpful.  You may use a stool softener, such as Colace (over the counter) 100 mg twice a day.  Use MiraLax (over the counter) for constipation as needed.     Constipation Prevention    Complete by:  As directed   Drink plenty of fluids.  Prune juice may be helpful.  You may use a stool softener, such as Colace (over the counter) 100 mg twice a day.  Use MiraLax (over the counter) for constipation as needed.     Diet - low sodium heart healthy    Complete by:  As directed      Diet - low sodium heart healthy    Complete by:  As directed      Diet - low sodium heart healthy    Complete by:  As directed      Diet general    Complete by:  As directed      Diet general    Complete by:  As directed      Diet general    Complete by:  As directed      Driving restrictions    Complete by:  As directed   No driving while taking narcotic pain meds.     Driving restrictions    Complete by:  As directed   No driving while taking narcotic pain meds.     Driving restrictions    Complete by:  As directed   No driving while taking narcotic pain meds.     Increase activity slowly as tolerated    Complete by:  As directed      Increase activity slowly as tolerated    Complete by:  As directed      Increase activity slowly as tolerated    Complete by:  As directed            Follow-up Information    Follow up with Marianna Payment, MD In 2 weeks.   Specialty:  Orthopedic Surgery   Why:  For suture removal, For wound re-check   Contact information:   [address] [place] [address] [phone]        Signed: Marianna Payment 09/18/2014, 4:37 PM

## 2014-09-19 NOTE — Discharge Planning (Signed)
CSW confirmed with [facility] admissions liaison regarding patient's admission on 09/19/2014. [place] admissions liaison also confirmed transportation to be provided by [facility] between 8am-9am.  Discharge packet complete and placed on patient's chart. RN reports to [place] report has been called 7/7 am.  Facility: [facility] RN report: [phone] Transportation: [facility] providing EMS transport.  Lubertha Sayres, [place] Clinical Social Work Department Orthopedics [phone]) and Surgical [phone])

## 2015-09-12 ENCOUNTER — Other Ambulatory Visit (HOSPITAL_COMMUNITY): Payer: Self-pay | Admitting: Urology

## 2015-09-12 ENCOUNTER — Inpatient Hospital Stay
Admission: RE | Admit: 2015-09-12 | Discharge: 2015-09-12 | Disposition: A | Discharge status: Home / Self Care | Payer: Self-pay | Source: Ambulatory Visit | Attending: Urology | Admitting: Urology

## 2015-09-12 DIAGNOSIS — R52 Pain, unspecified: Secondary | ICD-10-CM

## 2015-09-12 DIAGNOSIS — C649 Malignant neoplasm of unspecified kidney, except renal pelvis: Secondary | ICD-10-CM

## 2015-09-17 ENCOUNTER — Other Ambulatory Visit (HOSPITAL_COMMUNITY): Payer: Self-pay | Admitting: Urology

## 2015-09-17 DIAGNOSIS — C649 Malignant neoplasm of unspecified kidney, except renal pelvis: Secondary | ICD-10-CM

## 2015-10-03 ENCOUNTER — Other Ambulatory Visit: Payer: Self-pay | Admitting: Radiology

## 2015-10-06 ENCOUNTER — Encounter (HOSPITAL_COMMUNITY): Payer: Self-pay

## 2015-10-06 ENCOUNTER — Ambulatory Visit (HOSPITAL_COMMUNITY)
Admission: RE | Admit: 2015-10-06 | Discharge: 2015-10-06 | Disposition: A | Discharge status: Home / Self Care | Payer: Medicare PPO | Source: Ambulatory Visit | Attending: Urology | Admitting: Urology

## 2015-10-06 DIAGNOSIS — Z8673 Personal history of transient ischemic attack (TIA), and cerebral infarction without residual deficits: Secondary | ICD-10-CM | POA: Insufficient documentation

## 2015-10-06 DIAGNOSIS — E279 Disorder of adrenal gland, unspecified: Secondary | ICD-10-CM | POA: Diagnosis not present

## 2015-10-06 DIAGNOSIS — F329 Major depressive disorder, single episode, unspecified: Secondary | ICD-10-CM | POA: Diagnosis not present

## 2015-10-06 DIAGNOSIS — C649 Malignant neoplasm of unspecified kidney, except renal pelvis: Secondary | ICD-10-CM

## 2015-10-06 DIAGNOSIS — I10 Essential (primary) hypertension: Secondary | ICD-10-CM | POA: Diagnosis not present

## 2015-10-06 DIAGNOSIS — F419 Anxiety disorder, unspecified: Secondary | ICD-10-CM | POA: Insufficient documentation

## 2015-10-06 DIAGNOSIS — I4891 Unspecified atrial fibrillation: Secondary | ICD-10-CM | POA: Diagnosis not present

## 2015-10-06 DIAGNOSIS — I2510 Atherosclerotic heart disease of native coronary artery without angina pectoris: Secondary | ICD-10-CM | POA: Diagnosis not present

## 2015-10-06 DIAGNOSIS — Z8249 Family history of ischemic heart disease and other diseases of the circulatory system: Secondary | ICD-10-CM | POA: Diagnosis not present

## 2015-10-06 DIAGNOSIS — Z905 Acquired absence of kidney: Secondary | ICD-10-CM | POA: Diagnosis not present

## 2015-10-06 DIAGNOSIS — Z79899 Other long term (current) drug therapy: Secondary | ICD-10-CM | POA: Diagnosis not present

## 2015-10-06 DIAGNOSIS — Z86718 Personal history of other venous thrombosis and embolism: Secondary | ICD-10-CM | POA: Diagnosis not present

## 2015-10-06 DIAGNOSIS — E7800 Pure hypercholesterolemia, unspecified: Secondary | ICD-10-CM | POA: Insufficient documentation

## 2015-10-06 DIAGNOSIS — Z85528 Personal history of other malignant neoplasm of kidney: Secondary | ICD-10-CM | POA: Insufficient documentation

## 2015-10-06 DIAGNOSIS — Z7982 Long term (current) use of aspirin: Secondary | ICD-10-CM | POA: Insufficient documentation

## 2015-10-06 LAB — CBC
HEMATOCRIT: 31.4 % — AB (ref 39.0–52.0)
Hemoglobin: 9.8 g/dL — ABNORMAL LOW (ref 13.0–17.0)
MCH: 24.6 pg — AB (ref 26.0–34.0)
MCHC: 31.2 g/dL (ref 30.0–36.0)
MCV: 78.9 fL (ref 78.0–100.0)
Platelets: 233 10*3/uL (ref 150–400)
RBC: 3.98 MIL/uL — ABNORMAL LOW (ref 4.22–5.81)
RDW: 14 % (ref 11.5–15.5)
WBC: 3.7 10*3/uL — ABNORMAL LOW (ref 4.0–10.5)

## 2015-10-06 LAB — PROTIME-INR
INR: 1.19 (ref 0.00–1.49)
Prothrombin Time: 15.3 seconds — ABNORMAL HIGH (ref 11.6–15.2)

## 2015-10-06 LAB — APTT: aPTT: 27 seconds (ref 24–37)

## 2015-10-06 LAB — GLUCOSE, CAPILLARY: Glucose-Capillary: 101 mg/dL — ABNORMAL HIGH (ref 65–99)

## 2015-10-06 MED ORDER — MIDAZOLAM HCL 2 MG/2ML IJ SOLN
INTRAMUSCULAR | Status: AC
  Filled 2015-10-06: qty 4

## 2015-10-06 MED ORDER — HYDROCODONE-ACETAMINOPHEN 5-325 MG PO TABS
1.0000 | ORAL_TABLET | ORAL | Status: DC | PRN
  Filled 2015-10-06: qty 2

## 2015-10-06 MED ORDER — LIDOCAINE HCL 1 % IJ SOLN
INTRAMUSCULAR | Status: AC
  Filled 2015-10-06: qty 20

## 2015-10-06 MED ORDER — FENTANYL CITRATE (PF) 100 MCG/2ML IJ SOLN
INTRAMUSCULAR | Status: AC
  Filled 2015-10-06: qty 4

## 2015-10-06 MED ORDER — FENTANYL CITRATE (PF) 100 MCG/2ML IJ SOLN
INTRAMUSCULAR | Status: AC | PRN
  Administered 2015-10-06 (×2): 25 ug via INTRAVENOUS
  Administered 2015-10-06: 50 ug via INTRAVENOUS

## 2015-10-06 MED ORDER — SODIUM CHLORIDE 0.9 % IV SOLN
INTRAVENOUS | Status: AC | PRN
  Administered 2015-10-06: 10 mL/h via INTRAVENOUS

## 2015-10-06 MED ORDER — MIDAZOLAM HCL 2 MG/2ML IJ SOLN
INTRAMUSCULAR | Status: AC | PRN
  Administered 2015-10-06 (×2): 0.5 mg via INTRAVENOUS
  Administered 2015-10-06: 1 mg via INTRAVENOUS

## 2015-10-06 MED ORDER — SODIUM CHLORIDE 0.9 % IV SOLN: Freq: Once | INTRAVENOUS | Status: DC

## 2015-10-06 NOTE — Discharge Instructions (Signed)
Needle Biopsy, Care After °These instructions give you information about caring for yourself after your procedure. Your doctor may also give you more specific instructions. Call your doctor if you have any problems or questions after your procedure. °HOME CARE °· Rest as told by your doctor. °· Take medicines only as told by your doctor. °· There are many different ways to close and cover the biopsy site, including stitches (sutures), skin glue, and adhesive strips. Follow instructions from your doctor about: °¨ How to take care of your biopsy site. °¨ When and how you should change your bandage (dressing). °¨ When you should remove your dressing. °¨ Removing whatever was used to close your biopsy site. °· Check your biopsy site every day for signs of infection. Watch for: °¨ Redness, swelling, or pain. °¨ Fluid, blood, or pus. °GET HELP IF: °· You have a fever. °· You have redness, swelling, or pain at the biopsy site, and it lasts longer than a few days. °· You have fluid, blood, or pus coming from the biopsy site. °· You feel sick to your stomach (nauseous). °· You throw up (vomit). °GET HELP RIGHT AWAY IF: °· You are short of breath. °· You have trouble breathing. °· Your chest hurts. °· You feel dizzy or you pass out (faint). °· You have bleeding that does not stop with pressure or a bandage. °· You cough up blood. °· Your belly (abdomen) hurts. °  °This information is not intended to replace advice given to you by your health care provider. Make sure you discuss any questions you have with your health care provider. °  °Document Released: 02/12/2008 Document Revised: 07/16/2014 Document Reviewed: 02/25/2014 °Elsevier Interactive Patient Education ©2016 [employer] ° °

## 2015-10-06 NOTE — Procedures (Signed)
CT guided biopsy of right adrenal lesion.  2 cores performed but no tissue collected.  2 ml of blood fluid collected from coaxial needle.  No immediate complication.  Minimal blood loss.

## 2015-10-06 NOTE — H&P (Signed)
Chief Complaint: right adrenal mass  Referring Physician:Dr. Tedd Sias  Supervising Physician: Markus Daft  Patient Status: Out-pt  HPI: Frank Barron is an 65 y.o. male who is not a good historian who doesn't know why he had a recent CT with findings of a right adrenal mass.  The patient denies any abdominal pain.  He does have a history of a right nephrectomy for renal cell carcinoma.  He has no other complaints.  He presents today for this biopsy  Past Medical History:  Past Medical History:  Diagnosis Date  . Anemia   . Anxiety   . Atrial fibrillation ([place])   . CAD (coronary artery disease)    PCI to LAD 2012  . CHF (congestive heart failure) ([place])   . Depression    [name] 07/19/2005 (09/30/2012)  . Hepatitis C    pt denies this hx on 09/30/2012  . High cholesterol   . History of DVT of lower extremity    "left; long time ago" (09/30/2012)  . Hypertension   . Renal cell carcinoma ([place])   . Stroke [facility]) 11/18/2011   [name] 07/31/2012; expressive aphasia noted on 09/30/2012    Past Surgical History:  Past Surgical History:  Procedure Laterality Date  . COLONOSCOPY N/A 10/01/2012   Procedure: COLONOSCOPY;  Surgeon: Inda Castle, MD;  Location: [place];  Service: Endoscopy;  Laterality: N/A;  . CORONARY ANGIOPLASTY WITH STENT PLACEMENT    . ESOPHAGOGASTRODUODENOSCOPY N/A 10/01/2012   Procedure: ESOPHAGOGASTRODUODENOSCOPY (EGD);  Surgeon: Inda Castle, MD;  Location: [place];  Service: Endoscopy;  Laterality: N/A;  . EYE SURGERY Right 1975   /notes 07/19/2005; "injured playing football" (09/29/2012)  . GIVENS CAPSULE STUDY N/A 10/02/2012   Procedure: GIVENS CAPSULE STUDY;  Surgeon: Inda Castle, MD;  Location: [place];  Service: Endoscopy;  Laterality: N/A;  . JOINT REPLACEMENT  01/03/2007  . LUMBAR DISC SURGERY  2001   herniated disc/notes 07/19/2005 (09/30/2012)  . NEPHRECTOMY  12/26/2013   Laparascopic , [facility]  . TOTAL HIP ARTHROPLASTY Left  01/03/2007   [name] 01/03/2007 (09/30/2012)  . TOTAL KNEE ARTHROPLASTY Left 09/12/2014   Procedure: LEFT TOTAL KNEE ARTHROPLASTY;  Surgeon: Leandrew Koyanagi, MD;  Location: [place];  Service: Orthopedics;  Laterality: Left;    Family History:  Family History  Problem Relation Age of Onset  . Hypertension Mother   . Deep vein thrombosis Mother     Social History:  reports that he has never smoked. He has never used smokeless tobacco. He reports that he drinks alcohol. He reports that he does not use drugs.  Allergies: No Known Allergies  Medications:   Medication List    ASK your doctor about these medications   amLODipine 10 MG tablet Commonly known as:  NORVASC Take 5 mg by mouth daily.   aspirin 81 MG chewable tablet Chew 81 mg by mouth daily.   citalopram 20 MG tablet Commonly known as:  CELEXA Take 10 mg by mouth daily.   cyclobenzaprine 10 MG tablet Commonly known as:  FLEXERIL Take 5 mg by mouth 3 (three) times daily as needed for muscle spasms.   ferrous sulfate 325 (65 FE) MG tablet Take 325 mg by mouth 2 (two) times daily with a meal.   hydrALAZINE 25 MG tablet Commonly known as:  APRESOLINE Take 1 tablet (25 mg total) by mouth 2 (two) times daily.   metoprolol tartrate 25 MG tablet Commonly known as:  LOPRESSOR Take 12.5 mg by  mouth 2 (two) times daily.   sildenafil 100 MG tablet Commonly known as:  VIAGRA Take 50 mg by mouth daily as needed for erectile dysfunction. Do not exceed 1 tablet in 24 hours   simvastatin 40 MG tablet Commonly known as:  ZOCOR Take 20 mg by mouth at bedtime.       Please HPI for pertinent positives, otherwise complete 10 system ROS negative.  Mallampati Score: MD Evaluation Airway: WNL Heart: WNL Abdomen: WNL Chest/ Lungs: WNL ASA  Classification: 3 Mallampati/Airway Score: Two  Physical Exam: BP (!) 141/95   Pulse (!) 55   Temp 98.1 F (36.7 C) (Oral)   Resp 18   Ht 6\' 3"  (1.905 m)   Wt 219 lb (99.3 kg)   SpO2  100%   BMI 27.37 kg/m  Body mass index is 27.37 kg/m. General: pleasant, WD, WN black male who is laying in bed in NAD HEENT: head is normocephalic, atraumatic.  Sclera are noninjected.  PERRL.  Disconjugate gaze.  Ears and nose without any masses or lesions.  Mouth is pink and moist Heart: regular, rate, and rhythm.  Normal s1,s2. No obvious murmurs, gallops, or rubs noted.  Palpable radial and pedal pulses bilaterally Lungs: CTAB, no wheezes, rhonchi, or rales noted.  Respiratory effort nonlabored Abd: soft, NT, ND, +BS, no masses, hernias, or organomegaly.  Lipoma noted on right mid back. MS: all 4 extremities are symmetrical with no cyanosis, clubbing, or edema. Psych: A&Ox3 with an appropriate affect.   Labs: Results for orders placed or performed during the hospital encounter of 10/06/15 (from the past 48 hour(s))  APTT upon arrival     Status: None   Collection Time: 10/06/15  6:30 AM  Result Value Ref Range   aPTT 27 24 - 37 seconds  CBC upon arrival     Status: Abnormal   Collection Time: 10/06/15  6:30 AM  Result Value Ref Range   WBC 3.7 (L) 4.0 - 10.5 K/uL   RBC 3.98 (L) 4.22 - 5.81 MIL/uL   Hemoglobin 9.8 (L) 13.0 - 17.0 g/dL   HCT 31.4 (L) 39.0 - 52.0 %   MCV 78.9 78.0 - 100.0 fL   MCH 24.6 (L) 26.0 - 34.0 pg   MCHC 31.2 30.0 - 36.0 g/dL   RDW 14.0 11.5 - 15.5 %   Platelets 233 150 - 400 K/uL  Protime-INR upon arrival     Status: Abnormal   Collection Time: 10/06/15  6:30 AM  Result Value Ref Range   Prothrombin Time 15.3 (H) 11.6 - 15.2 seconds   INR 1.19 0.00 - 1.49  Glucose, capillary     Status: Abnormal   Collection Time: 10/06/15  6:34 AM  Result Value Ref Range   Glucose-Capillary 101 (H) 65 - 99 mg/dL    Imaging: No results found.  Assessment/Plan 1. Right Adrenal mass -we will plan to proceed with biopsy of his right adrenal mass today -his labs and vitals have been reviewed -Risks and Benefits discussed with the patient including, but not  limited to bleeding, infection, damage to adjacent structures or low yield requiring additional tests. All of the patient's questions were answered, patient is agreeable to proceed. Consent signed and in chart.   Thank you for this interesting consult.  I greatly enjoyed meeting Frank Barron and look forward to participating in their care.  A copy of this report was sent to the requesting provider on this date.  Electronically Signed: Henreitta Cea 10/06/2015, 7:54 AM  I spent a total of  30 Minutes   in face to face in clinical consultation, greater than 50% of which was counseling/coordinating care for adrenal mass, needs biopsy

## 2016-01-01 ENCOUNTER — Other Ambulatory Visit: Payer: Self-pay

## 2016-01-01 ENCOUNTER — Emergency Department (HOSPITAL_COMMUNITY): Payer: Medicare PPO

## 2016-01-01 ENCOUNTER — Encounter (HOSPITAL_COMMUNITY): Payer: Self-pay | Admitting: Emergency Medicine

## 2016-01-01 ENCOUNTER — Inpatient Hospital Stay (HOSPITAL_COMMUNITY)
Admission: EM | Admit: 2016-01-01 | Discharge: 2016-01-03 | DRG: 872 | Disposition: A | Discharge status: Home / Self Care | Payer: Medicare PPO | Attending: Family Medicine | Admitting: Family Medicine

## 2016-01-01 DIAGNOSIS — R6520 Severe sepsis without septic shock: Secondary | ICD-10-CM | POA: Diagnosis present

## 2016-01-01 DIAGNOSIS — I4891 Unspecified atrial fibrillation: Secondary | ICD-10-CM | POA: Diagnosis not present

## 2016-01-01 DIAGNOSIS — E278 Other specified disorders of adrenal gland: Secondary | ICD-10-CM | POA: Diagnosis present

## 2016-01-01 DIAGNOSIS — E876 Hypokalemia: Secondary | ICD-10-CM | POA: Diagnosis not present

## 2016-01-01 DIAGNOSIS — E860 Dehydration: Secondary | ICD-10-CM | POA: Diagnosis not present

## 2016-01-01 DIAGNOSIS — R911 Solitary pulmonary nodule: Secondary | ICD-10-CM | POA: Diagnosis present

## 2016-01-01 DIAGNOSIS — Z905 Acquired absence of kidney: Secondary | ICD-10-CM

## 2016-01-01 DIAGNOSIS — N390 Urinary tract infection, site not specified: Secondary | ICD-10-CM | POA: Diagnosis not present

## 2016-01-01 DIAGNOSIS — E279 Disorder of adrenal gland, unspecified: Secondary | ICD-10-CM | POA: Diagnosis not present

## 2016-01-01 DIAGNOSIS — N12 Tubulo-interstitial nephritis, not specified as acute or chronic: Secondary | ICD-10-CM

## 2016-01-01 DIAGNOSIS — R17 Unspecified jaundice: Secondary | ICD-10-CM

## 2016-01-01 DIAGNOSIS — I69320 Aphasia following cerebral infarction: Secondary | ICD-10-CM

## 2016-01-01 DIAGNOSIS — N179 Acute kidney failure, unspecified: Secondary | ICD-10-CM | POA: Diagnosis not present

## 2016-01-01 DIAGNOSIS — I119 Hypertensive heart disease without heart failure: Secondary | ICD-10-CM | POA: Diagnosis present

## 2016-01-01 DIAGNOSIS — D6959 Other secondary thrombocytopenia: Secondary | ICD-10-CM | POA: Diagnosis present

## 2016-01-01 DIAGNOSIS — D696 Thrombocytopenia, unspecified: Secondary | ICD-10-CM

## 2016-01-01 DIAGNOSIS — N289 Disorder of kidney and ureter, unspecified: Secondary | ICD-10-CM

## 2016-01-01 DIAGNOSIS — R109 Unspecified abdominal pain: Secondary | ICD-10-CM | POA: Diagnosis not present

## 2016-01-01 DIAGNOSIS — A419 Sepsis, unspecified organism: Principal | ICD-10-CM

## 2016-01-01 DIAGNOSIS — N189 Chronic kidney disease, unspecified: Secondary | ICD-10-CM | POA: Diagnosis present

## 2016-01-01 DIAGNOSIS — I10 Essential (primary) hypertension: Secondary | ICD-10-CM | POA: Diagnosis present

## 2016-01-01 DIAGNOSIS — R509 Fever, unspecified: Secondary | ICD-10-CM | POA: Diagnosis not present

## 2016-01-01 DIAGNOSIS — R130 Aphagia: Secondary | ICD-10-CM | POA: Diagnosis present

## 2016-01-01 LAB — CBC
HCT: 38 % — ABNORMAL LOW (ref 39.0–52.0)
HEMOGLOBIN: 13.1 g/dL (ref 13.0–17.0)
MCH: 29 pg (ref 26.0–34.0)
MCHC: 34.5 g/dL (ref 30.0–36.0)
MCV: 84.1 fL (ref 78.0–100.0)
Platelets: 115 10*3/uL — ABNORMAL LOW (ref 150–400)
RBC: 4.52 MIL/uL (ref 4.22–5.81)
RDW: 19.4 % — ABNORMAL HIGH (ref 11.5–15.5)
WBC: 8.7 10*3/uL (ref 4.0–10.5)

## 2016-01-01 LAB — COMPREHENSIVE METABOLIC PANEL
ALBUMIN: 3.2 g/dL — AB (ref 3.5–5.0)
ALT: 21 U/L (ref 17–63)
ANION GAP: 11 (ref 5–15)
AST: 30 U/L (ref 15–41)
Alkaline Phosphatase: 59 U/L (ref 38–126)
BUN: 17 mg/dL (ref 6–20)
CHLORIDE: 103 mmol/L (ref 101–111)
CO2: 20 mmol/L — AB (ref 22–32)
Calcium: 9.1 mg/dL (ref 8.9–10.3)
Creatinine, Ser: 2.01 mg/dL — ABNORMAL HIGH (ref 0.61–1.24)
GFR calc Af Amer: 39 mL/min — ABNORMAL LOW (ref >60)
GFR calc non Af Amer: 33 mL/min — ABNORMAL LOW (ref >60)
GLUCOSE: 107 mg/dL — AB (ref 65–99)
POTASSIUM: 3.4 mmol/L — AB (ref 3.5–5.1)
SODIUM: 134 mmol/L — AB (ref 135–145)
TOTAL PROTEIN: 6.9 g/dL (ref 6.5–8.1)
Total Bilirubin: 2.1 mg/dL — ABNORMAL HIGH (ref 0.3–1.2)

## 2016-01-01 LAB — I-STAT CG4 LACTIC ACID, ED: Lactic Acid, Venous: 1.62 mmol/L (ref 0.5–1.9)

## 2016-01-01 LAB — URINALYSIS, ROUTINE W REFLEX MICROSCOPIC
Bilirubin Urine: NEGATIVE
Glucose, UA: NEGATIVE mg/dL
Ketones, ur: NEGATIVE mg/dL
Nitrite: POSITIVE — AB
Protein, ur: 30 mg/dL — AB
Specific Gravity, Urine: 1.009 (ref 1.005–1.030)
pH: 5.5 (ref 5.0–8.0)

## 2016-01-01 LAB — URINE MICROSCOPIC-ADD ON

## 2016-01-01 LAB — APTT: APTT: 34 s (ref 24–36)

## 2016-01-01 LAB — PROTIME-INR
INR: 1.44
Prothrombin Time: 17.7 seconds — ABNORMAL HIGH (ref 11.4–15.2)

## 2016-01-01 LAB — PROCALCITONIN: PROCALCITONIN: 7.75 ng/mL

## 2016-01-01 LAB — LIPASE, BLOOD: Lipase: 24 U/L (ref 11–51)

## 2016-01-01 LAB — LACTIC ACID, PLASMA: LACTIC ACID, VENOUS: 1.4 mmol/L (ref 0.5–1.9)

## 2016-01-01 MED ORDER — ASPIRIN 81 MG PO CHEW
81.0000 mg | CHEWABLE_TABLET | Freq: Every day | ORAL | Status: DC
  Administered 2016-01-02 – 2016-01-03 (×2): 81 mg via ORAL
  Filled 2016-01-01 (×2): qty 1

## 2016-01-01 MED ORDER — SENNA 8.6 MG PO TABS: 1.0000 | ORAL_TABLET | Freq: Every day | ORAL | Status: DC | PRN

## 2016-01-01 MED ORDER — PIPERACILLIN-TAZOBACTAM 3.375 G IVPB
3.3750 g | Freq: Three times a day (TID) | INTRAVENOUS | Status: DC
  Filled 2016-01-01 (×2): qty 50

## 2016-01-01 MED ORDER — DEXTROSE 5 % IV SOLN
2.0000 g | INTRAVENOUS | Status: DC
  Filled 2016-01-01: qty 2

## 2016-01-01 MED ORDER — METOPROLOL TARTRATE 12.5 MG HALF TABLET
12.5000 mg | ORAL_TABLET | Freq: Two times a day (BID) | ORAL | Status: DC
  Administered 2016-01-01 – 2016-01-03 (×4): 12.5 mg via ORAL
  Filled 2016-01-01 (×4): qty 1

## 2016-01-01 MED ORDER — DEXTROSE 5 % IV SOLN
2.0000 g | Freq: Once | INTRAVENOUS | Status: AC
  Administered 2016-01-01: 2 g via INTRAVENOUS
  Filled 2016-01-01: qty 2

## 2016-01-01 MED ORDER — ACETAMINOPHEN 650 MG RE SUPP: 650.0000 mg | Freq: Four times a day (QID) | RECTAL | Status: DC | PRN

## 2016-01-01 MED ORDER — VANCOMYCIN HCL IN DEXTROSE 1-5 GM/200ML-% IV SOLN
1000.0000 mg | Freq: Once | INTRAVENOUS | Status: DC
  Filled 2016-01-01: qty 200

## 2016-01-01 MED ORDER — LACTATED RINGERS IV SOLN
INTRAVENOUS | Status: DC
  Administered 2016-01-01 – 2016-01-03 (×4): via INTRAVENOUS

## 2016-01-01 MED ORDER — ACETAMINOPHEN 500 MG PO TABS
1000.0000 mg | ORAL_TABLET | Freq: Once | ORAL | Status: AC
  Administered 2016-01-01: 1000 mg via ORAL
  Filled 2016-01-01: qty 2

## 2016-01-01 MED ORDER — AMLODIPINE BESYLATE 5 MG PO TABS
5.0000 mg | ORAL_TABLET | Freq: Every day | ORAL | Status: DC
  Administered 2016-01-01 – 2016-01-03 (×3): 5 mg via ORAL
  Filled 2016-01-01 (×3): qty 1

## 2016-01-01 MED ORDER — ONDANSETRON HCL 4 MG/2ML IJ SOLN: 4.0000 mg | Freq: Four times a day (QID) | INTRAMUSCULAR | Status: DC | PRN

## 2016-01-01 MED ORDER — ACETAMINOPHEN 325 MG PO TABS: 650.0000 mg | ORAL_TABLET | Freq: Four times a day (QID) | ORAL | Status: DC | PRN

## 2016-01-01 MED ORDER — GABAPENTIN 300 MG PO CAPS
300.0000 mg | ORAL_CAPSULE | Freq: Two times a day (BID) | ORAL | Status: DC
  Administered 2016-01-01 – 2016-01-03 (×4): 300 mg via ORAL
  Filled 2016-01-01 (×4): qty 1

## 2016-01-01 MED ORDER — ONDANSETRON HCL 4 MG PO TABS: 4.0000 mg | ORAL_TABLET | Freq: Four times a day (QID) | ORAL | Status: DC | PRN

## 2016-01-01 MED ORDER — RIVAROXABAN 15 MG PO TABS
15.0000 mg | ORAL_TABLET | Freq: Every day | ORAL | Status: DC
  Administered 2016-01-02 – 2016-01-03 (×2): 15 mg via ORAL
  Filled 2016-01-01 (×2): qty 1

## 2016-01-01 MED ORDER — TAMSULOSIN HCL 0.4 MG PO CAPS
0.4000 mg | ORAL_CAPSULE | Freq: Every day | ORAL | Status: DC
  Administered 2016-01-01 – 2016-01-03 (×3): 0.4 mg via ORAL
  Filled 2016-01-01 (×3): qty 1

## 2016-01-01 MED ORDER — PIPERACILLIN-TAZOBACTAM 3.375 G IVPB 30 MIN
3.3750 g | Freq: Once | INTRAVENOUS | Status: AC
  Administered 2016-01-01: 3.375 g via INTRAVENOUS
  Filled 2016-01-01: qty 50

## 2016-01-01 MED ORDER — POTASSIUM CHLORIDE CRYS ER 20 MEQ PO TBCR
40.0000 meq | EXTENDED_RELEASE_TABLET | Freq: Once | ORAL | Status: AC
  Administered 2016-01-01: 40 meq via ORAL
  Filled 2016-01-01: qty 2

## 2016-01-01 MED ORDER — HYDRALAZINE HCL 25 MG PO TABS
25.0000 mg | ORAL_TABLET | Freq: Two times a day (BID) | ORAL | Status: DC
  Administered 2016-01-01 – 2016-01-02 (×3): 25 mg via ORAL
  Filled 2016-01-01 (×4): qty 1

## 2016-01-01 MED ORDER — FERROUS SULFATE 325 (65 FE) MG PO TABS
325.0000 mg | ORAL_TABLET | Freq: Two times a day (BID) | ORAL | Status: DC
  Administered 2016-01-02 – 2016-01-03 (×3): 325 mg via ORAL
  Filled 2016-01-01 (×3): qty 1

## 2016-01-01 MED ORDER — SODIUM CHLORIDE 0.9 % IV SOLN: 1250.0000 mg | INTRAVENOUS | Status: DC

## 2016-01-01 MED ORDER — SODIUM CHLORIDE 0.9 % IV SOLN
2000.0000 mg | Freq: Once | INTRAVENOUS | Status: AC
  Administered 2016-01-01: 2000 mg via INTRAVENOUS
  Filled 2016-01-01: qty 2000

## 2016-01-01 NOTE — ED Notes (Signed)
Pt returned from ct and placed on monitor 

## 2016-01-01 NOTE — H&P (Addendum)
History and Physical    Titus Rippel [id number] DOB: [birth year] DOA: 01/01/2016  PCP: [facility] Consultants:  None Patient coming from: home - lives with wife; NOK: wife, [phone] Chief Complaint: abdominal pain  HPI: Jaquaveon Vanos is a 65 y.o. male with medical history significant of CVA with marked expressive aphasia; renal cell CA s/o nephrectomy (remote); HTN; HLD; CHF; CAD; and afib presenting with abdominal pain.  The history is somewhat difficult to interpret due to the patient's expressive aphasia.  He reports that he has had difficulty sleeping, chills since the early weekend.  This AM, 101.7.  Generalized myalgias.  Only 1 kidney - nephrectomy for malignancy in 2015.  +rhinorrhea per wife (patient denies).  Occasional coughing spells.  Mild SOB.  Denies urinary symptoms.   ED Course: Per Dr. Winfred Leeds: Diagnosis #1 sepsis #2 urinary tract infection #3 hypokalemia #4 acute kidney injury  Per Dr. Roxanne Mins: CT scan shows evidence of pyelonephritis. Urinalysis also shows evidence of infection with positive nitrite and too numerous to count WBCs. Case is discussed with Dr. Lorin Mercy of [facility] who agrees to admit the patient.  Review of Systems: As per HPI; otherwise 10 point review of systems reviewed and negative as best I can tell by patient responses.      Past Medical History:  Diagnosis Date  . Anemia   . Anxiety   . Atrial fibrillation ([place])   . CAD (coronary artery disease)    PCI to LAD 2012  . CHF (congestive heart failure) ([place])   . Depression    [name] 07/19/2005 (09/30/2012)  . Hepatitis C    pt denies this hx on 09/30/2012  . High cholesterol   . History of DVT of lower extremity    "left; long time ago" (09/30/2012)  . Hypertension   . Renal cell carcinoma ([place])   . Stroke [facility]) 11/18/2011   [name] 07/31/2012; expressive aphasia noted on 09/30/2012    Past Surgical History:  Procedure Laterality Date  . COLONOSCOPY N/A  10/01/2012   Procedure: COLONOSCOPY;  Surgeon: Inda Castle, MD;  Location: [place];  Service: Endoscopy;  Laterality: N/A;  . CORONARY ANGIOPLASTY WITH STENT PLACEMENT    . ESOPHAGOGASTRODUODENOSCOPY N/A 10/01/2012   Procedure: ESOPHAGOGASTRODUODENOSCOPY (EGD);  Surgeon: Inda Castle, MD;  Location: [place];  Service: Endoscopy;  Laterality: N/A;  . EYE SURGERY Right 1975   /notes 07/19/2005; "injured playing football" (09/29/2012)  . GIVENS CAPSULE STUDY N/A 10/02/2012   Procedure: GIVENS CAPSULE STUDY;  Surgeon: Inda Castle, MD;  Location: [place];  Service: Endoscopy;  Laterality: N/A;  . JOINT REPLACEMENT  01/03/2007  . LUMBAR DISC SURGERY  2001   herniated disc/notes 07/19/2005 (09/30/2012)  . NEPHRECTOMY  12/26/2013   Laparascopic , [facility]  . TOTAL HIP ARTHROPLASTY Left 01/03/2007   [name] 01/03/2007 (09/30/2012)  . TOTAL KNEE ARTHROPLASTY Left 09/12/2014   Procedure: LEFT TOTAL KNEE ARTHROPLASTY;  Surgeon: Leandrew Koyanagi, MD;  Location: [place];  Service: Orthopedics;  Laterality: Left;    Social History   Social History  . Marital status: Married    Spouse name: N/A  . Number of children: N/A  . Years of education: N/A   Occupational History  . disabled; volunteers with [employer]    Social History Main Topics  . Smoking status: Never Smoker  . Smokeless tobacco: Never Used  . Alcohol use Yes     Comment: drinks about 1-2 drinks once a week  . Drug use:  No     Comment: 09/30/2012 "stopped a long time ago"  . Sexual activity: Yes   Other Topics Concern  . Not on file   Social History Narrative  . No narrative on file    No Known Allergies  Family History  Problem Relation Age of Onset  . Hypertension Mother   . Deep vein thrombosis Mother     Prior to Admission medications   Medication Sig Start Date End Date Taking? Authorizing Provider  Acetaminophen (ARTHRITIS PAIN RELIEF PO) Take 1 tablet by mouth daily as needed. For pain   Yes Historical  Provider, MD  amLODipine (NORVASC) 10 MG tablet Take 5 mg by mouth daily.   Yes Historical Provider, MD  aspirin 81 MG chewable tablet Chew 81 mg by mouth daily.   Yes Historical Provider, MD  Cholecalciferol (VITAMIN D3) 2000 units TABS Take 1 tablet by mouth 2 (two) times daily.   Yes Historical Provider, MD  ferrous sulfate 325 (65 FE) MG tablet Take 325 mg by mouth 2 (two) times daily with a meal.   Yes Historical Provider, MD  folic acid (FOLVITE) 1 MG tablet Take 1 mg by mouth daily.   Yes Historical Provider, MD  gabapentin (NEURONTIN) 300 MG capsule Take 300 mg by mouth 2 (two) times daily.   Yes Historical Provider, MD  hydrALAZINE (APRESOLINE) 25 MG tablet Take 1 tablet (25 mg total) by mouth 2 (two) times daily. 02/07/14  Yes Albertine Patricia, MD  metoprolol tartrate (LOPRESSOR) 25 MG tablet Take 12.5 mg by mouth 2 (two) times daily.   Yes Historical Provider, MD  Rivaroxaban (XARELTO) 15 MG TABS tablet Take 15 mg by mouth daily. Take 20mg  if creatinine is less than 1.3   Yes Historical Provider, MD  senna (SENOKOT) 8.6 MG tablet Take 1 tablet by mouth daily as needed for constipation.   Yes Historical Provider, MD  sildenafil (VIAGRA) 100 MG tablet Take 50 mg by mouth daily as needed for erectile dysfunction. Do not exceed 1 tablet in 24 hours   Yes Historical Provider, MD  tamsulosin (FLOMAX) 0.4 MG CAPS capsule Take 0.4 mg by mouth daily. For fluid    Yes Historical Provider, MD  thiamine (VITAMIN B-1) 100 MG tablet Take 100 mg by mouth daily.   Yes Historical Provider, MD    Physical Exam: Vitals:   01/01/16 1900 01/01/16 1915 01/01/16 2012 01/01/16 2059  BP: 131/99 139/90 (!) 138/104 (!) 131/97  Pulse: (!) 25 90 83 76  Resp: 15 16 18 18   Temp:   98 F (36.7 C)   TempSrc:   Oral   SpO2: 95% 100% 100% 100%  Weight:   95 kg (209 lb 8 oz)   Height:   6' 3.6" (1.92 m)      General:  Appears calm and comfortable and is NAD Eyes:  PERRL, EOMI, normal lids, iris ENT:   grossly normal hearing, lips & tongue, mmm Neck:  no LAD, masses or thyromegaly Cardiovascular:  RRR, no m/r/g. No LE edema.  Respiratory:  CTA bilaterally, no w/r/r. Normal respiratory effort. Abdomen:  soft, ntnd, NABS Skin:  no rash or induration seen on limited exam Musculoskeletal:  grossly normal tone BUE/BLE, good ROM, no bony abnormality Psychiatric:  grossly normal mood and affect, speech is disarticulated and difficult to follow, AOx3 Neurologic:  CN 2-12 grossly intact, moves all extremities in coordinated fashion, sensation intact  Labs on Admission: I have personally reviewed following labs and imaging studies  CBC:  Recent Labs Lab 01/01/16 1254 01/01/16 2327  WBC 8.7 9.8  HGB 13.1 12.1*  HCT 38.0* 36.4*  MCV 84.1 84.8  PLT 115* [id number]*   Basic Metabolic Panel:  Recent Labs Lab 01/01/16 1254 01/01/16 2327  NA 134* 137  K 3.4* 5.0  CL 103 104  CO2 20* 26  GLUCOSE 107* 122*  BUN 17 20  CREATININE 2.01* 1.96*  CALCIUM 9.1 8.7*   GFR: Estimated Creatinine Clearance: 46.3 mL/min (by C-G formula based on SCr of 1.96 mg/dL (H)). Liver Function Tests:  Recent Labs Lab 01/01/16 1254  AST 30  ALT 21  ALKPHOS 59  BILITOT 2.1*  PROT 6.9  ALBUMIN 3.2*    Recent Labs Lab 01/01/16 1254  LIPASE 24   No results for input(s): AMMONIA in the last 168 hours. Coagulation Profile:  Recent Labs Lab 01/01/16 2038  INR 1.44   Cardiac Enzymes: No results for input(s): CKTOTAL, CKMB, CKMBINDEX, TROPONINI in the last 168 hours. BNP (last 3 results) No results for input(s): PROBNP in the last 8760 hours. HbA1C: No results for input(s): HGBA1C in the last 72 hours. CBG: No results for input(s): GLUCAP in the last 168 hours. Lipid Profile: No results for input(s): CHOL, HDL, LDLCALC, TRIG, CHOLHDL, LDLDIRECT in the last 72 hours. Thyroid Function Tests: No results for input(s): TSH, T4TOTAL, FREET4, T3FREE, THYROIDAB in the last 72 hours. Anemia Panel: No  results for input(s): VITAMINB12, FOLATE, FERRITIN, TIBC, IRON, RETICCTPCT in the last 72 hours. Urine analysis:    Component Value Date/Time   COLORURINE YELLOW 01/01/2016 1715   APPEARANCEUR HAZY (A) 01/01/2016 1715   LABSPEC 1.009 01/01/2016 1715   PHURINE 5.5 01/01/2016 1715   GLUCOSEU NEGATIVE 01/01/2016 1715   HGBUR MODERATE (A) 01/01/2016 1715   BILIRUBINUR NEGATIVE 01/01/2016 1715   KETONESUR NEGATIVE 01/01/2016 1715   PROTEINUR 30 (A) 01/01/2016 1715   UROBILINOGEN 1.0 09/04/2014 1430   NITRITE POSITIVE (A) 01/01/2016 1715   LEUKOCYTESUR LARGE (A) 01/01/2016 1715    Creatinine Clearance: Estimated Creatinine Clearance: 46.3 mL/min (by C-G formula based on SCr of 1.96 mg/dL (H)).  Sepsis Labs: @LABRCNTIP (procalcitonin:4,lacticidven:4) )No results found for this or any previous visit (from the past 240 hour(s)).   Radiological Exams on Admission: Ct Abdomen Pelvis Wo Contrast  Result Date: 01/01/2016 CLINICAL DATA:  Cough and feels sick. Abdominal pain. History of renal cell carcinoma. EXAM: CT ABDOMEN AND PELVIS WITHOUT CONTRAST TECHNIQUE: Multidetector CT imaging of the abdomen and pelvis was performed following the standard protocol without IV contrast. COMPARISON:  02/02/2014 and 08/12/2015 FINDINGS: Lower chest: Scarring and atelectasis at the right lung base. Mild scarring or atelectasis at the left lung base. No pleural effusions. Small pleural-based nodular density along the left major fissure on sequence 205, image 5. This pleural-based nodule measures 5 mm and was present on study from 08/12/2015. Hepatobiliary: No acute abnormality in the liver or gallbladder. Pancreas: Normal appearance of the pancreas without inflammation or duct dilatation. Spleen: Normal appearance of spleen without enlargement. Adrenals/Urinary Tract: Right kidney has been removed. Again noted is a right adrenal nodule that measures 3.0 x 2.6 cm on sequence 201, image 40 and previously measured 2.4  x 2.2 cm. Hounsfield units measure roughly 27. This nodule appears to be enlarging. Normal appearance of the left adrenal gland. New left perinephric edema. There is also edema around the proximal left ureter and mid left ureter. There is no clear evidence for a stone in the left kidney or ureter. No  significant hydronephrosis. Urinary bladder is decompressed. Unfortunately, evaluation of the urinary bladder is somewhat limited from artifact from the left hip arthroplasty. Stomach/Bowel: Stomach is within normal limits. Appendix appears normal. No evidence of bowel wall thickening, distention, or inflammatory changes. Vascular/Lymphatic: No vascular findings. Small periaortic lymph nodes are nonspecific. Overall, there is not significant abdominal or pelvic lymphadenopathy. Reproductive: Limited evaluation of the prostate and seminal vesicles due to hardware artifact in the pelvis. Other: No significant free fluid. The left perinephric stranding is extending caudal and adjacent to the left psoas muscle. Musculoskeletal: Left hip arthroplasty is located. Again noted is marked sclerosis in the right femoral head. These right femoral head changes may be related to joint space narrowing and degenerative changes. Multilevel facet disease in lower lumbar spine. Disc space disease at L4-L5 and L5-S1. IMPRESSION: New left perinephric edema. There is no significant hydronephrosis and no definite kidney or ureter stones. Findings raise concern for left kidney inflammation such as pyelonephritis. Nonvisualized or recently passed stone cannot be excluded. Please note that evaluation of the bladder is limited due to the artifact from the left hip arthroplasty. Enlargement of the right adrenal nodule. This nodule was previously biopsied but the biopsy was inconclusive. Based on the history of renal cell carcinoma, metastatic lesion cannot be excluded and patient may benefit from an adrenal MRI for further characterization or  repeat biopsy. Volume loss and presumed scarring at the lung bases. Indeterminate pleural-based 5 mm nodule in the left lower lobe. No follow-up needed if patient is low-risk. Non-contrast chest CT can be considered in 12 months if patient is high-risk. This recommendation follows the consensus statement: Guidelines for Management of Incidental Pulmonary Nodules Detected on CT Images: From the Fleischner Society 2017; Radiology 2017; 284:228-243. Electronically Signed   By: Markus Daft M.D.   On: 01/01/2016 17:39   Dg Chest 2 View  Result Date: 01/01/2016 CLINICAL DATA:  Fever, weakness for 2 days EXAM: CHEST  2 VIEW COMPARISON:  02/02/2014 FINDINGS: Cardiomediastinal silhouette is stable. Mild elevation of the right hemidiaphragm. No infiltrate or pulmonary edema. Mild degenerative changes thoracic spine. IMPRESSION: No active cardiopulmonary disease. Electronically Signed   By: Lahoma Crocker M.D.   On: 01/01/2016 13:44    EKG: Independently reviewed.  Afib with rate 119; nonspecific ST changes with no evidence of acute ischemia  Assessment/Plan Principal Problem:   Pyelonephritis Active Problems:   Aphasia as late effect of cerebrovascular accident   Hypertension   A-fib (HCC)   Adrenal mass, right (HCC)   Solitary pulmonary nodule   H/O unilateral nephrectomy   Pyelonephritis -Patient presenting with abdominal/flank pain -CT findings c/w pyelonephritis of solitary kidney -UA shows large LE, positive nitrite, TNTC WBC, few bacteria -Lactate is normal, SIRS criteria of elevated HR with afib in triage which has resolved and fever to 101.7; procalcitonin is quite elevated -Does not have recent hospitalization so treatment for community acquired infection is reasonable -Will treat with Rocephin for now -Urine culture (and blood cultures) to follow  H/o nephrectomy, now with AKI -IVF hydration -Hold nephrotoxic agents  Aphasia, expressive -Patient is very kind and tries hard to  communicate but is very difficult to understand at times -This is stable  Afib, on anticoagulation with Xarelto -CHA2DS2-Vasc score is 3, indicating need for lifelong anticoagulation -Continue Xarelto  HTN -Continue Norvasc, Hydralazine, Lopressor  Adrenal mass -Concern for malignancy due to growth as well as h/o renal cell CA -Biopsy in 7/17 showed atypical cells but had scant cells  available for evaluation -Repeat biopsy should be considered either as inpatient or outpatient  Pulmonary nodule -May need outpatient f/u with repeat CT in 1 year if considered high risk  DVT prophylaxis: Xarelto Code Status:  Full - confirmed with patient/family Family Communication: Wife present throughout  Disposition Plan:  Home once clinically improved Consults called: none  Admission status: Admit - It is my clinical opinion that admission to INPATIENT is reasonable and necessary because this patient will require at least 2 midnights in the hospital to treat this condition based on the medical complexity of the problems presented.  Given the aforementioned information, the predictability of an adverse outcome is felt to be significant.    *Note: I was asked to call his daughter, a PA who lives in [place], at [phone].  Unfortunately, due to time constraints, it was much too late to make a routine phone call to update her on her father's status.  Will defer to day team in the AM.  Karmen Bongo MD [facility]  If 7PM-7AM, please contact night-coverage [email or web address] Password [email or web address]  01/02/2016, 2:17 AM

## 2016-01-01 NOTE — ED Notes (Signed)
Patient transported to X-ray 

## 2016-01-01 NOTE — Progress Notes (Signed)
Pharmacy Antibiotic Note  Frank Barron is a 65 y.o. male admitted on 01/01/2016 with sepsis.  Pharmacy has been consulted for vancomycin and zosyn dosing. CC fever, cough, abd pain. Febrile @ 101.7. WBC and Lactic WNL.  Plan: Vancomycin 2000 mg IV x1 then 1250 mg IV q24h Zosyn 3.375 g IV 30 min infusion then 3.375 g IV extended infusion q8h Monitor clinical course and renal fx     Temp (24hrs), Avg:101 F (38.3 C), Min:100 F (37.8 C), Max:101.9 F (38.8 C)   Recent Labs Lab 01/01/16 1254 01/01/16 1408  WBC 8.7  --   CREATININE 2.01*  --   LATICACIDVEN  --  1.62    CrCl cannot be calculated (Unknown ideal weight.).   Normalized clearance 38 mL/min. SCr 2 (baseline ~1.7.)  No Known Allergies  Antimicrobials this admission: Vancomycin 10/19 >> Zosyn 10/19 >>  Dose adjustments this admission:  Microbiology results: 10/19 BCx: Sent 10/19 UCx: Sent  Thank you for allowing pharmacy to be a part of this patient's care.  Cheral Almas, PharmD Candidate 01/01/2016 2:45 PM

## 2016-01-01 NOTE — ED Triage Notes (Addendum)
Pt has had a cough, feels sicks. Pt groaning stating his abd hurts. Denies n/v/d. Pt feels like he may have the flu. Pt's significant other states he has had a fever 101.7. Pt is alert and oriented.

## 2016-01-01 NOTE — ED Provider Notes (Signed)
CT scan shows evidence of pyelonephritis. Urinalysis also shows evidence of infection with positive nitrite and too numerous to count WBCs. Case is discussed with Dr. Lorin Mercy of [facility] who agrees to admit the patient.  Results for orders placed or performed during the hospital encounter of 01/01/16  Comprehensive metabolic panel  Result Value Ref Range   Sodium 134 (L) 135 - 145 mmol/L   Potassium 3.4 (L) 3.5 - 5.1 mmol/L   Chloride 103 101 - 111 mmol/L   CO2 20 (L) 22 - 32 mmol/L   Glucose, Bld 107 (H) 65 - 99 mg/dL   BUN 17 6 - 20 mg/dL   Creatinine, Ser 2.01 (H) 0.61 - 1.24 mg/dL   Calcium 9.1 8.9 - 10.3 mg/dL   Total Protein 6.9 6.5 - 8.1 g/dL   Albumin 3.2 (L) 3.5 - 5.0 g/dL   AST 30 15 - 41 U/L   ALT 21 17 - 63 U/L   Alkaline Phosphatase 59 38 - 126 U/L   Total Bilirubin 2.1 (H) 0.3 - 1.2 mg/dL   GFR calc non Af Amer 33 (L) >60 mL/min   GFR calc Af Amer 39 (L) >60 mL/min   Anion gap 11 5 - 15  Urinalysis, Routine w reflex microscopic  Result Value Ref Range   Color, Urine YELLOW YELLOW   APPearance HAZY (A) CLEAR   Specific Gravity, Urine 1.009 1.005 - 1.030   pH 5.5 5.0 - 8.0   Glucose, UA NEGATIVE NEGATIVE mg/dL   Hgb urine dipstick MODERATE (A) NEGATIVE   Bilirubin Urine NEGATIVE NEGATIVE   Ketones, ur NEGATIVE NEGATIVE mg/dL   Protein, ur 30 (A) NEGATIVE mg/dL   Nitrite POSITIVE (A) NEGATIVE   Leukocytes, UA LARGE (A) NEGATIVE  Lipase, blood  Result Value Ref Range   Lipase 24 11 - 51 U/L  CBC  Result Value Ref Range   WBC 8.7 4.0 - 10.5 K/uL   RBC 4.52 4.22 - 5.81 MIL/uL   Hemoglobin 13.1 13.0 - 17.0 g/dL   HCT 38.0 (L) 39.0 - 52.0 %   MCV 84.1 78.0 - 100.0 fL   MCH 29.0 26.0 - 34.0 pg   MCHC 34.5 30.0 - 36.0 g/dL   RDW 19.4 (H) 11.5 - 15.5 %   Platelets 115 (L) 150 - 400 K/uL  Urine microscopic-add on  Result Value Ref Range   Squamous Epithelial / LPF 0-5 (A) NONE SEEN   WBC, UA TOO NUMEROUS TO COUNT 0 - 5 WBC/hpf   RBC / HPF 0-5 0 - 5  RBC/hpf   Bacteria, UA FEW (A) NONE SEEN  I-Stat CG4 Lactic Acid, ED  Result Value Ref Range   Lactic Acid, Venous 1.62 0.5 - 1.9 mmol/L   Ct Abdomen Pelvis Wo Contrast  Result Date: 01/01/2016 CLINICAL DATA:  Cough and feels sick. Abdominal pain. History of renal cell carcinoma. EXAM: CT ABDOMEN AND PELVIS WITHOUT CONTRAST TECHNIQUE: Multidetector CT imaging of the abdomen and pelvis was performed following the standard protocol without IV contrast. COMPARISON:  02/02/2014 and 08/12/2015 FINDINGS: Lower chest: Scarring and atelectasis at the right lung base. Mild scarring or atelectasis at the left lung base. No pleural effusions. Small pleural-based nodular density along the left major fissure on sequence 205, image 5. This pleural-based nodule measures 5 mm and was present on study from 08/12/2015. Hepatobiliary: No acute abnormality in the liver or gallbladder. Pancreas: Normal appearance of the pancreas without inflammation or duct dilatation. Spleen: Normal appearance of spleen without enlargement.  Adrenals/Urinary Tract: Right kidney has been removed. Again noted is a right adrenal nodule that measures 3.0 x 2.6 cm on sequence 201, image 40 and previously measured 2.4 x 2.2 cm. Hounsfield units measure roughly 27. This nodule appears to be enlarging. Normal appearance of the left adrenal gland. New left perinephric edema. There is also edema around the proximal left ureter and mid left ureter. There is no clear evidence for a stone in the left kidney or ureter. No significant hydronephrosis. Urinary bladder is decompressed. Unfortunately, evaluation of the urinary bladder is somewhat limited from artifact from the left hip arthroplasty. Stomach/Bowel: Stomach is within normal limits. Appendix appears normal. No evidence of bowel wall thickening, distention, or inflammatory changes. Vascular/Lymphatic: No vascular findings. Small periaortic lymph nodes are nonspecific. Overall, there is not  significant abdominal or pelvic lymphadenopathy. Reproductive: Limited evaluation of the prostate and seminal vesicles due to hardware artifact in the pelvis. Other: No significant free fluid. The left perinephric stranding is extending caudal and adjacent to the left psoas muscle. Musculoskeletal: Left hip arthroplasty is located. Again noted is marked sclerosis in the right femoral head. These right femoral head changes may be related to joint space narrowing and degenerative changes. Multilevel facet disease in lower lumbar spine. Disc space disease at L4-L5 and L5-S1. IMPRESSION: New left perinephric edema. There is no significant hydronephrosis and no definite kidney or ureter stones. Findings raise concern for left kidney inflammation such as pyelonephritis. Nonvisualized or recently passed stone cannot be excluded. Please note that evaluation of the bladder is limited due to the artifact from the left hip arthroplasty. Enlargement of the right adrenal nodule. This nodule was previously biopsied but the biopsy was inconclusive. Based on the history of renal cell carcinoma, metastatic lesion cannot be excluded and patient may benefit from an adrenal MRI for further characterization or repeat biopsy. Volume loss and presumed scarring at the lung bases. Indeterminate pleural-based 5 mm nodule in the left lower lobe. No follow-up needed if patient is low-risk. Non-contrast chest CT can be considered in 12 months if patient is high-risk. This recommendation follows the consensus statement: Guidelines for Management of Incidental Pulmonary Nodules Detected on CT Images: From the Fleischner Society 2017; Radiology 2017; 284:228-243. Electronically Signed   By: Markus Daft M.D.   On: 01/01/2016 17:39   Dg Chest 2 View  Result Date: 01/01/2016 CLINICAL DATA:  Fever, weakness for 2 days EXAM: CHEST  2 VIEW COMPARISON:  02/02/2014 FINDINGS: Cardiomediastinal silhouette is stable. Mild elevation of the right  hemidiaphragm. No infiltrate or pulmonary edema. Mild degenerative changes thoracic spine. IMPRESSION: No active cardiopulmonary disease. Electronically Signed   By: Lahoma Crocker M.D.   On: 01/01/2016 [id number]      Delora Fuel, [place] [id number]

## 2016-01-01 NOTE — ED Notes (Signed)
atempted to call report

## 2016-01-01 NOTE — Progress Notes (Signed)
Pt arrived floor, settled in the room. Meal served. Rapid response team called. Will continue to monitor.

## 2016-01-01 NOTE — ED Provider Notes (Signed)
[facility] DEPT Provider Note   CSN: [id number] Arrival date & time: 01/01/16  1222     History   Chief Complaint Chief Complaint  Patient presents with  . Abdominal Pain    HPI Frank Barron is a 65 y.o. 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 History is obtained from patient and from patient's wife  HPI Patient with diffuse vague abdominal discomfort onset 3 days ago. Noted to have temperature 101.7. His abdomen looks distended to his wife and feels distended. She treated him with Alka-Seltzer plus this morning. No cough. No urinary symptoms. No other associated symptoms. Nothing makes symptoms better or worse. Presently patient asymptomatic Past Medical History:  Diagnosis Date  . Anemia   . Anxiety   . Atrial fibrillation ([place])   . CAD (coronary artery disease)    PCI to LAD 2012  . CHF (congestive heart failure) ([place])   . Depression    [name] 07/19/2005 (09/30/2012)  . Hepatitis C    pt denies this hx on 09/30/2012  . High cholesterol   . History of DVT of lower extremity    "left; long time ago" (09/30/2012)  . Hypertension   . Renal cell carcinoma ([place])   . Stroke [facility]) 11/18/2011   [name] 07/31/2012; expressive aphasia noted on 09/30/2012    Patient Active Problem List   Diagnosis Date Noted  . S/P total knee replacement using cement 09/12/2014  . Urinary retention   . Cocaine abuse   . Acute encephalopathy   . Altered mental status 02/02/2014  . Acute renal failure ([place]) 02/02/2014  . Hypertension 02/02/2014  . DVT (deep venous thrombosis) ([place]) 02/02/2014  . Altered mental state   . CAD (coronary artery disease)   . Anemia 09/29/2012  . Melena 09/29/2012  . Aphasia as late effect of cerebrovascular accident 07/03/2012  . 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  . 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 COLONOSCOPY N/A 10/01/2012   Procedure: COLONOSCOPY;  Surgeon: Inda Castle, MD;   Location: [place];  Service: Endoscopy;  Laterality: N/A;  . CORONARY ANGIOPLASTY WITH STENT PLACEMENT    . ESOPHAGOGASTRODUODENOSCOPY N/A 10/01/2012   Procedure: ESOPHAGOGASTRODUODENOSCOPY (EGD);  Surgeon: Inda Castle, MD;  Location: [place];  Service: Endoscopy;  Laterality: N/A;  . EYE SURGERY Right 1975   /notes 07/19/2005; "injured playing football" (09/29/2012)  . GIVENS CAPSULE STUDY N/A 10/02/2012   Procedure: GIVENS CAPSULE STUDY;  Surgeon: Inda Castle, MD;  Location: [place];  Service: Endoscopy;  Laterality: N/A;  . JOINT REPLACEMENT  01/03/2007  . LUMBAR DISC SURGERY  2001   herniated disc/notes 07/19/2005 (09/30/2012)  . NEPHRECTOMY  12/26/2013   [facility]arascopic , [facility]  . TOTAL HIP ARTHROPLASTY Left 01/03/2007   [name] 01/03/2007 (09/30/2012)  . TOTAL KNEE ARTHROPLASTY Left 09/12/2014   Procedure: LEFT TOTAL KNEE ARTHROPLASTY;  Surgeon: Leandrew Koyanagi, MD;  Location: [place];  Service: Orthopedics;  Laterality: Left;       Home Medications    Prior to Admission medications   Medication Sig Start Date End Date Taking? Authorizing Provider  amLODipine (NORVASC) 10 MG tablet Take 5 mg by mouth daily.    Historical Provider, MD  aspirin 81 MG chewable tablet Chew 81 mg by mouth daily.    Historical Provider, MD  citalopram (CELEXA) 20 MG tablet Take 10 mg by mouth daily.    Historical Provider, MD  cyclobenzaprine (FLEXERIL) 10 MG tablet Take  5 mg by mouth 3 (three) times daily as needed for muscle spasms.    Historical Provider, MD  ferrous sulfate 325 (65 FE) MG tablet Take 325 mg by mouth 2 (two) times daily with a meal.    Historical Provider, MD  hydrALAZINE (APRESOLINE) 25 MG tablet Take 1 tablet (25 mg total) by mouth 2 (two) times daily. 02/07/14   Silver Huguenin Elgergawy, MD  metoprolol tartrate (LOPRESSOR) 25 MG tablet Take 12.5 mg by mouth 2 (two) times daily.    Historical Provider, MD  sildenafil (VIAGRA) 100 MG tablet Take 50 mg by mouth daily as  needed for erectile dysfunction. Do not exceed 1 tablet in 24 hours    Historical Provider, MD  simvastatin (ZOCOR) 40 MG tablet Take 20 mg by mouth at bedtime.    Historical Provider, MD    Family History Family History  Problem Relation Age of Onset  . Hypertension Mother   . Deep vein thrombosis Mother     Social History Social History  Substance Use Topics  . Smoking status: Never Smoker  . Smokeless tobacco: Never Used  . Alcohol use Yes     Comment: 09/30/2012 "drink a few beers/wk; don't drink qd"     Allergies   Review of patient's allergies indicates no known allergies.   Review of Systems Review of Systems  Unable to perform ROS: Other  Constitutional: Positive for fever.  Gastrointestinal: Positive for abdominal distention and abdominal pain.   speech impaired chronically   Physical Exam Updated Vital Signs BP 110/70   Pulse (!) 48   Temp 101.9 F (38.8 C) (Rectal)   Resp (!) 28   SpO2 95%   Physical Exam  Constitutional: He appears well-developed and well-nourished.  HENT:  Head: Normocephalic and atraumatic.  Eyes: Conjunctivae are normal. Pupils are equal, round, and reactive to light.  Neck: Neck supple. No tracheal deviation present. No thyromegaly present.  Cardiovascular: Normal rate.   No murmur heard. Irregularly regular  Pulmonary/Chest: Effort normal and breath sounds normal.  Abdominal: Soft. Bowel sounds are normal. He exhibits no distension. There is no tenderness.  Musculoskeletal: Normal range of motion. He exhibits no edema or tenderness.  Neurological: He is alert. Coordination normal.  Skin: Skin is warm and dry. No rash noted.  Psychiatric: He has a normal mood and affect.  Nursing note and vitals reviewed.    ED Treatments / Results  Labs (all labs ordered are listed, but only abnormal results are displayed) Labs Reviewed  COMPREHENSIVE METABOLIC PANEL - Abnormal; Notable for the following:       Result Value   Sodium  134 (*)    Potassium 3.4 (*)    CO2 20 (*)    Glucose, Bld 107 (*)    Creatinine, Ser 2.01 (*)    Albumin 3.2 (*)    Total Bilirubin 2.1 (*)    GFR calc non Af Amer 33 (*)    GFR calc Af Amer 39 (*)    All other components within normal limits  CBC - Abnormal; Notable for the following:    HCT 38.0 (*)    RDW 19.4 (*)    Platelets 115 (*)    All other components within normal limits  CULTURE, BLOOD (ROUTINE X 2)  CULTURE, BLOOD (ROUTINE X 2)  URINE CULTURE  LIPASE, BLOOD  URINALYSIS, ROUTINE W REFLEX MICROSCOPIC (NOT AT [facility])  I-STAT CG4 LACTIC ACID, ED  I-STAT CG4 LACTIC ACID, ED    EKG  EKG Interpretation  Date/Time:  Thursday January 01 2016 13:49:29 EDT Ventricular Rate:  119 PR Interval:    QRS Duration: 93 QT Interval:  322 QTC Calculation: 453 R Axis:   18 Text Interpretation:  Atrial fibrillation Ventricular premature complex Abnormal R-wave progression, early transition Borderline T wave abnormalities No significant change since last tracing Confirmed by Winfred Leeds  MD,  [phone]) on 01/01/2016 2:41:30 PM      Results for orders placed or performed during the hospital encounter of 01/01/16  Comprehensive metabolic panel  Result Value Ref Range   Sodium 134 (L) 135 - 145 mmol/L   Potassium 3.4 (L) 3.5 - 5.1 mmol/L   Chloride 103 101 - 111 mmol/L   CO2 20 (L) 22 - 32 mmol/L   Glucose, Bld 107 (H) 65 - 99 mg/dL   BUN 17 6 - 20 mg/dL   Creatinine, Ser 2.01 (H) 0.61 - 1.24 mg/dL   Calcium 9.1 8.9 - 10.3 mg/dL   Total Protein 6.9 6.5 - 8.1 g/dL   Albumin 3.2 (L) 3.5 - 5.0 g/dL   AST 30 15 - 41 U/L   ALT 21 17 - 63 U/L   Alkaline Phosphatase 59 38 - 126 U/L   Total Bilirubin 2.1 (H) 0.3 - 1.2 mg/dL   GFR calc non Af Amer 33 (L) >60 mL/min   GFR calc Af Amer 39 (L) >60 mL/min   Anion gap 11 5 - 15  Urinalysis, Routine w reflex microscopic  Result Value Ref Range   Color, Urine YELLOW YELLOW   APPearance HAZY (A) CLEAR   Specific Gravity, Urine 1.009  1.005 - 1.030   pH 5.5 5.0 - 8.0   Glucose, UA NEGATIVE NEGATIVE mg/dL   Hgb urine dipstick MODERATE (A) NEGATIVE   Bilirubin Urine NEGATIVE NEGATIVE   Ketones, ur NEGATIVE NEGATIVE mg/dL   Protein, ur 30 (A) NEGATIVE mg/dL   Nitrite POSITIVE (A) NEGATIVE   Leukocytes, UA LARGE (A) NEGATIVE  Lipase, blood  Result Value Ref Range   Lipase 24 11 - 51 U/L  CBC  Result Value Ref Range   WBC 8.7 4.0 - 10.5 K/uL   RBC 4.52 4.22 - 5.81 MIL/uL   Hemoglobin 13.1 13.0 - 17.0 g/dL   HCT 38.0 (L) 39.0 - 52.0 %   MCV 84.1 78.0 - 100.0 fL   MCH 29.0 26.0 - 34.0 pg   MCHC 34.5 30.0 - 36.0 g/dL   RDW 19.4 (H) 11.5 - 15.5 %   Platelets 115 (L) 150 - 400 K/uL  Urine microscopic-add on  Result Value Ref Range   Squamous Epithelial / LPF 0-5 (A) NONE SEEN   WBC, UA TOO NUMEROUS TO COUNT 0 - 5 WBC/hpf   RBC / HPF 0-5 0 - 5 RBC/hpf   Bacteria, UA FEW (A) NONE SEEN  I-Stat CG4 Lactic Acid, ED  Result Value Ref Range   Lactic Acid, Venous 1.62 0.5 - 1.9 mmol/L   Ct Abdomen Pelvis Wo Contrast  Result Date: 01/01/2016 CLINICAL DATA:  Cough and feels sick. Abdominal pain. History of renal cell carcinoma. EXAM: CT ABDOMEN AND PELVIS WITHOUT CONTRAST TECHNIQUE: Multidetector CT imaging of the abdomen and pelvis was performed following the standard protocol without IV contrast. COMPARISON:  02/02/2014 and 08/12/2015 FINDINGS: Lower chest: Scarring and atelectasis at the right lung base. Mild scarring or atelectasis at the left lung base. No pleural effusions. Small pleural-based nodular density along the left Frank fissure on sequence 205, image 5. This pleural-based nodule  measures 5 mm and was present on study from 08/12/2015. Hepatobiliary: No acute abnormality in the liver or gallbladder. Pancreas: Normal appearance of the pancreas without inflammation or duct dilatation. Spleen: Normal appearance of spleen without enlargement. Adrenals/Urinary Tract: Right kidney has been removed. Again noted is a right  adrenal nodule that measures 3.0 x 2.6 cm on sequence 201, image 40 and previously measured 2.4 x 2.2 cm. Hounsfield units measure roughly 27. This nodule appears to be enlarging. Normal appearance of the left adrenal gland. New left perinephric edema. There is also edema around the proximal left ureter and mid left ureter. There is no clear evidence for a stone in the left kidney or ureter. No significant hydronephrosis. Urinary bladder is decompressed. Unfortunately, evaluation of the urinary bladder is somewhat limited from artifact from the left hip arthroplasty. Stomach/Bowel: Stomach is within normal limits. Appendix appears normal. No evidence of bowel wall thickening, distention, or inflammatory changes. Vascular/Lymphatic: No vascular findings. Small periaortic lymph nodes are nonspecific. Overall, there is not significant abdominal or pelvic lymphadenopathy. Reproductive: Limited evaluation of the prostate and seminal vesicles due to hardware artifact in the pelvis. Other: No significant free fluid. The left perinephric stranding is extending caudal and adjacent to the left psoas muscle. Musculoskeletal: Left hip arthroplasty is located. Again noted is marked sclerosis in the right femoral head. These right femoral head changes may be related to joint space narrowing and degenerative changes. Multilevel facet disease in lower lumbar spine. Disc space disease at L4-L5 and L5-S1. IMPRESSION: New left perinephric edema. There is no significant hydronephrosis and no definite kidney or ureter stones. Findings raise concern for left kidney inflammation such as pyelonephritis. Nonvisualized or recently passed stone cannot be excluded. Please note that evaluation of the bladder is limited due to the artifact from the left hip arthroplasty. Enlargement of the right adrenal nodule. This nodule was previously biopsied but the biopsy was inconclusive. Based on the history of renal cell carcinoma, metastatic lesion  cannot be excluded and patient may benefit from an adrenal MRI for further characterization or repeat biopsy. Volume loss and presumed scarring at the lung bases. Indeterminate pleural-based 5 mm nodule in the left lower lobe. No follow-up needed if patient is low-risk. Non-contrast chest CT can be considered in 12 months if patient is high-risk. This recommendation follows the consensus statement: Guidelines for Management of Incidental Pulmonary Nodules Detected on CT Images: From the Fleischner Society 2017; Radiology 2017; 284:228-243. Electronically Signed   By: Markus Daft M.D.   On: 01/01/2016 17:39   Dg Chest 2 View  Result Date: 01/01/2016 CLINICAL DATA:  Fever, weakness for 2 days EXAM: CHEST  2 VIEW COMPARISON:  02/02/2014 FINDINGS: Cardiomediastinal silhouette is stable. Mild elevation of the right hemidiaphragm. No infiltrate or pulmonary edema. Mild degenerative changes thoracic spine. IMPRESSION: No active cardiopulmonary disease. Electronically Signed   By: Lahoma Crocker M.D.   On: 01/01/2016 13:44    Radiology Dg Chest 2 View  Result Date: 01/01/2016 CLINICAL DATA:  Fever, weakness for 2 days EXAM: CHEST  2 VIEW COMPARISON:  02/02/2014 FINDINGS: Cardiomediastinal silhouette is stable. Mild elevation of the right hemidiaphragm. No infiltrate or pulmonary edema. Mild degenerative changes thoracic spine. IMPRESSION: No active cardiopulmonary disease. Electronically Signed   By: Lahoma Crocker M.D.   On: 01/01/2016 13:44    Procedures Procedures (including critical care time)  Medications Ordered in ED Medications  piperacillin-tazobactam (ZOSYN) IVPB 3.375 g (not administered)  vancomycin (VANCOCIN) IVPB 1000 mg/200 mL premix (  not administered)  acetaminophen (TYLENOL) tablet 1,000 mg (not administered)     Initial Impression / Assessment and Plan / ED Course  I have reviewed the triage vital signs and the nursing notes. Code sepsis called in light of fever, tachypnea. Source of  infection suspected to be intra-abdominal perttinent labs & imaging results that were available during my care of the patient were reviewed by me and considered in my medical decision making (see chart for details). 5 PM patient resting comfortably after treatment with intervenous antibiotics.pt signed out to Dr. Roxanne Mins [id number] pm Clinical Course      Final Clinical Impressions(s) / ED Diagnoses  Diagnosis #1 sepsis #2 urinary tract infection #3 hypokalemia #4 acute kidney injury CRITICAL CARE Performed by: Orlie Dakin Total critical care time: 30 minutes Critical care time was exclusive of separately billable procedures and treating other patients. Critical care was necessary to treat or prevent imminent or life-threatening deterioration. Critical care was time spent personally by me on the following activities: development of treatment plan with patient and/or surrogate as well as nursing, discussions with consultants, evaluation of patient's response to treatment, examination of patient, obtaining history from patient or surrogate, ordering and performing treatments and interventions, ordering and review of laboratory studies, ordering and review of radiographic studies, pulse oximetry and re-evaluation of patient's condition. Final diagnoses:  None   New Prescriptions New Prescriptions   No medications on file     Orlie Dakin, MD 01/01/16 1825

## 2016-01-02 DIAGNOSIS — I10 Essential (primary) hypertension: Secondary | ICD-10-CM

## 2016-01-02 DIAGNOSIS — N179 Acute kidney failure, unspecified: Secondary | ICD-10-CM | POA: Diagnosis present

## 2016-01-02 DIAGNOSIS — R911 Solitary pulmonary nodule: Secondary | ICD-10-CM | POA: Diagnosis present

## 2016-01-02 DIAGNOSIS — Z905 Acquired absence of kidney: Secondary | ICD-10-CM

## 2016-01-02 DIAGNOSIS — E279 Disorder of adrenal gland, unspecified: Secondary | ICD-10-CM

## 2016-01-02 DIAGNOSIS — E278 Other specified disorders of adrenal gland: Secondary | ICD-10-CM | POA: Diagnosis present

## 2016-01-02 DIAGNOSIS — N189 Chronic kidney disease, unspecified: Secondary | ICD-10-CM | POA: Diagnosis present

## 2016-01-02 DIAGNOSIS — I69320 Aphasia following cerebral infarction: Secondary | ICD-10-CM

## 2016-01-02 DIAGNOSIS — I4891 Unspecified atrial fibrillation: Secondary | ICD-10-CM | POA: Diagnosis present

## 2016-01-02 LAB — BASIC METABOLIC PANEL
ANION GAP: 7 (ref 5–15)
BUN: 20 mg/dL (ref 6–20)
CALCIUM: 8.7 mg/dL — AB (ref 8.9–10.3)
CO2: 26 mmol/L (ref 22–32)
Chloride: 104 mmol/L (ref 101–111)
Creatinine, Ser: 1.96 mg/dL — ABNORMAL HIGH (ref 0.61–1.24)
GFR, EST AFRICAN AMERICAN: 40 mL/min — AB (ref >60)
GFR, EST NON AFRICAN AMERICAN: 34 mL/min — AB (ref >60)
Glucose, Bld: 122 mg/dL — ABNORMAL HIGH (ref 65–99)
Potassium: 5 mmol/L (ref 3.5–5.1)
Sodium: 137 mmol/L (ref 135–145)

## 2016-01-02 LAB — CBC
HCT: 36.4 % — ABNORMAL LOW (ref 39.0–52.0)
HEMOGLOBIN: 12.1 g/dL — AB (ref 13.0–17.0)
MCH: 28.2 pg (ref 26.0–34.0)
MCHC: 33.2 g/dL (ref 30.0–36.0)
MCV: 84.8 fL (ref 78.0–100.0)
Platelets: 114 10*3/uL — ABNORMAL LOW (ref 150–400)
RBC: 4.29 MIL/uL (ref 4.22–5.81)
RDW: 19.7 % — ABNORMAL HIGH (ref 11.5–15.5)
WBC: 9.8 10*3/uL (ref 4.0–10.5)

## 2016-01-02 LAB — LACTIC ACID, PLASMA: LACTIC ACID, VENOUS: 1.7 mmol/L (ref 0.5–1.9)

## 2016-01-02 MED ORDER — DEXTROSE 5 % IV SOLN
1.0000 g | INTRAVENOUS | Status: DC
  Administered 2016-01-02: 1 g via INTRAVENOUS
  Filled 2016-01-02 (×2): qty 10

## 2016-01-02 NOTE — Care Management Note (Signed)
Case Management Note  Patient Details  Name: Frank Barron MRN: [id number] Date of Birth: 01/02/1951  Subjective/Objective:                 From home with wife, pyelonephritis, sepsis, fever   Action/Plan:   Expected Discharge Date:                  Expected Discharge Plan:  Home/Self Care  In-House Referral:  NA  Discharge planning Services  CM Consult  Post Acute Care Choice:    Choice offered to:     DME Arranged:    DME Agency:     HH Arranged:    HH Agency:     Status of Service:  In process, will continue to follow  If discussed at Long Length of Stay Meetings, dates discussed:    Additional Comments:  Carles Collet, RN 01/02/2016, 3:51 PM

## 2016-01-02 NOTE — Progress Notes (Addendum)
PROGRESS NOTE    Frank Barron  [id number] DOB: 1951-02-08 DOA: 01/01/2016 PCP: [facility]   Brief Narrative: Frank Barron is a 65 y.o. male with a history of CVA with expressive aphasia, renal cell carcinoma s/p nephrectomy, hypertension, hyperlipidemia, CHF, CAD, atrial fibrillation. Patient presented with abdominal pain and admitted for pyelonephritis.   Assessment & Plan:   Principal Problem:   Pyelonephritis Active Problems:   Aphasia as late effect of cerebrovascular accident   Hypertension   A-fib ([place])   Adrenal mass, right (H[facility])   Solitary pulmonary nodule   H/O unilateral nephrectomy   AKI (acute kidney injury) ([place])   Acute pyelonephritis Symptoms improved. Urine culture pending. Blood culture no growth to date -continue ceftriaxone -follow-up urine culture  AKI H/o nephrectomy Slightly improved creatinine -continue IVF  Asphasia, expressive Stable  Atrial fibrillation CHA2DS2-VASc Score is 3. -continue xarelto  Hypertension -continue norvasc, hydralazine, lopressor  Adrenal mass Interval growth seen. Recent biopsy performed significant for atypical cells. -recommend close follow-up as an outpatient  Pulmonary nodule Stable from may scan. Can be followed up with repeat CT scan if patient is high risk. Patient is a non-smoker. -outpatient management   DVT prophylaxis: Xarelto Code Status: Full code Family Communication: Attempted to call daughter via telephone Disposition Plan: Likely discharge home tomorrow.   Consultants:   None  Procedures:  None  Antimicrobials:  Ceftriaxone    Subjective: Patient reports feeling much better today. No concerns.  Objective: Vitals:   01/01/16 2059 01/02/16 0549 01/02/16 0705 01/02/16 0954  BP: (!) 131/97 112/76 119/83 118/78  Pulse: 76 68 81 98  Resp: 18 18 18 18   Temp:  98.1 F (36.7 C) 98.3 F (36.8 C)   TempSrc:  Oral Oral   SpO2: 100% 99% 100% 98%  Weight:       Height:        Intake/Output Summary (Last 24 hours) at 01/02/16 1259 Last data filed at 01/02/16 1239  Gross per 24 hour  Intake          1518.33 ml  Output             1575 ml  Net           -56.67 ml   Filed Weights   01/01/16 2012  Weight: 95 kg (209 lb 8 oz)    Examination:  General exam: Appears calm and comfortable Respiratory system: Clear to auscultation. Respiratory effort normal. Cardiovascular system: S1 & S2 heard, slight tachycardia with irregular rhythm. No murmurs, rubs, gallops or clicks. Gastrointestinal system: Abdomen is nondistended, soft and nontender. No organomegaly or masses felt. Normal bowel sounds heard. Central nervous system: Alert and oriented. No focal neurological deficits. Extremities: No edema. No calf tenderness Skin: No cyanosis. No rashes Psychiatry: Judgement and insight appear normal. Mood & affect appropriate.     Data Reviewed: I have personally reviewed following labs and imaging studies  CBC:  Recent Labs Lab 01/01/16 1254 01/01/16 2327  WBC 8.7 9.8  HGB 13.1 12.1*  HCT 38.0* 36.4*  MCV 84.1 84.8  PLT 115* [id number]*   Basic Metabolic Panel:  Recent Labs Lab 01/01/16 1254 01/01/16 2327  NA 134* 137  K 3.4* 5.0  CL 103 104  CO2 20* 26  GLUCOSE 107* 122*  BUN 17 20  CREATININE 2.01* 1.96*  CALCIUM 9.1 8.7*   GFR: Estimated Creatinine Clearance: 46.3 mL/min (by C-G formula based on SCr of 1.96 mg/dL (H)). Liver Function Tests:  Recent Labs  Lab 01/01/16 1254  AST 30  ALT 21  ALKPHOS 59  BILITOT 2.1*  PROT 6.9  ALBUMIN 3.2*    Recent Labs Lab 01/01/16 1254  LIPASE 24   No results for input(s): AMMONIA in the last 168 hours. Coagulation Profile:  Recent Labs Lab 01/01/16 2038  INR 1.44   Cardiac Enzymes: No results for input(s): CKTOTAL, CKMB, CKMBINDEX, TROPONINI in the last 168 hours. BNP (last 3 results) No results for input(s): PROBNP in the last 8760 hours. HbA1C: No results for  input(s): HGBA1C in the last 72 hours. CBG: No results for input(s): GLUCAP in the last 168 hours. Lipid Profile: No results for input(s): CHOL, HDL, LDLCALC, TRIG, CHOLHDL, LDLDIRECT in the last 72 hours. Thyroid Function Tests: No results for input(s): TSH, T4TOTAL, FREET4, T3FREE, THYROIDAB in the last 72 hours. Anemia Panel: No results for input(s): VITAMINB12, FOLATE, FERRITIN, TIBC, IRON, RETICCTPCT in the last 72 hours. Sepsis Labs:  Recent Labs Lab 01/01/16 1408 01/01/16 2038 01/01/16 2327  PROCALCITON  --  7.75  --   LATICACIDVEN 1.62 1.4 1.7    Recent Results (from the past 240 hour(s))  Culture, blood (Routine X 2)     Status: None (Preliminary result)   Collection Time: 01/01/16  2:00 PM  Result Value Ref Range Status   Specimen Description BLOOD LEFT FOREARM  Final   Special Requests BOTTLES DRAWN AEROBIC AND ANAEROBIC  5CC  Final   Culture NO GROWTH < 24 HOURS  Final   Report Status PENDING  Incomplete  Culture, blood (Routine X 2)     Status: None (Preliminary result)   Collection Time: 01/01/16  2:02 PM  Result Value Ref Range Status   Specimen Description BLOOD RIGHT FOREARM  Final   Special Requests BOTTLES DRAWN AEROBIC AND ANAEROBIC  3CC  Final   Culture NO GROWTH < 24 HOURS  Final   Report Status PENDING  Incomplete         Radiology Studies: Ct Abdomen Pelvis Wo Contrast  Result Date: 01/01/2016 CLINICAL DATA:  Cough and feels sick. Abdominal pain. History of renal cell carcinoma. EXAM: CT ABDOMEN AND PELVIS WITHOUT CONTRAST TECHNIQUE: Multidetector CT imaging of the abdomen and pelvis was performed following the standard protocol without IV contrast. COMPARISON:  02/02/2014 and 08/12/2015 FINDINGS: Lower chest: Scarring and atelectasis at the right lung base. Mild scarring or atelectasis at the left lung base. No pleural effusions. Small pleural-based nodular density along the left major fissure on sequence 205, image 5. This pleural-based nodule  measures 5 mm and was present on study from 08/12/2015. Hepatobiliary: No acute abnormality in the liver or gallbladder. Pancreas: Normal appearance of the pancreas without inflammation or duct dilatation. Spleen: Normal appearance of spleen without enlargement. Adrenals/Urinary Tract: Right kidney has been removed. Again noted is a right adrenal nodule that measures 3.0 x 2.6 cm on sequence 201, image 40 and previously measured 2.4 x 2.2 cm. Hounsfield units measure roughly 27. This nodule appears to be enlarging. Normal appearance of the left adrenal gland. New left perinephric edema. There is also edema around the proximal left ureter and mid left ureter. There is no clear evidence for a stone in the left kidney or ureter. No significant hydronephrosis. Urinary bladder is decompressed. Unfortunately, evaluation of the urinary bladder is somewhat limited from artifact from the left hip arthroplasty. Stomach/Bowel: Stomach is within normal limits. Appendix appears normal. No evidence of bowel wall thickening, distention, or inflammatory changes. Vascular/Lymphatic: No vascular findings. Small periaortic  lymph nodes are nonspecific. Overall, there is not significant abdominal or pelvic lymphadenopathy. Reproductive: Limited evaluation of the prostate and seminal vesicles due to hardware artifact in the pelvis. Other: No significant free fluid. The left perinephric stranding is extending caudal and adjacent to the left psoas muscle. Musculoskeletal: Left hip arthroplasty is located. Again noted is marked sclerosis in the right femoral head. These right femoral head changes may be related to joint space narrowing and degenerative changes. Multilevel facet disease in lower lumbar spine. Disc space disease at L4-L5 and L5-S1. IMPRESSION: New left perinephric edema. There is no significant hydronephrosis and no definite kidney or ureter stones. Findings raise concern for left kidney inflammation such as pyelonephritis.  Nonvisualized or recently passed stone cannot be excluded. Please note that evaluation of the bladder is limited due to the artifact from the left hip arthroplasty. Enlargement of the right adrenal nodule. This nodule was previously biopsied but the biopsy was inconclusive. Based on the history of renal cell carcinoma, metastatic lesion cannot be excluded and patient may benefit from an adrenal MRI for further characterization or repeat biopsy. Volume loss and presumed scarring at the lung bases. Indeterminate pleural-based 5 mm nodule in the left lower lobe. No follow-up needed if patient is low-risk. Non-contrast chest CT can be considered in 12 months if patient is high-risk. This recommendation follows the consensus statement: Guidelines for Management of Incidental Pulmonary Nodules Detected on CT Images: From the Fleischner Society 2017; Radiology 2017; 284:228-243. Electronically Signed   By: Markus Daft M.D.   On: 01/01/2016 17:39   Dg Chest 2 View  Result Date: 01/01/2016 CLINICAL DATA:  Fever, weakness for 2 days EXAM: CHEST  2 VIEW COMPARISON:  02/02/2014 FINDINGS: Cardiomediastinal silhouette is stable. Mild elevation of the right hemidiaphragm. No infiltrate or pulmonary edema. Mild degenerative changes thoracic spine. IMPRESSION: No active cardiopulmonary disease. Electronically Signed   By: Lahoma Crocker M.D.   On: 01/01/2016 13:44        Scheduled Meds: . amLODipine  5 mg Oral Daily  . aspirin  81 mg Oral Daily  . cefTRIAXone (ROCEPHIN)  IV  1 g Intravenous Q24H  . ferrous sulfate  325 mg Oral BID WC  . gabapentin  300 mg Oral BID  . hydrALAZINE  25 mg Oral BID  . metoprolol tartrate  12.5 mg Oral BID  . Rivaroxaban  15 mg Oral Daily  . tamsulosin  0.4 mg Oral Daily   Continuous Infusions: . lactated ringers 100 mL/hr at 01/02/16 0731     LOS: 1 day     Cordelia Poche [facility] 01/02/2016, 12:59 PM Pager: [phone]  If 7PM-7AM, please contact  night-coverage [email or web address] Password [email or web address] 01/02/2016, 12:59 PM

## 2016-01-03 DIAGNOSIS — R6520 Severe sepsis without septic shock: Secondary | ICD-10-CM

## 2016-01-03 DIAGNOSIS — A419 Sepsis, unspecified organism: Principal | ICD-10-CM

## 2016-01-03 DIAGNOSIS — I4891 Unspecified atrial fibrillation: Secondary | ICD-10-CM

## 2016-01-03 DIAGNOSIS — D696 Thrombocytopenia, unspecified: Secondary | ICD-10-CM

## 2016-01-03 LAB — URINE CULTURE

## 2016-01-03 LAB — BASIC METABOLIC PANEL
ANION GAP: 7 (ref 5–15)
BUN: 20 mg/dL (ref 6–20)
CALCIUM: 8.6 mg/dL — AB (ref 8.9–10.3)
CO2: 24 mmol/L (ref 22–32)
Chloride: 110 mmol/L (ref 101–111)
Creatinine, Ser: 1.58 mg/dL — ABNORMAL HIGH (ref 0.61–1.24)
GFR, EST AFRICAN AMERICAN: 52 mL/min — AB (ref >60)
GFR, EST NON AFRICAN AMERICAN: 45 mL/min — AB (ref >60)
Glucose, Bld: 90 mg/dL (ref 65–99)
POTASSIUM: 4.3 mmol/L (ref 3.5–5.1)
Sodium: 141 mmol/L (ref 135–145)

## 2016-01-03 MED ORDER — CEPHALEXIN 500 MG PO CAPS: 500.0000 mg | ORAL_CAPSULE | Freq: Two times a day (BID) | ORAL | 0 refills | Status: AC

## 2016-01-03 NOTE — Discharge Summary (Signed)
Physician Discharge Summary  Frank Barron BSW:[id number] DOB: December 15, 1950 DOA: 01/01/2016  PCP: [place] date: 01/01/2016 Discharge date: 01/03/2016  Admitted From: Home Disposition:  Home  Recommendations for Outpatient Follow-up:  1. Follow up with PCP in 1 week 2. Repeat BMP in 3-5 days to recheck creatinine 3. Recheck CBC to follow-up thrombocytopenia 4. Patient needs follow-up for pulmonary nodule 5. Patient needs follow-up for adrenal mass   Discharge Condition: Stable CODE STATUS: Full code   Brief/Interim Summary:  HPI written by Dr. Karmen Bongo  HPI: Frank Barron is a 65 y.o. male with medical history significant of CVA with marked expressive aphasia; renal cell CA s/o nephrectomy (remote); HTN; HLD; CHF; CAD; and afib presenting with abdominal pain.  The history is somewhat difficult to interpret due to the patient's expressive aphasia.  He reports that he has had difficulty sleeping, chills since the early weekend.  This AM, 101.7.  Generalized myalgias.  Only 1 kidney - nephrectomy for malignancy in 2015.  +rhinorrhea per wife (patient denies).  Occasional coughing spells.  Mild SOB.  Denies urinary symptoms.   Hospital course:  Severe sepsis secondary to pyelonephritis Pyelonephritis Patient met sepsis criteria on admission (present prior to admission) with tachypnea, leukocytosis and fever. CT scan obtained in ED significant for pyelonephritis of left kidney in addition to urine suggestive of infection. Patient started on ceftriaxone. Blood cultures and urine culture obtained. Urine culture significant for E. Coli susceptible to cephalosporin, so patient was transitioned to Keflex to continue outpatient. Patient was significantly improved before discharge.  AKI Likely secondary to dehydration from sepsis. Improved with IV fluids.  Aphagia, expressive Stable  Atrial fibrillation CHA2DS2-VASc Score is 3. Xarelto  continued  Hypertension Continued home norvasc, hydralazine and metoprolol  Adrenal mass Interval growth seen. Needs close follow-up  Pulmonary nodule Stable from previous scan in may. Patient is a non-smoker. Follow-up outpatient.  Thrombocytopenia Likely secondary to acute infection. No rashes. Follow-up outpatient.  Discharge Diagnoses:  Principal Problem:   Pyelonephritis Active Problems:   Aphasia as late effect of cerebrovascular accident   Hypertension   A-fib ([place])   Adrenal mass, right ([place])   Solitary pulmonary nodule   H/O unilateral nephrectomy   AKI (acute kidney injury) ([place])    Discharge Instructions     Medication List    TAKE these medications   amLODipine 10 MG tablet Commonly known as:  NORVASC Take 5 mg by mouth daily.   ARTHRITIS PAIN RELIEF PO Take 1 tablet by mouth daily as needed. For pain   aspirin 81 MG chewable tablet Chew 81 mg by mouth daily.   cephALEXin 500 MG capsule Commonly known as:  KEFLEX Take 1 capsule (500 mg total) by mouth 2 (two) times daily.   ferrous sulfate 325 (65 FE) MG tablet Take 325 mg by mouth 2 (two) times daily with a meal.   folic acid 1 MG tablet Commonly known as:  FOLVITE Take 1 mg by mouth daily.   gabapentin 300 MG capsule Commonly known as:  NEURONTIN Take 300 mg by mouth 2 (two) times daily.   hydrALAZINE 25 MG tablet Commonly known as:  APRESOLINE Take 1 tablet (25 mg total) by mouth 2 (two) times daily.   metoprolol tartrate 25 MG tablet Commonly known as:  LOPRESSOR Take 12.5 mg by mouth 2 (two) times daily.   Rivaroxaban 15 MG Tabs tablet Commonly known as:  XARELTO Take 15 mg by mouth daily. Take 41m if creatinine  is less than 1.3   senna 8.6 MG tablet Commonly known as:  SENOKOT Take 1 tablet by mouth daily as needed for constipation.   sildenafil 100 MG tablet Commonly known as:  VIAGRA Take 50 mg by mouth daily as needed for erectile dysfunction. Do not exceed 1 tablet  in 24 hours   tamsulosin 0.4 MG Caps capsule Commonly known as:  FLOMAX Take 0.4 mg by mouth daily. For fluid   thiamine 100 MG tablet Commonly known as:  VITAMIN B-1 Take 100 mg by mouth daily.   Vitamin D3 2000 units Tabs Take 1 tablet by mouth 2 (two) times daily.      Follow-up Information    [facility]. Schedule an appointment as soon as possible for a visit in 1 week(s).   Specialty:  General Practice Contact information: [place] [id number] [phone]          No Known Allergies  Consultations:  None   Procedures/Studies: Ct Abdomen Pelvis Wo Contrast  Result Date: 01/01/2016 CLINICAL DATA:  Cough and feels sick. Abdominal pain. History of renal cell carcinoma. EXAM: CT ABDOMEN AND PELVIS WITHOUT CONTRAST TECHNIQUE: Multidetector CT imaging of the abdomen and pelvis was performed following the standard protocol without IV contrast. COMPARISON:  02/02/2014 and 08/12/2015 FINDINGS: Lower chest: Scarring and atelectasis at the right lung base. Mild scarring or atelectasis at the left lung base. No pleural effusions. Small pleural-based nodular density along the left major fissure on sequence 205, image 5. This pleural-based nodule measures 5 mm and was present on study from 08/12/2015. Hepatobiliary: No acute abnormality in the liver or gallbladder. Pancreas: Normal appearance of the pancreas without inflammation or duct dilatation. Spleen: Normal appearance of spleen without enlargement. Adrenals/Urinary Tract: Right kidney has been removed. Again noted is a right adrenal nodule that measures 3.0 x 2.6 cm on sequence 201, image 40 and previously measured 2.4 x 2.2 cm. Hounsfield units measure roughly 27. This nodule appears to be enlarging. Normal appearance of the left adrenal gland. New left perinephric edema. There is also edema around the proximal left ureter and mid left ureter. There is no clear evidence for a stone in the left kidney  or ureter. No significant hydronephrosis. Urinary bladder is decompressed. Unfortunately, evaluation of the urinary bladder is somewhat limited from artifact from the left hip arthroplasty. Stomach/Bowel: Stomach is within normal limits. Appendix appears normal. No evidence of bowel wall thickening, distention, or inflammatory changes. Vascular/Lymphatic: No vascular findings. Small periaortic lymph nodes are nonspecific. Overall, there is not significant abdominal or pelvic lymphadenopathy. Reproductive: Limited evaluation of the prostate and seminal vesicles due to hardware artifact in the pelvis. Other: No significant free fluid. The left perinephric stranding is extending caudal and adjacent to the left psoas muscle. Musculoskeletal: Left hip arthroplasty is located. Again noted is marked sclerosis in the right femoral head. These right femoral head changes may be related to joint space narrowing and degenerative changes. Multilevel facet disease in lower lumbar spine. Disc space disease at L4-L5 and L5-S1. IMPRESSION: New left perinephric edema. There is no significant hydronephrosis and no definite kidney or ureter stones. Findings raise concern for left kidney inflammation such as pyelonephritis. Nonvisualized or recently passed stone cannot be excluded. Please note that evaluation of the bladder is limited due to the artifact from the left hip arthroplasty. Enlargement of the right adrenal nodule. This nodule was previously biopsied but the biopsy was inconclusive. Based on the history of  renal cell carcinoma, metastatic lesion cannot be excluded and patient may benefit from an adrenal MRI for further characterization or repeat biopsy. Volume loss and presumed scarring at the lung bases. Indeterminate pleural-based 5 mm nodule in the left lower lobe. No follow-up needed if patient is low-risk. Non-contrast chest CT can be considered in 12 months if patient is high-risk. This recommendation follows the  consensus statement: Guidelines for Management of Incidental Pulmonary Nodules Detected on CT Images: From the Fleischner Society 2017; Radiology 2017; 284:228-243. Electronically Signed   By: Markus Daft M.D.   On: 01/01/2016 17:39   Dg Chest 2 View  Result Date: 01/01/2016 CLINICAL DATA:  Fever, weakness for 2 days EXAM: CHEST  2 VIEW COMPARISON:  02/02/2014 FINDINGS: Cardiomediastinal silhouette is stable. Mild elevation of the right hemidiaphragm. No infiltrate or pulmonary edema. Mild degenerative changes thoracic spine. IMPRESSION: No active cardiopulmonary disease. Electronically Signed   By: Lahoma Crocker M.D.   On: 01/01/2016 13:44      Subjective: No problems overnight. No pain. No nausea.  Discharge Exam: Vitals:   01/03/16 0523 01/03/16 1041  BP: (!) 127/98 (!) 117/95  Pulse: 92 82  Resp:    Temp:     Vitals:   01/02/16 2227 01/03/16 0521 01/03/16 0523 01/03/16 1041  BP: (!) 120/93 (!) 127/100 (!) 127/98 (!) 117/95  Pulse: 89 88 92 82  Resp: 18 19    Temp: 98.4 F (36.9 C) 98.8 F (37.1 C)    TempSrc:      SpO2: 100% 99% 98% 99%  Weight:      Height:        General exam: Appears calm and comfortable Respiratory system: Clear to auscultation. Respiratory effort normal. Cardiovascular system: S1 & S2 heard, Normal rate with irregular rhythm. No murmurs, rubs, gallops or clicks. Gastrointestinal system: Abdomen is nondistended, soft and nontender. Normal bowel sounds heard. Central nervous system: Alert and oriented. No focal neurological deficits. Extremities: No edema. No calf tenderness Skin: No cyanosis. No rashes Psychiatry: Judgement and insight appear normal. Mood & affect appropriate.    The results of significant diagnostics from this hospitalization (including imaging, microbiology, ancillary and laboratory) are listed below for reference.     Microbiology: Recent Results (from the past 240 hour(s))  Culture, blood (Routine X 2)     Status: None  (Preliminary result)   Collection Time: 01/01/16  2:00 PM  Result Value Ref Range Status   Specimen Description BLOOD LEFT FOREARM  Final   Special Requests BOTTLES DRAWN AEROBIC AND ANAEROBIC  5CC  Final   Culture NO GROWTH 2 DAYS  Final   Report Status PENDING  Incomplete  Culture, blood (Routine X 2)     Status: None (Preliminary result)   Collection Time: 01/01/16  2:02 PM  Result Value Ref Range Status   Specimen Description BLOOD RIGHT FOREARM  Final   Special Requests BOTTLES DRAWN AEROBIC AND ANAEROBIC  3CC  Final   Culture NO GROWTH 2 DAYS  Final   Report Status PENDING  Incomplete  Urine culture     Status: Abnormal   Collection Time: 01/01/16  5:15 PM  Result Value Ref Range Status   Specimen Description URINE, CLEAN CATCH  Final   Special Requests NONE  Final   Culture 20,000 COLONIES/mL ESCHERICHIA COLI (A)  Final   Report Status 01/03/2016 FINAL  Final   Organism ID, Bacteria ESCHERICHIA COLI (A)  Final      Susceptibility   Escherichia coli -  MIC*    AMPICILLIN <=2 SENSITIVE Sensitive     CEFAZOLIN <=4 SENSITIVE Sensitive     CEFTRIAXONE <=1 SENSITIVE Sensitive     CIPROFLOXACIN <=0.25 SENSITIVE Sensitive     GENTAMICIN <=1 SENSITIVE Sensitive     IMIPENEM <=0.25 SENSITIVE Sensitive     NITROFURANTOIN <=16 SENSITIVE Sensitive     TRIMETH/SULFA <=20 SENSITIVE Sensitive     AMPICILLIN/SULBACTAM <=2 SENSITIVE Sensitive     PIP/TAZO <=4 SENSITIVE Sensitive     Extended ESBL NEGATIVE Sensitive     * 20,000 COLONIES/mL ESCHERICHIA COLI     Labs: BNP (last 3 results) No results for input(s): BNP in the last 8760 hours. Basic Metabolic Panel:  Recent Labs Lab 01/01/16 1254 01/01/16 2327 01/03/16 0616  NA 134* 137 141  K 3.4* 5.0 4.3  CL 103 104 110  CO2 20* 26 24  GLUCOSE 107* 122* 90  BUN _0 CREATININE 2.01* 1.96* 1.58*  CALCIUM 9.1 8.7* 8.6*   Liver Function Tests:  Recent Labs Lab 01/01/16 1254  AST 30  ALT 21  ALKPHOS 59  BILITOT 2.1*   PROT 6.9  ALBUMIN 3.2*    Recent Labs Lab 01/01/16 1254  LIPASE 24   No results for input(s): AMMONIA in the last 168 hours. CBC:  Recent Labs Lab 01/01/16 1254 01/01/16 2327  WBC 8.7 9.8  HGB 13.1 12.1*  HCT 38.0* 36.4*  MCV 84.1 84.8  PLT 115* 114*   Cardiac Enzymes: No results for input(s): CKTOTAL, CKMB, CKMBINDEX, TROPONINI in the last 168 hours. BNP: Invalid input(s): POCBNP CBG: No results for input(s): GLUCAP in the last 168 hours. D-Dimer No results for input(s): DDIMER in the last 72 hours. Hgb A1c No results for input(s): HGBA1C in the last 72 hours. Lipid Profile No results for input(s): CHOL, HDL, LDLCALC, TRIG, CHOLHDL, LDLDIRECT in the last 72 hours. Thyroid function studies No results for input(s): TSH, T4TOTAL, T3FREE, THYROIDAB in the last 72 hours.  Invalid input(s): FREET3 Anemia work up No results for input(s): VITAMINB12, FOLATE, FERRITIN, TIBC, IRON, RETICCTPCT in the last 72 hours. Urinalysis    Component Value Date/Time   COLORURINE YELLOW 01/01/2016 1715   APPEARANCEUR HAZY (A) 01/01/2016 1715   LABSPEC 1.009 01/01/2016 1715   PHURINE 5.5 01/01/2016 1715   GLUCOSEU NEGATIVE 01/01/2016 1715   HGBUR MODERATE (A) 01/01/2016 [place] 01/01/2016 1715   KETONESUR NEGATIVE 01/01/2016 1715   PROTEINUR 30 (A) 01/01/2016 1715   UROBILINOGEN 1.0 09/04/2014 1430   NITRITE POSITIVE (A) 01/01/2016 1715   LEUKOCYTESUR LARGE (A) 01/01/2016 1715   Sepsis Labs Invalid input(s): PROCALCITONIN,  WBC,  LACTICIDVEN Microbiology Recent Results (from the past 240 hour(s))  Culture, blood (Routine X 2)     Status: None (Preliminary result)   Collection Time: 01/01/16  2:00 PM  Result Value Ref Range Status   Specimen Description BLOOD LEFT FOREARM  Final   Special Requests BOTTLES DRAWN AEROBIC AND ANAEROBIC  5CC  Final   Culture NO GROWTH 2 DAYS  Final   Report Status PENDING  Incomplete  Culture, blood (Routine X 2)      Status: None (Preliminary result)   Collection Time: 01/01/16  2:02 PM  Result Value Ref Range Status   Specimen Description BLOOD RIGHT FOREARM  Final   Special Requests BOTTLES DRAWN AEROBIC AND ANAEROBIC  3CC  Final   Culture NO GROWTH 2 DAYS  Final   Report Status PENDING  Incomplete  Urine culture  Status: Abnormal   Collection Time: 01/01/16  5:15 PM  Result Value Ref Range Status   Specimen Description URINE, CLEAN CATCH  Final   Special Requests NONE  Final   Culture 20,000 COLONIES/mL ESCHERICHIA COLI (A)  Final   Report Status 01/03/2016 FINAL  Final   Organism ID, Bacteria ESCHERICHIA COLI (A)  Final      Susceptibility   Escherichia coli - MIC*    AMPICILLIN <=2 SENSITIVE Sensitive     CEFAZOLIN <=4 SENSITIVE Sensitive     CEFTRIAXONE <=1 SENSITIVE Sensitive     CIPROFLOXACIN <=0.25 SENSITIVE Sensitive     GENTAMICIN <=1 SENSITIVE Sensitive     IMIPENEM <=0.25 SENSITIVE Sensitive     NITROFURANTOIN <=16 SENSITIVE Sensitive     TRIMETH/SULFA <=20 SENSITIVE Sensitive     AMPICILLIN/SULBACTAM <=2 SENSITIVE Sensitive     PIP/TAZO <=4 SENSITIVE Sensitive     Extended ESBL NEGATIVE Sensitive     * 20,000 COLONIES/mL ESCHERICHIA COLI     Time coordinating discharge: Over 30 minutes  SIGNED:   Cordelia Poche, MD [facility] 01/03/2016, 12:19 PM Pager [phone]  If 7PM-7AM, please contact night-coverage [email or web address] Password [email or web address]

## 2016-01-03 NOTE — Progress Notes (Signed)
Nsg Discharge Note  Admit Date:  01/01/2016 Discharge date: 01/03/2016   Frank Barron to be D/C'd Home per MD order.  AVS completed.  Copy for chart, and copy for patient signed, and dated. Patient/caregiver able to verbalize understanding.  Discharge Medication:   Medication List    TAKE these medications   amLODipine 10 MG tablet Commonly known as:  NORVASC Take 5 mg by mouth daily.   ARTHRITIS PAIN RELIEF PO Take 1 tablet by mouth daily as needed. For pain   aspirin 81 MG chewable tablet Chew 81 mg by mouth daily.   cephALEXin 500 MG capsule Commonly known as:  KEFLEX Take 1 capsule (500 mg total) by mouth 2 (two) times daily.   ferrous sulfate 325 (65 FE) MG tablet Take 325 mg by mouth 2 (two) times daily with a meal.   folic acid 1 MG tablet Commonly known as:  FOLVITE Take 1 mg by mouth daily.   gabapentin 300 MG capsule Commonly known as:  NEURONTIN Take 300 mg by mouth 2 (two) times daily.   hydrALAZINE 25 MG tablet Commonly known as:  APRESOLINE Take 1 tablet (25 mg total) by mouth 2 (two) times daily.   metoprolol tartrate 25 MG tablet Commonly known as:  LOPRESSOR Take 12.5 mg by mouth 2 (two) times daily.   Rivaroxaban 15 MG Tabs tablet Commonly known as:  XARELTO Take 15 mg by mouth daily. Take 20mg  if creatinine is less than 1.3   senna 8.6 MG tablet Commonly known as:  SENOKOT Take 1 tablet by mouth daily as needed for constipation.   sildenafil 100 MG tablet Commonly known as:  VIAGRA Take 50 mg by mouth daily as needed for erectile dysfunction. Do not exceed 1 tablet in 24 hours   tamsulosin 0.4 MG Caps capsule Commonly known as:  FLOMAX Take 0.4 mg by mouth daily. For fluid   thiamine 100 MG tablet Commonly known as:  VITAMIN B-1 Take 100 mg by mouth daily.   Vitamin D3 2000 units Tabs Take 1 tablet by mouth 2 (two) times daily.       Discharge Assessment: Vitals:   01/03/16 0523 01/03/16 1041  BP: (!) 127/98 (!) 117/95   Pulse: 92 82  Resp:    Temp:     Skin clean, dry and intact without evidence of skin break down, no evidence of skin tears noted. IV catheter discontinued intact. Site without signs and symptoms of complications - no redness or edema noted at insertion site, patient denies c/o pain - only slight tenderness at site.  Dressing with slight pressure applied.  D/c Instructions-Education: Discharge instructions given to patient/family with verbalized understanding. D/c education completed with patient/family including follow up instructions, medication list, d/c activities limitations if indicated, with other d/c instructions as indicated by MD - patient able to verbalize understanding, all questions fully answered. Patient instructed to return to ED, call 911, or call MD for any changes in condition.  Patient escorted via [place], and D/C home via private auto.  Salley Slaughter, RN 01/03/2016 12:54 PM  Nsg Discharge Note  Admit Date:  01/01/2016 Discharge date: 01/03/2016   Frank Barron to be D/C'd Home per MD order.  AVS completed.  Copy for chart, and copy for patient signed, and dated. Patient/caregiver able to verbalize understanding.  Discharge Medication:   Medication List    TAKE these medications   amLODipine 10 MG tablet Commonly known as:  NORVASC Take 5 mg by mouth daily.  ARTHRITIS PAIN RELIEF PO Take 1 tablet by mouth daily as needed. For pain   aspirin 81 MG chewable tablet Chew 81 mg by mouth daily.   cephALEXin 500 MG capsule Commonly known as:  KEFLEX Take 1 capsule (500 mg total) by mouth 2 (two) times daily.   ferrous sulfate 325 (65 FE) MG tablet Take 325 mg by mouth 2 (two) times daily with a meal.   folic acid 1 MG tablet Commonly known as:  FOLVITE Take 1 mg by mouth daily.   gabapentin 300 MG capsule Commonly known as:  NEURONTIN Take 300 mg by mouth 2 (two) times daily.   hydrALAZINE 25 MG tablet Commonly known as:  APRESOLINE Take 1 tablet (25  mg total) by mouth 2 (two) times daily.   metoprolol tartrate 25 MG tablet Commonly known as:  LOPRESSOR Take 12.5 mg by mouth 2 (two) times daily.   Rivaroxaban 15 MG Tabs tablet Commonly known as:  XARELTO Take 15 mg by mouth daily. Take 20mg  if creatinine is less than 1.3   senna 8.6 MG tablet Commonly known as:  SENOKOT Take 1 tablet by mouth daily as needed for constipation.   sildenafil 100 MG tablet Commonly known as:  VIAGRA Take 50 mg by mouth daily as needed for erectile dysfunction. Do not exceed 1 tablet in 24 hours   tamsulosin 0.4 MG Caps capsule Commonly known as:  FLOMAX Take 0.4 mg by mouth daily. For fluid   thiamine 100 MG tablet Commonly known as:  VITAMIN B-1 Take 100 mg by mouth daily.   Vitamin D3 2000 units Tabs Take 1 tablet by mouth 2 (two) times daily.       Discharge Assessment: Vitals:   01/03/16 0523 01/03/16 1041  BP: (!) 127/98 (!) 117/95  Pulse: 92 82  Resp:    Temp:     Skin clean, dry and intact without evidence of skin break down, no evidence of skin tears noted. IV catheter discontinued intact. Site without signs and symptoms of complications - no redness or edema noted at insertion site, patient denies c/o pain - only slight tenderness at site.  Dressing with slight pressure applied.  D/c Instructions-Education: Discharge instructions given to patient/family with verbalized understanding. D/c education completed with patient/family including follow up instructions, medication list, d/c activities limitations if indicated, with other d/c instructions as indicated by MD - patient able to verbalize understanding, all questions fully answered. Patient instructed to return to ED, call 911, or call MD for any changes in condition.  Patient escorted via [place], and D/C home via private auto.  Salley Slaughter, RN 01/03/2016 12:54 PM

## 2016-01-04 DIAGNOSIS — D696 Thrombocytopenia, unspecified: Secondary | ICD-10-CM

## 2016-01-04 DIAGNOSIS — R6520 Severe sepsis without septic shock: Secondary | ICD-10-CM

## 2016-01-04 DIAGNOSIS — A419 Sepsis, unspecified organism: Secondary | ICD-10-CM | POA: Insufficient documentation

## 2016-01-06 LAB — CULTURE, BLOOD (ROUTINE X 2)
CULTURE: NO GROWTH
Culture: NO GROWTH

## 2016-08-18 ENCOUNTER — Encounter: Payer: Self-pay | Admitting: Radiation Oncology

## 2016-08-24 ENCOUNTER — Encounter: Payer: Self-pay | Admitting: Radiation Oncology

## 2016-09-06 NOTE — Progress Notes (Signed)
Radiation Oncology         [phone]) [phone] ________________________________  Initial outpatient Consultation  Name: Frank Barron MRN: [id number]  Date: 09/08/2016  DOB: 04-07-1950  LF:YBOFBP, [name]  [name], MD   REFERRING PHYSICIAN: [name], MD  DIAGNOSIS: 66 y.o. gentleman with Stage T1c adenocarcinoma of the prostate with Gleason Score of 3+4, and PSA of 8.7.    ICD-10-CM   1. Malignant neoplasm of prostate [facility]) C61 Ambulatory referral to Social Work     HISTORY OF PRESENT ILLNESS: Frank Barron is a 66 y.o. male with a diagnosis of prostate cancer. He was noted to have an elevated PSA of 10.4 in 01/2016 and 8.7 in 04/2016 by his primary care physician, [name].  Accordingly, he was referred for evaluation in urology by [name] on 04/25/16, and a digital rectal examination was performed at that time revealing no nodules.  MRI of the prostate on 04/22/16 showed central glandular changes of BPH with prostatomegaly and 2 nodules, the larger on the right the level of the mid gland measuring 2.6 cm and involving the right anterior and posterior aspects of the transitional zone and the second nodule measuring 2.3 cm along the left anterior and posterior aspects of the transitional zone at the same level. There was no lymphadenopathy noted in the pelvis. The estimated volume of the prostate was 67 mL.   The patient proceeded to transrectal ultrasound with 12 biopsies of the prostate on 06/24/2016.  Out of 12 core biopsies, 3 were positive.  The maximum Gleason score was 3+4, and this was seen in left lateral base and left lateral mid. Gleason score 3+3 was seen in the left lateral apex.  The patient reviewed the biopsy results with his urologist and he has kindly been referred today for discussion of potential radiation treatment options.  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  PSA 04/2016: 8.7 02/2016: 13.1 01/2016: 10.4 10/2015: 9.2 PREVIOUS RADIATION THERAPY: No  PAST MEDICAL HISTORY:  Past Medical History:  Diagnosis Date  . Anemia   . Anxiety   . Atrial fibrillation ([place])   . CAD (coronary artery disease)    PCI to LAD 2012  . CHF (congestive heart failure) ([place])   . Depression    [name] 07/19/2005 (09/30/2012)  . Hepatitis C    pt denies this hx on 09/30/2012  . High cholesterol   . History of DVT of lower extremity    "left; long time ago" (09/30/2012)  . Hypertension   . Prostate cancer ([place])   . Renal cell carcinoma ([place])   . Stroke ([name]) 11/18/2011   [name] 07/31/2012; expressive aphasia noted on 09/30/2012      PAST SURGICAL HISTORY: Past Surgical History:  Procedure Laterality Date  . COLONOSCOPY N/A 10/01/2012   Procedure: COLONOSCOPY;  Surgeon: Inda Castle, MD;  Location: [facility];  Service: Endoscopy;  Laterality: N/A;  . CORONARY ANGIOPLASTY WITH STENT PLACEMENT    . ESOPHAGOGASTRODUODENOSCOPY N/A 10/01/2012   Procedure: ESOPHAGOGASTRODUODENOSCOPY (EGD);  Surgeon: Inda Castle, MD;  Location: [facility];  Service: Endoscopy;  Laterality: N/A;  . EYE SURGERY Right 1975   /notes 07/19/2005; "injured playing football" (09/29/2012)  . GIVENS CAPSULE STUDY N/A 10/02/2012   Procedure: GIVENS CAPSULE STUDY;  Surgeon: Inda Castle,  MD;  Location: [facility];  Service: Endoscopy;  Laterality: N/A;  . JOINT REPLACEMENT  01/03/2007  . LUMBAR DISC SURGERY  2001   herniated disc/notes 07/19/2005 (09/30/2012)  . NEPHRECTOMY  12/26/2013   Laparascopic , [facility]  . TOTAL HIP ARTHROPLASTY Left 01/03/2007   [name] 01/03/2007 (09/30/2012)  . TOTAL KNEE ARTHROPLASTY Left 09/12/2014   Procedure: LEFT TOTAL KNEE ARTHROPLASTY;  Surgeon: Leandrew Koyanagi, MD;  Location: [facility];  Service: Orthopedics;  Laterality: Left;    FAMILY  HISTORY:  Family History  Problem Relation Age of Onset  . Hypertension Mother   . Deep vein thrombosis Mother   . Cancer Neg Hx     SOCIAL HISTORY:  Social History   Social History  . Marital status: Married    Spouse name: N/A  . Number of children: N/A  . Years of education: N/A   Occupational History  . disabled; volunteers with [employer]    Social History Main Topics  . Smoking status: Never Smoker  . Smokeless tobacco: Never Used  . Alcohol use Yes     Comment: drinks about 1-2 drinks once a week  . Drug use: No     Comment: 09/30/2012 "stopped a long time ago"  . Sexual activity: Yes   Other Topics Concern  . Not on file   Social History Narrative  . No narrative on file    ALLERGIES: Patient has no known allergies.  MEDICATIONS:  Current Outpatient Prescriptions  Medication Sig Dispense Refill  . Acetaminophen (ARTHRITIS PAIN RELIEF PO) Take 1 tablet by mouth daily as needed. For pain    . amLODipine (NORVASC) 10 MG tablet Take 5 mg by mouth daily.    Marland Kitchen aspirin 81 MG chewable tablet Chew 81 mg by mouth.    . Cholecalciferol (VITAMIN D3) 2000 units TABS Take 1 tablet by mouth 2 (two) times daily.    . hydrALAZINE (APRESOLINE) 25 MG tablet Take 1 tablet (25 mg total) by mouth 2 (two) times daily. 60 tablet 0  . metoprolol tartrate (LOPRESSOR) 25 MG tablet Take 12.5 mg by mouth.    . Rivaroxaban (XARELTO) 15 MG TABS tablet Take 15 mg by mouth.    . sildenafil (VIAGRA) 100 MG tablet Take 50 mg by mouth.    . simvastatin (ZOCOR) 20 MG tablet Take 20 mg by mouth.    . tamsulosin (FLOMAX) 0.4 MG CAPS capsule Take 0.4 mg by mouth daily. For fluid     . thiamine (VITAMIN B-1) 100 MG tablet Take 100 mg by mouth daily.    . citalopram (CELEXA) 20 MG tablet Take 10 mg by mouth.    . ferrous sulfate 325 (65 FE) MG tablet Take 325 mg by mouth 2 (two) times daily with a meal.    . folic acid (FOLVITE) 1 MG tablet Take 1 mg by mouth daily.    Marland Kitchen gabapentin (NEURONTIN) 300 MG  capsule Take 300 mg by mouth 2 (two) times daily.    Marland Kitchen oxyCODONE (OXY IR/ROXICODONE) 5 MG immediate release tablet Take 5-10 mg by mouth.    . pantoprazole (PROTONIX) 40 MG tablet Take 40 mg by mouth.    . senna (SENOKOT) 8.6 MG tablet Take 1 tablet by mouth daily as needed for constipation.    . traMADol (ULTRAM) 50 MG tablet Take 50 mg by mouth.     No current facility-administered medications for this encounter.     REVIEW OF SYSTEMS:  On review of systems, the patient  reports that he is doing well overall. He denies any chest pain, shortness of breath, cough, fevers, chills, night sweats, unintended weight changes. He denies any bowel disturbances, nausea or vomiting. He reports pain at four different surgical sights (left knee, back, and two spots in the abdomen from right nephrectomy and right adrenalectomy). His IPSS was 3, indicating mild urinary symptoms.  He denies dysuria, hematuria, or leakage. He has moderate ED, only able to complete sexual activity with less than half of all attempts. He uses Viagra prn.  A complete review of systems is obtained and is otherwise negative.    PHYSICAL EXAM:  Wt Readings from Last 3 Encounters:  09/08/16 218 lb 12.8 oz (99.2 kg)  01/01/16 209 lb 8 oz (95 kg)  10/06/15 219 lb (99.3 kg)   Temp Readings from Last 3 Encounters:  09/08/16 98 F (36.7 C) (Oral)  01/03/16 98.8 F (37.1 C)  10/06/15 98.1 F (36.7 C) (Oral)   BP Readings from Last 3 Encounters:  09/08/16 (!) 137/106  01/03/16 (!) 117/95  10/06/15 (!) 116/97   Pulse Readings from Last 3 Encounters:  09/08/16 71  01/03/16 82  10/06/15 (!) 55   Pain Assessment Pain Score: 0-No pain/10  In general this is a well appearing African-American male in no acute distress. He is alert and oriented x4 and appropriate throughout the examination. Documented expressive aphasia is not appreciated during the encounter today.  His speech is fluent, organized and appropriate.  HEENT reveals  that the patient is normocephalic, atraumatic. EOMs are intact. PERRLA. Skin is intact without any evidence of gross lesions. Cardiovascular exam reveals a regular rate and rhythm, no clicks rubs or murmurs are auscultated. Chest is clear to auscultation bilaterally. Lymphatic assessment is performed and does not reveal any adenopathy in the cervical, supraclavicular, axillary, or inguinal chains. Abdomen has active bowel sounds in all quadrants and is intact. The abdomen is soft, non tender, non distended. Lower extremities are negative for pretibial pitting edema, deep calf tenderness, cyanosis or clubbing.   KPS = 90  100 - Normal; no complaints; no evidence of disease. 90   - Able to carry on normal activity; minor signs or symptoms of disease. 80   - Normal activity with effort; some signs or symptoms of disease. 6   - Cares for self; unable to carry on normal activity or to do active work. 60   - Requires occasional assistance, but is able to care for most of his personal needs. 50   - Requires considerable assistance and frequent medical care. 49   - Disabled; requires special care and assistance. 62   - Severely disabled; hospital admission is indicated although death not imminent. 50   - Very sick; hospital admission necessary; active supportive treatment necessary. 10   - Moribund; fatal processes progressing rapidly. 0     - Dead  Karnofsky DA, Abelmann [place], Craver LS and Burchenal [facility] [phone]) The use of the nitrogen mustards in the palliative treatment of carcinoma: with particular reference to bronchogenic carcinoma Cancer 1 634-56  LABORATORY DATA:  Lab Results  Component Value Date   WBC 9.8 01/01/2016   HGB 12.1 (L) 01/01/2016   HCT 36.4 (L) 01/01/2016   MCV 84.8 01/01/2016   PLT 114 (L) 01/01/2016   Lab Results  Component Value Date   NA 141 01/03/2016   K 4.3 01/03/2016   CL 110 01/03/2016   CO2 24 01/03/2016   Lab Results  Component Value Date  ALT 21 01/01/2016    AST 30 01/01/2016   ALKPHOS 59 01/01/2016   BILITOT 2.1 (H) 01/01/2016     RADIOGRAPHY: No results found.    IMPRESSION/PLAN: 1. 66 y.o. gentleman with Stage T1c adenocarcinoma of the prostate with Gleason Score of 3+4, and PSA of 8.7. We discussed the patient's workup and outlined the nature of prostate cancer in this setting. The patient's T stage, Gleason's score, and PSA put him into the intermediate risk group. Accordingly, he is eligible for a variety of potential treatment options including prostatectomy, brachytherapy, 8 weeks of external radiation. He is not an ideal candidate for prostatectomy due to his multiple co-morbidities.  We discussed the available radiation techniques, and focused on the details and logistics and delivery of IGRT/IMRT. We anticipate an 8 week course of daily radiotherapy.  We discussed and outlined the risks, benefits, short and long-term effects associated with radiotherapy and compared and contrasted these with prostatectomy.  He was encouraged to ask questions which were answered to his satisfaction.  At the end of our conversation, the patient expressed interest in external beam radiotherapy. We will share our findings with [name], and move forward with scheduling prostate IMRT in the near future. He is scheduled for CT Simulation on Friday, 09/17/16.   We enjoyed meeting with him today, and will look forward to participating in the care of this very nice gentleman.    Nicholos Johns, PA-C    Tyler Pita, MD  [facility] Direct Dial: [phone]  Fax: [phone] [email or web address]  Skype  [email or web address]  This document serves as a record of services personally performed by Tyler Pita, MD and Freeman Caldron, PA-C. It was created on their behalf by Rae Lips, a trained medical scribe. The creation of this record is based on the scribe's personal observations and the providers' statements to them. This document has  been checked and approved by the attending providers.

## 2016-09-07 ENCOUNTER — Inpatient Hospital Stay: Admission: RE | Admit: 2016-09-07 | Payer: Self-pay | Source: Ambulatory Visit

## 2016-09-07 ENCOUNTER — Inpatient Hospital Stay
Admission: RE | Admit: 2016-09-07 | Discharge: 2016-09-07 | Disposition: A | Discharge status: Home / Self Care | Payer: Self-pay | Source: Ambulatory Visit | Attending: Radiation Oncology | Admitting: Radiation Oncology

## 2016-09-07 ENCOUNTER — Encounter: Payer: Self-pay | Admitting: Radiation Oncology

## 2016-09-07 ENCOUNTER — Other Ambulatory Visit: Payer: Self-pay | Admitting: Radiation Oncology

## 2016-09-07 ENCOUNTER — Ambulatory Visit
Admission: RE | Admit: 2016-09-07 | Discharge: 2016-09-07 | Disposition: A | Discharge status: Home / Self Care | Payer: Self-pay | Source: Ambulatory Visit | Attending: Radiation Oncology | Admitting: Radiation Oncology

## 2016-09-07 DIAGNOSIS — C801 Malignant (primary) neoplasm, unspecified: Secondary | ICD-10-CM

## 2016-09-07 NOTE — Progress Notes (Signed)
GU Location of Tumor / Histology: prostatic adenocarcinoma  If Prostate Cancer, Gleason Score is (3 + 4) and PSA is (8.7). 67 gram prostate gland.  [name] explains that at a routine physical three months ago he was noted to have an elevated PSA.  Biopsies of prostate (if applicable) revealed: Biopsy was done 06/24/16.      Past/Anticipated interventions by urology, if any: biopsy, referral to Dr Tammi Klippel  Past/Anticipated interventions by medical oncology, if any: no  Weight changes, if any: no  Bowel/Bladder complaints, if any: Reports ED. Denies dysuria or hematuria. Denies leakage or incontinence.    Nausea/Vomiting, if any: no  Pain issues, if any:  Reports pain at four different surgical sites (left knee, back, abdomen x2)  SAFETY ISSUES:  Prior radiation? no  Pacemaker/ICD? no  Possible current pregnancy? no  Is the patient on methotrexate? no  Current Complaints / other details:  66 year old male. 4 mm left lower lobe pulmonary nodule also noted. Right open adrenalectomy done at [facility]. Father died at 39 years old of unknown causes. Mother living with HTN, diabetes, and hx of stroke. Brother well. Sisters (3) living with HTN and diabetes. Divorced with three children. Disabled. 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   Reports his daughter is an NP in [place].

## 2016-09-08 ENCOUNTER — Ambulatory Visit
Admission: RE | Admit: 2016-09-08 | Discharge: 2016-09-08 | Disposition: A | Discharge status: Home / Self Care | Payer: Non-veteran care | Source: Ambulatory Visit | Attending: Radiation Oncology | Admitting: Radiation Oncology

## 2016-09-08 ENCOUNTER — Encounter: Payer: Self-pay | Admitting: Radiation Oncology

## 2016-09-08 VITALS — BP 137/106 | HR 71 | Temp 98.0°F | Resp 16 | Wt 218.8 lb

## 2016-09-08 DIAGNOSIS — F329 Major depressive disorder, single episode, unspecified: Secondary | ICD-10-CM | POA: Diagnosis not present

## 2016-09-08 DIAGNOSIS — C61 Malignant neoplasm of prostate: Secondary | ICD-10-CM

## 2016-09-08 DIAGNOSIS — I2510 Atherosclerotic heart disease of native coronary artery without angina pectoris: Secondary | ICD-10-CM | POA: Insufficient documentation

## 2016-09-08 DIAGNOSIS — N400 Enlarged prostate without lower urinary tract symptoms: Secondary | ICD-10-CM | POA: Diagnosis not present

## 2016-09-08 DIAGNOSIS — Z96652 Presence of left artificial knee joint: Secondary | ICD-10-CM | POA: Diagnosis not present

## 2016-09-08 DIAGNOSIS — Z85528 Personal history of other malignant neoplasm of kidney: Secondary | ICD-10-CM | POA: Insufficient documentation

## 2016-09-08 DIAGNOSIS — Z9889 Other specified postprocedural states: Secondary | ICD-10-CM | POA: Diagnosis not present

## 2016-09-08 DIAGNOSIS — B1920 Unspecified viral hepatitis C without hepatic coma: Secondary | ICD-10-CM | POA: Diagnosis not present

## 2016-09-08 DIAGNOSIS — I509 Heart failure, unspecified: Secondary | ICD-10-CM | POA: Diagnosis not present

## 2016-09-08 DIAGNOSIS — Z79899 Other long term (current) drug therapy: Secondary | ICD-10-CM | POA: Diagnosis not present

## 2016-09-08 DIAGNOSIS — Z96642 Presence of left artificial hip joint: Secondary | ICD-10-CM | POA: Insufficient documentation

## 2016-09-08 DIAGNOSIS — Z86718 Personal history of other venous thrombosis and embolism: Secondary | ICD-10-CM | POA: Insufficient documentation

## 2016-09-08 DIAGNOSIS — Z79891 Long term (current) use of opiate analgesic: Secondary | ICD-10-CM | POA: Insufficient documentation

## 2016-09-08 DIAGNOSIS — E7800 Pure hypercholesterolemia, unspecified: Secondary | ICD-10-CM | POA: Diagnosis not present

## 2016-09-08 DIAGNOSIS — D3500 Benign neoplasm of unspecified adrenal gland: Secondary | ICD-10-CM | POA: Diagnosis not present

## 2016-09-08 DIAGNOSIS — I4891 Unspecified atrial fibrillation: Secondary | ICD-10-CM | POA: Insufficient documentation

## 2016-09-08 DIAGNOSIS — Z7982 Long term (current) use of aspirin: Secondary | ICD-10-CM | POA: Diagnosis not present

## 2016-09-08 DIAGNOSIS — Z510 Encounter for antineoplastic radiation therapy: Secondary | ICD-10-CM | POA: Diagnosis not present

## 2016-09-08 DIAGNOSIS — Z8249 Family history of ischemic heart disease and other diseases of the circulatory system: Secondary | ICD-10-CM | POA: Insufficient documentation

## 2016-09-08 DIAGNOSIS — Z8673 Personal history of transient ischemic attack (TIA), and cerebral infarction without residual deficits: Secondary | ICD-10-CM | POA: Insufficient documentation

## 2016-09-08 DIAGNOSIS — Z8546 Personal history of malignant neoplasm of prostate: Secondary | ICD-10-CM | POA: Insufficient documentation

## 2016-09-08 DIAGNOSIS — I110 Hypertensive heart disease with heart failure: Secondary | ICD-10-CM | POA: Insufficient documentation

## 2016-09-08 DIAGNOSIS — F419 Anxiety disorder, unspecified: Secondary | ICD-10-CM | POA: Diagnosis not present

## 2016-09-08 DIAGNOSIS — Z905 Acquired absence of kidney: Secondary | ICD-10-CM | POA: Diagnosis not present

## 2016-09-08 HISTORY — DX: Malignant neoplasm of prostate: C61

## 2016-09-08 NOTE — Progress Notes (Signed)
See progress note under physician encounter. 

## 2016-09-09 ENCOUNTER — Telehealth: Payer: Self-pay | Admitting: Medical Oncology

## 2016-09-09 NOTE — Telephone Encounter (Signed)
Attempted to reach Frank Barron to introduce myself ast the prostate nurse navigator, but he was not available. I will try again at a later time.

## 2016-09-16 ENCOUNTER — Encounter: Payer: Self-pay | Admitting: *Deleted

## 2016-09-16 NOTE — Progress Notes (Signed)
  Radiation Oncology         [phone] ________________________________  Name: Frank Barron MRN: [id number]  Date: 09/17/2016  DOB: 04-25-1950  SIMULATION AND TREATMENT PLANNING NOTE    ICD-10-CM   1. Malignant neoplasm of prostate ([place]) C61     DIAGNOSIS:  66 y.o. gentleman with Stage T1c adenocarcinoma of the prostate with Gleason Score of 3+4, and PSA of 8.7  NARRATIVE:  The patient was brought to the [facility].  Identity was confirmed.  All relevant records and images related to the planned course of therapy were reviewed.  The patient freely provided informed written consent to proceed with treatment after reviewing the details related to the planned course of therapy. The consent form was witnessed and verified by the simulation staff.  Then, the patient was set-up in a stable reproducible supine position for radiation therapy.  A vacuum lock pillow device was custom fabricated to position his legs in a reproducible immobilized position. His previous MRI has been requested to identify the prostatic apex.  CT images were obtained.  Surface markings were placed.  The CT images were loaded into the planning software.  Then the prostate target and avoidance structures including the rectum, bladder, bowel and hips were contoured.  Treatment planning then occurred.  The radiation prescription was entered and confirmed.  A total of one complex treatment devices was fabricated. I have requested : Intensity Modulated Radiotherapy (IMRT) is medically necessary for this case for the following reason:  Rectal sparing.Marland Kitchen  PLAN:  The patient will receive 78 Gy in 40 fractions.  ________________________________  Sheral Apley Tammi Klippel, M.D.  This document serves as a record of services personally performed by Tyler Pita MD. It was created on his behalf by Delton Coombes, a trained medical scribe. The creation of this record is based on the scribe's personal observations and the  provider's statements to them. This document has been checked and approved by the attending provider.

## 2016-09-16 NOTE — Progress Notes (Signed)
[place] Psychosocial Distress Screening Clinical Social Work  Clinical Social Work was referred by distress screening protocol.  The patient scored a 9 on the Psychosocial Distress Thermometer which indicates severe distress. Clinical Social Worker reviewed chart and attempted to phone pt to assess for distress and other psychosocial needs. CSW left brief message introducing self, explained role of CSW/Pt and Family Support Team, support groups and other resources to assist. CSW encouraged return call and provided contact number.    ONCBCN DISTRESS SCREENING 09/08/2016  Screening Type Initial Screening  Distress experienced in past week (1-10) 9  Practical problem type Housing  Family Problem type Partner  Emotional problem type Adjusting to illness  Spiritual/Religous concerns type Relating to God  Information Concerns Type Lack of info about diagnosis;Lack of info about treatment  Physical Problem type Pain;Sleep/insomnia;Changes in urination;Sexual problems  Physician notified of physical symptoms Yes  Referral to clinical psychology No  Referral to clinical social work Yes  Referral to dietition No  Referral to financial advocate No  Referral to support programs No  Referral to palliative care No    Clinical Social Worker follow up needed: Yes.    If yes, follow up plan: CSW awaits return call.   Loren Racer, LCSW, OSW-C Clinical Social Worker [place]  [place] Phone: [phone] Fax: [phone]

## 2016-09-17 ENCOUNTER — Encounter: Payer: Self-pay | Admitting: Medical Oncology

## 2016-09-17 ENCOUNTER — Ambulatory Visit
Admission: RE | Admit: 2016-09-17 | Discharge: 2016-09-17 | Disposition: A | Discharge status: Home / Self Care | Payer: Non-veteran care | Source: Ambulatory Visit | Attending: Radiation Oncology | Admitting: Radiation Oncology

## 2016-09-17 DIAGNOSIS — C61 Malignant neoplasm of prostate: Secondary | ICD-10-CM

## 2016-09-17 DIAGNOSIS — Z510 Encounter for antineoplastic radiation therapy: Secondary | ICD-10-CM | POA: Diagnosis not present

## 2016-09-21 ENCOUNTER — Encounter: Payer: Self-pay | Admitting: Radiation Oncology

## 2016-09-21 NOTE — Progress Notes (Signed)
Provided Leonard Downing with complete and signed medical release form to use to obtain CD of patient's recent MRI.

## 2016-09-24 DIAGNOSIS — Z510 Encounter for antineoplastic radiation therapy: Secondary | ICD-10-CM | POA: Diagnosis not present

## 2016-09-28 ENCOUNTER — Ambulatory Visit
Admission: RE | Admit: 2016-09-28 | Discharge: 2016-09-28 | Disposition: A | Discharge status: Home / Self Care | Payer: Non-veteran care | Source: Ambulatory Visit | Attending: Radiation Oncology | Admitting: Radiation Oncology

## 2016-09-28 DIAGNOSIS — Z510 Encounter for antineoplastic radiation therapy: Secondary | ICD-10-CM | POA: Diagnosis not present

## 2016-09-29 ENCOUNTER — Ambulatory Visit
Admission: RE | Admit: 2016-09-29 | Discharge: 2016-09-29 | Disposition: A | Discharge status: Home / Self Care | Payer: Non-veteran care | Source: Ambulatory Visit | Attending: Radiation Oncology | Admitting: Radiation Oncology

## 2016-09-29 DIAGNOSIS — Z510 Encounter for antineoplastic radiation therapy: Secondary | ICD-10-CM | POA: Diagnosis not present

## 2016-09-30 ENCOUNTER — Ambulatory Visit
Admission: RE | Admit: 2016-09-30 | Discharge: 2016-09-30 | Disposition: A | Discharge status: Home / Self Care | Payer: Non-veteran care | Source: Ambulatory Visit | Attending: Radiation Oncology | Admitting: Radiation Oncology

## 2016-09-30 DIAGNOSIS — Z510 Encounter for antineoplastic radiation therapy: Secondary | ICD-10-CM | POA: Diagnosis not present

## 2016-10-01 ENCOUNTER — Ambulatory Visit
Admission: RE | Admit: 2016-10-01 | Discharge: 2016-10-01 | Disposition: A | Discharge status: Home / Self Care | Payer: Non-veteran care | Source: Ambulatory Visit | Attending: Radiation Oncology | Admitting: Radiation Oncology

## 2016-10-01 DIAGNOSIS — Z510 Encounter for antineoplastic radiation therapy: Secondary | ICD-10-CM | POA: Diagnosis not present

## 2016-10-04 ENCOUNTER — Ambulatory Visit
Admission: RE | Admit: 2016-10-04 | Discharge: 2016-10-04 | Disposition: A | Discharge status: Home / Self Care | Payer: Non-veteran care | Source: Ambulatory Visit | Attending: Radiation Oncology | Admitting: Radiation Oncology

## 2016-10-04 DIAGNOSIS — Z510 Encounter for antineoplastic radiation therapy: Secondary | ICD-10-CM | POA: Diagnosis not present

## 2016-10-05 ENCOUNTER — Ambulatory Visit
Admission: RE | Admit: 2016-10-05 | Discharge: 2016-10-05 | Disposition: A | Discharge status: Home / Self Care | Payer: Non-veteran care | Source: Ambulatory Visit | Attending: Radiation Oncology | Admitting: Radiation Oncology

## 2016-10-05 DIAGNOSIS — Z510 Encounter for antineoplastic radiation therapy: Secondary | ICD-10-CM | POA: Diagnosis not present

## 2016-10-06 ENCOUNTER — Ambulatory Visit
Admission: RE | Admit: 2016-10-06 | Discharge: 2016-10-06 | Disposition: A | Discharge status: Home / Self Care | Payer: Non-veteran care | Source: Ambulatory Visit | Attending: Radiation Oncology | Admitting: Radiation Oncology

## 2016-10-06 DIAGNOSIS — Z510 Encounter for antineoplastic radiation therapy: Secondary | ICD-10-CM | POA: Diagnosis not present

## 2016-10-07 ENCOUNTER — Ambulatory Visit
Admission: RE | Admit: 2016-10-07 | Discharge: 2016-10-07 | Disposition: A | Discharge status: Home / Self Care | Payer: Non-veteran care | Source: Ambulatory Visit | Attending: Radiation Oncology | Admitting: Radiation Oncology

## 2016-10-07 DIAGNOSIS — Z510 Encounter for antineoplastic radiation therapy: Secondary | ICD-10-CM | POA: Diagnosis not present

## 2016-10-08 ENCOUNTER — Ambulatory Visit
Admission: RE | Admit: 2016-10-08 | Discharge: 2016-10-08 | Disposition: A | Discharge status: Home / Self Care | Payer: Non-veteran care | Source: Ambulatory Visit | Attending: Radiation Oncology | Admitting: Radiation Oncology

## 2016-10-08 DIAGNOSIS — Z510 Encounter for antineoplastic radiation therapy: Secondary | ICD-10-CM | POA: Diagnosis not present

## 2016-10-11 ENCOUNTER — Ambulatory Visit
Admission: RE | Admit: 2016-10-11 | Discharge: 2016-10-11 | Disposition: A | Discharge status: Home / Self Care | Payer: Non-veteran care | Source: Ambulatory Visit | Attending: Radiation Oncology | Admitting: Radiation Oncology

## 2016-10-11 DIAGNOSIS — Z510 Encounter for antineoplastic radiation therapy: Secondary | ICD-10-CM | POA: Diagnosis not present

## 2016-10-12 ENCOUNTER — Ambulatory Visit
Admission: RE | Admit: 2016-10-12 | Discharge: 2016-10-12 | Disposition: A | Discharge status: Home / Self Care | Payer: Non-veteran care | Source: Ambulatory Visit | Attending: Radiation Oncology | Admitting: Radiation Oncology

## 2016-10-12 DIAGNOSIS — Z510 Encounter for antineoplastic radiation therapy: Secondary | ICD-10-CM | POA: Diagnosis not present

## 2016-10-13 ENCOUNTER — Ambulatory Visit
Admission: RE | Admit: 2016-10-13 | Discharge: 2016-10-13 | Disposition: A | Discharge status: Home / Self Care | Payer: Non-veteran care | Source: Ambulatory Visit | Attending: Radiation Oncology | Admitting: Radiation Oncology

## 2016-10-13 DIAGNOSIS — Z510 Encounter for antineoplastic radiation therapy: Secondary | ICD-10-CM | POA: Diagnosis not present

## 2016-10-14 ENCOUNTER — Encounter: Payer: Self-pay | Admitting: Radiation Oncology

## 2016-10-14 ENCOUNTER — Ambulatory Visit
Admission: RE | Admit: 2016-10-14 | Discharge: 2016-10-14 | Disposition: A | Discharge status: Home / Self Care | Payer: Non-veteran care | Source: Ambulatory Visit | Attending: Radiation Oncology | Admitting: Radiation Oncology

## 2016-10-14 DIAGNOSIS — Z510 Encounter for antineoplastic radiation therapy: Secondary | ICD-10-CM | POA: Diagnosis not present

## 2016-10-15 ENCOUNTER — Ambulatory Visit: Payer: Non-veteran care

## 2016-10-18 ENCOUNTER — Ambulatory Visit
Admission: RE | Admit: 2016-10-18 | Discharge: 2016-10-18 | Disposition: A | Discharge status: Home / Self Care | Payer: Non-veteran care | Source: Ambulatory Visit | Attending: Radiation Oncology | Admitting: Radiation Oncology

## 2016-10-18 DIAGNOSIS — Z510 Encounter for antineoplastic radiation therapy: Secondary | ICD-10-CM | POA: Diagnosis not present

## 2016-10-19 ENCOUNTER — Ambulatory Visit
Admission: RE | Admit: 2016-10-19 | Discharge: 2016-10-19 | Disposition: A | Discharge status: Home / Self Care | Payer: Non-veteran care | Source: Ambulatory Visit | Attending: Radiation Oncology | Admitting: Radiation Oncology

## 2016-10-19 DIAGNOSIS — Z510 Encounter for antineoplastic radiation therapy: Secondary | ICD-10-CM | POA: Diagnosis not present

## 2016-10-20 ENCOUNTER — Ambulatory Visit
Admission: RE | Admit: 2016-10-20 | Discharge: 2016-10-20 | Disposition: A | Discharge status: Home / Self Care | Payer: Non-veteran care | Source: Ambulatory Visit | Attending: Radiation Oncology | Admitting: Radiation Oncology

## 2016-10-20 DIAGNOSIS — Z510 Encounter for antineoplastic radiation therapy: Secondary | ICD-10-CM | POA: Diagnosis not present

## 2016-10-21 ENCOUNTER — Encounter: Payer: Self-pay | Admitting: Medical Oncology

## 2016-10-21 ENCOUNTER — Ambulatory Visit
Admission: RE | Admit: 2016-10-21 | Discharge: 2016-10-21 | Disposition: A | Discharge status: Home / Self Care | Payer: Non-veteran care | Source: Ambulatory Visit | Attending: Radiation Oncology | Admitting: Radiation Oncology

## 2016-10-21 DIAGNOSIS — Z510 Encounter for antineoplastic radiation therapy: Secondary | ICD-10-CM | POA: Diagnosis not present

## 2016-10-22 ENCOUNTER — Ambulatory Visit
Admission: RE | Admit: 2016-10-22 | Discharge: 2016-10-22 | Disposition: A | Discharge status: Home / Self Care | Payer: Non-veteran care | Source: Ambulatory Visit | Attending: Radiation Oncology | Admitting: Radiation Oncology

## 2016-10-22 ENCOUNTER — Encounter: Payer: Self-pay | Admitting: Radiation Oncology

## 2016-10-22 DIAGNOSIS — Z510 Encounter for antineoplastic radiation therapy: Secondary | ICD-10-CM | POA: Diagnosis not present

## 2016-10-25 ENCOUNTER — Ambulatory Visit
Admission: RE | Admit: 2016-10-25 | Discharge: 2016-10-25 | Disposition: A | Discharge status: Home / Self Care | Payer: Non-veteran care | Source: Ambulatory Visit | Attending: Radiation Oncology | Admitting: Radiation Oncology

## 2016-10-25 DIAGNOSIS — Z510 Encounter for antineoplastic radiation therapy: Secondary | ICD-10-CM | POA: Diagnosis not present

## 2016-10-26 ENCOUNTER — Ambulatory Visit
Admission: RE | Admit: 2016-10-26 | Discharge: 2016-10-26 | Disposition: A | Discharge status: Home / Self Care | Payer: Non-veteran care | Source: Ambulatory Visit | Attending: Radiation Oncology | Admitting: Radiation Oncology

## 2016-10-26 DIAGNOSIS — Z510 Encounter for antineoplastic radiation therapy: Secondary | ICD-10-CM | POA: Diagnosis not present

## 2016-10-27 ENCOUNTER — Ambulatory Visit
Admission: RE | Admit: 2016-10-27 | Discharge: 2016-10-27 | Disposition: A | Discharge status: Home / Self Care | Payer: Non-veteran care | Source: Ambulatory Visit | Attending: Radiation Oncology | Admitting: Radiation Oncology

## 2016-10-27 DIAGNOSIS — Z510 Encounter for antineoplastic radiation therapy: Secondary | ICD-10-CM | POA: Diagnosis not present

## 2016-10-28 ENCOUNTER — Ambulatory Visit
Admission: RE | Admit: 2016-10-28 | Discharge: 2016-10-28 | Disposition: A | Discharge status: Home / Self Care | Payer: Non-veteran care | Source: Ambulatory Visit | Attending: Radiation Oncology | Admitting: Radiation Oncology

## 2016-10-28 DIAGNOSIS — Z510 Encounter for antineoplastic radiation therapy: Secondary | ICD-10-CM | POA: Diagnosis not present

## 2016-10-29 ENCOUNTER — Encounter: Payer: Self-pay | Admitting: Radiation Oncology

## 2016-10-29 ENCOUNTER — Ambulatory Visit
Admission: RE | Admit: 2016-10-29 | Discharge: 2016-10-29 | Disposition: A | Discharge status: Home / Self Care | Payer: Non-veteran care | Source: Ambulatory Visit | Attending: Radiation Oncology | Admitting: Radiation Oncology

## 2016-10-29 DIAGNOSIS — Z510 Encounter for antineoplastic radiation therapy: Secondary | ICD-10-CM | POA: Diagnosis not present

## 2016-11-01 ENCOUNTER — Ambulatory Visit
Admission: RE | Admit: 2016-11-01 | Discharge: 2016-11-01 | Disposition: A | Discharge status: Home / Self Care | Payer: Non-veteran care | Source: Ambulatory Visit | Attending: Radiation Oncology | Admitting: Radiation Oncology

## 2016-11-01 DIAGNOSIS — Z510 Encounter for antineoplastic radiation therapy: Secondary | ICD-10-CM | POA: Diagnosis not present

## 2016-11-02 ENCOUNTER — Ambulatory Visit
Admission: RE | Admit: 2016-11-02 | Discharge: 2016-11-02 | Disposition: A | Discharge status: Home / Self Care | Payer: Non-veteran care | Source: Ambulatory Visit | Attending: Radiation Oncology | Admitting: Radiation Oncology

## 2016-11-02 DIAGNOSIS — Z510 Encounter for antineoplastic radiation therapy: Secondary | ICD-10-CM | POA: Diagnosis not present

## 2016-11-03 ENCOUNTER — Ambulatory Visit
Admission: RE | Admit: 2016-11-03 | Discharge: 2016-11-03 | Disposition: A | Discharge status: Home / Self Care | Payer: Non-veteran care | Source: Ambulatory Visit | Attending: Radiation Oncology | Admitting: Radiation Oncology

## 2016-11-03 DIAGNOSIS — Z510 Encounter for antineoplastic radiation therapy: Secondary | ICD-10-CM | POA: Diagnosis not present

## 2016-11-04 ENCOUNTER — Ambulatory Visit
Admission: RE | Admit: 2016-11-04 | Discharge: 2016-11-04 | Disposition: A | Discharge status: Home / Self Care | Payer: Non-veteran care | Source: Ambulatory Visit | Attending: Radiation Oncology | Admitting: Radiation Oncology

## 2016-11-04 DIAGNOSIS — Z510 Encounter for antineoplastic radiation therapy: Secondary | ICD-10-CM | POA: Diagnosis not present

## 2016-11-05 ENCOUNTER — Ambulatory Visit
Admission: RE | Admit: 2016-11-05 | Discharge: 2016-11-05 | Disposition: A | Discharge status: Home / Self Care | Payer: Non-veteran care | Source: Ambulatory Visit | Attending: Radiation Oncology | Admitting: Radiation Oncology

## 2016-11-05 ENCOUNTER — Encounter: Payer: Self-pay | Admitting: Radiation Oncology

## 2016-11-05 DIAGNOSIS — Z510 Encounter for antineoplastic radiation therapy: Secondary | ICD-10-CM | POA: Diagnosis not present

## 2016-11-08 ENCOUNTER — Ambulatory Visit: Admission: RE | Admit: 2016-11-08 | Payer: Non-veteran care | Source: Ambulatory Visit

## 2016-11-09 ENCOUNTER — Ambulatory Visit
Admission: RE | Admit: 2016-11-09 | Discharge: 2016-11-09 | Disposition: A | Discharge status: Home / Self Care | Payer: Non-veteran care | Source: Ambulatory Visit | Attending: Radiation Oncology | Admitting: Radiation Oncology

## 2016-11-09 DIAGNOSIS — Z510 Encounter for antineoplastic radiation therapy: Secondary | ICD-10-CM | POA: Diagnosis not present

## 2016-11-10 ENCOUNTER — Ambulatory Visit
Admission: RE | Admit: 2016-11-10 | Discharge: 2016-11-10 | Disposition: A | Discharge status: Home / Self Care | Payer: Non-veteran care | Source: Ambulatory Visit | Attending: Radiation Oncology | Admitting: Radiation Oncology

## 2016-11-10 DIAGNOSIS — Z510 Encounter for antineoplastic radiation therapy: Secondary | ICD-10-CM | POA: Diagnosis not present

## 2016-11-11 ENCOUNTER — Ambulatory Visit
Admission: RE | Admit: 2016-11-11 | Discharge: 2016-11-11 | Disposition: A | Discharge status: Home / Self Care | Payer: Non-veteran care | Source: Ambulatory Visit | Attending: Radiation Oncology | Admitting: Radiation Oncology

## 2016-11-11 DIAGNOSIS — Z510 Encounter for antineoplastic radiation therapy: Secondary | ICD-10-CM | POA: Diagnosis not present

## 2016-11-12 ENCOUNTER — Ambulatory Visit
Admission: RE | Admit: 2016-11-12 | Discharge: 2016-11-12 | Disposition: A | Discharge status: Home / Self Care | Payer: Non-veteran care | Source: Ambulatory Visit | Attending: Radiation Oncology | Admitting: Radiation Oncology

## 2016-11-12 ENCOUNTER — Encounter: Payer: Self-pay | Admitting: Radiation Oncology

## 2016-11-12 DIAGNOSIS — Z510 Encounter for antineoplastic radiation therapy: Secondary | ICD-10-CM | POA: Diagnosis not present

## 2016-11-16 ENCOUNTER — Telehealth: Payer: Self-pay | Admitting: Radiation Oncology

## 2016-11-16 ENCOUNTER — Ambulatory Visit
Admission: RE | Admit: 2016-11-16 | Discharge: 2016-11-16 | Disposition: A | Discharge status: Home / Self Care | Payer: Non-veteran care | Source: Ambulatory Visit | Attending: Radiation Oncology | Admitting: Radiation Oncology

## 2016-11-16 DIAGNOSIS — Z510 Encounter for antineoplastic radiation therapy: Secondary | ICD-10-CM | POA: Diagnosis not present

## 2016-11-16 NOTE — Telephone Encounter (Signed)
Received voicemail message from patient stating that he is leaving his appointment now at the [place] headed this way but, will be late for his 56 radiation appointment. Informed Melissa, RT on L4 of this finding.

## 2016-11-17 ENCOUNTER — Ambulatory Visit
Admission: RE | Admit: 2016-11-17 | Discharge: 2016-11-17 | Disposition: A | Discharge status: Home / Self Care | Payer: Non-veteran care | Source: Ambulatory Visit | Attending: Radiation Oncology | Admitting: Radiation Oncology

## 2016-11-17 DIAGNOSIS — Z510 Encounter for antineoplastic radiation therapy: Secondary | ICD-10-CM | POA: Diagnosis not present

## 2016-11-18 ENCOUNTER — Ambulatory Visit
Admission: RE | Admit: 2016-11-18 | Discharge: 2016-11-18 | Disposition: A | Discharge status: Home / Self Care | Payer: Non-veteran care | Source: Ambulatory Visit | Attending: Radiation Oncology | Admitting: Radiation Oncology

## 2016-11-18 DIAGNOSIS — Z510 Encounter for antineoplastic radiation therapy: Secondary | ICD-10-CM | POA: Diagnosis not present

## 2016-11-19 ENCOUNTER — Ambulatory Visit
Admission: RE | Admit: 2016-11-19 | Discharge: 2016-11-19 | Disposition: A | Discharge status: Home / Self Care | Payer: Non-veteran care | Source: Ambulatory Visit | Attending: Radiation Oncology | Admitting: Radiation Oncology

## 2016-11-19 ENCOUNTER — Encounter: Payer: Self-pay | Admitting: Radiation Oncology

## 2016-11-19 DIAGNOSIS — Z510 Encounter for antineoplastic radiation therapy: Secondary | ICD-10-CM | POA: Diagnosis not present

## 2016-11-22 ENCOUNTER — Ambulatory Visit
Admission: RE | Admit: 2016-11-22 | Discharge: 2016-11-22 | Disposition: A | Discharge status: Home / Self Care | Payer: Non-veteran care | Source: Ambulatory Visit | Attending: Radiation Oncology | Admitting: Radiation Oncology

## 2016-11-22 DIAGNOSIS — Z510 Encounter for antineoplastic radiation therapy: Secondary | ICD-10-CM | POA: Diagnosis not present

## 2016-11-23 ENCOUNTER — Ambulatory Visit: Payer: Non-veteran care

## 2016-11-23 ENCOUNTER — Ambulatory Visit
Admission: RE | Admit: 2016-11-23 | Discharge: 2016-11-23 | Disposition: A | Discharge status: Home / Self Care | Payer: Non-veteran care | Source: Ambulatory Visit | Attending: Radiation Oncology | Admitting: Radiation Oncology

## 2016-11-23 DIAGNOSIS — Z510 Encounter for antineoplastic radiation therapy: Secondary | ICD-10-CM | POA: Diagnosis not present

## 2016-11-24 ENCOUNTER — Ambulatory Visit: Payer: Non-veteran care

## 2016-11-24 ENCOUNTER — Ambulatory Visit
Admission: RE | Admit: 2016-11-24 | Discharge: 2016-11-24 | Disposition: A | Discharge status: Home / Self Care | Payer: Non-veteran care | Source: Ambulatory Visit | Attending: Radiation Oncology | Admitting: Radiation Oncology

## 2016-11-24 DIAGNOSIS — Z510 Encounter for antineoplastic radiation therapy: Secondary | ICD-10-CM | POA: Diagnosis not present

## 2016-11-25 ENCOUNTER — Encounter: Payer: Self-pay | Admitting: Radiation Oncology

## 2016-11-25 ENCOUNTER — Ambulatory Visit
Admission: RE | Admit: 2016-11-25 | Discharge: 2016-11-25 | Disposition: A | Discharge status: Home / Self Care | Payer: Non-veteran care | Source: Ambulatory Visit | Attending: Radiation Oncology | Admitting: Radiation Oncology

## 2016-11-25 DIAGNOSIS — Z510 Encounter for antineoplastic radiation therapy: Secondary | ICD-10-CM | POA: Diagnosis not present

## 2016-12-01 NOTE — Progress Notes (Signed)
°  Radiation Oncology         [phone] ________________________________  Name: Frank Barron MRN: [id number]  Date: 11/25/2016  DOB: [birth year]  End of Treatment Note  Diagnosis:   66 y.o. gentleman with Stage T1cadenocarcinoma of the prostate with Gleason Score of 3+4, and PSA of 8.7     Indication for treatment:  Curative, Definitive Radiotherapy       Radiation treatment dates:   09/28/2016 to 11/25/2016  Site/dose:   The prostate was treated to 78 Gy in 40 fractions of 1.95 Gy  Beams/energy:   The patient was treated with IMRT using volumetric arc therapy delivering 6 MV X-rays to clockwise and counterclockwise circumferential arcs with a 90 degree collimator offset to avoid dose scalloping.  Image guidance was performed with daily cone beam CT prior to each fraction to align to gold markers in the prostate and assure proper bladder and rectal fill volumes.  Immobilization was achieved with BodyFix custom mold.  Narrative: The patient tolerated radiation treatment relatively well.   He experienced modest fatigue and some minor urinary irritation including nocturia x 3. He denied abdominal pain or diarrhea.  He denied gross hematuria, dysuria, daytime frequency, urgency, weak stream, hesitancy, incomplete emptying or incontinence.    Plan: The patient has completed radiation treatment. He will return to radiation oncology clinic for routine followup in one month. I advised him to call or return sooner if he has any questions or concerns related to his recovery or treatment. ________________________________  Sheral Apley. Tammi Klippel, M.D.  This document serves as a record of services personally performed by Tyler Pita, MD. It was created on his behalf by Arlyce Harman, a trained medical scribe. The creation of this record is based on the scribe's personal observations and the provider's statements to them. This document has been checked and approved by the attending provider.

## 2017-01-04 ENCOUNTER — Inpatient Hospital Stay
Admission: RE | Admit: 2017-01-04 | Discharge: 2017-01-04 | Disposition: A | Discharge status: Home / Self Care | Payer: Non-veteran care | Source: Ambulatory Visit | Attending: Urology | Admitting: Urology

## 2017-01-04 ENCOUNTER — Encounter: Payer: Self-pay | Admitting: Urology

## 2017-01-04 VITALS — BP 136/94 | HR 101 | Temp 97.8°F | Resp 20

## 2017-01-04 DIAGNOSIS — C61 Malignant neoplasm of prostate: Secondary | ICD-10-CM

## 2017-01-04 NOTE — Progress Notes (Signed)
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 He tolerated radiation treatment relatively well. He experienced modest fatigue and some minor urinary irritation including nocturia x 3. He denied abdominal pain or diarrhea.  He denied gross hematuria, dysuria, daytime frequency, urgency, weak stream, hesitancy, incomplete emptying or incontinence.                                On review of systems, the patient states that he is doing very well overall. He reports complete resolution of dysuria and denies having increased urgency, frequency, hesitation, weak stream, gross hematuria, incontinence or excessive nocturia. He reports nocturia times 2 at night only which is his baseline. He reports a healthy appetite and is maintaining his weight. He denies abdominal pain, nausea, vomiting or diarrhea. He reports a good energy level and has been able to resume his routine daily activities.  His only concern today was some chronic bilateral hip pain and discomfort in his left knee. He has pain medication which he uses with relief.  ALLERGIES:  has No Known Allergies.  Meds: Current Outpatient Prescriptions  Medication Sig Dispense Refill  . Acetaminophen (ARTHRITIS PAIN RELIEF PO) Take 1 tablet by mouth daily as needed. For pain    . amLODipine (NORVASC) 10 MG tablet Take 5 mg by mouth daily.    Marland Kitchen aspirin 81 MG chewable tablet Chew 81 mg by mouth.    . Cholecalciferol (VITAMIN D3) 2000 units TABS Take 1 tablet by mouth 2  (two) times daily.    . citalopram (CELEXA) 20 MG tablet Take 10 mg by mouth.    . ferrous sulfate 325 (65 FE) MG tablet Take 325 mg by mouth 2 (two) times daily with a meal.    . gabapentin (NEURONTIN) 300 MG capsule Take 300 mg by mouth 2 (two) times daily.    . hydrALAZINE (APRESOLINE) 25 MG tablet Take 1 tablet (25 mg total) by mouth 2 (two) times daily. 60 tablet 0  . metoprolol tartrate (LOPRESSOR) 25 MG tablet Take 12.5 mg by mouth.    . Rivaroxaban (XARELTO) 15 MG TABS tablet Take 15 mg by mouth.    . sildenafil (VIAGRA) 100 MG tablet Take 50 mg by mouth.    . thiamine (VITAMIN B-1) 100 MG tablet Take 100 mg by mouth daily.    . folic acid (FOLVITE) 1 MG tablet Take 1 mg by mouth daily.    Marland Kitchen oxyCODONE (OXY IR/ROXICODONE) 5 MG immediate release tablet Take 5-10 mg by mouth.    . pantoprazole (PROTONIX) 40 MG tablet Take 40 mg by mouth.    . senna (SENOKOT) 8.6 MG tablet Take 1 tablet by mouth daily as needed for constipation.    . simvastatin (ZOCOR) 20 MG tablet Take 20 mg by mouth.    . tamsulosin (FLOMAX) 0.4 MG CAPS capsule Take 0.4 mg by mouth daily. For fluid     . traMADol Veatrice Bourbon)  50 MG tablet Take 50 mg by mouth.     No current facility-administered medications for this encounter.     Physical Findings:  oral temperature is 97.8 F (36.6 C). His blood pressure is 136/94 (abnormal) and his pulse is 101 (abnormal). His respiration is 20 and oxygen saturation is 100%.  Pain Assessment Pain Score: 10-Worst pain ever Pain Loc: Hip/10 In general this is a well appearing African-American male in no acute distress. He's alert and oriented x4 and appropriate throughout the examination. Cardiopulmonary assessment is negative for acute distress and he exhibits normal effort.   Lab Findings: Lab Results  Component Value Date   WBC 9.8 01/01/2016   HGB 12.1 (L) 01/01/2016   HCT 36.4 (L) 01/01/2016   MCV 84.8 01/01/2016   PLT 114 (L) 01/01/2016     Radiographic Findings: No  results found.  Impression/Plan: 1. 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  He will continue to follow up with urology for ongoing PSA determinations with [name] at the [facility]. He understands what to expect with regards to PSA monitoring going forward. I will look forward to following his response to treatment via correspondence with urology, and would be happy to continue to participate in his care if clinically indicated. I talked to the patient about what to expect in the future, including his risk for erectile dysfunction and rectal bleeding. I encouraged him to call or return to the office if he has any questions regarding his previous radiation or possible radiation side effects. He was comfortable with this plan and will follow up as needed.    Nicholos Johns, PA-C

## 2017-01-05 NOTE — Addendum Note (Signed)
Encounter addended by: Malena Edman, RN on: 01/05/2017  8:24 AM<BR>    Actions taken: Charge Capture section accepted

## 2017-02-09 IMAGING — CT CT BIOPSY
2 of 6 series · 13 of 32 positions shown, 18 images · non-contrast
Comparison: none

INDICATION: 64-year-old with an enlarging right adrenal lesion. History of right
nephrectomy for renal cell carcinoma.

[Series 2: i-spiral 5.0 b40f · axial · 0.89mm/px · z∈[+1258,+1387]mm · 8 of 49 slices shown, 13 images (1 of 2)]
[im 6/49  soft-tissue]
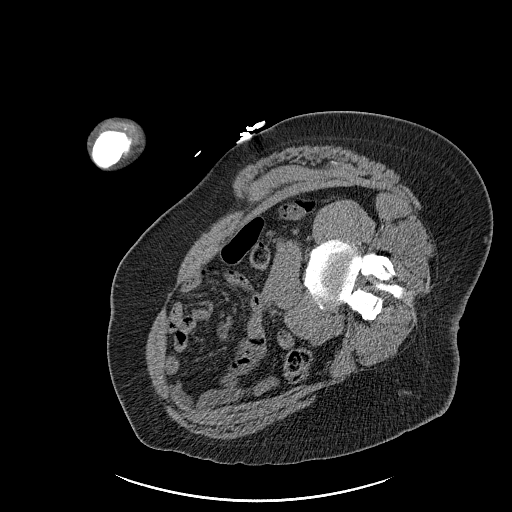
[im 6/49  bone]
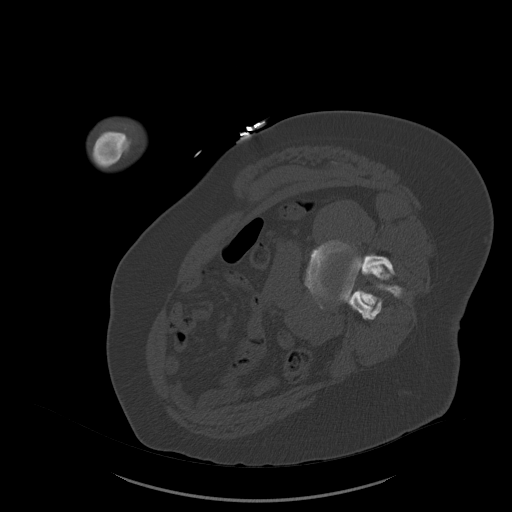
[im 11/49  soft-tissue]
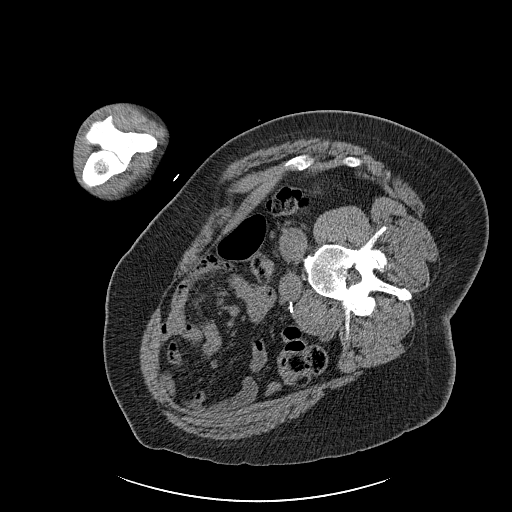
[im 17/49  soft-tissue]
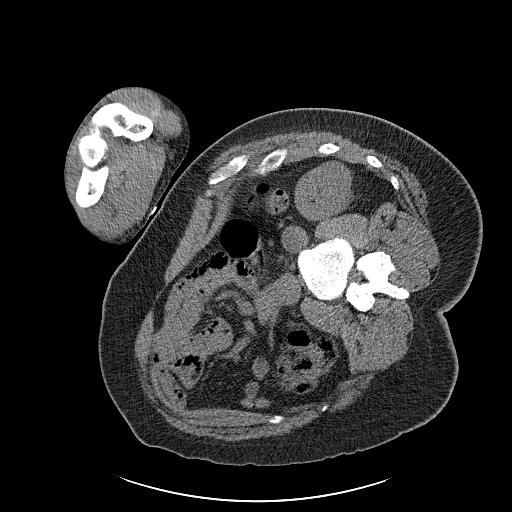
[im 22/49  soft-tissue]
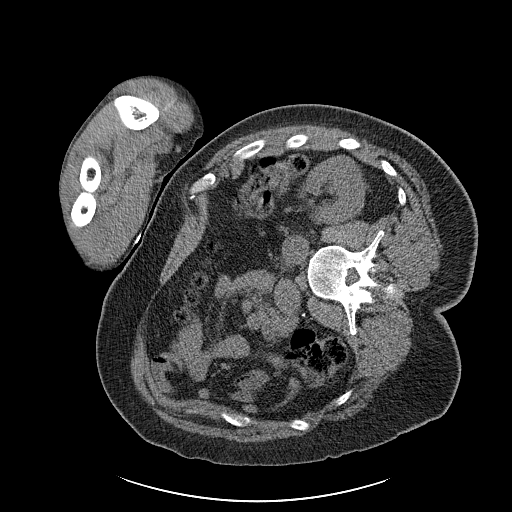
[im 27/49  soft-tissue]
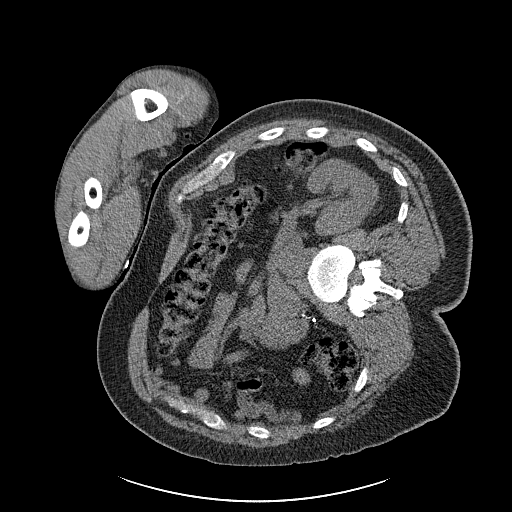
[im 27/49  lung]
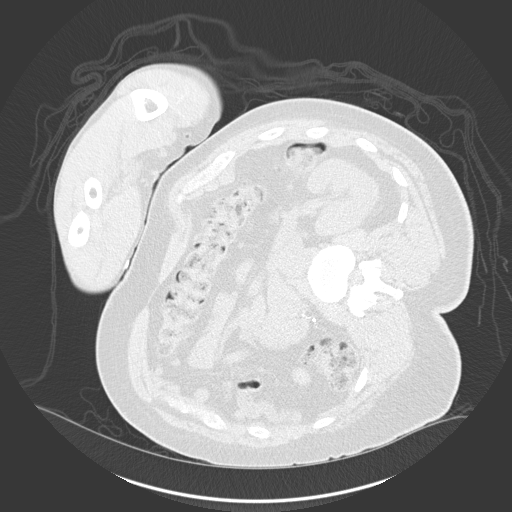
[im 33/49  soft-tissue]
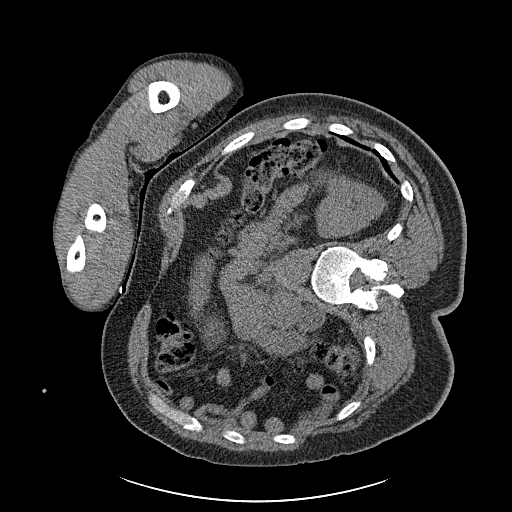
[im 33/49  lung]
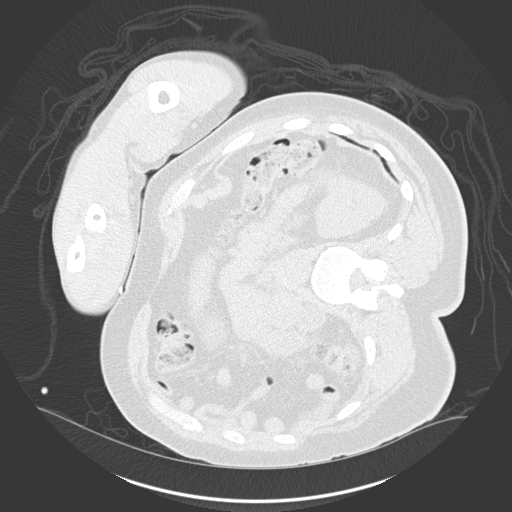
[im 38/49  soft-tissue]
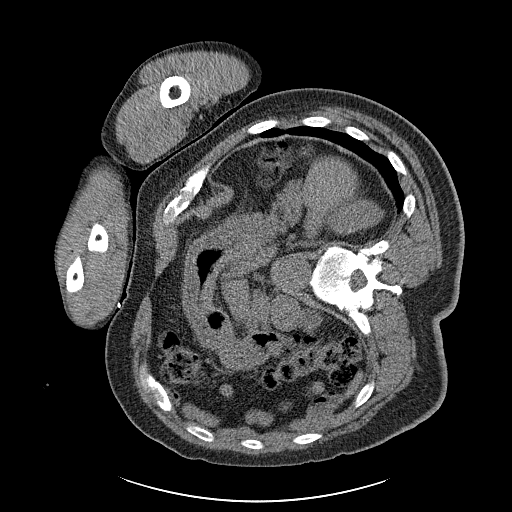
[im 38/49  lung]
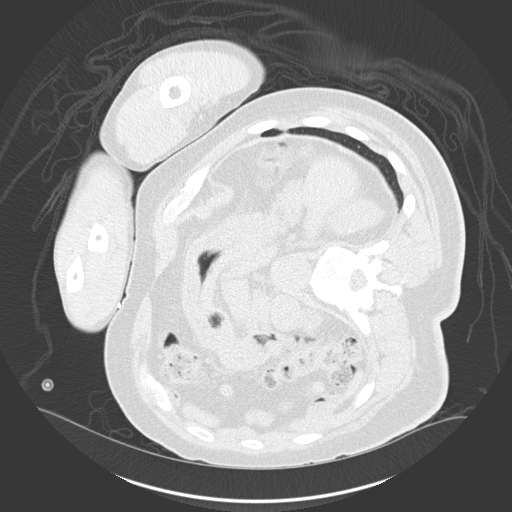
[im 43/49  soft-tissue]
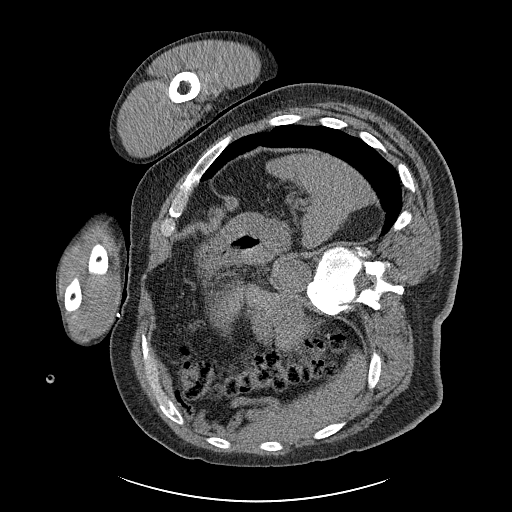
[im 43/49  lung]
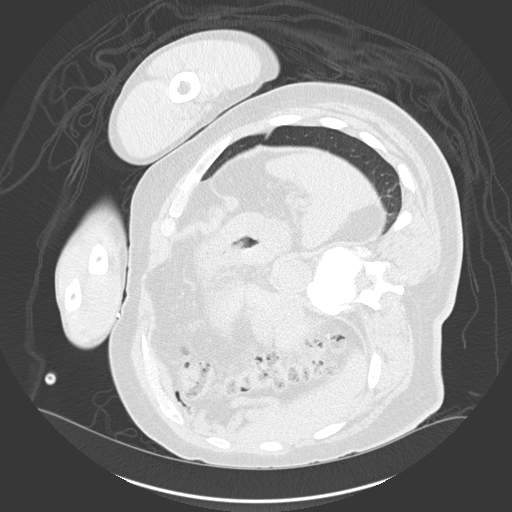

[Series 7: i-spiral 5.0 b40f · axial · 0.89mm/px · z∈[+1319,+1389]mm · 5 of 37 slices shown (2 of 2)]
[im 6/37  soft-tissue]
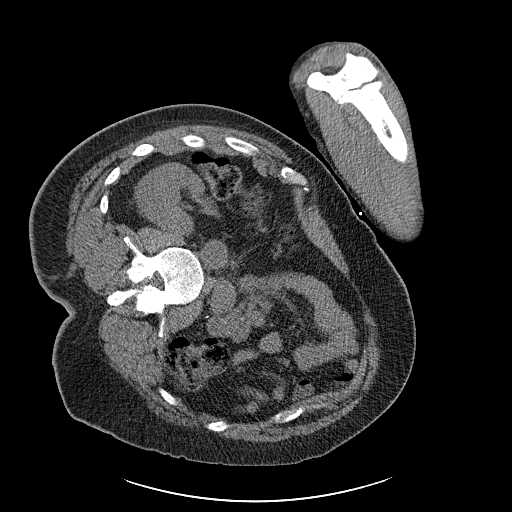
[im 11/37  soft-tissue]
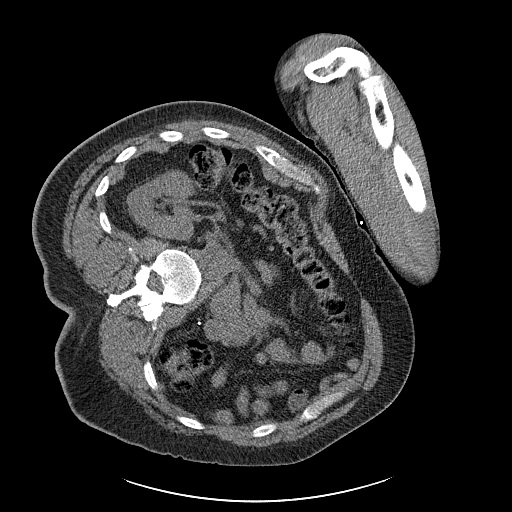
[im 16/37  soft-tissue]
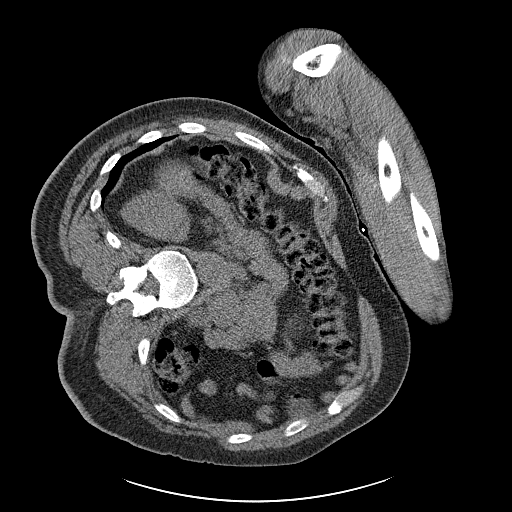
[im 21/37  soft-tissue]
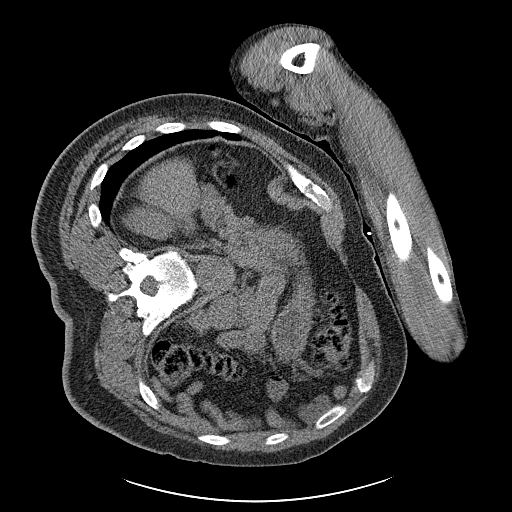
[im 26/37  soft-tissue]
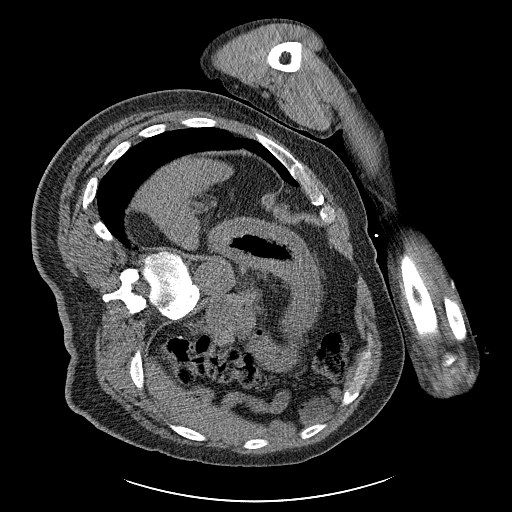

[13 of 32 positions shown; findings below may reference images not displayed]

EXAM:
CT-GUIDED BIOPSY OF RIGHT ADRENAL LESION

MEDICATIONS:
None.

ANESTHESIA/SEDATION:
Fentanyl 2.0 mcg IV; Versed 100 mg IV

Moderate Sedation Time:  35 minutes

The patient was continuously monitored during the procedure by the
interventional radiology nurse under my direct supervision.

COMPLICATIONS:
None immediate.

PROCEDURE:
The procedure was explained to the patient. The risks and benefits
of the procedure were discussed and the patient's questions were
addressed. Informed consent was obtained from the patient. The
patient was placed on his right side. Images through the abdomen
were obtained. The right side of the back was marked. The back was
prepped and draped in sterile fashion. Maximal barrier sterile
technique was utilized including caps, mask, sterile gowns, sterile
gloves, sterile drape, hand hygiene and skin antiseptic. Skin was
anesthetized with 1% lidocaine. Using CT guidance, a 17 gauge
coaxial needle was directed into the posterior aspect of the right
adrenal lesion. Two core biopsies were performed with an 18 gauge
device. No tissue was yielded from these core biopsies. However,
slightly thick bloody fluid was draining from the coaxial needle. 2
ml of bloody fluid was easily aspirated from the coaxial needle.
Following the aspiration, the lesion appeared to be slightly
decompressed with some layering blood products. No additional
samples were obtained. Needle was removed. Bandage placed over the
puncture site.
FINDINGS: 2.5 cm low-density right adrenal lesion. Needle position was placed
along the posterior aspect of the lesion and 2 core biopsies were
performed. However, no tissue was yielded from the core biopsies.
Approximately 2 mL of bloody fluid was easily aspirated from the
lesion. Lesion appeared to be slightly decompressed following the
aspiration. Therefore, no additional samples were obtained. The
fluid was sent for cytology.
IMPRESSION: CT-guided biopsy of the right adrenal lesion. No tissue could be
yielded from the core biopsies. However, 2 mL of bloody fluid was
aspirated from the lesion and the fluid was sent for cytology.

## 2021-02-09 ENCOUNTER — Emergency Department (HOSPITAL_COMMUNITY): Payer: No Typology Code available for payment source

## 2021-02-09 ENCOUNTER — Emergency Department (HOSPITAL_COMMUNITY)
Admission: EM | Admit: 2021-02-09 | Discharge: 2021-02-09 | Disposition: A | Discharge status: Home / Self Care | Payer: No Typology Code available for payment source | Attending: Emergency Medicine | Admitting: Emergency Medicine

## 2021-02-09 ENCOUNTER — Encounter (HOSPITAL_COMMUNITY): Payer: Self-pay

## 2021-02-09 ENCOUNTER — Other Ambulatory Visit: Payer: Self-pay

## 2021-02-09 DIAGNOSIS — S0990XA Unspecified injury of head, initial encounter: Secondary | ICD-10-CM | POA: Insufficient documentation

## 2021-02-09 DIAGNOSIS — Z96642 Presence of left artificial hip joint: Secondary | ICD-10-CM | POA: Diagnosis not present

## 2021-02-09 DIAGNOSIS — Y92410 Unspecified street and highway as the place of occurrence of the external cause: Secondary | ICD-10-CM | POA: Insufficient documentation

## 2021-02-09 DIAGNOSIS — M25552 Pain in left hip: Secondary | ICD-10-CM | POA: Diagnosis not present

## 2021-02-09 DIAGNOSIS — R209 Unspecified disturbances of skin sensation: Secondary | ICD-10-CM | POA: Insufficient documentation

## 2021-02-09 DIAGNOSIS — M25512 Pain in left shoulder: Secondary | ICD-10-CM | POA: Diagnosis not present

## 2021-02-09 DIAGNOSIS — I2510 Atherosclerotic heart disease of native coronary artery without angina pectoris: Secondary | ICD-10-CM | POA: Insufficient documentation

## 2021-02-09 DIAGNOSIS — Z79899 Other long term (current) drug therapy: Secondary | ICD-10-CM | POA: Insufficient documentation

## 2021-02-09 DIAGNOSIS — M542 Cervicalgia: Secondary | ICD-10-CM | POA: Insufficient documentation

## 2021-02-09 DIAGNOSIS — Z96652 Presence of left artificial knee joint: Secondary | ICD-10-CM | POA: Diagnosis not present

## 2021-02-09 DIAGNOSIS — Z7982 Long term (current) use of aspirin: Secondary | ICD-10-CM | POA: Insufficient documentation

## 2021-02-09 DIAGNOSIS — Z8546 Personal history of malignant neoplasm of prostate: Secondary | ICD-10-CM | POA: Diagnosis not present

## 2021-02-09 DIAGNOSIS — I509 Heart failure, unspecified: Secondary | ICD-10-CM | POA: Diagnosis not present

## 2021-02-09 DIAGNOSIS — I110 Hypertensive heart disease with heart failure: Secondary | ICD-10-CM | POA: Diagnosis not present

## 2021-02-09 LAB — CBC WITH DIFFERENTIAL/PLATELET
Abs Immature Granulocytes: 0.01 10*3/uL (ref 0.00–0.07)
Basophils Absolute: 0 10*3/uL (ref 0.0–0.1)
Basophils Relative: 1 %
Eosinophils Absolute: 0.1 10*3/uL (ref 0.0–0.5)
Eosinophils Relative: 2 %
HCT: 38.9 % — ABNORMAL LOW (ref 39.0–52.0)
Hemoglobin: 13 g/dL (ref 13.0–17.0)
Immature Granulocytes: 0 %
Lymphocytes Relative: 34 %
Lymphs Abs: 1.1 10*3/uL (ref 0.7–4.0)
MCH: 30.8 pg (ref 26.0–34.0)
MCHC: 33.4 g/dL (ref 30.0–36.0)
MCV: 92.2 fL (ref 80.0–100.0)
Monocytes Absolute: 0.4 10*3/uL (ref 0.1–1.0)
Monocytes Relative: 13 %
Neutro Abs: 1.7 10*3/uL (ref 1.7–7.7)
Neutrophils Relative %: 50 %
Platelets: 163 10*3/uL (ref 150–400)
RBC: 4.22 MIL/uL (ref 4.22–5.81)
RDW: 14.6 % (ref 11.5–15.5)
WBC: 3.3 10*3/uL — ABNORMAL LOW (ref 4.0–10.5)
nRBC: 0 % (ref 0.0–0.2)
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
## 2022-10-07 NOTE — Progress Notes (Signed)
Sent message, via epic in basket, requesting orders in epic from surgeon.  

## 2022-10-13 NOTE — Patient Instructions (Signed)
SURGICAL WAITING ROOM VISITATION  Patients having surgery or a procedure may have no more than 2 support people in the waiting area - these visitors may rotate.    Children under the age of 41 must have an adult with them who is not the patient.  Due to an increase in RSV and influenza rates and associated hospitalizations, children ages 51 and under may not visit patients in [facility] hospitals.  If the patient needs to stay at the hospital during part of their recovery, the visitor guidelines for inpatient rooms apply. Pre-op nurse will coordinate an appropriate time for 1 support person to accompany patient in pre-op.  This support person may not rotate.    Please refer to the [facility] website for the visitor guidelines for Inpatients (after your surgery is over and you are in a regular room).       Your procedure is scheduled on: 10/26/22   Report to [facility] Main Entrance    Report to admitting at 9 AM   Call this number if you have problems the morning of surgery [phone]   Do not eat food :After Midnight.   After Midnight you may have the following liquids until 8:30 AM DAY OF SURGERY  Water Non-Citrus Juices (without pulp, NO RED-Apple, White grape, White cranberry) Black Coffee (NO MILK/CREAM OR CREAMERS, sugar ok)  Clear Tea (NO MILK/CREAM OR CREAMERS, sugar ok) regular and decaf                             Plain Jell-O (NO RED)                                           Fruit ices (not with fruit pulp, NO RED)                                     Popsicles (NO RED)                                                               Sports drinks like Gatorade (NO RED)                  The day of surgery:  Drink ONE (1) Pre-Surgery Clear Ensure at 8:30 AM the morning of surgery. Drink in one sitting. Do not sip.  This drink was given to you during your hospital  pre-op appointment visit. Nothing else to drink after completing the  Pre-Surgery Clear  Ensure       Oral Hygiene is also important to reduce your risk of infection.                                    Remember - BRUSH YOUR TEETH THE MORNING OF SURGERY WITH YOUR REGULAR TOOTHPASTE  DENTURES WILL BE REMOVED PRIOR TO SURGERY PLEASE DO NOT APPLY "Poly grip" OR ADHESIVES!!!   Stop all vitamins and herbal supplements 7 days before surgery.   Take these  medicines the morning of surgery : Tylenol if needed, Amlodipine, , Hydralazine, Tamsulosin             You may not have any metal on your body including hair pins, jewelry, and body piercing             Do not wear make-up, lotions, powders, cologne, or deodorant              Men may shave face and neck.   Do not bring valuables to the hospital. [facility] IS NOT             RESPONSIBLE   FOR VALUABLES.   Contacts, glasses, dentures or bridgework may not be worn into surgery.   Bring small overnight bag day of surgery.   DO NOT BRING YOUR HOME MEDICATIONS TO THE HOSPITAL. PHARMACY WILL DISPENSE MEDICATIONS LISTED ON YOUR MEDICATION LIST TO YOU DURING YOUR ADMISSION IN THE HOSPITAL!    Patients discharged on the day of surgery will not be allowed to drive home.  Someone NEEDS to stay with you for the first 24 hours after anesthesia.   Special Instructions: Bring a copy of your healthcare power of attorney and living will documents the day of surgery if you haven't scanned them before.              Please read over the following fact sheets you were given: IF YOU HAVE QUESTIONS ABOUT YOUR PRE-OP INSTRUCTIONS PLEASE CALL [phone] [facility]   If you received a COVID test during your pre-op visit  it is requested that you wear a mask when out in public, stay away from anyone that may not be feeling well and notify your surgeon if you develop symptoms. If you test positive for Covid or have been in contact with anyone that has tested positive in the last 10 days please notify you surgeon.      Pre-operative 5 CHG Bath  Instructions   You can play a key role in reducing the risk of infection after surgery. Your skin needs to be as free of germs as possible. You can reduce the number of germs on your skin by washing with CHG (chlorhexidine gluconate) soap before surgery. CHG is an antiseptic soap that kills germs and continues to kill germs even after washing.   DO NOT use if you have an allergy to chlorhexidine/CHG or antibacterial soaps. If your skin becomes reddened or irritated, stop using the CHG and notify one of our RNs at [phone].   Please shower with the CHG soap starting 4 days before surgery using the following schedule:     Please keep in mind the following:  DO NOT shave, including legs and underarms, starting the day of your first shower.   You may shave your face at any point before/day of surgery.  Place clean sheets on your bed the day you start using CHG soap. Use a clean washcloth (not used since being washed) for each shower. DO NOT sleep with pets once you start using the CHG.   CHG Shower Instructions:  If you choose to wash your hair and private area, wash first with your normal shampoo/soap.  After you use shampoo/soap, rinse your hair and body thoroughly to remove shampoo/soap residue.  Turn the water OFF and apply about 3 tablespoons (45 ml) of CHG soap to a CLEAN washcloth.  Apply CHG soap ONLY FROM YOUR NECK DOWN TO YOUR TOES (washing for 3-5 minutes)  DO NOT use CHG soap  on face, private areas, open wounds, or sores.  Pay special attention to the area where your surgery is being performed.  If you are having back surgery, having someone wash your back for you may be helpful. Wait 2 minutes after CHG soap is applied, then you may rinse off the CHG soap.  Pat dry with a clean towel  Put on clean clothes/pajamas   If you choose to wear lotion, please use ONLY the CHG-compatible lotions on the back of this paper.     Additional instructions for the day of surgery: DO NOT  APPLY any lotions, deodorants, cologne, or perfumes.   Put on clean/comfortable clothes.  Brush your teeth.  Ask your nurse before applying any prescription medications to the skin.      CHG Compatible Lotions   Aveeno Moisturizing lotion  Cetaphil Moisturizing Cream  Cetaphil Moisturizing Lotion  Clairol Herbal Essence Moisturizing Lotion, Dry Skin  Clairol Herbal Essence Moisturizing Lotion, Extra Dry Skin  Clairol Herbal Essence Moisturizing Lotion, Normal Skin  Curel Age Defying Therapeutic Moisturizing Lotion with Alpha Hydroxy  Curel Extreme Care Body Lotion  Curel Soothing Hands Moisturizing Hand Lotion  Curel Therapeutic Moisturizing Cream, Fragrance-Free  Curel Therapeutic Moisturizing Lotion, Fragrance-Free  Curel Therapeutic Moisturizing Lotion, Original Formula  Eucerin Daily Replenishing Lotion  Eucerin Dry Skin Therapy Plus Alpha Hydroxy Crme  Eucerin Dry Skin Therapy Plus Alpha Hydroxy Lotion  Eucerin Original Crme  Eucerin Original Lotion  Eucerin Plus Crme Eucerin Plus Lotion  Eucerin TriLipid Replenishing Lotion  Keri Anti-Bacterial Hand Lotion  Keri Deep Conditioning Original Lotion Dry Skin Formula Softly Scented  Keri Deep Conditioning Original Lotion, Fragrance Free Sensitive Skin Formula  Keri Lotion Fast Absorbing Fragrance Free Sensitive Skin Formula  Keri Lotion Fast Absorbing Softly Scented Dry Skin Formula  Keri Original Lotion  Keri Skin Renewal Lotion Keri Silky Smooth Lotion  Keri Silky Smooth Sensitive Skin Lotion  Nivea Body Creamy Conditioning Oil  Nivea Body Extra Enriched Teacher, adult education Moisturizing Lotion Nivea Crme  Nivea Skin Firming Lotion  NutraDerm 30 Skin Lotion  NutraDerm Skin Lotion  NutraDerm Therapeutic Skin Cream  NutraDerm Therapeutic Skin Lotion  ProShield Protective Hand Cream  Provon moisturizing lotion  WHAT IS A BLOOD TRANSFUSION? Blood Transfusion Information  A  transfusion is the replacement of blood or some of its parts. Blood is made up of multiple cells which provide different functions. Red blood cells carry oxygen and are used for blood loss replacement. White blood cells fight against infection. Platelets control bleeding. Plasma helps clot blood. Other blood products are available for specialized needs, such as hemophilia or other clotting disorders. BEFORE THE TRANSFUSION  Who gives blood for transfusions?  Healthy volunteers who are fully evaluated to make sure their blood is safe. This is blood bank blood. Transfusion therapy is the safest it has ever been in the practice of medicine. Before blood is taken from a donor, a complete history is taken to make sure that person has no history of diseases nor engages in risky social behavior (examples are intravenous drug use or sexual activity with multiple partners). The donor's travel history is screened to minimize risk of transmitting infections, such as malaria. The donated blood is tested for signs of infectious diseases, such as HIV and hepatitis. The blood is then tested to be sure it is compatible with you in order to minimize the chance of a transfusion reaction. If you or a relative donates blood,  this is often done in anticipation of surgery and is not appropriate for emergency situations. It takes many days to process the donated blood. RISKS AND COMPLICATIONS Although transfusion therapy is very safe and saves many lives, the main dangers of transfusion include:  Getting an infectious disease. Developing a transfusion reaction. This is an allergic reaction to something in the blood you were given. Every precaution is taken to prevent this. The decision to have a blood transfusion has been considered carefully by your caregiver before blood is given. Blood is not given unless the benefits outweigh the risks. AFTER THE TRANSFUSION Right after receiving a blood transfusion, you will usually  feel much better and more energetic. This is especially true if your red blood cells have gotten low (anemic). The transfusion raises the level of the red blood cells which carry oxygen, and this usually causes an energy increase. The nurse administering the transfusion will monitor you carefully for complications. HOME CARE INSTRUCTIONS  No special instructions are needed after a transfusion. You may find your energy is better. Speak with your caregiver about any limitations on activity for underlying diseases you may have. SEEK MEDICAL CARE IF:  Your condition is not improving after your transfusion. You develop redness or irritation at the intravenous (IV) site. SEEK IMMEDIATE MEDICAL CARE IF:  Any of the following symptoms occur over the next 12 hours: Shaking chills. You have a temperature by mouth above 102 F (38.9 C), not controlled by medicine. Chest, back, or muscle pain. People around you feel you are not acting correctly or are confused. Shortness of breath or difficulty breathing. Dizziness and fainting. You get a rash or develop hives. You have a decrease in urine output. Your urine turns a dark color or changes to pink, red, or brown. Any of the following symptoms occur over the next 10 days: You have a temperature by mouth above 102 F (38.9 C), not controlled by medicine. Shortness of breath. Weakness after normal activity. The white part of the eye turns yellow (jaundice). You have a decrease in the amount of urine or are urinating less often. Your urine turns a dark color or changes to pink, red, or brown. Document Released: 02/27/2000 Document Revised: 05/24/2011 Document Reviewed: 10/16/2007 [facility] Patient Information 2014 [place], [place].

## 2022-10-13 NOTE — Progress Notes (Signed)
COVID Vaccine received:  []  No [x]  Yes Date of any COVID positive Test in last 90 days: No PCP - [facility] Cardiologist -   Chest x-ray -  EKG -  10/14/22 EPIC Stress Test -  ECHO -  Cardiac Cath -   Bowel Prep - [x]  No  []   Yes ______  Pacemaker / ICD device [x]  No []  Yes   Spinal Cord Stimulator:[x]  No []  Yes       History of Sleep Apnea? [x]  No []  Yes   CPAP used?- [x]  No []  Yes    Does the patient monitor blood sugar?          [x]  No []  Yes  []  N/A  Patient has: [x]  NO Hx DM   []  Pre-DM                 []  DM1  []   DM2 Does patient have a [employer] or Dexacom? []  No []  Yes   Fasting Blood Sugar Ranges-  Checks Blood Sugar _____ times a day  GLP1 agonist / usual dose - no GLP1 instructions:  SGLT-2 inhibitors / usual dose - no SGLT-2 instructions:   Blood Thinner / Instructions:Xarelto last dose to be 10/21/22 Aspirin Instructions:ASA last dose 10/21/22   Comments: NOTES requested from [place]  Activity level: Patient is ablet o climb a flight of stairs without difficulty; [x]  No CP  [x]  No SOB, but would have _hip pain__   Patient can perform ADLs without assistance.   Anesthesia review: HTN, CAD,DVT,A-fib, CHF  Patient denies shortness of breath, fever, cough and chest pain at PAT appointment.  Patient verbalized understanding and agreement to the Pre-Surgical Instructions that were given to them at this PAT appointment. Patient was also educated of the need to review these PAT instructions again prior to his/her surgery.I reviewed the appropriate phone numbers to call if they have any and questions or concerns.

## 2022-10-14 ENCOUNTER — Other Ambulatory Visit: Payer: Self-pay

## 2022-10-14 ENCOUNTER — Encounter (HOSPITAL_COMMUNITY): Payer: Self-pay

## 2022-10-14 ENCOUNTER — Encounter (HOSPITAL_COMMUNITY)
Admission: RE | Admit: 2022-10-14 | Discharge: 2022-10-14 | Disposition: A | Discharge status: Home / Self Care | Payer: No Typology Code available for payment source | Source: Ambulatory Visit | Attending: Orthopedic Surgery | Admitting: Orthopedic Surgery

## 2022-10-14 VITALS — BP 153/102 | HR 78 | Temp 98.1°F | Resp 16 | Ht 75.0 in | Wt 220.0 lb

## 2022-10-14 DIAGNOSIS — I4891 Unspecified atrial fibrillation: Secondary | ICD-10-CM | POA: Insufficient documentation

## 2022-10-14 DIAGNOSIS — Z01818 Encounter for other preprocedural examination: Secondary | ICD-10-CM | POA: Diagnosis not present

## 2022-10-14 DIAGNOSIS — I10 Essential (primary) hypertension: Secondary | ICD-10-CM | POA: Diagnosis not present

## 2022-10-14 DIAGNOSIS — I2510 Atherosclerotic heart disease of native coronary artery without angina pectoris: Secondary | ICD-10-CM | POA: Diagnosis not present

## 2022-10-14 HISTORY — DX: Unspecified osteoarthritis, unspecified site: M19.90

## 2022-10-14 LAB — TYPE AND SCREEN
ABO/RH(D): O POS
Antibody Screen: NEGATIVE

## 2022-10-14 LAB — BASIC METABOLIC PANEL WITH GFR
Anion gap: 9 (ref 5–15)
BUN: 25 mg/dL — ABNORMAL HIGH (ref 8–23)
CO2: 23 mmol/L (ref 22–32)
Calcium: 9.6 mg/dL (ref 8.9–10.3)
Chloride: 109 mmol/L (ref 98–111)
Creatinine, Ser: 1.32 mg/dL — ABNORMAL HIGH (ref 0.61–1.24)
GFR, Estimated: 58 mL/min — ABNORMAL LOW
Glucose, Bld: 78 mg/dL (ref 70–99)
Potassium: 4.7 mmol/L (ref 3.5–5.1)
Sodium: 141 mmol/L (ref 135–145)

## 2022-10-14 LAB — CBC
HCT: 43.2 % (ref 39.0–52.0)
Hemoglobin: 14.2 g/dL (ref 13.0–17.0)
MCH: 30 pg (ref 26.0–34.0)
MCHC: 32.9 g/dL (ref 30.0–36.0)
MCV: 91.1 fL (ref 80.0–100.0)
Platelets: 195 K/uL (ref 150–400)
RBC: 4.74 MIL/uL (ref 4.22–5.81)
RDW: 15.1 % (ref 11.5–15.5)
WBC: 3.7 K/uL — ABNORMAL LOW (ref 4.0–10.5)
nRBC: 0 % (ref 0.0–0.2)

## 2022-10-14 LAB — SURGICAL PCR SCREEN
MRSA, PCR: NEGATIVE
Staphylococcus aureus: NEGATIVE

## 2022-10-20 ENCOUNTER — Encounter (HOSPITAL_COMMUNITY): Payer: Self-pay

## 2022-10-21 ENCOUNTER — Encounter (HOSPITAL_COMMUNITY): Payer: Self-pay | Admitting: Physician Assistant

## 2022-10-22 ENCOUNTER — Encounter (HOSPITAL_COMMUNITY)
Admission: EM | Disposition: A | Discharge status: Rehabilitation Facility | Payer: Self-pay | Source: Home / Self Care | Attending: Neurology

## 2022-10-22 ENCOUNTER — Emergency Department (HOSPITAL_COMMUNITY): Payer: No Typology Code available for payment source

## 2022-10-22 ENCOUNTER — Inpatient Hospital Stay (HOSPITAL_COMMUNITY)
Admission: EM | Admit: 2022-10-22 | Discharge: 2022-10-26 | DRG: 023 | Disposition: A | Discharge status: Rehabilitation Facility | Payer: No Typology Code available for payment source | Attending: Neurology | Admitting: Neurology

## 2022-10-22 ENCOUNTER — Emergency Department (HOSPITAL_COMMUNITY): Payer: No Typology Code available for payment source | Admitting: Anesthesiology

## 2022-10-22 DIAGNOSIS — Z86718 Personal history of other venous thrombosis and embolism: Secondary | ICD-10-CM

## 2022-10-22 DIAGNOSIS — N179 Acute kidney failure, unspecified: Secondary | ICD-10-CM | POA: Diagnosis not present

## 2022-10-22 DIAGNOSIS — G8191 Hemiplegia, unspecified affecting right dominant side: Secondary | ICD-10-CM | POA: Diagnosis present

## 2022-10-22 DIAGNOSIS — I609 Nontraumatic subarachnoid hemorrhage, unspecified: Secondary | ICD-10-CM | POA: Diagnosis not present

## 2022-10-22 DIAGNOSIS — I69353 Hemiplegia and hemiparesis following cerebral infarction affecting right non-dominant side: Secondary | ICD-10-CM | POA: Diagnosis not present

## 2022-10-22 DIAGNOSIS — Z79899 Other long term (current) drug therapy: Secondary | ICD-10-CM | POA: Diagnosis not present

## 2022-10-22 DIAGNOSIS — R1310 Dysphagia, unspecified: Secondary | ICD-10-CM | POA: Diagnosis present

## 2022-10-22 DIAGNOSIS — R471 Dysarthria and anarthria: Secondary | ICD-10-CM | POA: Diagnosis present

## 2022-10-22 DIAGNOSIS — Z96652 Presence of left artificial knee joint: Secondary | ICD-10-CM | POA: Diagnosis present

## 2022-10-22 DIAGNOSIS — R29810 Facial weakness: Secondary | ICD-10-CM | POA: Diagnosis present

## 2022-10-22 DIAGNOSIS — C61 Malignant neoplasm of prostate: Secondary | ICD-10-CM | POA: Diagnosis not present

## 2022-10-22 DIAGNOSIS — I6602 Occlusion and stenosis of left middle cerebral artery: Secondary | ICD-10-CM

## 2022-10-22 DIAGNOSIS — E1122 Type 2 diabetes mellitus with diabetic chronic kidney disease: Secondary | ICD-10-CM | POA: Diagnosis present

## 2022-10-22 DIAGNOSIS — E7800 Pure hypercholesterolemia, unspecified: Secondary | ICD-10-CM | POA: Diagnosis present

## 2022-10-22 DIAGNOSIS — I69320 Aphasia following cerebral infarction: Secondary | ICD-10-CM | POA: Diagnosis not present

## 2022-10-22 DIAGNOSIS — Z85528 Personal history of other malignant neoplasm of kidney: Secondary | ICD-10-CM | POA: Diagnosis not present

## 2022-10-22 DIAGNOSIS — I110 Hypertensive heart disease with heart failure: Secondary | ICD-10-CM

## 2022-10-22 DIAGNOSIS — I63412 Cerebral infarction due to embolism of left middle cerebral artery: Secondary | ICD-10-CM | POA: Diagnosis present

## 2022-10-22 DIAGNOSIS — Z8673 Personal history of transient ischemic attack (TIA), and cerebral infarction without residual deficits: Secondary | ICD-10-CM | POA: Diagnosis not present

## 2022-10-22 DIAGNOSIS — I480 Paroxysmal atrial fibrillation: Secondary | ICD-10-CM | POA: Diagnosis present

## 2022-10-22 DIAGNOSIS — N183 Chronic kidney disease, stage 3 unspecified: Secondary | ICD-10-CM | POA: Diagnosis present

## 2022-10-22 DIAGNOSIS — D696 Thrombocytopenia, unspecified: Secondary | ICD-10-CM | POA: Diagnosis not present

## 2022-10-22 DIAGNOSIS — I252 Old myocardial infarction: Secondary | ICD-10-CM

## 2022-10-22 DIAGNOSIS — F1990 Other psychoactive substance use, unspecified, uncomplicated: Secondary | ICD-10-CM | POA: Diagnosis not present

## 2022-10-22 DIAGNOSIS — Z7982 Long term (current) use of aspirin: Secondary | ICD-10-CM

## 2022-10-22 DIAGNOSIS — Z8249 Family history of ischemic heart disease and other diseases of the circulatory system: Secondary | ICD-10-CM

## 2022-10-22 DIAGNOSIS — Z7901 Long term (current) use of anticoagulants: Secondary | ICD-10-CM

## 2022-10-22 DIAGNOSIS — Z91148 Patient's other noncompliance with medication regimen for other reason: Secondary | ICD-10-CM | POA: Diagnosis not present

## 2022-10-22 DIAGNOSIS — R29716 NIHSS score 16: Secondary | ICD-10-CM | POA: Diagnosis present

## 2022-10-22 DIAGNOSIS — I2510 Atherosclerotic heart disease of native coronary artery without angina pectoris: Secondary | ICD-10-CM | POA: Diagnosis present

## 2022-10-22 DIAGNOSIS — E785 Hyperlipidemia, unspecified: Secondary | ICD-10-CM | POA: Diagnosis not present

## 2022-10-22 DIAGNOSIS — Z905 Acquired absence of kidney: Secondary | ICD-10-CM

## 2022-10-22 DIAGNOSIS — K5900 Constipation, unspecified: Secondary | ICD-10-CM | POA: Diagnosis not present

## 2022-10-22 DIAGNOSIS — I509 Heart failure, unspecified: Secondary | ICD-10-CM

## 2022-10-22 DIAGNOSIS — I639 Cerebral infarction, unspecified: Secondary | ICD-10-CM

## 2022-10-22 DIAGNOSIS — E871 Hypo-osmolality and hyponatremia: Secondary | ICD-10-CM | POA: Diagnosis not present

## 2022-10-22 DIAGNOSIS — I161 Hypertensive emergency: Secondary | ICD-10-CM | POA: Diagnosis present

## 2022-10-22 DIAGNOSIS — I129 Hypertensive chronic kidney disease with stage 1 through stage 4 chronic kidney disease, or unspecified chronic kidney disease: Secondary | ICD-10-CM | POA: Diagnosis present

## 2022-10-22 DIAGNOSIS — N189 Chronic kidney disease, unspecified: Secondary | ICD-10-CM | POA: Diagnosis not present

## 2022-10-22 DIAGNOSIS — R1312 Dysphagia, oropharyngeal phase: Secondary | ICD-10-CM | POA: Diagnosis not present

## 2022-10-22 DIAGNOSIS — R339 Retention of urine, unspecified: Secondary | ICD-10-CM | POA: Diagnosis not present

## 2022-10-22 DIAGNOSIS — Z8546 Personal history of malignant neoplasm of prostate: Secondary | ICD-10-CM

## 2022-10-22 DIAGNOSIS — F1410 Cocaine abuse, uncomplicated: Secondary | ICD-10-CM | POA: Diagnosis present

## 2022-10-22 DIAGNOSIS — I4891 Unspecified atrial fibrillation: Secondary | ICD-10-CM | POA: Diagnosis not present

## 2022-10-22 DIAGNOSIS — I10 Essential (primary) hypertension: Secondary | ICD-10-CM | POA: Diagnosis not present

## 2022-10-22 DIAGNOSIS — R569 Unspecified convulsions: Secondary | ICD-10-CM | POA: Diagnosis not present

## 2022-10-22 DIAGNOSIS — I63512 Cerebral infarction due to unspecified occlusion or stenosis of left middle cerebral artery: Secondary | ICD-10-CM | POA: Diagnosis not present

## 2022-10-22 DIAGNOSIS — Z832 Family history of diseases of the blood and blood-forming organs and certain disorders involving the immune mechanism: Secondary | ICD-10-CM

## 2022-10-22 DIAGNOSIS — G8111 Spastic hemiplegia affecting right dominant side: Secondary | ICD-10-CM | POA: Diagnosis not present

## 2022-10-22 DIAGNOSIS — K5901 Slow transit constipation: Secondary | ICD-10-CM | POA: Diagnosis not present

## 2022-10-22 DIAGNOSIS — Z955 Presence of coronary angioplasty implant and graft: Secondary | ICD-10-CM

## 2022-10-22 DIAGNOSIS — Z96642 Presence of left artificial hip joint: Secondary | ICD-10-CM | POA: Diagnosis present

## 2022-10-22 HISTORY — PX: IR CT HEAD LTD: IMG2386

## 2022-10-22 HISTORY — DX: Alcohol abuse, in remission: F10.11

## 2022-10-22 HISTORY — PX: RADIOLOGY WITH ANESTHESIA: SHX6223

## 2022-10-22 HISTORY — DX: Other chronic pain: G89.29

## 2022-10-22 HISTORY — DX: Osteoarthritis of hip, unspecified: M16.9

## 2022-10-22 HISTORY — PX: IR PERCUTANEOUS ART THROMBECTOMY/INFUSION INTRACRANIAL INC DIAG ANGIO: IMG6087

## 2022-10-22 HISTORY — PX: IR ANGIO VERTEBRAL SEL SUBCLAVIAN INNOMINATE UNI L MOD SED: IMG5364

## 2022-10-22 LAB — CBG MONITORING, ED: Glucose-Capillary: 114 mg/dL — ABNORMAL HIGH (ref 70–99)

## 2022-10-22 LAB — URINALYSIS, ROUTINE W REFLEX MICROSCOPIC
Bilirubin Urine: NEGATIVE
Glucose, UA: NEGATIVE mg/dL
Ketones, ur: NEGATIVE mg/dL
Leukocytes,Ua: NEGATIVE
Nitrite: NEGATIVE
Protein, ur: NEGATIVE mg/dL
RBC / HPF: >50 RBC/hpf (ref 0–5)
Specific Gravity, Urine: 1.039 — ABNORMAL HIGH (ref 1.005–1.030)
pH: 6 (ref 5.0–8.0)

## 2022-10-22 LAB — DIFFERENTIAL
Abs Immature Granulocytes: 0.02 10*3/uL (ref 0.00–0.07)
Basophils Absolute: 0 10*3/uL (ref 0.0–0.1)
Basophils Relative: 0 %
Eosinophils Absolute: 0 10*3/uL (ref 0.0–0.5)
Eosinophils Relative: 0 %
Immature Granulocytes: 0 %
Lymphocytes Relative: 21 %
Lymphs Abs: 1 10*3/uL (ref 0.7–4.0)
Monocytes Absolute: 0.4 10*3/uL (ref 0.1–1.0)
Monocytes Relative: 9 %
Neutro Abs: 3.3 10*3/uL (ref 1.7–7.7)
Neutrophils Relative %: 70 %

## 2022-10-22 LAB — HIV ANTIBODY (ROUTINE TESTING W REFLEX): HIV Screen 4th Generation wRfx: NONREACTIVE

## 2022-10-22 LAB — CBC
HCT: 45 % (ref 39.0–52.0)
Hemoglobin: 15 g/dL (ref 13.0–17.0)
MCH: 29.7 pg (ref 26.0–34.0)
MCHC: 33.3 g/dL (ref 30.0–36.0)
MCV: 89.1 fL (ref 80.0–100.0)
Platelets: 121 10*3/uL — ABNORMAL LOW (ref 150–400)
RBC: 5.05 MIL/uL (ref 4.22–5.81)
RDW: 14.5 % (ref 11.5–15.5)
WBC: 4.8 10*3/uL (ref 4.0–10.5)
nRBC: 0 % (ref 0.0–0.2)

## 2022-10-22 LAB — PROTIME-INR
INR: 1.2 (ref 0.8–1.2)
Prothrombin Time: 15.7 seconds — ABNORMAL HIGH (ref 11.4–15.2)

## 2022-10-22 LAB — RAPID URINE DRUG SCREEN, HOSP PERFORMED
Amphetamines: NOT DETECTED
Barbiturates: NOT DETECTED
Benzodiazepines: NOT DETECTED
Cocaine: POSITIVE — AB
Opiates: NOT DETECTED
Tetrahydrocannabinol: NOT DETECTED

## 2022-10-22 LAB — I-STAT CHEM 8, ED
BUN: 20 mg/dL (ref 8–23)
Calcium, Ion: 1.12 mmol/L — ABNORMAL LOW (ref 1.15–1.40)
Chloride: 108 mmol/L (ref 98–111)
Creatinine, Ser: 1.4 mg/dL — ABNORMAL HIGH (ref 0.61–1.24)
Glucose, Bld: 102 mg/dL — ABNORMAL HIGH (ref 70–99)
HCT: 45 % (ref 39.0–52.0)
Hemoglobin: 15.3 g/dL (ref 13.0–17.0)
Potassium: 4.3 mmol/L (ref 3.5–5.1)
Sodium: 138 mmol/L (ref 135–145)
TCO2: 20 mmol/L — ABNORMAL LOW (ref 22–32)

## 2022-10-22 LAB — MRSA NEXT GEN BY PCR, NASAL: MRSA by PCR Next Gen: NOT DETECTED

## 2022-10-22 LAB — APTT: aPTT: 27 seconds (ref 24–36)

## 2022-10-22 LAB — HEMOGLOBIN A1C
Hgb A1c MFr Bld: 5.2 % (ref 4.8–5.6)
Mean Plasma Glucose: 102.54 mg/dL

## 2022-10-22 LAB — ETHANOL: Alcohol, Ethyl (B): <10 mg/dL (ref <10)

## 2022-10-22 SURGERY — IR WITH ANESTHESIA
Anesthesia: General

## 2022-10-22 MED ORDER — CLEVIDIPINE BUTYRATE 0.5 MG/ML IV EMUL
INTRAVENOUS | Status: AC
  Filled 2022-10-22: qty 50

## 2022-10-22 MED ORDER — ACETAMINOPHEN 650 MG RE SUPP: 650.0000 mg | RECTAL | Status: DC | PRN

## 2022-10-22 MED ORDER — LIDOCAINE 2% (20 MG/ML) 5 ML SYRINGE
INTRAMUSCULAR | Status: DC | PRN
  Administered 2022-10-22: 40 mg via INTRAVENOUS

## 2022-10-22 MED ORDER — LABETALOL HCL 5 MG/ML IV SOLN
INTRAVENOUS | Status: DC | PRN
  Administered 2022-10-22 (×2): 5 mg via INTRAVENOUS

## 2022-10-22 MED ORDER — SUGAMMADEX SODIUM 200 MG/2ML IV SOLN
INTRAVENOUS | Status: DC | PRN
  Administered 2022-10-22: 200 mg via INTRAVENOUS

## 2022-10-22 MED ORDER — STROKE: EARLY STAGES OF RECOVERY BOOK: Freq: Once | Status: DC

## 2022-10-22 MED ORDER — SODIUM CHLORIDE (PF) 0.9 % IJ SOLN
INTRAVENOUS | Status: AC | PRN
  Administered 2022-10-22: 25 ug via INTRA_ARTERIAL

## 2022-10-22 MED ORDER — SODIUM CHLORIDE 0.9 % IV SOLN: INTRAVENOUS | Status: DC

## 2022-10-22 MED ORDER — SENNOSIDES-DOCUSATE SODIUM 8.6-50 MG PO TABS: 1.0000 | ORAL_TABLET | Freq: Every evening | ORAL | Status: DC | PRN

## 2022-10-22 MED ORDER — PROPOFOL 10 MG/ML IV BOLUS
INTRAVENOUS | Status: DC | PRN
  Administered 2022-10-22: 100 mg via INTRAVENOUS

## 2022-10-22 MED ORDER — IOHEXOL 350 MG/ML SOLN
100.0000 mL | Freq: Once | INTRAVENOUS | Status: AC | PRN
  Administered 2022-10-22: 100 mL via INTRAVENOUS

## 2022-10-22 MED ORDER — FENTANYL CITRATE (PF) 100 MCG/2ML IJ SOLN
INTRAMUSCULAR | Status: AC
  Filled 2022-10-22: qty 2

## 2022-10-22 MED ORDER — ACETAMINOPHEN 325 MG PO TABS: 650.0000 mg | ORAL_TABLET | ORAL | Status: DC | PRN

## 2022-10-22 MED ORDER — CHLORHEXIDINE GLUCONATE CLOTH 2 % EX PADS
6.0000 | MEDICATED_PAD | Freq: Every day | CUTANEOUS | Status: DC
  Administered 2022-10-22 – 2022-10-24 (×2): 6 via TOPICAL

## 2022-10-22 MED ORDER — ACETAMINOPHEN 160 MG/5ML PO SOLN: 650.0000 mg | ORAL | Status: DC | PRN

## 2022-10-22 MED ORDER — NITROGLYCERIN 1 MG/10 ML FOR IR/CATH LAB
INTRA_ARTERIAL | Status: AC
  Filled 2022-10-22: qty 10

## 2022-10-22 MED ORDER — FENTANYL CITRATE (PF) 250 MCG/5ML IJ SOLN
INTRAMUSCULAR | Status: DC | PRN
  Administered 2022-10-22 (×2): 50 ug via INTRAVENOUS

## 2022-10-22 MED ORDER — CEFAZOLIN SODIUM-DEXTROSE 2-3 GM-%(50ML) IV SOLR
INTRAVENOUS | Status: DC | PRN
  Administered 2022-10-22: 2 g via INTRAVENOUS

## 2022-10-22 MED ORDER — ORAL CARE MOUTH RINSE: 15.0000 mL | OROMUCOSAL | Status: DC | PRN

## 2022-10-22 MED ORDER — IOHEXOL 300 MG/ML  SOLN
150.0000 mL | Freq: Once | INTRAMUSCULAR | Status: AC | PRN
  Administered 2022-10-22: 80 mL via INTRA_ARTERIAL

## 2022-10-22 MED ORDER — CLEVIDIPINE BUTYRATE 0.5 MG/ML IV EMUL
0.0000 mg/h | INTRAVENOUS | Status: AC
  Administered 2022-10-22: 4 mg/h via INTRAVENOUS
  Administered 2022-10-23: 1 mg/h via INTRAVENOUS
  Filled 2022-10-22: qty 50
  Filled 2022-10-22: qty 100

## 2022-10-22 MED ORDER — ROCURONIUM BROMIDE 10 MG/ML (PF) SYRINGE
PREFILLED_SYRINGE | INTRAVENOUS | Status: DC | PRN
  Administered 2022-10-22: 20 mg via INTRAVENOUS
  Administered 2022-10-22: 60 mg via INTRAVENOUS

## 2022-10-22 MED ORDER — CLEVIDIPINE BUTYRATE 0.5 MG/ML IV EMUL
INTRAVENOUS | Status: DC | PRN
Start: 2022-10-22 — End: 2022-10-22
  Administered 2022-10-22: 1 mg/h via INTRAVENOUS

## 2022-10-22 MED ORDER — SUCCINYLCHOLINE CHLORIDE 200 MG/10ML IV SOSY
PREFILLED_SYRINGE | INTRAVENOUS | Status: DC | PRN
  Administered 2022-10-22: 140 mg via INTRAVENOUS

## 2022-10-22 MED ORDER — DEXAMETHASONE SODIUM PHOSPHATE 10 MG/ML IJ SOLN
INTRAMUSCULAR | Status: DC | PRN
  Administered 2022-10-22: 10 mg via INTRAVENOUS

## 2022-10-22 MED ORDER — PHENYLEPHRINE HCL-NACL 20-0.9 MG/250ML-% IV SOLN
INTRAVENOUS | Status: DC | PRN
  Administered 2022-10-22: 25 ug/min via INTRAVENOUS

## 2022-10-22 MED ORDER — SODIUM CHLORIDE 0.9 % IV SOLN: INTRAVENOUS | Status: DC | PRN

## 2022-10-22 MED ORDER — IOHEXOL 300 MG/ML  SOLN
150.0000 mL | Freq: Once | INTRAMUSCULAR | Status: AC | PRN
  Administered 2022-10-22: 20 mL via INTRA_ARTERIAL

## 2022-10-22 MED ORDER — CEFAZOLIN SODIUM-DEXTROSE 2-4 GM/100ML-% IV SOLN
INTRAVENOUS | Status: AC
  Filled 2022-10-22: qty 100

## 2022-10-22 NOTE — Anesthesia Procedure Notes (Addendum)
Arterial Line Insertion Start/End8/11/2022 11:30 AM, 10/22/2022 11:35 AM Performed by: Dorie Rank, CRNA, CRNA  Preanesthetic checklist: patient identified, IV checked, site marked, risks and benefits discussed, surgical consent, monitors and equipment checked and pre-op evaluation Patient sedated Left, radial was placed Catheter size: 20 G Hand hygiene performed , maximum sterile barriers used  and Seldinger technique used Allen's test indicative of satisfactory collateral circulation Attempts: 1 Procedure performed without using ultrasound guided technique. Following insertion, Biopatch and dressing applied. Post procedure assessment: normal

## 2022-10-22 NOTE — ED Triage Notes (Signed)
LKN at 0500AM. Found by wife on the floor beside the recliner. Here for facial droop, slurred speech and right sided weakness. Hx of stroke on 2013. On xarelto. No deficits from previous stroke. Code stroke.

## 2022-10-22 NOTE — ED Provider Notes (Signed)
[facility] NEURO/TRAUMA/SURGICAL ICU Provider Note   CSN: [id number] Arrival date & time: 10/22/22  1013  An emergency department physician performed an initial assessment on this suspected stroke patient at 1014.  History {Add pertinent medical, surgical, social history, OB history to HPI:1} Chief Complaint  Patient presents with   Code Stroke    Frank Barron is a 72 y.o. male.  HPI     Home Medications Prior to Admission medications   Medication Sig Start Date End Date Taking? Authorizing Provider  acetaminophen (TYLENOL) 650 MG CR tablet Take 1,300 mg by mouth daily as needed for pain.    [provider]  amLODipine (NORVASC) 5 MG tablet Take 5 mg by mouth daily.    [provider]  aspirin 81 MG chewable tablet Chew 81 mg by mouth daily.    [provider]  Cholecalciferol (VITAMIN D3) 2000 units TABS Take 2,000 Units by mouth daily.    [provider]  diclofenac Sodium (VOLTAREN ARTHRITIS PAIN) 1 % GEL Apply 2 g topically as needed.    [provider]  hydrALAZINE (APRESOLINE) 25 MG tablet Take 1 tablet (25 mg total) by mouth 2 (two) times daily. Patient taking differently: Take 25 mg by mouth daily. 02/07/14   Elgergawy, Leana Roe, MD  lidocaine (LIDODERM) 5 % Place 1 patch onto the skin daily.    [provider]  loratadine (CLARITIN) 10 MG tablet Take 10 mg by mouth daily.    [provider]  Multiple Vitamin (MULTIVITAMIN ADULT PO) Take 1 tablet by mouth daily.    [provider]  naltrexone (DEPADE) 50 MG tablet Take 50 mg by mouth daily.    [provider]  Rivaroxaban (XARELTO) 15 MG TABS tablet Take 15 mg by mouth daily.    [provider]  rosuvastatin (CRESTOR) 20 MG tablet Take 1.5 tablets by mouth daily.    [provider]  sildenafil (VIAGRA) 100 MG tablet Take 1.5 tablets by mouth as needed for erectile dysfunction.    [provider]  tamsulosin  (FLOMAX) 0.4 MG CAPS capsule Take 0.4 mg by mouth daily. For fluid     [provider]  thiamine (VITAMIN B-1) 100 MG tablet Take 100 mg by mouth daily.    [provider]  traZODone (DESYREL) 50 MG tablet Take 50 mg by mouth at bedtime as needed for sleep.    [provider]      Allergies    Patient has no known allergies.    Review of Systems   Review of Systems  Physical Exam Updated Vital Signs BP (!) 135/103 (BP Location: Left Arm)   Pulse 71   Temp 97.7 F (36.5 C)   Resp 17   Ht 6\' 3"  (1.905 m)   Wt 101.6 kg   SpO2 100%   BMI 28.00 kg/m  Physical Exam  ED Results / Procedures / Treatments   Labs (all labs ordered are listed, but only abnormal results are displayed) Labs Reviewed  PROTIME-INR - Abnormal; Notable for the following components:      Result Value   Prothrombin Time 15.7 (*)    All other components within normal limits  CBC - Abnormal; Notable for the following components:   Platelets 121 (*)    All other components within normal limits  I-STAT CHEM 8, ED - Abnormal; Notable for the following components:   Creatinine, Ser 1.40 (*)    Glucose, Bld 102 (*)    Calcium,  Ion 1.12 (*)    TCO2 20 (*)    All other components within normal limits  CBG MONITORING, ED - Abnormal; Notable for the following components:   Glucose-Capillary 114 (*)    All other components within normal limits  MRSA NEXT GEN BY PCR, NASAL  ETHANOL  APTT  DIFFERENTIAL  RAPID URINE DRUG SCREEN, HOSP PERFORMED  URINALYSIS, ROUTINE W REFLEX MICROSCOPIC  HIV ANTIBODY (ROUTINE TESTING W REFLEX)  HEMOGLOBIN A1C    EKG None  Radiology CT C-SPINE NO CHARGE  Result Date: 10/22/2022 CLINICAL DATA:  Code stroke EXAM: CT CERVICAL SPINE WITHOUT CONTRAST TECHNIQUE: Multidetector CT imaging of the cervical spine was performed without intravenous contrast. Multiplanar CT image reconstructions were also generated. RADIATION DOSE REDUCTION: This exam was  performed according to the departmental dose-optimization program which includes automated exposure control, adjustment of the mA and/or kV according to patient size and/or use of iterative reconstruction technique. COMPARISON:  Cervical spine CT 02/09/2021 FINDINGS: Alignment: Normal. There is no jumped or perched facet or other evidence of traumatic malalignment. Skull base and vertebrae: Skull base alignment is maintained. Vertebral body heights are preserved. There is no evidence of acute fracture. There are bulky anterior osteophytes throughout the cervical spine. Soft tissues and spinal canal: No prevertebral fluid or swelling. No visible canal hematoma. Disc levels: Bulky anterior osteophytes throughout the cervical spine are again seen. 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 Upper chest: The imaged lung apices are clear. Other: None. IMPRESSION: No acute fracture or traumatic malalignment of the cervical spine Electronically Signed   By: Lesia Hausen M.D.   On: 10/22/2022 11:12   CT ANGIO HEAD NECK W WO CM W PERF (CODE STROKE)  Result Date: 10/22/2022 CLINICAL DATA:  Code stroke EXAM: CT ANGIOGRAPHY HEAD AND NECK CT PERFUSION BRAIN TECHNIQUE: Multidetector CT imaging of the head and neck was performed using the standard protocol during bolus administration of intravenous contrast. Multiplanar CT image reconstructions and MIPs were obtained to evaluate the vascular anatomy. Carotid stenosis measurements (when applicable) are obtained utilizing NASCET criteria, using the distal internal carotid diameter as the denominator. Multiphase CT imaging of the brain was performed following IV bolus contrast injection. Subsequent parametric perfusion maps were calculated using RAPID software. RADIATION DOSE REDUCTION: This exam was performed according to the departmental dose-optimization program which includes automated exposure  control, adjustment of the mA and/or kV according to patient size and/or use of iterative reconstruction technique. CONTRAST:  OMNIPAQUE IOHEXOL 350 MG/ML SOLN COMPARISON:  None Available. Same-day noncontrast CT head FINDINGS: CTA NECK FINDINGS Aortic arch: The imaged aortic arch is normal. The origins of the major branch vessels are patent. The subclavian arteries are patent to the level imaged. Right carotid system: The right common, internal, and external carotid arteries are patent, without hemodynamically significant stenosis or occlusion there is no evidence of dissection or aneurysm. Left carotid system: The left common, internal, and external carotid arteries are patent, with minimal plaque at the bifurcation but no hemodynamically significant stenosis or occlusion. There is no evidence of dissection or aneurysm. Vertebral arteries: The vertebral arteries are patent, without hemodynamically significant stenosis or occlusion there is no evidence of dissection or aneurysm. Skeleton: The cervical spine is assessed in full on the separately dictated cervical spine CT. Other neck: The soft tissues of the neck are unremarkable. Upper chest: The imaged lung apices are clear. Review of the MIP images  confirms the above findings CTA HEAD FINDINGS Anterior circulation: The intracranial ICAs are patent, with mild stenosis of the communicating segment on the right. There is acute occlusion of the distal left M1 segment with some reconstitution of flow in distal branches. The right M1 segment and distal branches are patent The bilateral ACAS are patent, without proximal stenosis or occlusion. The right A1 segment is hypoplastic. The anterior communicating artery is normal. There is no aneurysm or AVM. Posterior circulation: The bilateral V4 segments are patent. The basilar artery is patent. The major cerebellar arteries appear patent. The bilateral PCAs are patent, without proximal stenosis or occlusion. There is  a diminutive left posterior communicating artery with a probable infundibular origin. No definite right posterior communicating artery is identified. There is no aneurysm or AVM. Venous sinuses: Patent. Anatomic variants: As above. Review of the MIP images confirms the above findings CT Brain Perfusion Findings: ASPECTS: 9 CBF (<30%) Volume: 0mL Perfusion (Tmax>6.0s) volume: 65mL Mismatch Volume: 65mL Infarction Location:The penumbra is identified in the left MCA distribution. IMPRESSION: 1. Acute left M1 occlusion with some reconstitution of flow in distal branches. 2. 65 cc ischemic penumbra in the MCA distribution. No infarct core identified. 3. No other hemodynamically significant stenosis or occlusion in the vasculature of the head or neck. These results were called by telephone at the time of interpretation on 10/22/2022 at 10:56 am to provider Dr Trilby Leaver, who verbally acknowledged these results. Electronically Signed   By: Lesia Hausen M.D.   On: 10/22/2022 11:08   CT HEAD CODE STROKE WO CONTRAST  Result Date: 10/22/2022 CLINICAL DATA:  Code stroke.  No localizing sign provided EXAM: CT HEAD WITHOUT CONTRAST TECHNIQUE: Contiguous axial images were obtained from the base of the skull through the vertex without intravenous contrast. RADIATION DOSE REDUCTION: This exam was performed according to the departmental dose-optimization program which includes automated exposure control, adjustment of the mA and/or kV according to patient size and/or use of iterative reconstruction technique. COMPARISON:  CT Head 02/09/21 FINDINGS: Brain: Redemonstrated is a chronic left MCA territory infarct involving the insula and anterior temporal lobe. Compared to prior exam there is new hypodensity in the left basal ganglia (series 2, image 20). No evidence of hemorrhage, hydrocephalus, extra-axial collection or mass lesion/mass effect. Vascular: No hyperdense vessel or unexpected calcification. Skull: Normal. Negative for  fracture or focal lesion. Sinuses/Orbits: No middle ear effusion. Trace left mastoid effusion. Paranasal sinuses are clear. Right lens replacement and a right scleral band. Orbits are otherwise unremarkable. Other: None. ASPECTS [facility] Stroke Program Early CT Score): 9 when accounting for chronic findings. IMPRESSION: 1. Possible new age indeterminate infarcts in the left basal ganglia. Aspects is 9 when accounting for chronic findings. 2. Chronic left MCA territory infarct. Findings were paged to Dr. Viviann Spare on 10/22/2022 at 10:31 a.m. Electronically Signed   By: Lorenza Cambridge M.D.   On: 10/22/2022 10:31    Procedures Procedures  {Document cardiac monitor, telemetry assessment procedure when appropriate:1}  Medications Ordered in ED Medications   stroke: early stages of recovery book (has no administration in time range)  0.9 %  sodium chloride infusion ( Intravenous New Bag/Given 10/22/22 1520)  acetaminophen (TYLENOL) tablet 650 mg (has no administration in time range)    Or  acetaminophen (TYLENOL) 160 MG/5ML solution 650 mg (has no administration in time range)    Or  acetaminophen (TYLENOL) suppository 650 mg (has no administration in time range)  senna-docusate (Senokot-S) tablet 1 tablet (has no administration in  time range)  clevidipine (CLEVIPREX) infusion 0.5 mg/mL (2 mg/hr Intravenous IV Pump Association 10/22/22 1549)  0.9 %  sodium chloride infusion ( Intravenous Rate/Dose Change 10/22/22 1549)  Chlorhexidine Gluconate Cloth 2 % PADS 6 each (6 each Topical Given 10/22/22 1530)  Oral care mouth rinse (has no administration in time range)  iohexol (OMNIPAQUE) 350 MG/ML injection 100 mL (100 mLs Intravenous Contrast Given 10/22/22 1053)  iohexol (OMNIPAQUE) 300 MG/ML solution 150 mL (20 mLs Intra-arterial Contrast Given 10/22/22 1416)  nitroGLYCERIN 25 mcg in sodium chloride (PF) 0.9 % 59.71 mL (25 mcg/mL) syringe (25 mcg Intra-arterial Given 10/22/22 1209)  nitroGLYCERIN 25 mcg in sodium  chloride (PF) 0.9 % 59.71 mL (25 mcg/mL) syringe (25 mcg Intra-arterial Given 10/22/22 1211)  iohexol (OMNIPAQUE) 300 MG/ML solution 150 mL (80 mLs Intra-arterial Contrast Given 10/22/22 1416)  nitroGLYCERIN 25 mcg in sodium chloride (PF) 0.9 % 59.71 mL (25 mcg/mL) syringe (25 mcg Intra-arterial Given 10/22/22 1252)    ED Course/ Medical Decision Making/ A&P   {   Click here for ABCD2, HEART and other calculatorsREFRESH Note before signing :1}                              Medical Decision Making Amount and/or Complexity of Data Reviewed Labs: ordered. Radiology: ordered.  Risk Decision regarding hospitalization.   ***  {Document critical care time when appropriate:1} {Document review of labs and clinical decision tools ie heart score, Chads2Vasc2 etc:1}  {Document your independent review of radiology images, and any outside records:1} {Document your discussion with family members, caretakers, and with consultants:1} {Document social determinants of health affecting pt's care:1} {Document your decision making why or why not admission, treatments were needed:1} Final Clinical Impression(s) / ED Diagnoses Final diagnoses:  Acute ischemic stroke [facility])    Rx / DC Orders ED Discharge Orders     None

## 2022-10-22 NOTE — Anesthesia Postprocedure Evaluation (Signed)
Anesthesia Post Note  Patient: Frank Barron  Procedure(s) Performed: IR WITH ANESTHESIA     Patient location during evaluation: PACU Anesthesia Type: General Level of consciousness: awake and alert Pain management: pain level controlled Vital Signs Assessment: post-procedure vital signs reviewed and stable Respiratory status: spontaneous breathing, nonlabored ventilation, respiratory function stable and patient connected to nasal cannula oxygen Cardiovascular status: blood pressure returned to baseline and stable Postop Assessment: no apparent nausea or vomiting Anesthetic complications: no  No notable events documented.  Last Vitals:  Vitals:   10/22/22 1415 10/22/22 1430  BP: (!) 124/91 (!) 135/103  Pulse: 73 71  Resp: 20 17  Temp:  36.5 C  SpO2: 99% 100%    Last Pain:  Vitals:   10/22/22 1024  TempSrc:   PainSc: 0-No pain                 Shelton Silvas

## 2022-10-22 NOTE — Transfer of Care (Signed)
Immediate Anesthesia Transfer of Care Note  Patient: Frank Barron  Procedure(s) Performed: IR WITH ANESTHESIA  Patient Location: PACU  Anesthesia Type:General  Level of Consciousness: drowsy  Airway & Oxygen Therapy: Patient Spontanous Breathing and Patient connected to face mask oxygen  Post-op Assessment: Report given to RN and Post -op Vital signs reviewed and stable  Post vital signs: Reviewed and stable  Last Vitals:  Vitals Value Taken Time  BP 137/96 10/22/22 1401  Temp 36.3 C 10/22/22 1401  Pulse 78 10/22/22 1412  Resp 21 10/22/22 1412  SpO2 97 % 10/22/22 1410  Vitals shown include unfiled device data.  Last Pain:  Vitals:   10/22/22 1024  TempSrc:   PainSc: 0-No pain         Complications: No notable events documented.

## 2022-10-22 NOTE — ED Notes (Signed)
Taken to IR by stroke team.

## 2022-10-22 NOTE — Anesthesia Procedure Notes (Signed)
Procedure Name: Intubation Date/Time: 10/22/2022 11:21 AM  Performed by: Quentin Ore, CRNAPre-anesthesia Checklist: Patient identified, Emergency Drugs available, Suction available and Patient being monitored Patient Re-evaluated:Patient Re-evaluated prior to induction Oxygen Delivery Method: Circle system utilized Preoxygenation: Pre-oxygenation with 100% oxygen Induction Type: IV induction, Rapid sequence and Cricoid Pressure applied Laryngoscope Size: Mac and 4 Grade View: Grade I Tube type: Oral Tube size: 7.5 mm Number of attempts: 1 Airway Equipment and Method: Stylet Placement Confirmation: ETT inserted through vocal cords under direct vision, positive ETCO2 and breath sounds checked- equal and bilateral Secured at: 22 cm Tube secured with: Tape Dental Injury: Teeth and Oropharynx as per pre-operative assessment

## 2022-10-22 NOTE — Code Documentation (Signed)
Stroke Response Nurse Documentation Code Documentation  Frank Barron is a 72 y.o. 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 On Xarelto (rivaroxaban) daily. Reports he walks with a cane baseline.  Patient from home where he was LKW at 2100 10/21/2022. His wife found him on the floor this morning at 0500. He had right sided weakness and was incontinent of urine. Code stroke was activated by EMS.   Stroke team at the bedside on patient arrival. Labs drawn and patient cleared for CT by EDP. Patient to CT with team. NIHSS 16, see documentation for details and code stroke times. Patient with disoriented, left hemianopia, right facial droop, right arm weakness, right leg weakness, Expressive aphasia , dysarthria , and Visual  neglect on exam. The following imaging was completed:  CT Head and CTA. Patient is not a candidate for IV Thrombolytic due to out of the window. Patient is a candidate for IR due to M1 occlusion.   Care Plan: admit to ICU post IR procedure.   Bedside handoff with IR RN.    Ferman Hamming Stroke Response RN

## 2022-10-22 NOTE — ED Notes (Signed)
Lab called this RN to inform CMP hemolyzed, to be recollected when patient returns from IR

## 2022-10-22 NOTE — Anesthesia Preprocedure Evaluation (Signed)
Anesthesia Evaluation  Patient identified by MRN, date of birth, ID band Patient confused    Reviewed: Unable to perform ROS - Chart review onlyPreop documentation limited or incomplete due to emergent nature of procedure.  Airway Mallampati: II  TM Distance: >3 FB Neck ROM: Full    Dental  (+) Edentulous Upper, Dental Advisory Given   Pulmonary    breath sounds clear to auscultation       Cardiovascular hypertension, + CAD, + Cardiac Stents and +CHF  + dysrhythmias Atrial Fibrillation  Rhythm:Regular Rate:Normal     Neuro/Psych  PSYCHIATRIC DISORDERS Anxiety Depression    CVA    GI/Hepatic negative GI ROS,,,(+) Hepatitis -  Endo/Other  negative endocrine ROS    Renal/GU Renal disease     Musculoskeletal  (+) Arthritis ,    Abdominal   Peds  Hematology negative hematology ROS (+)   Anesthesia Other Findings   Reproductive/Obstetrics                              Anesthesia Physical Anesthesia Plan  ASA: 4 and emergent  Anesthesia Plan: General   Post-op Pain Management:    Induction: Intravenous  PONV Risk Score and Plan: 2 and Ondansetron and Treatment may vary due to age or medical condition  Airway Management Planned: Oral ETT  Additional Equipment: Arterial line  Intra-op Plan:   Post-operative Plan: Possible Post-op intubation/ventilation  Informed Consent:      Dental advisory given, Only emergency history available and History available from chart only  Plan Discussed with: CRNA  Anesthesia Plan Comments:          Anesthesia Quick Evaluation

## 2022-10-22 NOTE — Procedures (Signed)
INR.  Status post left common carotid arteriogram in left vertebral arteriogram.    Right CFA approach.  Findings. 1.  Occluded left MCA distal M1 segment.  2.   Severely attenuated caliber of  the superior and inferior divisions due top  ICAD intracranial arteriosclerosis.     Status post revascularization of the distal left MCA M1 segment, and partially the inferior division with 1 pass of, contact aspiration with 055 zoom aspiration catheter, 1 pass with 44 zoom contact, aspiration, and 1 pass with 038 Socrates aspiration catheter achieving TICI 2b revascularization.    Post CT of the brain demonstrates  mild subarachnoid hemorrhage in the distal left anterior parietal cortical region.  8 French Angio-Seal closure device deployed for hemostasis at the right groin puncture site.  Distal pulses bilaterally dopplerable DP's ,unchanged from prior to procedure.  Patient extubated.  Maintaining O2 saturations.  Pupils 2 mm bilaterally equal and sluggishly reactive.  No gross facial asymmetry noted.  Patient attempting to follow simple commands.  Not moving right arm and leg yet.  S. MD

## 2022-10-22 NOTE — H&P (Signed)
Neurology H&P  Reason for Consult: Code stroke, right-sided weakness Referring Physician: Dr. Earlene Plater  CC: Patient is unable to provide a chief complaint due to aphasia  History is obtained from: Chart review, patient's daughter via telephone, unable to obtain from patient due to aphasia  HPI: Frank Barron is a 72 y.o. 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  Patient lives at home with his wife and ambulates with a walker at baseline.  Patient was last seen in usual state of health prior to bed at 21:00 on 10/21/22.  [name] woke up at 5:00 this morning and noticed that patient was laying on the floor next to his recliner unable to get up with right-sided weakness.  On EMS arrival, patient was noted to have a flaccid right upper extremity and had been incontinent of urine while on the floor.  Patient reports that he has not been compliant with his Xarelto though patient's wife states that he has been taking it as prescribed and his pharmacy fill history supports compliance with this medication.  On arrival, patient was noted to have right-sided weakness, right-sided facial droop, dysarthria, aphasia and he was taken to CT for further evaluation.  At baseline, patient walks with a walker or a cane.  Only residual symptom reported from prior stroke includes aphasia.  Some documentation supports some minimal right sided motor apraxia.  Patient is able to complete his ADLs independently and manages his home medications with occasional help from his wife.  LKW: 10/21/22  at 21:00 TNK given?: no, patient on full anticoagulation at home on Xarelto.  Patient presents outside of the thrombolytic therapy time window. IR Thrombectomy?  Yes, imaging revealed left M1 occlusion, consent was obtained from patient's daughter via telephone Lina Sayre [phone] and IR was activated.  Patient's daughter is a PA in [place], [place]. Modified Rankin Scale: 3-Moderate disability-requires help but walks WITHOUT assistance  ROS: Unable to obtain due to aphasia  Past Medical History:  Diagnosis Date   Anemia    Anxiety    Arthritis    Atrial fibrillation (HCC)    CAD (coronary artery disease)    PCI to LAD 2012   CHF (congestive heart failure) (HCC)    Depression    [name] 07/19/2005 (09/30/2012)   Hepatitis C    pt denies this hx on 09/30/2012   High cholesterol    History of DVT of lower extremity    "left; long time ago" (09/30/2012)   Hypertension    Prostate cancer (HCC)    Renal cell carcinoma (HCC)    Stroke (HCC) 11/18/2011   [name] 07/31/2012; expressive aphasia noted on 09/30/2012   Past Surgical History:  Procedure Laterality Date   COLONOSCOPY N/A 10/01/2012   Procedure: COLONOSCOPY;  Surgeon: Louis Meckel, MD;  Location[facility] ENDOSCOPY;  Service: Endoscopy;  Laterality: N/A;   CORONARY ANGIOPLASTY WITH STENT PLACEMENT     ESOPHAGOGASTRODUODENOSCOPY N/A 10/01/2012   Procedure: ESOPHAGOGASTRODUODENOSCOPY (EGD);  Surgeon: Louis Meckel, MD;  Location: [facility] ENDOSCOPY;  Service: Endoscopy;  Laterality: N/A;   EYE SURGERY Right 1975   /notes 07/19/2005; "injured playing  football" (09/29/2012)   GIVENS CAPSULE STUDY N/A 10/02/2012   Procedure: GIVENS CAPSULE STUDY;  Surgeon: Louis Meckel, MD;  Location: [facility] ENDOSCOPY;  Service: Endoscopy;  Laterality: N/A;   JOINT REPLACEMENT  01/03/2007   LUMBAR DISC SURGERY  2001   herniated disc/notes 07/19/2005 (09/30/2012)   NEPHRECTOMY  12/26/2013   Laparascopic , [facility]   TOTAL HIP ARTHROPLASTY Left 01/03/2007    [name] 01/03/2007 (09/30/2012)   TOTAL KNEE ARTHROPLASTY Left 09/12/2014   Procedure: LEFT TOTAL KNEE ARTHROPLASTY;  Surgeon: Tarry Kos, MD;  Location: [facility] OR;  Service: Orthopedics;  Laterality: Left;   Family History  Problem Relation Age of Onset   Hypertension Mother    Deep vein thrombosis Mother    Cancer Neg Hx    Social History:   reports that he has never smoked. He has never used smokeless tobacco. He reports current alcohol use. He reports that he does not use drugs.  Medications  Current Facility-Administered Medications:    [START ON 10/23/2022]  stroke: early stages of recovery book, , Does not apply, Once, Toberman, Stevi W, NP   0.9 %  sodium chloride infusion, , Intravenous, Continuous, Toberman, Stevi W, NP   acetaminophen (TYLENOL) tablet 650 mg, 650 mg, Oral, Q4H PRN **OR** acetaminophen (TYLENOL) 160 MG/5ML solution 650 mg, 650 mg, Per Tube, Q4H PRN **OR** acetaminophen (TYLENOL) suppository 650 mg, 650 mg, Rectal, Q4H PRN, Cindie Laroche, Reyne Dumas, NP   senna-docusate (Senokot-S) tablet 1 tablet, 1 tablet, Oral, QHS PRN, Kara Mead, NP  Current Outpatient Medications:    acetaminophen (TYLENOL) 650 MG CR tablet, Take 1,300 mg by mouth daily as needed for pain., Disp: , Rfl:    amLODipine (NORVASC) 10 MG tablet, Take 10 mg by mouth daily., Disp: , Rfl:    aspirin 81 MG chewable tablet, Chew 81 mg by mouth daily., Disp: , Rfl:    Cholecalciferol (VITAMIN D3) 2000 units TABS, Take 2,000 Units by mouth daily., Disp: , Rfl:    hydrALAZINE (APRESOLINE) 25 MG tablet, Take 1 tablet (25 mg total) by mouth 2 (two) times daily. (Patient taking differently: Take 25 mg by mouth daily.), Disp: 60 tablet, Rfl: 0   lidocaine (LIDODERM) 5 %, Place 1 patch onto the skin daily., Disp: , Rfl:    Rivaroxaban (XARELTO) 15 MG TABS tablet, Take 15 mg by mouth daily., Disp: , Rfl:    sildenafil (VIAGRA) 100 MG tablet, Take 100 mg by mouth as needed for erectile dysfunction., Disp: , Rfl:     tamsulosin (FLOMAX) 0.4 MG CAPS capsule, Take 0.4 mg by mouth daily. For fluid , Disp: , Rfl:    thiamine (VITAMIN B-1) 100 MG tablet, Take 100 mg by mouth daily., Disp: , Rfl:   Exam: Current vital signs: BP (!) 165/133   Pulse 86   Temp 98.1 F (36.7 C) (Axillary)   Resp 12   Ht 6\' 3"  (1.905 m)   Wt 101.6 kg   SpO2 99%   BMI 28.00 kg/m  Vital signs in last 24 hours: Temp:  [98.1 F (36.7 C)] 98.1 F (36.7 C) (08/09 1023) Pulse Rate:  [80-86] 86 (08/09 1100) Resp:  [12-15] 12 (08/09 1100) BP: (165-188)/(133-148) 165/133 (08/09 1100) SpO2:  [99 %-100 %] 99 % (08/09 1100) Weight:  [101.6 kg] 101.6 kg (08/09 1000)  GENERAL: Awake, alert, in no acute distress Psych: Patient is aphasic, but remains calm throughout exam Head: Normocephalic and atraumatic, without obvious abnormality EENT: Normal conjunctivae, disconjugate gaze (baseline)  LUNGS: Normal respiratory effort. Non-labored breathing on room air CV: Irregular rate and rhythm on telemetry, atrial fibrillation ABDOMEN: Soft, non-tender, non-distended Extremities: Warm, well perfused, without obvious deformity  NEURO:  Mental Status: Awake, alert, when asked his name he states "[place]" with severe dysarthria. He does answer "no" to this examiner questions that this does not appear to be reliable. Patient does not follow commands. Patient is severely dysarthric and aphasic. Patient is unable to state his age or the current month.  Patient's verbalization is largely unintelligible when he attempts (may have reported that his age is 72 years old) Cranial Nerves:  II: PERRL.  III, IV, VI: EOMI with dysconjugate gaze (bilateral eyes up and out when contralateral eye is midline) V: Sensation is intact to light touch and symmetrical to face. Blinks to threat.  VII: Right facial weakness, right NLF flattening VIII: Hearing intact to voice IX, X: Phonation normal XI: Head is grossly midline XII: Does not protrude tongue  to command Motor: Flaccid right upper extremity, right lower extremity moves minimally without gravity. Left upper and lower extremity are able to elevate antigravity without vertical drift. Tone is normal. Bulk is normal.  Sensation: Patient states "ouch" with application of light noxious stimuli throughout Coordination: Does not perform Gait: Deferred  NIHSS: 1a Level of Conscious.: 0 1b LOC Questions: 2 1c LOC Commands: 2 2 Best Gaze: 0 3 Visual: 0 4 Facial Palsy: 2 5a Motor Arm - left: 0 5b Motor Arm - Right: 4 6a Motor Leg - Left: 0 6b Motor Leg - Right: 3 7 Limb Ataxia: 0 8 Sensory: 0 9 Best Language: 2 10 Dysarthria: 2 11 Extinct. and Inatten.: 0 TOTAL: 17  Labs I have reviewed labs in epic and the results pertinent to this consultation are: CBC    Component Value Date/Time   WBC 4.8 10/22/2022 1016   RBC 5.05 10/22/2022 1016   HGB 15.3 10/22/2022 1026   HCT 45.0 10/22/2022 1026   PLT 121 (L) 10/22/2022 1016   MCV 89.1 10/22/2022 1016   MCH 29.7 10/22/2022 1016   MCHC 33.3 10/22/2022 1016   RDW 14.5 10/22/2022 1016   LYMPHSABS 1.0 10/22/2022 1016   MONOABS 0.4 10/22/2022 1016   EOSABS 0.0 10/22/2022 1016   BASOSABS 0.0 10/22/2022 1016   CMP     Component Value Date/Time   NA 138 10/22/2022 1026   K 4.3 10/22/2022 1026   CL 108 10/22/2022 1026   CO2 23 10/14/2022 0848   GLUCOSE 102 (H) 10/22/2022 1026   BUN 20 10/22/2022 1026   CREATININE 1.40 (H) 10/22/2022 1026   CALCIUM 9.6 10/14/2022 0848   PROT 6.9 01/01/2016 1254   ALBUMIN 3.2 (L) 01/01/2016 1254   AST 30 01/01/2016 1254   ALT 21 01/01/2016 1254   ALKPHOS 59 01/01/2016 1254   BILITOT 2.1 (H) 01/01/2016 1254   GFRNONAA 58 (L) 10/14/2022 0848   GFRAA 52 (L) 01/03/2016 0616   Lipid Panel  No results found for: "CHOL", "TRIG", "HDL", "CHOLHDL", "VLDL", "LDLCALC", "LDLDIRECT" No results found for: "HGBA1C"  Imaging I have reviewed the images obtained: CT head 10/22/22: 1. Possible new age  indeterminate infarcts in the left basalganglia. Aspects is 9 when accounting for chronic findings. 2. Chronic left MCA territory infarct.   CT angio head and neck + CT cerebral perfusion 10/22/22: 1. Acute left M1 occlusion with some reconstitution of flow in distal branches. 2. 65 cc ischemic penumbra in the MCA distribution. No infarct core identified. 3.  No other hemodynamically significant stenosis or occlusion in the vasculature of the head or neck.   Assessment: 72 year old male with PMHx of AF on Xarelto, CAD/MI s/p PCI 2012, CHF, remote DVT, HTN, RCC, prostate cancer, left temporal stroke in 2013 with residual aphasia, and drug use who presents to the ED on 10/22/2022 via EMS for evaluation of acute onset of right-sided weakness.  Patient was in his usual state of health last night before bed 10/21/2022 at 2100.  At 5:00 this morning, patient's wife found him laying beside in his recliner with right-sided weakness.  On arrival to the ED, patient was found to have right-sided weakness, aphasia, dysarthria.  Imaging was obtained revealing an acute left M1 occlusion.  Patient was not a TNK candidate as he presented outside of the thrombolytic therapy time window and he is on full anticoagulation with Xarelto at home.  Patient was taken to IR for further intervention.  Plan: Acute Ischemic Stroke Cerebral infarction due to embolism of left middle cerebral artery   Acuity: Acute Current Suspected Etiology: Likely cardioembolic Continue Evaluation:  -Admit to: ICU -Continue Statin -Blood pressure control, goal per IR -Prophylactic therapy per IR -MRI/ECHO/A1C/Lipid panel. -Hyperglycemia management per SSI to maintain glucose 140-180mg /dL. -PT/OT/ST therapies and recommendations when able  CNS Acute ischemic stroke -Close neuro monitoring  Dysarthria Dysphagia following cerebral infarction  -NPO until cleared by speech -ST -Advance diet as tolerated  Hemiplegia and hemiparesis  following cerebral infarction affecting right dominant side -PT/OT  RESP Mechanical ventilation for IR procedure -Possible extubation postprocedure -vent management per ICU if patient remains intubated -wean when able -Supplemental oxygen for SpO2 > 92%  CV Essential (primary) hypertension -Aggressive BP control, goal per IR -Titrate oral agents -As needed labetalol and Cleviprex as needed  Heart failure, unspecified -TTE  Hyperlipidemia, unspecified  - Statin for goal LDL < 70  Paroxysmal atrial fibrillation -Rate control  HEME AM CBC -Monitor H&H -Transfuse for hgb < 7  GI/GU CKD Stage 3 (GFR 30-59) -Gentle hydration -avoid nephrotoxic agents  Fluid/Electrolyte Disorders AM CMP -Replete -Repeat labs -Trend  ID Trend WBC and fever curve  Prophylaxis DVT:  SCDs GI: PPI Bowel: Docusate / Senna  Diet: NPO until cleared by speech  Code Status: Full Code    THE FOLLOWING WERE PRESENT ON ADMISSION: CNS -  Acute Ischemic Stroke, Hemiparesis, Hemiplegia,  Cardiovascular - Chronic CHF, Cardiac Arrhythmia Renal -  CKD Cancer - Remote prostate cancer, RCC Trauma - Unwitnessed fall at home  Lanae Boast, AGAC-NP [facility] Pager: [phone]  ATTENDING ATTESTATION:  72 year old history of atrial fibrillation on Xarelto compliant with the medication per pharmacy check.  He also had his weekly pill bottle with him for [place] to review.  His wife went to bed around 9 PM but he stayed up to watch TV.  She found him around 5 in the morning on the ground for unknown duration of time.  Unclear why there is a delay in her activating 9 1.  Not a candidate for TN K due to anticoagulation use but did have left M1 occlusion code IR activated.  Consent was obtained from patient's daughter and witnessed by APP for the thrombectomy.  Discussed risk of bleeding, subarachnoid hemorrhage that could be fatal as well as possibility that stent may be required.  She  understands the risks and asked to proceed with the procedure.  Admitted to the ICU.  Close observation and neuromonitoring as above  Dr. Viviann Spare evaluated pt independently, reviewed imaging,  chart, labs. Discussed and formulated plan with the Resident/APP. Changes were made to the note where appropriate. Please see APP/resident note above for details.    This patient is critically ill due to acute stroke TNK and at significant risk of neurological worsening, death form heart failure, respiratory failure, recurrent stroke, bleeding from [facility], seizure, sepsis. This patient's care requires constant monitoring of vital signs, hemodynamics, respiratory and cardiac monitoring, review of multiple databases, neurological assessment, discussion with family, other specialists and medical decision making of high complexity. I spent 70 minutes of neurocritical care time in the care of this patient.    ,MD

## 2022-10-22 NOTE — Progress Notes (Signed)
PT Cancellation Note  Patient Details Name: Frank Barron MRN: [id number] DOB: 1951-01-14   Cancelled Treatment:    Reason Eval/Treat Not Completed: (P) Active bedrest order. Will plan to follow-up another day as activity orders are progressed.   Raymond Gurney, PT, DPT Acute Rehabilitation Services  Office: [phone]    [name] 10/22/2022, 2:59 PM

## 2022-10-23 ENCOUNTER — Inpatient Hospital Stay (HOSPITAL_COMMUNITY): Payer: No Typology Code available for payment source

## 2022-10-23 DIAGNOSIS — I6602 Occlusion and stenosis of left middle cerebral artery: Secondary | ICD-10-CM | POA: Diagnosis not present

## 2022-10-23 DIAGNOSIS — I639 Cerebral infarction, unspecified: Secondary | ICD-10-CM | POA: Diagnosis not present

## 2022-10-23 MED ORDER — RIVAROXABAN 15 MG PO TABS
15.0000 mg | ORAL_TABLET | Freq: Every day | ORAL | Status: DC
  Administered 2022-10-23 – 2022-10-25 (×3): 15 mg via ORAL
  Filled 2022-10-23 (×3): qty 1

## 2022-10-23 MED ORDER — HYDRALAZINE HCL 20 MG/ML IJ SOLN: 10.0000 mg | Freq: Four times a day (QID) | INTRAMUSCULAR | Status: DC | PRN

## 2022-10-23 MED ORDER — THIAMINE MONONITRATE 100 MG PO TABS
100.0000 mg | ORAL_TABLET | Freq: Every day | ORAL | Status: DC
  Administered 2022-10-23 – 2022-10-25 (×3): 100 mg via ORAL
  Filled 2022-10-23 (×4): qty 1

## 2022-10-23 MED ORDER — LABETALOL HCL 5 MG/ML IV SOLN: 10.0000 mg | INTRAVENOUS | Status: DC | PRN

## 2022-10-23 MED ORDER — ROSUVASTATIN CALCIUM 20 MG PO TABS
40.0000 mg | ORAL_TABLET | Freq: Every day | ORAL | Status: DC
  Administered 2022-10-23 – 2022-10-25 (×3): 40 mg via ORAL
  Filled 2022-10-23 (×4): qty 2

## 2022-10-23 MED ORDER — AMLODIPINE BESYLATE 10 MG PO TABS
10.0000 mg | ORAL_TABLET | Freq: Every day | ORAL | Status: DC
  Administered 2022-10-23 – 2022-10-25 (×3): 10 mg via ORAL
  Filled 2022-10-23 (×4): qty 1

## 2022-10-23 MED ORDER — TAMSULOSIN HCL 0.4 MG PO CAPS
0.4000 mg | ORAL_CAPSULE | Freq: Every day | ORAL | Status: DC
  Administered 2022-10-23 – 2022-10-25 (×3): 0.4 mg via ORAL
  Filled 2022-10-23 (×4): qty 1

## 2022-10-23 MED ORDER — PERFLUTREN LIPID MICROSPHERE
1.0000 mL | INTRAVENOUS | Status: AC | PRN
  Administered 2022-10-23: 3 mL via INTRAVENOUS

## 2022-10-23 NOTE — Progress Notes (Addendum)
STROKE TEAM PROGRESS NOTE   BRIEF HPI [name] is a 72 y.o. 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  ambulates with a walker at baseline.  S/p mechanical thrombectomy of left M1 with TICI 2c  SIGNIFICANT HOSPITAL EVENTS 8/9 mechanical thrombectomy of left M1 with TICI 2c  INTERIM HISTORY/SUBJECTIVE Patient is awake and alert in no apparent distress.  His brother is at the bedside. He is on 2 mg IV Cleviprex.  His exam he has right facial droop expressive aphasia and dysarthric, can follow commands, with right side hemiparesis 2/5 and increased tone He passed his swallow so we will resume home meds  OBJECTIVE  CBC    Component Value Date/Time   WBC 5.9 10/23/2022 0618   RBC 4.32 10/23/2022 0618   HGB 13.1 10/23/2022 0618   HCT 37.9 (L) 10/23/2022 0618   PLT 127 (L) 10/23/2022 0618   MCV 87.7 10/23/2022 0618   MCH 30.3 10/23/2022 0618   MCHC 34.6 10/23/2022 0618   RDW 14.6 10/23/2022 0618   LYMPHSABS 0.8 10/23/2022 0618   MONOABS 0.6 10/23/2022 0618   EOSABS 0.0 10/23/2022 0618   BASOSABS 0.0 10/23/2022 0618    BMET    Component Value Date/Time   NA 135 10/23/2022 0618   K 4.7 10/23/2022 0618   CL 102 10/23/2022 0618   CO2 20 (L) 10/23/2022 0618   GLUCOSE 105 (H) 10/23/2022 0618   BUN 15 10/23/2022 0618   CREATININE 1.36 (H) 10/23/2022 0618   CALCIUM 8.6 (L) 10/23/2022 0618   GFRNONAA 56 (L) 10/23/2022 0618    IMAGING past 24 hours MR BRAIN WO CONTRAST  Result Date: 10/23/2022 CLINICAL DATA:  72 year old male code stroke presentation yesterday.  Acute left M1 occlusion. Status post endovascular revascularization. Mild subarachnoid hemorrhage suspected postprocedural. EXAM: MRI HEAD WITHOUT CONTRAST TECHNIQUE: Multiplanar, multiecho pulse sequences of the brain and surrounding structures were obtained without intravenous contrast. COMPARISON:  CTA, CT head, and CTP yesterday. No previous brain MRI. FINDINGS: Brain: Confluent restricted diffusion throughout the left basal ganglia (series 5, image 77) and patchy additional scattered cortical and white matter diffusion restriction, mostly in the middle sylvian division. Underlying chronic left MCA territory infarct with encephalomalacia mostly in the posterior division. Cytotoxic edema in the affected areas.Blood products on SWI which seem more related with the chronic encephalomalacia than acutely within subarachnoid spaces. And no IVH is identified. No midline shift. Only mild mass effect on the left lateral ventricle from the left basal ganglia. No posterior fossa diffusion restriction, but there is punctate restricted diffusion in the contralateral right caudate (series 7, image 56). Small chronic right cerebellar infarct. Cervicomedullary junction and pituitary are within normal limits. Vascular: Major intracranial vascular flow voids are preserved proximally, although the left MCA appears highly asymmetric on both T2 and FLAIR imaging suggesting abnormal flow persists (series 11, image 22). Skull and upper cervical spine: Visualized bone marrow signal is within normal limits. Negative for age visible cervical spine. Sinuses/Orbits: Postoperative changes to the right globe. Paranasal Visualized paranasal sinuses and mastoids are stable and well aerated. Other: Visible internal auditory structures appear normal. IMPRESSION: 1.  Evidence of ongoing abnormal flow in the Left MCA on this routine noncontrast exam. 2. Left MCA territory acute infarcts most confluent in the left basal ganglia. No significant  subarachnoid blood by MRI, head CT would be more sensitive. But chronic left MCA territory encephalomalacia with abundant hemosiderin. No significant intracranial mass effect. 3. Superimposed punctate acute infarct in the contralateral right caudate. Electronically Signed   By: Odessa Fleming M.D.   On: 10/23/2022 04:59    Vitals:   10/23/22 1000 10/23/22 1015 10/23/22 1030 10/23/22 1134  BP:      Pulse: 70 72 67   Resp: 15 16 (!) 21   Temp:    (!) 97.3 F (36.3 C)  TempSrc:    Axillary  SpO2: 99% 100% 98%   Weight:      Height:         PHYSICAL EXAM General:  Alert, well-nourished, well-developed middle-aged African-American male in no acute distress Psych:  Mood and affect appropriate for situation CV: Regular rate and rhythm on monitor Respiratory:  Regular, unlabored respirations on room air GI: Abdomen soft and nontender   NEURO:  Mental Status: AA&Ox3, expressive aphasia can follow simple midline commands only.  Impaired naming repetition Speech/Language: speech is with dysarthria    Cranial Nerves:  II: PERRL.  Decreased blink to threat on the right III, IV, VI: EOMI. Eyelids elevate symmetrically.  V: Sensation is intact to light touch and symmetrical to face.  VII: Right facial droop VIII: hearing intact to voice. IX, X: Palate elevates symmetrically. Phonation is normal.  GN:FAOZHYQM shrug 5/5. XII: tongue is midline without fasciculations. Motor: Right hemiparesis right arm and right leg 2/5 with increased tone and decreased right grip Tone: is normal and bulk is normal Sensation- Intact to light touch bilaterally. Extinction absent to light touch to DSS.   Coordination: FTN intact bilaterally, HKS: no ataxia in BLE.No drift.  Gait- deferred   ASSESSMENT/PLAN  Acute Ischemic Infarct:  left MCA s/p mechanical thrombectomy of left M1 with TICI 2c Etiology: Cardioembolic in the setting of A-fib on Xarelto-unsure about compliance Code Stroke  CT head Possible new age  indeterminate infarcts in the left basal ganglia. ASPECTS 9   CTA head & neck Acute left M1 occlusion with some reconstitution of flow in distal branches. CT perfusion  65 cc ischemic penumbra in the MCA distribution. No infarct core identified. Post IR CT mild subarachnoid hemorrhage in the distal left anterior parietal cortical region. MRI  Left MCA territory acute infarcts most confluent in the left basal ganglia. 2D Echo pending LDL 92 HgbA1c 5.2 VTE prophylaxis -SCDs Xarelto (rivaroxaban) daily prior to admission, now on Xarelto (rivaroxaban) daily  Therapy recommendations: Pending Disposition: Pending  Hx of Stroke/TIA With residual aphasia  stroke in 2013  Atrial fibrillation Home Meds: Xarelto Continue telemetry monitoring Restart Xarelto today  Hypertension Hypertensive emergency Home meds: Amlodipine 5 mg, hydralazine 25 mg twice daily UnStable Currently on Cleviprex at 2 mg, titrate as possible Added home amlodipine 10 mg Blood Pressure Goal: SBP 120-160 for first 24 hours then less than 180   Hyperlipidemia Home meds: Crestor 30 mg, increased to 40 LDL 92, goal < 70 Increase to 40 mg Continue statin at discharge  Diabetes type II Controlled Home meds: None HgbA1c 5.2, goal < 7.0 CBGs   Substance Abuse Patient uses cocaine UDS positive for Cocaine      Ready to quit? N/A TOC consult for cessation placed  Dysphagia Patient has post-stroke dysphagia, SLP  consulted    Diet   DIET DYS 2 Room service appropriate? No; Fluid consistency: Thin   Advance diet as tolerated  History of DVT Noted  Other Stroke Risk Factors  ETOH use, alcohol level <10, advised to drink no more than 2 drink(s) a day Coronary artery disease Congestive heart failure   Other Active Problems History of renal and prostate cancer-resume home Flomax  Hospital day # 1  STROKE MD NOTE :  I have personally obtained history,examined this patient, reviewed notes,  independently viewed imaging studies, participated in medical decision making and plan of care.ROS completed by me personally and pertinent positives fully documented  I have made any additions or clarifications directly to the above note. Agree with note above.  Patient with A-fib on Xarelto but not sure about compliance presented with aphasia and right hemiparesis with distal left M1 occlusion and underwent mechanical thrombectomy of occluded distal left M1 with underlying severely attenuated caliber of the superior inferior divisions of MCA due to intracranial atherosclerosis.  Patient remains with significant expressive aphasia dysarthria and moderate right hemiparesis.  Recommend close neurological observation and strict blood pressure control as per post thrombectomy protocol.  Speech therapy for swallow eval.  Resume anticoagulation when patient able to swallow.  Long discussion with brother at the bedside and answered questions.  Discussed with Dr. Corliss Skains neurointerventional radiologist.This patient is critically ill and at significant risk of neurological worsening, death and care requires constant monitoring of vital signs, hemodynamics,respiratory and cardiac monitoring, extensive review of multiple databases, frequent neurological assessment, discussion with family, other specialists and medical decision making of high complexity.I have made any additions or clarifications directly to the above note.This critical care time does not reflect procedure time, or teaching time or supervisory time of PA/NP/Med Resident etc but could involve care discussion time.  I spent 30 minutes of neurocritical care time  in the care of  this patient.      Delia Heady, MD Medical Director [facility] Pager: [phone] 10/23/2022 1:01 PM   To contact Stroke Continuity provider, please refer to [email or web address]. After hours, contact General Neurology

## 2022-10-23 NOTE — Progress Notes (Signed)
OT Cancellation Note  Patient Details Name: Frank Barron MRN: [id number] DOB: 07-21-1950   Cancelled Treatment:    Reason Eval/Treat Not Completed: Active bedrest order  [name] 10/23/2022, 7:09 AM

## 2022-10-23 NOTE — Plan of Care (Signed)
MRI brain reveals new completed stroke in left basal ganglia and abnormal FLAIR signal in the proximal left MCA suggestive of slow flow versus occlusion. Not a candidate for repeated attempt at mechanical revascularization due to unacceptable risk of hemorrhagic transformation secondary to luxury perfusion.   Electronically signed: Dr. Caryl Pina

## 2022-10-23 NOTE — Evaluation (Signed)
Clinical/Bedside Swallow Evaluation Patient Details  Name: Frank Barron MRN: [id number] Date of Birth: 09-22-1950  Today's Date: 10/23/2022 Time: SLP Start Time (ACUTE ONLY): 1126 SLP Stop Time (ACUTE ONLY): 1137 SLP Time Calculation (min) (ACUTE ONLY): 11 min  Past Medical History:  Past Medical History:  Diagnosis Date   Anemia    Anxiety    Arthritis    Atrial fibrillation (HCC)    CAD (coronary artery disease)    PCI to LAD 2012   CHF (congestive heart failure) (HCC)    Depression    [name] 07/19/2005 (09/30/2012)   Hepatitis C    pt denies this hx on 09/30/2012   High cholesterol    History of DVT of lower extremity    "left; long time ago" (09/30/2012)   Hypertension    Prostate cancer (HCC)    Renal cell carcinoma (HCC)    Stroke (HCC) 11/18/2011   [name] 07/31/2012; expressive aphasia noted on 09/30/2012   Past Surgical History:  Past Surgical History:  Procedure Laterality Date   COLONOSCOPY N/A 10/01/2012   Procedure: COLONOSCOPY;  Surgeon: Louis Meckel, MD;  Location: [facility] ENDOSCOPY;  Service: Endoscopy;  Laterality: N/A;   CORONARY ANGIOPLASTY WITH STENT PLACEMENT     ESOPHAGOGASTRODUODENOSCOPY N/A 10/01/2012   Procedure: ESOPHAGOGASTRODUODENOSCOPY (EGD);  Surgeon: Louis Meckel, MD;  Location: [facility] ENDOSCOPY;  Service: Endoscopy;  Laterality: N/A;   EYE SURGERY Right 1975   /notes 07/19/2005; "injured playing football" (09/29/2012)   GIVENS CAPSULE STUDY N/A 10/02/2012   Procedure: GIVENS CAPSULE STUDY;  Surgeon: Louis Meckel, MD;  Location: [facility] ENDOSCOPY;  Service: Endoscopy;  Laterality: N/A;   JOINT REPLACEMENT  01/03/2007   LUMBAR DISC SURGERY  2001   herniated disc/notes 07/19/2005 (09/30/2012)   NEPHRECTOMY  12/26/2013   Laparascopic , [facility]   TOTAL HIP ARTHROPLASTY Left 01/03/2007   [name] 01/03/2007 (09/30/2012)   TOTAL KNEE ARTHROPLASTY Left 09/12/2014   Procedure: LEFT TOTAL KNEE ARTHROPLASTY;  Surgeon: Tarry Kos, MD;  Location: [facility] OR;  Service:  Orthopedics;  Laterality: Left;   HPI:  Pt is a 72 yo male presenting 8/9 with acute R sided weakness. CTH concerning for possible new age indeterminate infarcts in the L basal ganglia. Pt not a candidate for TNK but was taken to IR for revascularization. MRI 8/10 confirmed evidence of ongoing abnormal flow in the L MCA, L MCA acute infarcts most confluent in the L basal ganglia, and acute infarct in the contralateral R caudate. PMH includes: stroke (2013, residual aphasia and apraxia but no residual weakness), HTN, anxiety, HLD, depression, h/o DVT, hepatitis C, CAD, afib, prostate ca    Assessment / Plan / Recommendation  Clinical Impression  Pt has R facial weakness and incomplete mastication. He also has frequent eructation and multiple subswallows at times, but he has no overt s/s of aspiration, even when allowed to drink rapidly via straw. Will start with Dys 2 diet and thin liquids, but discussed careful assistance and supervision during PO intake with RN given possible signs of dysphagia and cognitive-linguisitic status. SLP Visit Diagnosis: Dysphagia, unspecified (R13.10)    Aspiration Risk  Moderate aspiration risk    Diet Recommendation Dysphagia 2 (Fine chop);Thin liquid    Liquid Administration via: Cup;Straw Medication Administration: Crushed with puree Supervision: Staff to assist with self feeding;Full supervision/cueing for compensatory strategies Compensations: Minimize environmental distractions;Slow rate;Small sips/bites Postural Changes: Seated upright at 90 degrees;Remain upright for at least 30 minutes after po intake    Other  Recommendations Oral Care Recommendations: Oral care BID    Recommendations for follow up therapy are one component of a multi-disciplinary discharge planning process, led by the attending physician.  Recommendations may be updated based on patient status, additional functional criteria and insurance authorization.  Follow up Recommendations  Acute inpatient rehab (3hours/day)      Assistance Recommended at Discharge    Functional Status Assessment Patient has had a recent decline in their functional status and demonstrates the ability to make significant improvements in function in a reasonable and predictable amount of time.  Frequency and Duration min 2x/week  2 weeks       Prognosis Prognosis for improved oropharyngeal function: Good Barriers to Reach Goals: Language deficits      Swallow Study   General HPI: Pt is a 72 yo male presenting 8/9 with acute R sided weakness. CTH concerning for possible new age indeterminate infarcts in the L basal ganglia. Pt not a candidate for TNK but was taken to IR for revascularization. MRI 8/10 confirmed evidence of ongoing abnormal flow in the L MCA, L MCA acute infarcts most confluent in the L basal ganglia, and acute infarct in the contralateral R caudate. PMH includes: stroke (2013, residual aphasia and apraxia but no residual weakness), HTN, anxiety, HLD, depression, h/o DVT, hepatitis C, CAD, afib, prostate ca Type of Study: Bedside Swallow Evaluation Previous Swallow Assessment: none in chart Diet Prior to this Study: NPO Temperature Spikes Noted: No Respiratory Status: Room air History of Recent Intubation:  (for IR 8/9) Behavior/Cognition: Alert;Cooperative;Requires cueing Oral Cavity Assessment: Within Functional Limits Oral Care Completed by SLP: No Oral Cavity - Dentition: Missing dentition Vision: Functional for self-feeding Self-Feeding Abilities: Needs assist Patient Positioning: Upright in bed Baseline Vocal Quality: Normal Volitional Cough: Cognitively unable to elicit Volitional Swallow: Unable to elicit    Oral/Motor/Sensory Function Overall Oral Motor/Sensory Function: Moderate impairment Facial ROM: Reduced right;Suspected CN VII (facial) dysfunction Facial Symmetry: Abnormal symmetry right;Suspected CN VII (facial) dysfunction Facial Strength: Reduced  right;Suspected CN VII (facial) dysfunction   Ice Chips Ice chips: Within functional limits Presentation: Spoon   Thin Liquid Thin Liquid: Impaired Presentation: Cup;Straw Pharyngeal  Phase Impairments: Multiple swallows    Nectar Thick Nectar Thick Liquid: Not tested   Honey Thick Honey Thick Liquid: Not tested   Puree Puree: Within functional limits Presentation: Spoon   Solid     Solid: Impaired Oral Phase Impairments: Impaired mastication      Mahala Menghini., M.A. CCC-SLP Acute Rehabilitation Services Office [phone]  Secure chat preferred  10/23/2022,12:44 PM

## 2022-10-23 NOTE — Evaluation (Signed)
Speech Language Pathology Evaluation Patient Details Name: Frank Barron MRN: [id number] DOB: May 24, 1950 Today's Date: 10/23/2022 Time: 7829-5621 SLP Time Calculation (min) (ACUTE ONLY): 12 min  Problem List:  Patient Active Problem List   Diagnosis Date Noted   Acute ischemic stroke (HCC) 10/22/2022   Middle cerebral artery embolism, left 10/22/2022   Malignant neoplasm of prostate (HCC) 09/08/2016   Severe sepsis (HCC) 01/04/2016   Thrombocytopenia (HCC) 01/04/2016   A-fib (HCC) 01/02/2016   Adrenal mass, right (HCC) 01/02/2016   Solitary pulmonary nodule 01/02/2016   H/O unilateral nephrectomy 01/02/2016   AKI (acute kidney injury) (HCC) 01/02/2016   Pyelonephritis 01/01/2016   S/P total knee replacement using cement 09/12/2014   Urinary retention    Cocaine abuse (HCC)    Acute encephalopathy    Altered mental status 02/02/2014   Acute renal failure (HCC) 02/02/2014   Hypertension 02/02/2014   DVT (deep venous thrombosis) (HCC) 02/02/2014   CAD (coronary artery disease)    Anemia 09/29/2012   Melena 09/29/2012   Aphasia as late effect of cerebrovascular accident 07/03/2012   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   Past Medical History:  Past Medical History:  Diagnosis Date   Anemia    Anxiety    Arthritis    Atrial fibrillation (HCC)    CAD (coronary artery disease)    PCI to LAD 2012   CHF (congestive heart failure) (HCC)    Depression    [name] 07/19/2005 (09/30/2012)   Hepatitis C    pt denies this hx on 09/30/2012   High cholesterol    History of DVT of lower extremity    "left; long time ago" (09/30/2012)   Hypertension    Prostate cancer (HCC)    Renal cell carcinoma (HCC)    Stroke (HCC) 11/18/2011   [name] 07/31/2012; expressive aphasia noted on 09/30/2012   Past Surgical History:  Past Surgical History:  Procedure Laterality Date   COLONOSCOPY N/A 10/01/2012   Procedure: COLONOSCOPY;  Surgeon: Louis Meckel, MD;   Location: [facility] ENDOSCOPY;  Service: Endoscopy;  Laterality: N/A;   CORONARY ANGIOPLASTY WITH STENT PLACEMENT     ESOPHAGOGASTRODUODENOSCOPY N/A 10/01/2012   Procedure: ESOPHAGOGASTRODUODENOSCOPY (EGD);  Surgeon: Louis Meckel, MD;  Location: [facility] ENDOSCOPY;  Service: Endoscopy;  Laterality: N/A;   EYE SURGERY Right 1975   /notes 07/19/2005; "injured playing football" (09/29/2012)   GIVENS CAPSULE STUDY N/A 10/02/2012   Procedure: GIVENS CAPSULE STUDY;  Surgeon: Louis Meckel, MD;  Location: [facility] ENDOSCOPY;  Service: Endoscopy;  Laterality: N/A;   JOINT REPLACEMENT  01/03/2007   LUMBAR DISC SURGERY  2001   herniated disc/notes 07/19/2005 (09/30/2012)   NEPHRECTOMY  12/26/2013   Laparascopic , [facility]   TOTAL HIP ARTHROPLASTY Left 01/03/2007   [name] 01/03/2007 (09/30/2012)   TOTAL KNEE ARTHROPLASTY Left 09/12/2014   Procedure: LEFT TOTAL KNEE ARTHROPLASTY;  Surgeon: Tarry Kos, MD;  Location: [facility] OR;  Service: Orthopedics;  Laterality: Left;   HPI:  Pt is a 72 yo male presenting 8/9 with acute R sided weakness. CTH concerning for possible new age indeterminate infarcts in the L basal ganglia. Pt not a candidate for TNK but was taken to IR for revascularization. MRI 8/10 confirmed evidence of ongoing abnormal flow in the L MCA, L MCA acute infarcts most confluent in the L basal ganglia, and acute infarct in the contralateral R caudate. PMH includes: stroke (2013, residual aphasia and apraxia but no residual weakness), HTN, anxiety, HLD, depression,  h/o DVT, hepatitis C, CAD, afib, prostate ca   Assessment / Plan / Recommendation Clinical Impression  Pt has significant communication deficits that include difficulties with expressive and receptive language. He followed commands ~50% of the time, more so during functional tasks. Attempted to use the communication board in his room but without accuracy in identifying pictures or letters (question impact of vision and likely too large of a field of  options). He answered very simple yes/no questions accurately, but with quick decline in accuracy and responses as questions became mildly more complex. His expressive output is limited and intelligibility is reduced. Pt will benefit from ongoing SLP f/ut o maximize communication with ongoing assessment of cognition needed.    SLP Assessment  SLP Recommendation/Assessment: Patient needs continued Speech Lanaguage Pathology Services SLP Visit Diagnosis: Aphasia (R47.01)    Recommendations for follow up therapy are one component of a multi-disciplinary discharge planning process, led by the attending physician.  Recommendations may be updated based on patient status, additional functional criteria and insurance authorization.    Follow Up Recommendations  Acute inpatient rehab (3hours/day)    Assistance Recommended at Discharge  Frequent or constant Supervision/Assistance  Functional Status Assessment Patient has had a recent decline in their functional status and demonstrates the ability to make significant improvements in function in a reasonable and predictable amount of time.  Frequency and Duration min 2x/week  2 weeks      SLP Evaluation Cognition  Overall Cognitive Status: Impaired/Different from baseline Arousal/Alertness: Awake/alert Orientation Level: Oriented to person Awareness: Impaired Awareness Impairment: Emergent impairment Problem Solving: Impaired Problem Solving Impairment: Verbal basic       Comprehension  Auditory Comprehension Overall Auditory Comprehension: Impaired Yes/No Questions: Impaired Basic Biographical Questions: 76-100% accurate Basic Immediate Environment Questions: 75-100% accurate Complex Questions: 25-49% accurate Commands: Impaired One Step Basic Commands: 50-74% accurate Interfering Components: Visual impairments Visual Recognition/Discrimination Discrimination: Exceptions to WFL Photographs: Unable to indentify Reading  Comprehension Reading Status: Impaired Word level: Impaired    Expression Expression Primary Mode of Expression: Verbal Verbal Expression Overall Verbal Expression: Impaired Initiation: No impairment Automatic Speech: Name;Social Response Level of Generative/Spontaneous Verbalization: Word Repetition: Impaired Level of Impairment: Word level Naming: Impairment Confrontation: Impaired Photographs: Unable to indentify Non-Verbal Means of Communication: Gestures;Communication board (see clinical impressions)   Oral / Motor  Oral Motor/Sensory Function Overall Oral Motor/Sensory Function: Moderate impairment Facial ROM: Reduced right;Suspected CN VII (facial) dysfunction Facial Symmetry: Abnormal symmetry right;Suspected CN VII (facial) dysfunction Facial Strength: Reduced right;Suspected CN VII (facial) dysfunction Motor Speech Overall Motor Speech: Impaired Respiration: Within functional limits Phonation: Normal Resonance: Within functional limits Articulation: Impaired Level of Impairment: Word            Mahala Menghini., M.A. CCC-SLP Acute Rehabilitation Services Office [phone]  Secure chat preferred  10/23/2022, 1:18 PM

## 2022-10-23 NOTE — Progress Notes (Signed)
Echocardiogram 2D Echocardiogram has been performed.  Irish Lack, RDCS 10/23/2022, 11:01 AM

## 2022-10-23 NOTE — Progress Notes (Signed)
Referring Physician(s): Dr Otelia Limes  Supervising Physician: Julieanne Cotton  Patient Status:  [facility] - In-pt  Chief Complaint:  CVA Occl L MCA  Subjective:  Status post revascularization of the distal left MCA M1 segment, and partially the inferior division with 1 pass of, contact aspiration with 055 zoom aspiration catheter, 1 pass with 44 zoom contact, aspiration, and 1 pass with 038 Socrates aspiration catheter achieving TICI 2b revascularization.    Some better this am Moving Rt leg better None on Rt hand/arm Trying to communicate-- seems to understand [place] and questions asked of him Unable to voice well his responses  Allergies: Patient has no known allergies.  Medications: Prior to Admission medications   Medication Sig Start Date End Date Taking? Authorizing Provider  acetaminophen (TYLENOL) 650 MG CR tablet Take 1,300 mg by mouth daily as needed for pain.    [provider]  amLODipine (NORVASC) 5 MG tablet Take 5 mg by mouth daily.    [provider]  aspirin 81 MG chewable tablet Chew 81 mg by mouth daily.    [provider]  Cholecalciferol (VITAMIN D3) 2000 units TABS Take 2,000 Units by mouth daily.    [provider]  diclofenac Sodium (VOLTAREN ARTHRITIS PAIN) 1 % GEL Apply 2 g topically as needed.    [provider]  hydrALAZINE (APRESOLINE) 25 MG tablet Take 1 tablet (25 mg total) by mouth 2 (two) times daily. Patient taking differently: Take 25 mg by mouth daily. 02/07/14   Elgergawy, Leana Roe, MD  lidocaine (LIDODERM) 5 % Place 1 patch onto the skin daily.    [provider]  loratadine (CLARITIN) 10 MG tablet Take 10 mg by mouth daily.    [provider]  Multiple Vitamin (MULTIVITAMIN ADULT PO) Take 1 tablet by mouth daily.    [provider]  naltrexone (DEPADE) 50 MG tablet Take 50 mg by mouth daily.    [provider]  Rivaroxaban (XARELTO) 15 MG TABS tablet Take 15 mg by  mouth daily.    [provider]  rosuvastatin (CRESTOR) 20 MG tablet Take 1.5 tablets by mouth daily.    [provider]  sildenafil (VIAGRA) 100 MG tablet Take 1.5 tablets by mouth as needed for erectile dysfunction.    [provider]  tamsulosin (FLOMAX) 0.4 MG CAPS capsule Take 0.4 mg by mouth daily. For fluid     [provider]  thiamine (VITAMIN B-1) 100 MG tablet Take 100 mg by mouth daily.    [provider]  traZODone (DESYREL) 50 MG tablet Take 50 mg by mouth at bedtime as needed for sleep.    [provider]     Vital Signs: BP (!) 126/93   Pulse 63   Temp (!) 97.4 F (36.3 C) (Axillary)   Resp 15   Ht 6\' 3"  (1.905 m)   Wt 223 lb 15.8 oz (101.6 kg)   SpO2 96%   BMI 28.00 kg/m   Physical Exam Vitals reviewed.  Musculoskeletal:     Comments: Face asymmetrical Does attempt to stick out tongue and follow command Does understand commands  Moves left well Rt leg can move to command--can bend leg and move toes No movement on Rt arm/hand  Skin:    General: Skin is warm.     Comments: Rt groin NT no bleeding Soft; no hematoma Good pulses Rt foot  Neurological:     Mental Status: He is alert.     Imaging:  MR BRAIN WO CONTRAST  Result Date: 10/23/2022 CLINICAL DATA:  72 year old male code stroke presentation yesterday. Acute left M1 occlusion. Status post endovascular revascularization. Mild subarachnoid hemorrhage suspected postprocedural. EXAM: MRI HEAD WITHOUT CONTRAST TECHNIQUE: Multiplanar, multiecho pulse sequences of the brain and surrounding structures were obtained without intravenous contrast. COMPARISON:  CTA, CT head, and CTP yesterday. No previous brain MRI. FINDINGS: Brain: Confluent restricted diffusion throughout the left basal ganglia (series 5, image 77) and patchy additional scattered cortical and white matter diffusion restriction, mostly in the middle sylvian division. Underlying chronic left MCA  territory infarct with encephalomalacia mostly in the posterior division. Cytotoxic edema in the affected areas.Blood products on SWI which seem more related with the chronic encephalomalacia than acutely within subarachnoid spaces. And no IVH is identified. No midline shift. Only mild mass effect on the left lateral ventricle from the left basal ganglia. No posterior fossa diffusion restriction, but there is punctate restricted diffusion in the contralateral right caudate (series 7, image 56). Small chronic right cerebellar infarct. Cervicomedullary junction and pituitary are within normal limits. Vascular: Major intracranial vascular flow voids are preserved proximally, although the left MCA appears highly asymmetric on both T2 and FLAIR imaging suggesting abnormal flow persists (series 11, image 22). Skull and upper cervical spine: Visualized bone marrow signal is within normal limits. Negative for age visible cervical spine. Sinuses/Orbits: Postoperative changes to the right globe. Paranasal Visualized paranasal sinuses and mastoids are stable and well aerated. Other: Visible internal auditory structures appear normal. IMPRESSION: 1. Evidence of ongoing abnormal flow in the Left MCA on this routine noncontrast exam. 2. Left MCA territory acute infarcts most confluent in the left basal ganglia. No significant subarachnoid blood by MRI, head CT would be more sensitive. But chronic left MCA territory encephalomalacia with abundant hemosiderin. No significant intracranial mass effect. 3. Superimposed punctate acute infarct in the contralateral right caudate. Electronically Signed   By: Odessa Fleming M.D.   On: 10/23/2022 04:59   CT C-SPINE NO CHARGE  Result Date: 10/22/2022 CLINICAL DATA:  Code stroke EXAM: CT CERVICAL SPINE WITHOUT CONTRAST TECHNIQUE: Multidetector CT imaging of the cervical spine was performed without intravenous contrast. Multiplanar CT image reconstructions were also generated. RADIATION DOSE  REDUCTION: This exam was performed according to the departmental dose-optimization program which includes automated exposure control, adjustment of the mA and/or kV according to patient size and/or use of iterative reconstruction technique. COMPARISON:  Cervical spine CT 02/09/2021 FINDINGS: Alignment: Normal. There is no jumped or perched facet or other evidence of traumatic malalignment. Skull base and vertebrae: Skull base alignment is maintained. Vertebral body heights are preserved. There is no evidence of acute fracture. There are bulky anterior osteophytes throughout the cervical spine. Soft tissues and spinal canal: No prevertebral fluid or swelling. No visible canal hematoma. Disc levels: Bulky anterior osteophytes throughout the cervical spine are again seen. 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 Upper chest: The imaged lung apices are clear. Other: None. IMPRESSION: No acute fracture or traumatic malalignment of the cervical spine Electronically Signed   By: Lesia Hausen M.D.   On: 10/22/2022 11:12   CT ANGIO HEAD NECK W WO CM W PERF (CODE STROKE)  Result Date: 10/22/2022 CLINICAL DATA:  Code stroke EXAM: CT ANGIOGRAPHY HEAD AND NECK CT PERFUSION BRAIN TECHNIQUE: Multidetector CT imaging of the head and neck was performed using the standard protocol during bolus administration of intravenous contrast. Multiplanar CT  image reconstructions and MIPs were obtained to evaluate the vascular anatomy. Carotid stenosis measurements (when applicable) are obtained utilizing NASCET criteria, using the distal internal carotid diameter as the denominator. Multiphase CT imaging of the brain was performed following IV bolus contrast injection. Subsequent parametric perfusion maps were calculated using RAPID software. RADIATION DOSE REDUCTION: This exam was performed according to the departmental dose-optimization program which  includes automated exposure control, adjustment of the mA and/or kV according to patient size and/or use of iterative reconstruction technique. CONTRAST:  OMNIPAQUE IOHEXOL 350 MG/ML SOLN COMPARISON:  None Available. Same-day noncontrast CT head FINDINGS: CTA NECK FINDINGS Aortic arch: The imaged aortic arch is normal. The origins of the major branch vessels are patent. The subclavian arteries are patent to the level imaged. Right carotid system: The right common, internal, and external carotid arteries are patent, without hemodynamically significant stenosis or occlusion there is no evidence of dissection or aneurysm. Left carotid system: The left common, internal, and external carotid arteries are patent, with minimal plaque at the bifurcation but no hemodynamically significant stenosis or occlusion. There is no evidence of dissection or aneurysm. Vertebral arteries: The vertebral arteries are patent, without hemodynamically significant stenosis or occlusion there is no evidence of dissection or aneurysm. Skeleton: The cervical spine is assessed in full on the separately dictated cervical spine CT. Other neck: The soft tissues of the neck are unremarkable. Upper chest: The imaged lung apices are clear. Review of the MIP images confirms the above findings CTA HEAD FINDINGS Anterior circulation: The intracranial ICAs are patent, with mild stenosis of the communicating segment on the right. There is acute occlusion of the distal left M1 segment with some reconstitution of flow in distal branches. The right M1 segment and distal branches are patent The bilateral ACAS are patent, without proximal stenosis or occlusion. The right A1 segment is hypoplastic. The anterior communicating artery is normal. There is no aneurysm or AVM. Posterior circulation: The bilateral V4 segments are patent. The basilar artery is patent. The major cerebellar arteries appear patent. The bilateral PCAs are patent, without proximal  stenosis or occlusion. There is a diminutive left posterior communicating artery with a probable infundibular origin. No definite right posterior communicating artery is identified. There is no aneurysm or AVM. Venous sinuses: Patent. Anatomic variants: As above. Review of the MIP images confirms the above findings CT Brain Perfusion Findings: ASPECTS: 9 CBF (<30%) Volume: 0mL Perfusion (Tmax>6.0s) volume: 65mL Mismatch Volume: 65mL Infarction Location:The penumbra is identified in the left MCA distribution. IMPRESSION: 1. Acute left M1 occlusion with some reconstitution of flow in distal branches. 2. 65 cc ischemic penumbra in the MCA distribution. No infarct core identified. 3. No other hemodynamically significant stenosis or occlusion in the vasculature of the head or neck. These results were called by telephone at the time of interpretation on 10/22/2022 at 10:56 am to provider Dr Trilby Leaver, who verbally acknowledged these results. Electronically Signed   By: Lesia Hausen M.D.   On: 10/22/2022 11:08   CT HEAD CODE STROKE WO CONTRAST  Result Date: 10/22/2022 CLINICAL DATA:  Code stroke.  No localizing sign provided EXAM: CT HEAD WITHOUT CONTRAST TECHNIQUE: Contiguous axial images were obtained from the base of the skull through the vertex without intravenous contrast. RADIATION DOSE REDUCTION: This exam was performed according to the departmental dose-optimization program which includes automated exposure control, adjustment of the mA and/or kV according to patient size and/or use of iterative reconstruction technique. COMPARISON:  CT Head 02/09/21 FINDINGS: Brain:  Redemonstrated is a chronic left MCA territory infarct involving the insula and anterior temporal lobe. Compared to prior exam there is new hypodensity in the left basal ganglia (series 2, image 20). No evidence of hemorrhage, hydrocephalus, extra-axial collection or mass lesion/mass effect. Vascular: No hyperdense vessel or unexpected calcification.  Skull: Normal. Negative for fracture or focal lesion. Sinuses/Orbits: No middle ear effusion. Trace left mastoid effusion. Paranasal sinuses are clear. Right lens replacement and a right scleral band. Orbits are otherwise unremarkable. Other: None. ASPECTS [facility] Stroke Program Early CT Score): 9 when accounting for chronic findings. IMPRESSION: 1. Possible new age indeterminate infarcts in the left basal ganglia. Aspects is 9 when accounting for chronic findings. 2. Chronic left MCA territory infarct. Findings were paged to Dr. Viviann Spare on 10/22/2022 at 10:31 a.m. Electronically Signed   By: Lorenza Cambridge M.D.   On: 10/22/2022 10:31    Labs:  CBC: Recent Labs    10/14/22 0848 10/22/22 1016 10/22/22 1026 10/23/22 0618  WBC 3.7* 4.8  --  5.9  HGB 14.2 15.0 15.3 13.1  HCT 43.2 45.0 45.0 37.9*  PLT 195 121*  --  127*    COAGS: Recent Labs    10/22/22 1016  INR 1.2  APTT 27    BMP: Recent Labs    10/14/22 0848 10/22/22 1026 10/23/22 0618  NA 141 138 135  K 4.7 4.3 4.7  CL 109 108 102  CO2 23  --  20*  GLUCOSE 78 102* 105*  BUN 25* 20 15  CALCIUM 9.6  --  8.6*  CREATININE 1.32* 1.40* 1.36*  GFRNONAA 58*  --  56*    LIVER FUNCTION TESTS: No results for input(s): "BILITOT", "AST", "ALT", "ALKPHOS", "PROT", "ALBUMIN" in the last 8760 hours.  Assessment and Plan:  L MCA revascularization in NIR 8/9 Moving some better on Left No movement on left arm Words are better and cognition improved Plan per Stroke Team   Electronically Signed: Robet Leu, PA-C 10/23/2022, 9:13 AM   I spent a total of 15 Minutes at the the patient's bedside AND on the patient's hospital floor or unit, greater than 50% of which was counseling/coordinating care for L MCA revascularization

## 2022-10-23 NOTE — Evaluation (Signed)
Physical Therapy Evaluation Patient Details Name: Frank Barron MRN: [id number] DOB: [birth year] Today's Date: 10/23/2022  History of Present Illness  Pt is a 72 yo male presenting 8/9 with acute R sided weakness. CTH concerning for possible new age indeterminate infarcts in the L basal ganglia. Pt not a candidate for TNK but was taken to IR for revascularization. MRI 8/10 confirmed evidence of ongoing abnormal flow in the L MCA, L MCA acute infarcts most confluent in the L basal ganglia, and acute infarct in the contralateral R caudate. 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 Pt with expressive aphasia and inaccurate yes/no responses; able to follow one step commands majority of the time. Pt requiring two person minimal assist for bed mobility and maximal assist for transfers to standing from edge of bed. Demonstrates R hemiplegia, increased tone, decreased ROM, impaired balance, communication. Patient will benefit from intensive inpatient follow up therapy, >3 hours/day in order to address deficits and maximize functional mobility.       If plan is discharge home, recommend the following: Two people to help with walking and/or transfers;Two people to help with bathing/dressing/bathroom   Can travel by private vehicle        Equipment Recommendations Other (comment) (TBA)  Recommendations for Other Services  Rehab consult    Functional Status Assessment Patient has had a recent decline in their functional status and demonstrates the ability to make significant improvements in function in a reasonable and predictable amount of time.     Precautions / Restrictions Precautions Precautions: Fall;Other (comment) Precaution Comments: R hemiplegia, aphasia Restrictions Weight Bearing Restrictions: No      Mobility  Bed  Mobility Overal bed mobility: Needs Assistance Bed Mobility: Supine to Sit, Sit to Supine     Supine to sit: +2 for physical assistance, Min assist Sit to supine: Min assist, +2 for physical assistance   General bed mobility comments: Assist for RLE management    Transfers Overall transfer level: Needs assistance Equipment used: None Transfers: Sit to/from Stand Sit to Stand: Max assist, +2 physical assistance           General transfer comment: MaxA + 2 to partially stand from edge of bed and then perform lateral scoot towards left. Right knee block provided    Ambulation/Gait                  Stairs            Wheelchair Mobility     Tilt Bed    Modified Rankin (Stroke Patients Only) Modified Rankin (Stroke Patients Only) Pre-Morbid Rankin Score: No significant disability Modified Rankin: Severe disability     Balance Overall balance assessment: Needs assistance Sitting-balance support: Feet supported Sitting balance-Leahy Scale: Fair Sitting balance - Comments: Supervision -min guard; increased left lateral lean following transfer   Standing balance support: Single extremity supported, During functional activity Standing balance-Leahy Scale: Zero                               Pertinent Vitals/Pain Pain Assessment Pain Assessment: Faces Faces Pain Scale: No hurt    Home Living Family/patient expects to be discharged to:: Private residence Living Arrangements: Alone                 Additional Comments: Pt  unable to provide    Prior Function Prior Level of Function : Patient poor historian/Family not available                     Extremity/Trunk Assessment   Upper Extremity Assessment Upper Extremity Assessment: Defer to OT evaluation    Lower Extremity Assessment Lower Extremity Assessment: RLE deficits/detail RLE Deficits / Details: Grossly 1/5, able to perform limited heel slide and wiggle toes.  Dorsiflexion ROM limited. Increased tone       Communication   Communication Communication: Difficulty following commands/understanding;Difficulty communicating thoughts/reduced clarity of speech Following commands: Follows one step commands inconsistently Cueing Techniques: Verbal cues  Cognition Arousal: Alert Behavior During Therapy: Flat affect Overall Cognitive Status: Impaired/Different from baseline Area of Impairment: Following commands                       Following Commands: Follows one step commands inconsistently       General Comments: Pt with expressive aphasia, inaccurate yes/no responses, able to use communication board with basic questions. Follows ~75% of 1 step commands        General Comments      Exercises     Assessment/Plan    PT Assessment Patient needs continued PT services  PT Problem List Decreased strength;Decreased activity tolerance;Decreased balance;Decreased mobility;Decreased cognition;Decreased safety awareness       PT Treatment Interventions DME instruction;Gait training;Functional mobility training;Therapeutic activities;Therapeutic exercise;Balance training;Patient/family education    PT Goals (Current goals can be found in the Care Plan section)  Acute Rehab PT Goals Patient Stated Goal: unable PT Goal Formulation: Patient unable to participate in goal setting Time For Goal Achievement: 11/06/22 Potential to Achieve Goals: Good    Frequency Min 1X/week     Co-evaluation               AM-PAC PT "6 Clicks" Mobility  Outcome Measure Help needed turning from your back to your side while in a flat bed without using bedrails?: A Little Help needed moving from lying on your back to sitting on the side of a flat bed without using bedrails?: A Little Help needed moving to and from a bed to a chair (including a wheelchair)?: Total Help needed standing up from a chair using your arms (e.g., wheelchair or bedside  chair)?: Total Help needed to walk in hospital room?: Total Help needed climbing 3-5 steps with a railing? : Total 6 Click Score: 10    End of Session Equipment Utilized During Treatment: Gait belt Activity Tolerance: Patient tolerated treatment well Patient left: in bed;with call bell/phone within reach;with bed alarm set Nurse Communication: Mobility status PT Visit Diagnosis: Unsteadiness on feet (R26.81);Hemiplegia and hemiparesis Hemiplegia - Right/Left: Right Hemiplegia - dominant/non-dominant: Dominant Hemiplegia - caused by: Cerebral infarction    Time: 4098-1191 PT Time Calculation (min) (ACUTE ONLY): 22 min   Charges:   PT Evaluation $PT Eval Moderate Complexity: 1 Mod   PT General Charges $$ ACUTE PT VISIT: 1 Visit         Lillia Pauls, PT, DPT Acute Rehabilitation Services Office [phone]   Norval Morton 10/23/2022, 1:12 PM

## 2022-10-23 NOTE — Progress Notes (Signed)
Inpatient Rehab Admissions Coordinator Note:   Per PT and ST patient was screened for CIR candidacy by  Luvenia Starch, CCC-SLP. At this time, pt appears to be a potential candidate for CIR. I will place an order for rehab consult for full assessment, per our protocol.  Please contact me any with questions.Wolfgang Phoenix, MS, CCC-SLP Admissions Coordinator [phone] 10/23/22 4:04 PM

## 2022-10-24 DIAGNOSIS — I639 Cerebral infarction, unspecified: Secondary | ICD-10-CM | POA: Diagnosis not present

## 2022-10-24 MED ORDER — HEPARIN SODIUM (PORCINE) 5000 UNIT/ML IJ SOLN: 5000.0000 [IU] | Freq: Three times a day (TID) | INTRAMUSCULAR | Status: DC

## 2022-10-24 MED ORDER — ESCITALOPRAM OXALATE 10 MG PO TABS
10.0000 mg | ORAL_TABLET | Freq: Every day | ORAL | Status: DC
  Administered 2022-10-24 – 2022-10-25 (×2): 10 mg via ORAL
  Filled 2022-10-24 (×3): qty 1

## 2022-10-24 NOTE — Progress Notes (Addendum)
STROKE TEAM PROGRESS NOTE   BRIEF HPI [name] is a 72 y.o. 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  ambulates with a walker at baseline.  S/p mechanical thrombectomy of left M1 with TICI 2c  SIGNIFICANT HOSPITAL EVENTS 8/9 mechanical thrombectomy of left M1 with TICI 2c  INTERIM HISTORY/SUBJECTIVE Patient is awake and alert in no apparent distress.  His sister at the bedside. Patients daughter [name] was updated by Dr. Pearlean Brownie via phone. Cleviprex gtt has been off since yesterday.  Will dc IV fluids. Will start Lexapro 10 mg to help with mood.  Neurological exam is stable and unchanged.  Remains with significant expressive aphasia and can barely speak a few words moderate right hemiparesis Will transfer patient out of the ICU today   OBJECTIVE  CBC    Component Value Date/Time   WBC 5.9 10/23/2022 0618   RBC 4.32 10/23/2022 0618   HGB 13.1 10/23/2022 0618   HCT 37.9 (L) 10/23/2022 0618   PLT 127 (L) 10/23/2022 0618   MCV 87.7 10/23/2022 0618   MCH 30.3 10/23/2022 0618   MCHC 34.6 10/23/2022 0618   RDW 14.6 10/23/2022 0618   LYMPHSABS 0.8 10/23/2022 0618   MONOABS 0.6 10/23/2022 0618   EOSABS 0.0 10/23/2022 0618   BASOSABS 0.0 10/23/2022 0618    BMET    Component Value Date/Time   NA 135 10/23/2022 0618   K 4.7 10/23/2022 0618   CL 102 10/23/2022 0618   CO2 20 (L) 10/23/2022 0618   GLUCOSE 105 (H) 10/23/2022 0618   BUN 15 10/23/2022 0618   CREATININE 1.36 (H) 10/23/2022 0618   CALCIUM 8.6 (L) 10/23/2022 0618   GFRNONAA 56 (L) 10/23/2022 0618     IMAGING past 24 hours No results found.  Vitals:   10/24/22 0600 10/24/22 0800 10/24/22 0900 10/24/22 1000  BP: (!) 157/99 (!) 133/99 (!) 131/103 (!) 126/93  Pulse: 70 75 68 75  Resp: 12 19 20  (!) 23  Temp:  (!) 97.5 F (36.4 C)    TempSrc:  Oral    SpO2: 97% 99% 99% 98%  Weight:      Height:         PHYSICAL EXAM General:  Alert, well-nourished, well-developed middle-aged African-American male in no acute distress Psych:  Mood and affect appropriate for situation CV: Regular rate and rhythm on monitor Respiratory:  Regular, unlabored respirations on room air GI: Abdomen soft and nontender   NEURO:  Mental Status: AA&Ox3, severe expressive aphasia can follow simple midline commands only.  Impaired naming repetition Speech/Language: speech is with dysarthria    Cranial Nerves:  II: PERRL.  Decreased blink to threat on the right III, IV, VI: EOMI. Eyelids elevate symmetrically.  V: Sensation is intact to light touch and symmetrical to face.  VII: Right facial droop VIII: hearing intact to voice. IX, X: Palate elevates symmetrically. Phonation is normal.  KY:HCWCBJSE shrug 5/5. XII: tongue is midline without fasciculations. Motor: Right hemiparesis right arm and right leg 2/5 with increased tone and decreased right grip Tone: is normal and bulk is normal Sensation- Intact to light touch bilaterally. Extinction absent  to light touch to DSS.   Coordination: FTN intact bilaterally, HKS: no ataxia in BLE.No drift.  Gait- deferred   ASSESSMENT/PLAN  Acute Ischemic Infarct:  left MCA s/p mechanical thrombectomy of left M1 with TICI 2c Etiology: Cardioembolic in the setting of A-fib on Xarelto-unsure about compliance Code Stroke  CT head Possible new age indeterminate infarcts in the left basal ganglia. ASPECTS 9   CTA head & neck Acute left M1 occlusion with some reconstitution of flow in distal branches. CT perfusion  65 cc ischemic penumbra in the MCA distribution.  No infarct core identified. Post IR CT mild subarachnoid hemorrhage in the distal left anterior parietal cortical region. MRI  Left MCA territory acute infarcts most confluent in the left basal ganglia. 2D Echo Ef 65-70%.  LDL 92 HgbA1c 5.2 VTE prophylaxis -SCDs Xarelto (rivaroxaban) daily prior to admission, now on Xarelto (rivaroxaban) daily  Therapy recommendations: Pending Disposition: Pending  Hx of Stroke/TIA With residual aphasia  stroke in 2013  Atrial fibrillation Home Meds: Xarelto- unsure about compliance  Continue telemetry monitoring Restart Xarelto today  Hypertension Hypertensive emergency Home meds: Amlodipine 5 mg, hydralazine 25 mg twice daily Cleviprex gtt off Added home amlodipine 10 mg Blood Pressure Goal: SBP 120-160 for first 24 hours then less than 180   Hyperlipidemia Home meds: Crestor 30 mg, increased to 40 LDL 92, goal < 70 Increase to 40 mg Continue statin at discharge  Diabetes type II Controlled Home meds: None HgbA1c 5.2, goal < 7.0 CBGs   Substance Abuse Patient uses cocaine UDS positive for Cocaine      Ready to quit? N/A TOC consult for cessation placed  Dysphagia Patient has post-stroke dysphagia, SLP consulted    Diet   DIET DYS 2 Room service appropriate? No; Fluid consistency: Thin   Advance diet as tolerated  History of DVT Noted  Other Stroke Risk Factors  ETOH use, alcohol level <10, advised to drink no more than 2 drink(s) a day Coronary artery disease Congestive heart failure   Other Active Problems History of renal and prostate cancer-resume home [facility]  Hospital day # 2  I have personally obtained history,examined this patient, reviewed notes, independently viewed imaging studies, participated in medical decision making and plan of care.ROS completed by me personally and pertinent positives fully documented  I have made any additions or clarifications directly to the above note. Agree with note above.   Patient remains with significant expressive aphasia and right hemiplegia.  Continue Xarelto for stroke prevention for A-fib.  Mobilize out of bed.  Visit occupational and speech therapy consults.  Transfer to neurology floor bed.  Long discussion with patient and sister at the bedside and answered questions.  Discussed with his daughter over the phone as well.  Anticipate patient going to inpatient rehab next week.  Greater than 50% time during this 50-minute visit was spent on counseling and coordination of care about his embolic stroke and atrial fibrillation discussion about evaluation and treatment and answering questions.  Delia Heady, MD Medical Director [facility] Pager: [phone] 10/24/2022 1:39 PM    To contact Stroke Continuity provider, please refer to [email or web address]. After hours, contact General Neurology

## 2022-10-24 NOTE — Progress Notes (Signed)
Referring Physician(s): Dr Pearlean Brownie  Supervising Physician: Julieanne Cotton  Patient Status:  Frank Barron - In-pt  Chief Complaint:  CVA Occl L MCA  Subjective:  Status post revascularization of the distal left MCA M1 segment, and partially the inferior division with 1 pass of, contact aspiration with 055 zoom aspiration catheter, 1 pass with 44 zoom contact, aspiration, and 1 pass with 038 Socrates aspiration catheter achieving TICI 2b revascularization.    Even better today Up in bed with glasses on Tries to communicate; understands conversation Some words are appropriate; "ok"; "no"; "not yet" Tries to smile-- +left droop   Allergies: Patient has no known allergies.  Medications: Prior to Admission medications   Medication Sig Start Date End Date Taking? Authorizing Provider  acetaminophen (TYLENOL) 650 MG CR tablet Take 1,300 mg by mouth daily as needed for pain.    [provider]  amLODipine (NORVASC) 5 MG tablet Take 5 mg by mouth daily.    [provider]  aspirin 81 MG chewable tablet Chew 81 mg by mouth daily.    [provider]  Cholecalciferol (VITAMIN D3) 2000 units TABS Take 2,000 Units by mouth daily.    [provider]  diclofenac Sodium (VOLTAREN ARTHRITIS PAIN) 1 % GEL Apply 2 g topically as needed.    [provider]  hydrALAZINE (APRESOLINE) 25 MG tablet Take 1 tablet (25 mg total) by mouth 2 (two) times daily. Patient taking differently: Take 25 mg by mouth daily. 02/07/14   Elgergawy, Leana Roe, MD  lidocaine (LIDODERM) 5 % Place 1 patch onto the skin daily.    [provider]  loratadine (CLARITIN) 10 MG tablet Take 10 mg by mouth daily.    [provider]  Multiple Vitamin (MULTIVITAMIN ADULT PO) Take 1 tablet by mouth daily.    [provider]  naltrexone (DEPADE) 50 MG tablet Take 50 mg by mouth daily.    [provider]  Rivaroxaban (XARELTO) 15 MG TABS tablet Take 15 mg by  mouth daily.    [provider]  rosuvastatin (CRESTOR) 20 MG tablet Take 1.5 tablets by mouth daily.    [provider]  sildenafil (VIAGRA) 100 MG tablet Take 1.5 tablets by mouth as needed for erectile dysfunction.    [provider]  tamsulosin (FLOMAX) 0.4 MG CAPS capsule Take 0.4 mg by mouth daily. For fluid     [provider]  thiamine (VITAMIN B-1) 100 MG tablet Take 100 mg by mouth daily.    [provider]  traZODone (DESYREL) 50 MG tablet Take 50 mg by mouth at bedtime as needed for sleep.    [provider]     Vital Signs: BP (!) 157/99   Pulse 70   Temp 97.9 F (36.6 C) (Oral)   Resp 12   Ht 6\' 3"  (1.905 m)   Wt 223 lb 15.8 oz (101.6 kg)   SpO2 97%   BMI 28.00 kg/m   Physical Exam Vitals reviewed.  Constitutional:      Comments: Left face droop Follows command to stick out tongue--- still to left  Musculoskeletal:     Comments: Moves Left side well and to command Good grip and good strength Rt leg moving better- follows command; better strength Rt arm- no change; no movement  Skin:    General: Skin is warm.     Comments: Rt groin NT no bleeding; no hematoma  Neurological:     Mental Status: He is alert.  Imaging: ECHOCARDIOGRAM COMPLETE BUBBLE STUDY  Result Date: 10/23/2022    ECHOCARDIOGRAM REPORT   Patient Name:   Frank Barron Date of Exam: 10/23/2022 Medical Rec #:  [id number]       Height:       75.0 in Accession #:    [id number]      Weight:       224.0 lb Date of Birth:  10/06/1950        BSA:          2.303 m Patient Age:    71 years        BP:           120/95 mmHg Patient Gender: M               HR:           68 bpm. Exam Location:  Inpatient Procedure: 2D Echo, Color Doppler, Cardiac Doppler, Saline Contrast Bubble Study            and Intracardiac Opacification Agent Indications:    Stroke  History:        Patient has no prior history of Echocardiogram examinations.                 CHF, CAD,  Stroke, Arrythmias:Atrial Fibrillation; Risk                 Factors:Dyslipidemia. History of DVT.  Sonographer:    Raeford Razor Referring Phys: [id number] Reyne Dumas [facility]  Sonographer Comments: Suboptimal subcostal window. IMPRESSIONS  1. Left ventricular ejection fraction, by estimation, is 65 to 70%. The left ventricle has normal function. The left ventricle has no regional wall motion abnormalities. Left ventricular diastolic parameters are indeterminate.  2. Right ventricular systolic function is normal. The right ventricular size is normal. Tricuspid regurgitation signal is inadequate for assessing PA pressure.  3. The mitral valve is abnormal. No evidence of mitral valve regurgitation. No evidence of mitral stenosis.  4. The aortic valve is tricuspid. Aortic valve regurgitation is not visualized. No aortic stenosis is present.  5. Agitated saline contrast bubble study was negative, with no evidence of any interatrial shunt. Comparison(s): No prior Echocardiogram. FINDINGS  Left Ventricle: Left ventricular ejection fraction, by estimation, is 65 to 70%. The left ventricle has normal function. The left ventricle has no regional wall motion abnormalities. Definity contrast agent was given IV to delineate the left ventricular  endocardial borders. The left ventricular internal cavity size was normal in size. There is no left ventricular hypertrophy. Left ventricular diastolic parameters are indeterminate. Right Ventricle: The right ventricular size is normal. No increase in right ventricular wall thickness. Right ventricular systolic function is normal. Tricuspid regurgitation signal is inadequate for assessing PA pressure. Left Atrium: Left atrial size was normal in size. Right Atrium: Right atrial size was normal in size. Pericardium: There is no evidence of pericardial effusion. Mitral Valve: Mitral valve prolapse. The mitral valve is abnormal. No evidence of mitral valve regurgitation. No evidence of mitral valve  stenosis. Tricuspid Valve: The tricuspid valve is normal in structure. Tricuspid valve regurgitation is not demonstrated. No evidence of tricuspid stenosis. Aortic Valve: The aortic valve is tricuspid. Aortic valve regurgitation is not visualized. No aortic stenosis is present. Aortic valve peak gradient measures 4.4 mmHg. Pulmonic Valve: The pulmonic valve was normal in structure. Pulmonic valve regurgitation is not visualized. No evidence of pulmonic stenosis. Aorta: The aortic root, ascending aorta and aortic arch are all structurally normal, with no evidence of  dilitation or obstruction. IAS/Shunts: No atrial level shunt detected by color flow Doppler. Agitated saline contrast was given intravenously to evaluate for intracardiac shunting. Agitated saline contrast bubble study was negative, with no evidence of any interatrial shunt.  LEFT VENTRICLE PLAX 2D LVIDd:         4.40 cm      Diastology LVIDs:         2.50 cm      LV e' medial:    8.27 cm/s LV PW:         0.90 cm      LV E/e' medial:  9.1 LV IVS:        0.90 cm      LV e' lateral:   12.30 cm/s LVOT diam:     2.10 cm      LV E/e' lateral: 6.1 LV SV:         51 LV SV Index:   22 LVOT Area:     3.46 cm  LV Volumes (MOD) LV vol d, MOD A2C: 112.0 ml LV vol d, MOD A4C: 107.0 ml LV vol s, MOD A2C: 33.5 ml LV vol s, MOD A4C: 30.2 ml LV SV MOD A2C:     78.5 ml LV SV MOD A4C:     107.0 ml LV SV MOD BP:      77.0 ml RIGHT VENTRICLE            IVC RV Basal diam:  2.20 cm    IVC diam: 1.10 cm RV S prime:     7.70 cm/s TAPSE (M-mode): 1.5 cm LEFT ATRIUM             Index        RIGHT ATRIUM           Index LA diam:        4.20 cm 1.82 cm/m   RA Area:     15.00 cm LA Vol (A2C):   56.9 ml 24.71 ml/m  RA Volume:   29.60 ml  12.85 ml/m LA Vol (A4C):   31.3 ml 13.59 ml/m LA Biplane Vol: 42.8 ml 18.59 ml/m  AORTIC VALVE AV Area (Vmax): 2.73 cm AV Vmax:        105.00 cm/s AV Peak Grad:   4.4 mmHg LVOT Vmax:      82.70 cm/s LVOT Vmean:     50.500 cm/s LVOT VTI:        0.147 m  AORTA Ao Root diam: 3.10 cm Ao Asc diam:  3.50 cm MITRAL VALVE MV Area (PHT): 4.10 cm    SHUNTS MV Decel Time: 185 msec    Systemic VTI:  0.15 m MV E velocity: 75.30 cm/s  Systemic Diam: 2.10 cm Riley Lam MD Electronically signed by Riley Lam MD Signature Date/Time: 10/23/2022/12:43:39 PM    Final    MR BRAIN WO CONTRAST  Result Date: 10/23/2022 CLINICAL DATA:  72 year old male code stroke presentation yesterday. Acute left M1 occlusion. Status post endovascular revascularization. Mild subarachnoid hemorrhage suspected postprocedural. EXAM: MRI HEAD WITHOUT CONTRAST TECHNIQUE: Multiplanar, multiecho pulse sequences of the brain and surrounding structures were obtained without intravenous contrast. COMPARISON:  CTA, CT head, and CTP yesterday. No previous brain MRI. FINDINGS: Brain: Confluent restricted diffusion throughout the left basal ganglia (series 5, image 77) and patchy additional scattered cortical and white matter diffusion restriction, mostly in the middle sylvian division. Underlying chronic left MCA territory infarct with encephalomalacia mostly in the posterior division. Cytotoxic edema in the affected areas.Blood  products on SWI which seem more related with the chronic encephalomalacia than acutely within subarachnoid spaces. And no IVH is identified. No midline shift. Only mild mass effect on the left lateral ventricle from the left basal ganglia. No posterior fossa diffusion restriction, but there is punctate restricted diffusion in the contralateral right caudate (series 7, image 56). Small chronic right cerebellar infarct. Cervicomedullary junction and pituitary are within normal limits. Vascular: Major intracranial vascular flow voids are preserved proximally, although the left MCA appears highly asymmetric on both T2 and FLAIR imaging suggesting abnormal flow persists (series 11, image 22). Skull and upper cervical spine: Visualized bone marrow signal is within  normal limits. Negative for age visible cervical spine. Sinuses/Orbits: Postoperative changes to the right globe. Paranasal Visualized paranasal sinuses and mastoids are stable and well aerated. Other: Visible internal auditory structures appear normal. IMPRESSION: 1. Evidence of ongoing abnormal flow in the Left MCA on this routine noncontrast exam. 2. Left MCA territory acute infarcts most confluent in the left basal ganglia. No significant subarachnoid blood by MRI, head CT would be more sensitive. But chronic left MCA territory encephalomalacia with abundant hemosiderin. No significant intracranial mass effect. 3. Superimposed punctate acute infarct in the contralateral right caudate. Electronically Signed   By: Odessa Fleming M.D.   On: 10/23/2022 04:59   CT C-SPINE NO CHARGE  Result Date: 10/22/2022 CLINICAL DATA:  Code stroke EXAM: CT CERVICAL SPINE WITHOUT CONTRAST TECHNIQUE: Multidetector CT imaging of the cervical spine was performed without intravenous contrast. Multiplanar CT image reconstructions were also generated. RADIATION DOSE REDUCTION: This exam was performed according to the departmental dose-optimization program which includes automated exposure control, adjustment of the mA and/or kV according to patient size and/or use of iterative reconstruction technique. COMPARISON:  Cervical spine CT 02/09/2021 FINDINGS: Alignment: Normal. There is no jumped or perched facet or other evidence of traumatic malalignment. Skull base and vertebrae: Skull base alignment is maintained. Vertebral body heights are preserved. There is no evidence of acute fracture. There are bulky anterior osteophytes throughout the cervical spine. Soft tissues and spinal canal: No prevertebral fluid or swelling. No visible canal hematoma. Disc levels: Bulky anterior osteophytes throughout the cervical spine are again seen. 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 Upper chest: The imaged lung apices are clear. Other: None. IMPRESSION: No acute fracture or traumatic malalignment of the cervical spine Electronically Signed   By: Lesia Hausen M.D.   On: 10/22/2022 11:12   CT ANGIO HEAD NECK W WO CM W PERF (CODE STROKE)  Result Date: 10/22/2022 CLINICAL DATA:  Code stroke EXAM: CT ANGIOGRAPHY HEAD AND NECK CT PERFUSION BRAIN TECHNIQUE: Multidetector CT imaging of the head and neck was performed using the standard protocol during bolus administration of intravenous contrast. Multiplanar CT image reconstructions and MIPs were obtained to evaluate the vascular anatomy. Carotid stenosis measurements (when applicable) are obtained utilizing NASCET criteria, using the distal internal carotid diameter as the denominator. Multiphase CT imaging of the brain was performed following IV bolus contrast injection. Subsequent parametric perfusion maps were calculated using RAPID software. RADIATION DOSE REDUCTION: This exam was performed according to the departmental dose-optimization program which includes automated exposure control, adjustment of the mA and/or kV according to patient size and/or use of iterative reconstruction technique. CONTRAST:  OMNIPAQUE IOHEXOL 350 MG/ML SOLN COMPARISON:  None Available. Same-day noncontrast CT head FINDINGS: CTA NECK FINDINGS Aortic arch: The imaged aortic arch  is normal. The origins of the major branch vessels are patent. The subclavian arteries are patent to the level imaged. Right carotid system: The right common, internal, and external carotid arteries are patent, without hemodynamically significant stenosis or occlusion there is no evidence of dissection or aneurysm. Left carotid system: The left common, internal, and external carotid arteries are patent, with minimal plaque at the bifurcation but no hemodynamically significant stenosis or occlusion. There is no evidence of dissection or aneurysm. Vertebral arteries: The  vertebral arteries are patent, without hemodynamically significant stenosis or occlusion there is no evidence of dissection or aneurysm. Skeleton: The cervical spine is assessed in full on the separately dictated cervical spine CT. Other neck: The soft tissues of the neck are unremarkable. Upper chest: The imaged lung apices are clear. Review of the MIP images confirms the above findings CTA HEAD FINDINGS Anterior circulation: The intracranial ICAs are patent, with mild stenosis of the communicating segment on the right. There is acute occlusion of the distal left M1 segment with some reconstitution of flow in distal branches. The right M1 segment and distal branches are patent The bilateral ACAS are patent, without proximal stenosis or occlusion. The right A1 segment is hypoplastic. The anterior communicating artery is normal. There is no aneurysm or AVM. Posterior circulation: The bilateral V4 segments are patent. The basilar artery is patent. The major cerebellar arteries appear patent. The bilateral PCAs are patent, without proximal stenosis or occlusion. There is a diminutive left posterior communicating artery with a probable infundibular origin. No definite right posterior communicating artery is identified. There is no aneurysm or AVM. Venous sinuses: Patent. Anatomic variants: As above. Review of the MIP images confirms the above findings CT Brain Perfusion Findings: ASPECTS: 9 CBF (<30%) Volume: 0mL Perfusion (Tmax>6.0s) volume: 65mL Mismatch Volume: 65mL Infarction Location:The penumbra is identified in the left MCA distribution. IMPRESSION: 1. Acute left M1 occlusion with some reconstitution of flow in distal branches. 2. 65 cc ischemic penumbra in the MCA distribution. No infarct core identified. 3. No other hemodynamically significant stenosis or occlusion in the vasculature of the head or neck. These results were called by telephone at the time of interpretation on 10/22/2022 at 10:56 am to provider Dr  Trilby Leaver, who verbally acknowledged these results. Electronically Signed   By: Lesia Hausen M.D.   On: 10/22/2022 11:08   CT HEAD CODE STROKE WO CONTRAST  Result Date: 10/22/2022 CLINICAL DATA:  Code stroke.  No localizing sign provided EXAM: CT HEAD WITHOUT CONTRAST TECHNIQUE: Contiguous axial images were obtained from the base of the skull through the vertex without intravenous contrast. RADIATION DOSE REDUCTION: This exam was performed according to the departmental dose-optimization program which includes automated exposure control, adjustment of the mA and/or kV according to patient size and/or use of iterative reconstruction technique. COMPARISON:  CT Head 02/09/21 FINDINGS: Brain: Redemonstrated is a chronic left MCA territory infarct involving the insula and anterior temporal lobe. Compared to prior exam there is new hypodensity in the left basal ganglia (series 2, image 20). No evidence of hemorrhage, hydrocephalus, extra-axial collection or mass lesion/mass effect. Vascular: No hyperdense vessel or unexpected calcification. Skull: Normal. Negative for fracture or focal lesion. Sinuses/Orbits: No middle ear effusion. Trace left mastoid effusion. Paranasal sinuses are clear. Right lens replacement and a right scleral band. Orbits are otherwise unremarkable. Other: None. ASPECTS [facility] Stroke Program Early CT Score): 9 when accounting for chronic findings. IMPRESSION: 1. Possible new age indeterminate infarcts in the left basal ganglia. Aspects is  9 when accounting for chronic findings. 2. Chronic left MCA territory infarct. Findings were paged to Dr. Viviann Spare on 10/22/2022 at 10:31 a.m. Electronically Signed   By: Lorenza Cambridge M.D.   On: 10/22/2022 10:31    Labs:  CBC: Recent Labs    10/14/22 0848 10/22/22 1016 10/22/22 1026 10/23/22 0618  WBC 3.7* 4.8  --  5.9  HGB 14.2 15.0 15.3 13.1  HCT 43.2 45.0 45.0 37.9*  PLT 195 121*  --  127*    COAGS: Recent Labs    10/22/22 1016  INR 1.2   APTT 27    BMP: Recent Labs    10/14/22 0848 10/22/22 1026 10/23/22 0618  NA 141 138 135  K 4.7 4.3 4.7  CL 109 108 102  CO2 23  --  20*  GLUCOSE 78 102* 105*  BUN 25* 20 15  CALCIUM 9.6  --  8.6*  CREATININE 1.32* 1.40* 1.36*  GFRNONAA 58*  --  56*    LIVER FUNCTION TESTS: No results for input(s): "BILITOT", "AST", "ALT", "ALKPHOS", "PROT", "ALBUMIN" in the last 8760 hours.  Assessment and Plan:  Left Middle Cerebral artery revascularization in NIR 8/9 Left side moving better Words forming improved Plans per Stroke Team  Electronically Signed: Robet Leu, PA-C 10/24/2022, 7:47 AM   I spent a total of 15 Minutes at the the patient's bedside AND on the patient's hospital floor or unit, greater than 50% of which was counseling/coordinating care for L MCA revasc

## 2022-10-25 ENCOUNTER — Other Ambulatory Visit: Payer: Self-pay

## 2022-10-25 ENCOUNTER — Inpatient Hospital Stay (HOSPITAL_COMMUNITY): Payer: No Typology Code available for payment source

## 2022-10-25 DIAGNOSIS — R569 Unspecified convulsions: Secondary | ICD-10-CM | POA: Diagnosis not present

## 2022-10-25 DIAGNOSIS — I639 Cerebral infarction, unspecified: Secondary | ICD-10-CM | POA: Diagnosis not present

## 2022-10-25 LAB — BASIC METABOLIC PANEL
Anion gap: 12 (ref 5–15)
BUN: 24 mg/dL — ABNORMAL HIGH (ref 8–23)
CO2: 17 mmol/L — ABNORMAL LOW (ref 22–32)
Calcium: 8.9 mg/dL (ref 8.9–10.3)
Chloride: 103 mmol/L (ref 98–111)
Creatinine, Ser: 1.41 mg/dL — ABNORMAL HIGH (ref 0.61–1.24)
GFR, Estimated: 54 mL/min — ABNORMAL LOW (ref >60)
Glucose, Bld: 119 mg/dL — ABNORMAL HIGH (ref 70–99)
Potassium: 4.5 mmol/L (ref 3.5–5.1)
Sodium: 132 mmol/L — ABNORMAL LOW (ref 135–145)

## 2022-10-25 LAB — CBC
HCT: 42.4 % (ref 39.0–52.0)
Hemoglobin: 14.5 g/dL (ref 13.0–17.0)
MCH: 30.1 pg (ref 26.0–34.0)
MCHC: 34.2 g/dL (ref 30.0–36.0)
MCV: 88 fL (ref 80.0–100.0)
Platelets: 131 10*3/uL — ABNORMAL LOW (ref 150–400)
RBC: 4.82 MIL/uL (ref 4.22–5.81)
RDW: 14.3 % (ref 11.5–15.5)
WBC: 5.9 10*3/uL (ref 4.0–10.5)
nRBC: 0 % (ref 0.0–0.2)

## 2022-10-25 LAB — GLUCOSE, CAPILLARY: Glucose-Capillary: 103 mg/dL — ABNORMAL HIGH (ref 70–99)

## 2022-10-25 MED ORDER — SODIUM CHLORIDE 0.9 % IV SOLN: INTRAVENOUS | Status: DC

## 2022-10-25 MED ORDER — RIVAROXABAN 20 MG PO TABS
20.0000 mg | ORAL_TABLET | Freq: Every day | ORAL | Status: DC
  Filled 2022-10-25: qty 1

## 2022-10-25 MED ORDER — SODIUM CHLORIDE 0.9 % IV BOLUS
500.0000 mL | Freq: Once | INTRAVENOUS | Status: AC
  Administered 2022-10-25: 500 mL via INTRAVENOUS

## 2022-10-25 NOTE — Significant Event (Signed)
Rapid Response Event Note   Reason for Call :  Called to bedside for neuro change. Pt non-verbal on my arrival. He was lethargic and unable to answer LOC questions.  Per family at bedside, pt was talking on the phone yesterday. R sided weakness at baseline. L sided weakness noted on initial exam but returned back to baseline prior to event end.   Per RN, pt was up in chair eating earlier today without issues. She notes pt non-verbal since shift change (Other than one word spoken while finished eating) and was informed that was his baseline from night shift.   Vitals: HR 71, BP 111/85, RR 20, spO2 100% on RA CBG 103  Dr Pearlean Brownie notified and to bedside. STAT CT head and bolus ordered.   Frank Rasmussen, RN

## 2022-10-25 NOTE — Progress Notes (Incomplete)
PMR Admission Coordinator Pre-Admission Assessment  Patient: Frank Barron is an 72 y.o., male MRN: [id number] DOB: 1950/04/14 Height: 6\' 3"  (190.5 cm) Weight: 101.6 kg              Insurance Information HMO:     PPO:      PCP:      IPA:      80/20:      OTHER:  PRIMARY: [facility]       Policy#: [id number]      Subscriber: pt CM Name: Cassie      Phone#: ***     Fax#: *** Pre-Cert#: [id number]       Employer:  Benefits:  Phone #: ***     Name:  Eff. Date: 08/12/17     Deduct: n/a      Out of Pocket Max: n/a      Life Max: n/a  CIR: 100%      SNF: 100% Outpatient: 100%     Co-Pay:  Home Health: 100%      Co-Pay:  DME: 100%     Co-Pay:  Providers:  SECONDARY: [employer]       Policy#: [id number]       Phone#: [phone]  Financial Counselor:       Phone#:   The "Data Collection Information Summary" for patients in Inpatient Rehabilitation Facilities with attached "Privacy Act Statement-Health Care Records" was provided and verbally reviewed with: Patient and Family  Emergency Contact Information Contact Information     Name Relation Home Work Mobile   [place] Brother [phone]     Edythe Clarity Daughter   [phone]      Other Contacts     Name Relation Home Work Mobile   [place] Sister [phone]     Sears, Haag Significant other   [phone]      Current Medical History  Patient Admitting Diagnosis: CVA  History of Present Illness: Pt is a 72 y/o male with 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  In ED BP 165/133, otherwise vitals normal, labs notable for creatinine 1.40, albumin 3.2, CT head showed possible new age indeterminate infarcts in the left basal ganglia.  NIH of 17.  CTA showed L MCA occlusion and pt was taken to IR for revascularization.   He did require cleviprex gtt for tight BP  control.  Therapy evaluations were completed and pt was recommended for CIR>   Complete NIHSS TOTAL: 16 Glasgow Coma Scale Score: 13  Patient's medical record from [facility] has been reviewed by the rehabilitation admission coordinator and physician.  Past Medical History  Past Medical History:  Diagnosis Date   Anemia    Anxiety    Arthritis    Atrial fibrillation (HCC)    CAD (coronary artery disease)    PCI to LAD 2012   CHF (congestive heart failure) (HCC)    Depression    /notes 07/19/2005 (09/30/2012)   Hepatitis C    pt denies this hx on 09/30/2012   High cholesterol    History of DVT of lower extremity    "left; long time ago" (09/30/2012)   Hypertension    Prostate cancer (HCC)    Renal cell carcinoma (HCC)    Stroke (HCC) 11/18/2011   [name] 07/31/2012; expressive aphasia noted on 09/30/2012    Has the patient had major surgery during 100 days  prior to admission? Yes  Family History  family history includes Deep vein thrombosis in his mother; Hypertension in his mother.   Current Medications   Current Facility-Administered Medications:     stroke: early stages of recovery book, , Does not apply, Once, Toberman, Stevi W, NP   [COMPLETED] sodium chloride 0.9 % bolus 500 mL, 500 mL, Intravenous, Once, Last Rate: 250 mL/hr at 10/25/22 1331, 500 mL at 10/25/22 1331 **FOLLOWED BY** 0.9 %  sodium chloride infusion, , Intravenous, Continuous, Artis Flock, Denise A, NP   acetaminophen (TYLENOL) tablet 650 mg, 650 mg, Oral, Q4H PRN **OR** acetaminophen (TYLENOL) 160 MG/5ML solution 650 mg, 650 mg, Per Tube, Q4H PRN **OR** acetaminophen (TYLENOL) suppository 650 mg, 650 mg, Rectal, Q4H PRN, Cindie Laroche, Stevi W, NP   amLODipine (NORVASC) tablet 10 mg, 10 mg, Oral, Daily, Gevena Mart A, NP, 10 mg at 10/25/22 1059   Chlorhexidine Gluconate Cloth 2 % PADS 6 each, 6 each, Topical, Daily, Marvel Plan, MD, 6 each at 10/24/22 1519   escitalopram (LEXAPRO) tablet 10 mg, 10 mg, Oral, Daily,  Gevena Mart A, NP, 10 mg at 10/25/22 1059   hydrALAZINE (APRESOLINE) injection 10-20 mg, 10-20 mg, Intravenous, Q6H PRN, Gevena Mart A, NP   labetalol (NORMODYNE) injection 10-20 mg, 10-20 mg, Intravenous, Q2H PRN, Mathews Argyle, NP   Oral care mouth rinse, 15 mL, Mouth Rinse, PRN, Marvel Plan, MD   Melene Muller ON 10/26/2022] rivaroxaban (XARELTO) tablet 20 mg, 20 mg, Oral, Q breakfast, Gevena Mart A, NP   rosuvastatin (CRESTOR) tablet 40 mg, 40 mg, Oral, Daily, Gevena Mart A, NP, 40 mg at 10/25/22 1059   senna-docusate (Senokot-S) tablet 1 tablet, 1 tablet, Oral, QHS PRN, Kara Mead, NP   tamsulosin (FLOMAX) capsule 0.4 mg, 0.4 mg, Oral, Daily, Gevena Mart A, NP, 0.4 mg at 10/25/22 1059   thiamine (VITAMIN B1) tablet 100 mg, 100 mg, Oral, Daily, Gevena Mart A, NP, 100 mg at 10/25/22 1059  Patients Current Diet:  Diet Order             DIET DYS 2 Room service appropriate? No; Fluid consistency: Thin  Diet effective now                   Precautions / Restrictions Precautions Precautions: Fall, Other (comment) Precaution Comments: R hemiplegia, aphasia Restrictions Weight Bearing Restrictions: No   Has the patient had 2 or more falls or a fall with injury in the past year?Yes  Prior Activity Level Limited Community (1-2x/wk): independent prior to admit, living with spouse who has her own health issues  Prior Functional Level Prior Function Prior Level of Function : Patient poor historian/Family not available  Self Care: Did the patient need help bathing, dressing, using the toilet or eating?  Independent  Indoor Mobility: Did the patient need assistance with walking from room to room (with or without device)? Independent  Stairs: Did the patient need assistance with internal or external stairs (with or without device)? Independent  Functional Cognition: Did the patient need help planning regular tasks such as shopping or remembering to take medications?  Independent  Patient Information Are you of Hispanic, Latino/a,or Spanish origin?: X. Patient unable to respond, A. No, not of Hispanic, Latino/a, or Spanish origin (proxy) What is your race?: X. Patient unable to respond, B. Black or African American (proxy) Do you need or want an interpreter to communicate with a doctor or health care staff?: 9. Unable to respond  Patient's Response To:  Health  Literacy and Transportation Is the patient able to respond to health literacy and transportation needs?: No Health Literacy - How often do you need to have someone help you when you read instructions, pamphlets, or other written material from your doctor or pharmacy?: Patient unable to respond  Home Assistive Devices / Equipment Home Assistive Devices/Equipment: Blood pressure cuff, CBG Meter, Shower chair with back, Walker (specify type), Cane (specify quad or straight)  Prior Device Use: Indicate devices/aids used by the patient prior to current illness, exacerbation or injury? Walker  Current Functional Level Cognition  Arousal/Alertness: Awake/alert Overall Cognitive Status: Difficult to assess Difficult to assess due to: Impaired communication Orientation Level: Oriented to person, Disoriented to place, Disoriented to time, Disoriented to situation Following Commands: Follows one step commands inconsistently General Comments: Pt with expressive aphasia, fairly accurate appearing yes/no questions though family not present to confirm PLOF. consistent command following. shows some insight into deficits (shaking head when asked if he could move RUE, indicating being wet in bed) Awareness: Impaired Awareness Impairment: Emergent impairment Problem Solving: Impaired Problem Solving Impairment: Verbal basic    Extremity Assessment (includes Sensation/Coordination)  Upper Extremity Assessment: RUE deficits/detail, Left hand dominant RUE Deficits / Details: unable to move UE on command, some  involuntary/automatic movements, when yawning, pt R UE automatically bending and lifting towards face. nods head yes when asked if he could feel RUE RUE Coordination: decreased fine motor, decreased gross motor  Lower Extremity Assessment: Defer to PT evaluation RLE Deficits / Details: Grossly 1/5, able to perform limited heel slide and wiggle toes. Dorsiflexion ROM limited. Increased tone    ADLs  Overall ADL's : Needs assistance/impaired Eating/Feeding: Set up, Sitting, Supervision/ safety Eating/Feeding Details (indicate cue type and reason): assist to open containers, able to answer yes/no to if pt would like salt/pepper, etc on certain items. minor cues to take smaller bites but pt able to self feed with LUE well Grooming: Minimal assistance, Sitting Upper Body Bathing: Maximal assistance, Sitting Lower Body Bathing: Maximal assistance, +2 for physical assistance, +2 for safety/equipment, Sit to/from stand Upper Body Dressing : Maximal assistance Lower Body Dressing: Maximal assistance, +2 for physical assistance, +2 for safety/equipment, Sit to/from stand Toileting- Clothing Manipulation and Hygiene: Total assistance, Bed level Toileting - Clothing Manipulation Details (indicate cue type and reason): for clean up due to primofit malfunction General ADL Comments: Pt with R nondominant UE and RLE weakness, questionable vision impairments requiring significant amount of assist to complete ADLs    Mobility  Overal bed mobility: Needs Assistance Bed Mobility: Rolling, Sidelying to Sit Rolling: Min assist Sidelying to sit: Mod assist, +2 for safety/equipment Supine to sit: +2 for physical assistance, Min assist Sit to supine: Min assist, +2 for physical assistance General bed mobility comments: use of bedrail to pull self to L side for cleanup. assisted to EOB from this position w/ assist needed to get BLE off of bed and MinA to lift trunk    Transfers  Overall transfer level: Needs  assistance Equipment used: Ambulation equipment used Transfers: Sit to/from Stand, Bed to chair/wheelchair/BSC Sit to Stand: Mod assist, +2 safety/equipment, +2 physical assistance Bed to/from chair/wheelchair/BSC transfer type:: Via Lift equipment Transfer via Lift Equipment: Stedy General transfer comment: Mod A x2 to stand in [place] (increased assist on R weaker side) and transferred to chair with Stedy. Pt able to stand from lower recliner as well with same assist level.    Ambulation / Gait / Stairs / Psychologist, prison and probation services  Posture / Balance Dynamic Sitting Balance Sitting balance - Comments: Supervision -min guard; increased left lateral lean following transfer Balance Overall balance assessment: Needs assistance Sitting-balance support: Feet supported Sitting balance-Leahy Scale: Fair Sitting balance - Comments: Supervision -min guard; increased left lateral lean following transfer Standing balance support: Single extremity supported, During functional activity Standing balance-Leahy Scale: Poor    Special needs/care consideration Diabetic management yes     Previous Home Environment (from acute therapy documentation) Living Arrangements: Alone Home Care Services: No Additional Comments: pt unable to provide due to aphasia. pt noted w/ wedding band on- when asked with yes/no questions - pt reports being married for 30 years though no spouse listed in chart  Discharge Living Setting Plans for Discharge Living Setting: Patient's home, Lives with (comment) (spouse) Type of Home at Discharge: Apartment Discharge Home Layout: One level Discharge Home Access: Level entry, Elevator Discharge Bathroom Shower/Tub: Tub/shower unit Discharge Bathroom Toilet: Standard Discharge Bathroom Accessibility: Yes How Accessible: Accessible via walker Does the patient have any problems obtaining your medications?: No  Social/Family/Support Systems Patient Roles: Spouse Anticipated  Caregiver: contact is pt's brother, [name] Anticipated Industrial/product designer Information: [phone] Ability/Limitations of Caregiver: family cannot provide 24/7, will rely on assist from [place] to assist with filling in the caregiver gaps, per Helmut Muster [phone] ext [phone] rehab CSW to call when d/c date set so they can set it up. Caregiver Availability: 24/7 Discharge Plan Discussed with Primary Caregiver: Yes Is Caregiver In Agreement with Plan?: Yes Does Caregiver/Family have Issues with Lodging/Transportation while Pt is in Rehab?: No   Goals Patient/Family Goal for Rehab: PT/OT supervision to min assist, SLP supervision Expected length of stay: 14-18 days Additional Information: Discharge plan: return home with spouse, family and [facility] caregivers Pt/Family Agrees to Admission and willing to participate: Yes Program Orientation Provided & Reviewed with Pt/Caregiver Including Roles  & Responsibilities: Yes Additional Information Needs: CSW to notify Helmut Muster [phone] ext [phone] of CIR d/c date to help set up caregivers  Barriers to Discharge: Decreased caregiver support   Decrease burden of Care through IP rehab admission: n/a   Possible need for SNF placement upon discharge:Not anticipated.  Plan for d/c home with spouse and 24/7 between family and [facility] support.    Patient Condition: I have reviewed medical records from [facility], spoken with toc team, and daughter and family member. I discussed via phone for inpatient rehabilitation assessment.  Patient will benefit from ongoing PT, OT, and SLP, can actively participate in 3 hours of therapy a day 5 days of the week, and can make measurable gains during the admission.  Patient will also benefit from the coordinated team approach during an Inpatient Acute Rehabilitation admission.  The patient will receive intensive therapy as well as Rehabilitation physician, nursing, social worker, and care management interventions.  Due to bladder  management, bowel management, safety, skin/wound care, disease management, medication administration, pain management, and patient education the patient requires 24 hour a day rehabilitation nursing.  The patient is currently mod to max assist +2 with mobility and basic ADLs.  Discharge setting and therapy post discharge at home with home health is anticipated.  Patient has agreed to participate in the Acute Inpatient Rehabilitation Program and will admit pending insurance approval ***  Preadmission Screen Completed By:  Stephania Fragmin, PT, 10/25/2022 3:50 PM

## 2022-10-25 NOTE — Progress Notes (Signed)
Nephew came to desk and said something is wrong with his uncle.  Said he was up in the chair and was incont of stool.  York Spaniel was not responding.  Upon entering room, found pt with a blank stare and was not following commands.  He did respond to my voice a couple times and would weakly squeeze my hand.  He was not talking but I informed family that the speech was his baseline this morning and what the night shift nurse reported to me.  I even read the doctor's note that spoke of his dysphagia and inability to speak. Pt's nephew and sister said he was talking on the phone to family and pastor from ICU yesterday.  I did a complete NIHSS and it was 18 and had been 15 previously.  VS were stable and blood sugar was unremarkable.  I did page MD, and rapid response.  MD came to bedside and confirmed with family and nurse that this was the same exam that he had earlier, as he had just been at the bedside 45 minutes prior and his assessment of him was unchanged, as pt was following commands for Dr Pearlean Brownie and had expressive aphasia, just as he had on his earlier exam.  Dr did not feel a code stroke was warranted at that time and said to send him down for a stat CT and he also ordered a 500 ml NS bolus.  This nurse, along with RR nurse took him down to the CT and upon his return he was tracking and answering questions with yes/no answers, as was on my initial assessment this morning.  His NIHSS is back to 15.  Dr did say that he would also order an EEG.  Family at bedside and was notified and were happy that he had returned to his baseline and was more interactive with them,

## 2022-10-25 NOTE — Progress Notes (Signed)
EEG complete - results pending 

## 2022-10-25 NOTE — PMR Pre-admission (Shared)
PMR Admission Coordinator Pre-Admission Assessment  Patient: Frank Barron is an 72 y.o., male MRN: [id number] DOB: 07-17-1950 Height: 6\' 3"  (190.5 cm) Weight: 101.6 kg              Insurance Information HMO:     PPO:      PCP:      IPA:      80/20:      OTHER:  PRIMARY: [facility]       Policy#: [id number]      Subscriber: pt CM Name: Cassie      Phone#: ***     Fax#: *** Pre-Cert#: [id number]       Employer:  Benefits:  Phone #: ***     Name:  Eff. Date: 08/12/17     Deduct: n/a      Out of Pocket Max: n/a      Life Max: n/a  CIR: 100%      SNF: 100% Outpatient: 100%     Co-Pay:  Home Health: 100%      Co-Pay:  DME: 100%     Co-Pay:  Providers:  SECONDARY: [employer]       Policy#: [id number]       Phone#: [phone]  Financial Counselor:       Phone#:   The "Data Collection Information Summary" for patients in Inpatient Rehabilitation Facilities with attached "Privacy Act Statement-Health Care Records" was provided and verbally reviewed with: Patient and Family  Emergency Contact Information Contact Information     Name Relation Home Work Mobile   [place] Brother [phone]     Edythe Clarity Daughter   [phone]      Other Contacts     Name Relation Home Work Mobile   [place] Sister [phone]     Burech, Mar[place] Significant other   [phone]      Current Medical History  Patient Admitting Diagnosis: CVA  History of Present Illness: Pt is a 72 y/o male with 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  In ED BP 165/133, otherwise vitals normal, labs notable for creatinine 1.40, albumin 3.2, CT head showed possible new age indeterminate infarcts in the left basal ganglia.  NIH of 18.  CTA showed L MCA occlusion and pt was taken to IR for revascularization.   He did require cleviprex gtt for tight BP  control.  Therapy evaluations were completed and pt was recommended for CIR>   Complete NIHSS TOTAL: 16 Glasgow Coma Scale Score: 13  Patient's medical record from [facility] has been reviewed by the rehabilitation admission coordinator and physician.  Past Medical History  Past Medical History:  Diagnosis Date   Anemia    Anxiety    Arthritis    Atrial fibrillation (HCC)    CAD (coronary artery disease)    PCI to LAD 2012   CHF (congestive heart failure) (HCC)    Depression    /notes 07/19/2005 (09/30/2012)   Hepatitis C    pt denies this hx on 09/30/2012   High cholesterol    History of DVT of lower extremity    "left; long time ago" (09/30/2012)   Hypertension    Prostate cancer (HCC)    Renal cell carcinoma (HCC)    Stroke (HCC) 11/18/2011   [name] 07/31/2012; expressive aphasia noted on 09/30/2012    Has the patient had major surgery during 100 days  prior to admission? Yes  Family History  family history includes Deep vein thrombosis in his mother; Hypertension in his mother.   Current Medications   Current Facility-Administered Medications:     stroke: early stages of recovery book, , Does not apply, Once, Toberman, Stevi W, NP   [COMPLETED] sodium chloride 0.9 % bolus 500 mL, 500 mL, Intravenous, Once, Last Rate: 250 mL/hr at 10/25/22 1331, 500 mL at 10/25/22 1331 **FOLLOWED BY** 0.9 %  sodium chloride infusion, , Intravenous, Continuous, Artis Flock, Denise A, NP   acetaminophen (TYLENOL) tablet 650 mg, 650 mg, Oral, Q4H PRN **OR** acetaminophen (TYLENOL) 160 MG/5ML solution 650 mg, 650 mg, Per Tube, Q4H PRN **OR** acetaminophen (TYLENOL) suppository 650 mg, 650 mg, Rectal, Q4H PRN, Cindie Laroche, Stevi W, NP   amLODipine (NORVASC) tablet 10 mg, 10 mg, Oral, Daily, Gevena Mart A, NP, 10 mg at 10/25/22 1059   Chlorhexidine Gluconate Cloth 2 % PADS 6 each, 6 each, Topical, Daily, Marvel Plan, MD, 6 each at 10/24/22 1519   escitalopram (LEXAPRO) tablet 10 mg, 10 mg, Oral, Daily,  Gevena Mart A, NP, 10 mg at 10/25/22 1059   hydrALAZINE (APRESOLINE) injection 10-20 mg, 10-20 mg, Intravenous, Q6H PRN, Gevena Mart A, NP   labetalol (NORMODYNE) injection 10-20 mg, 10-20 mg, Intravenous, Q2H PRN, Mathews Argyle, NP   Oral care mouth rinse, 15 mL, Mouth Rinse, PRN, Marvel Plan, MD   Melene Muller ON 10/26/2022] rivaroxaban (XARELTO) tablet 20 mg, 20 mg, Oral, Q breakfast, Gevena Mart A, NP   rosuvastatin (CRESTOR) tablet 40 mg, 40 mg, Oral, Daily, Gevena Mart A, NP, 40 mg at 10/25/22 1059   senna-docusate (Senokot-S) tablet 1 tablet, 1 tablet, Oral, QHS PRN, Kara Mead, NP   tamsulosin (FLOMAX) capsule 0.4 mg, 0.4 mg, Oral, Daily, Gevena Mart A, NP, 0.4 mg at 10/25/22 1059   thiamine (VITAMIN B1) tablet 100 mg, 100 mg, Oral, Daily, Gevena Mart A, NP, 100 mg at 10/25/22 1059  Patients Current Diet:  Diet Order             DIET DYS 2 Room service appropriate? No; Fluid consistency: Thin  Diet effective now                   Precautions / Restrictions Precautions Precautions: Fall, Other (comment) Precaution Comments: R hemiplegia, aphasia Restrictions Weight Bearing Restrictions: No   Has the patient had 2 or more falls or a fall with injury in the past year?Yes  Prior Activity Level Limited Community (1-2x/wk): independent prior to admit, living with spouse who has her own health issues  Prior Functional Level Prior Function Prior Level of Function : Patient poor historian/Family not available  Self Care: Did the patient need help bathing, dressing, using the toilet or eating?  Independent  Indoor Mobility: Did the patient need assistance with walking from room to room (with or without device)? Independent  Stairs: Did the patient need assistance with internal or external stairs (with or without device)? Independent  Functional Cognition: Did the patient need help planning regular tasks such as shopping or remembering to take medications?  Independent  Patient Information Are you of Hispanic, Latino/a,or Spanish origin?: X. Patient unable to respond, A. No, not of Hispanic, Latino/a, or Spanish origin (proxy) What is your race?: X. Patient unable to respond, B. Black or African American (proxy) Do you need or want an interpreter to communicate with a doctor or health care staff?: 9. Unable to respond  Patient's Response To:  Health  Literacy and Transportation Is the patient able to respond to health literacy and transportation needs?: No Health Literacy - How often do you need to have someone help you when you read instructions, pamphlets, or other written material from your doctor or pharmacy?: Patient unable to respond  Home Assistive Devices / Equipment Home Assistive Devices/Equipment: Blood pressure cuff, CBG Meter, Shower chair with back, Walker (specify type), Cane (specify quad or straight)  Prior Device Use: Indicate devices/aids used by the patient prior to current illness, exacerbation or injury? Walker  Current Functional Level Cognition  Arousal/Alertness: Awake/alert Overall Cognitive Status: Difficult to assess Difficult to assess due to: Impaired communication Orientation Level: Oriented to person, Disoriented to place, Disoriented to time, Disoriented to situation Following Commands: Follows one step commands inconsistently General Comments: Pt with expressive aphasia, fairly accurate appearing yes/no questions though family not present to confirm PLOF. consistent command following. shows some insight into deficits (shaking head when asked if he could move RUE, indicating being wet in bed) Awareness: Impaired Awareness Impairment: Emergent impairment Problem Solving: Impaired Problem Solving Impairment: Verbal basic    Extremity Assessment (includes Sensation/Coordination)  Upper Extremity Assessment: RUE deficits/detail, Left hand dominant RUE Deficits / Details: unable to move UE on command, some  involuntary/automatic movements, when yawning, pt R UE automatically bending and lifting towards face. nods head yes when asked if he could feel RUE RUE Coordination: decreased fine motor, decreased gross motor  Lower Extremity Assessment: Defer to PT evaluation RLE Deficits / Details: Grossly 1/5, able to perform limited heel slide and wiggle toes. Dorsiflexion ROM limited. Increased tone    ADLs  Overall ADL's : Needs assistance/impaired Eating/Feeding: Set up, Sitting, Supervision/ safety Eating/Feeding Details (indicate cue type and reason): assist to open containers, able to answer yes/no to if pt would like salt/pepper, etc on certain items. minor cues to take smaller bites but pt able to self feed with LUE well Grooming: Minimal assistance, Sitting Upper Body Bathing: Maximal assistance, Sitting Lower Body Bathing: Maximal assistance, +2 for physical assistance, +2 for safety/equipment, Sit to/from stand Upper Body Dressing : Maximal assistance Lower Body Dressing: Maximal assistance, +2 for physical assistance, +2 for safety/equipment, Sit to/from stand Toileting- Clothing Manipulation and Hygiene: Total assistance, Bed level Toileting - Clothing Manipulation Details (indicate cue type and reason): for clean up due to primofit malfunction General ADL Comments: Pt with R nondominant UE and RLE weakness, questionable vision impairments requiring significant amount of assist to complete ADLs    Mobility  Overal bed mobility: Needs Assistance Bed Mobility: Rolling, Sidelying to Sit Rolling: Min assist Sidelying to sit: Mod assist, +2 for safety/equipment Supine to sit: +2 for physical assistance, Min assist Sit to supine: Min assist, +2 for physical assistance General bed mobility comments: use of bedrail to pull self to L side for cleanup. assisted to EOB from this position w/ assist needed to get BLE off of bed and MinA to lift trunk    Transfers  Overall transfer level: Needs  assistance Equipment used: Ambulation equipment used Transfers: Sit to/from Stand, Bed to chair/wheelchair/BSC Sit to Stand: Mod assist, +2 safety/equipment, +2 physical assistance Bed to/from chair/wheelchair/BSC transfer type:: Via Lift equipment Transfer via Lift Equipment: Stedy General transfer comment: Mod A x2 to stand in H. Rivera Colen (increased assist on R weaker side) and transferred to chair with Stedy. Pt able to stand from lower recliner as well with same assist level.    Ambulation / Gait / Stairs / Psychologist, prison and probation services  Posture / Balance Dynamic Sitting Balance Sitting balance - Comments: Supervision -min guard; increased left lateral lean following transfer Balance Overall balance assessment: Needs assistance Sitting-balance support: Feet supported Sitting balance-Leahy Scale: Fair Sitting balance - Comments: Supervision -min guard; increased left lateral lean following transfer Standing balance support: Single extremity supported, During functional activity Standing balance-Leahy Scale: Poor    Special needs/care consideration Diabetic management yes     Previous Home Environment (from acute therapy documentation) Living Arrangements: Alone Home Care Services: No Additional Comments: pt unable to provide due to aphasia. pt noted w/ wedding band on- when asked with yes/no questions - pt reports being married for 30 years though no spouse listed in chart  Discharge Living Setting Plans for Discharge Living Setting: Patient's home, Lives with (comment) (spouse) Type of Home at Discharge: Apartment Discharge Home Layout: One level Discharge Home Access: Level entry, Elevator Discharge Bathroom Shower/Tub: Tub/shower unit Discharge Bathroom Toilet: Standard Discharge Bathroom Accessibility: Yes How Accessible: Accessible via walker Does the patient have any problems obtaining your medications?: No  Social/Family/Support Systems Patient Roles: Spouse Anticipated  Caregiver: contact is pt's brother, [name] Anticipated Industrial/product designer Information: [phone] Ability/Limitations of Caregiver: family cannot provide 24/7, will rely on assist from [place] to assist with filling in the caregiver gaps, per Helmut Muster [phone] ext [phone] rehab CSW to call when d/c date set so they can set it up. Caregiver Availability: 24/7 Discharge Plan Discussed with Primary Caregiver: Yes Is Caregiver In Agreement with Plan?: Yes Does Caregiver/Family have Issues with Lodging/Transportation while Pt is in Rehab?: No   Goals Patient/Family Goal for Rehab: PT/OT supervision to min assist, SLP supervision Expected length of stay: 14-18 days Additional Information: Discharge plan: return home with spouse, family and VA caregivers Pt/Family Agrees to Admission and willing to participate: Yes Program Orientation Provided & Reviewed with Pt/Caregiver Including Roles  & Responsibilities: Yes Additional Information Needs: CSW to notify Helmut Muster [phone] ext [phone] of CIR d/c date to help set up caregivers  Barriers to Discharge: Decreased caregiver support   Decrease burden of Care through IP rehab admission: n/a   Possible need for SNF placement upon discharge:Not anticipated.  Plan for d/c home with spouse and 24/7 between family and VA support.    Patient Condition: This patient's condition remains as documented in the consult dated ***, in which the Rehabilitation Physician determined and documented that the patient's condition is appropriate for intensive rehabilitative care in an inpatient rehabilitation facility. Will admit to inpatient rehab today.  Preadmission Screen Completed By:  Stephania Fragmin, PT, 10/25/2022 3:50 PM ______________________________________________________________________   Discussed status with Dr. Marland Kitchenon***at *** and received approval for admission today.  Admission Coordinator:  Stephania Fragmin, time***/Date***

## 2022-10-25 NOTE — Plan of Care (Signed)
  Problem: Activity: Goal: Ability to return to baseline activity level will improve Outcome: Progressing   Problem: Activity: Goal: Risk for activity intolerance will decrease Outcome: Progressing   Problem: Nutrition: Goal: Adequate nutrition will be maintained Outcome: Progressing   Problem: Coping: Goal: Level of anxiety will decrease Outcome: Progressing   Problem: Safety: Goal: Ability to remain free from injury will improve Outcome: Progressing   Problem: Skin Integrity: Goal: Risk for impaired skin integrity will decrease Outcome: Progressing

## 2022-10-25 NOTE — Progress Notes (Signed)
SLP Cancellation Note  Patient Details Name: Jamare Trivino MRN: [id number] DOB: [birth year]   Cancelled treatment:       Reason Eval/Treat Not Completed: Patient at procedure or test/unavailable (EEG). Touched base with RN who voices no concerns about swallowing with current diet. Will continue to f/u as able.    Mahala Menghini., M.A. CCC-SLP Acute Rehabilitation Services Office [phone]  Secure chat preferred  10/25/2022, 4:52 PM

## 2022-10-25 NOTE — TOC Initial Note (Signed)
Transition of Care [facility]) - Initial/Assessment Note    Patient Details  Name: Frank Barron MRN: [id number] Date of Birth: [birth year]  [facility]) CM/SW Contact:    Kermit Balo, RN Phone Number: 10/25/2022, 2:57 PM  Clinical Narrative:                 Pt is from home alone. Current recommendations are for CIR. Pt will need support after a CIR stay and family is working on it and need to see if pt will qualify for aides through the [place]. CM called his SW through the [place]: [place] and she states this can not be arranged until there is a d/c date from rehab. CM has updated CIR rep and provided the SW contact information. TOC following.  Expected Discharge Plan: IP Rehab Facility Barriers to Discharge: Continued Medical Work up   Patient Goals and CMS Choice   CMS Medicare.gov Compare Post Acute Care list provided to:: Patient Choice offered to / list presented to : Patient      Expected Discharge Plan and Services   Discharge Planning Services: CM Consult   Living arrangements for the past 2 months: Apartment                                      Prior Living Arrangements/Services Living arrangements for the past 2 months: Apartment Lives with:: Self Patient language and need for interpreter reviewed:: Yes Do you feel safe going back to the place where you live?: Yes        Care giver support system in place?: No (comment)   Criminal Activity/Legal Involvement Pertinent to Current Situation/Hospitalization: No - Comment as needed  Activities of Daily Living      Permission Sought/Granted                  Emotional Assessment           Psych Involvement: No (comment)  Admission diagnosis:  Acute ischemic stroke [facility]) [I63.9] Middle cerebral artery embolism, left [I66.02] Patient Active Problem List   Diagnosis Date Noted   Acute ischemic stroke (HCC) 10/22/2022   Middle cerebral artery embolism, left 10/22/2022   Malignant  neoplasm of prostate (HCC) 09/08/2016   Severe sepsis (HCC) 01/04/2016   Thrombocytopenia (HCC) 01/04/2016   A-fib (HCC) 01/02/2016   Adrenal mass, right (HCC) 01/02/2016   Solitary pulmonary nodule 01/02/2016   H/O unilateral nephrectomy 01/02/2016   AKI (acute kidney injury) (HCC) 01/02/2016   Pyelonephritis 01/01/2016   S/P total knee replacement using cement 09/12/2014   Urinary retention    Cocaine abuse (HCC)    Acute encephalopathy    Altered mental status 02/02/2014   Acute renal failure (HCC) 02/02/2014   Hypertension 02/02/2014   DVT (deep venous thrombosis) (HCC) 02/02/2014   CAD (coronary artery disease)    Anemia 09/29/2012   Melena 09/29/2012   Aphasia as late effect of cerebrovascular accident 07/03/2012   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   PCP:  Cl[facility], [facility] Pharmacy:   [id number] - [place], [place] - [address] AT [facility] [address] [place] [address] Phone: [phone] Fax: [phone]     Social Determinants of Health (SDOH) Social History: SDOH Screenings   Food Insecurity: No Food Insecurity (04/15/2020)   Received from [facility]  Social Connections: Unknown (07/28/2021)  [facility] from [facility]  Tobacco Use: Low Risk  (10/20/2022)   SDOH Interventions:     Readmission Risk Interventions     No data to display

## 2022-10-25 NOTE — Evaluation (Signed)
Occupational Therapy Evaluation Patient Details Name: Frank Barron MRN: [id number] DOB: Jun 23, 1950 Today's Date: 10/25/2022   History of Present Illness Pt is a 72 yo male presenting 8/9 with acute R sided weakness. CTH concerning for possible new age indeterminate infarcts in the L basal ganglia. Pt not a candidate for TNK but was taken to IR for revascularization. MRI 8/10 confirmed evidence of ongoing abnormal flow in the L MCA, L MCA acute infarcts most confluent in the L basal ganglia, and acute infarct in the contralateral R caudate. 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 Pt consistently following directions and responding to yes/no questions though no family present to confirm answers. Pt requires Mod A for bed mobility and Mod A x 2 for transfers in the [place] from various surface heights. Pt requires Setup/Supervision for breakfast using L dominant UE, Max A for other UB/LB ADLs. Based on rehab potential, recommend consideration of intensive rehab services at [facility].      If plan is discharge home, recommend the following: A lot of help with walking and/or transfers;Two people to help with walking and/or transfers;A lot of help with bathing/dressing/bathroom    Functional Status Assessment  Patient has had a recent decline in their functional status and demonstrates the ability to make significant improvements in function in a reasonable and predictable amount of time.  Equipment Recommendations  Other (comment) (TBD pending progress)    Recommendations for Other Services Rehab consult     Precautions / Restrictions Precautions Precautions: Fall;Other (comment) Precaution Comments: R hemiplegia,  aphasia Restrictions Weight Bearing Restrictions: No      Mobility Bed Mobility Overal bed mobility: Needs Assistance Bed Mobility: Rolling, Sidelying to Sit Rolling: Min assist Sidelying to sit: Mod assist, +2 for safety/equipment       General bed mobility comments: use of bedrail to pull self to L side for cleanup. assisted to EOB from this position w/ assist needed to get BLE off of bed and MinA to lift trunk    Transfers Overall transfer level: Needs assistance Equipment used: Ambulation equipment used Transfers: Sit to/from Stand, Bed to chair/wheelchair/BSC Sit to Stand: Mod assist, +2 safety/equipment, +2 physical assistance           General transfer comment: Mod A x2 to stand in [place] (increased assist on R weaker side) and transferred to chair with [place]. Pt able to stand from lower recliner as well with same assist level. Transfer via Lift Equipment: Stedy    Balance Overall balance assessment: Needs assistance Sitting-balance support: Feet supported Sitting balance-Leahy Scale: Fair     Standing balance support: Single extremity supported, During functional activity Standing balance-Leahy Scale: Poor                             ADL either performed or assessed with clinical judgement   ADL Overall ADL's : Needs assistance/impaired Eating/Feeding: Set up;Sitting;Supervision/ safety Eating/Feeding Details (indicate cue type and reason): assist to open containers, able to answer yes/no to if pt would like salt/pepper, etc on certain items. minor cues to take smaller bites but pt able to self feed with LUE well Grooming: Minimal assistance;Sitting   Upper Body Bathing: Maximal assistance;Sitting   Lower Body Bathing: Maximal  assistance;+2 for physical assistance;+2 for safety/equipment;Sit to/from stand   Upper Body Dressing : Maximal assistance   Lower Body Dressing: Maximal assistance;+2 for physical assistance;+2 for safety/equipment;Sit  to/from stand       Toileting- Architect and Hygiene: Total assistance;Bed level Toileting - Clothing Manipulation Details (indicate cue type and reason): for clean up due to primofit malfunction       General ADL Comments: Pt with R nondominant UE and RLE weakness, questionable vision impairments requiring significant amount of assist to complete ADLs     Vision Baseline Vision/History: 1 Wears glasses Ability to See in Adequate Light: 2 Moderately impaired Patient Visual Report: Other (comment) (pt unable to report) Vision Assessment?: Vision impaired- to be further tested in functional context Additional Comments: able to turn head to R to look at therapist. some questionable R inattention - cued to look to R out window, inquired yes/no questions on what pt could see but inconsistent responses there     Perception         Praxis         Pertinent Vitals/Pain Pain Assessment Pain Assessment: No/denies pain     Extremity/Trunk Assessment Upper Extremity Assessment Upper Extremity Assessment: RUE deficits/detail;Left hand dominant RUE Deficits / Details: unable to move UE on command, some involuntary/automatic movements, when yawning, pt R UE automatically bending and lifting towards face. nods head yes when asked if he could feel RUE RUE Coordination: decreased fine motor;decreased gross motor   Lower Extremity Assessment Lower Extremity Assessment: Defer to PT evaluation   Cervical / Trunk Assessment Cervical / Trunk Assessment: Normal   Communication Communication Communication: Difficulty communicating thoughts/reduced clarity of speech Following commands: Follows one step commands consistently   Cognition Arousal: Alert Behavior During Therapy: Flat affect Overall Cognitive Status: Difficult to assess Area of Impairment: Awareness, Problem solving                           Awareness: Emergent Problem Solving: Requires verbal cues,  Requires tactile cues General Comments: Pt with expressive aphasia, fairly accurate appearing yes/no questions though family not present to confirm PLOF. consistent command following. shows some insight into deficits (shaking head when asked if he could move RUE, indicating being wet in bed)     General Comments       Exercises Exercises: Other exercises Other Exercises Other Exercises: mini squats in stedy x 10 Other Exercises: attempts to march in place in [place] (unable to lift RLE well)   Shoulder Instructions      Home Living Family/patient expects to be discharged to:: Private residence                                 Additional Comments: pt unable to provide due to aphasia. pt noted w/ wedding band on- when asked with yes/no questions - pt reports being married for 30 years though no spouse listed in chart      Prior Functioning/Environment Prior Level of Function : Patient poor historian/Family not available                        OT Problem List: Decreased strength;Decreased activity tolerance;Impaired balance (sitting and/or standing);Decreased coordination;Decreased cognition;Decreased knowledge of use of DME or AE;Impaired vision/perception;Decreased safety awareness;Impaired UE functional use;Impaired tone      OT Treatment/Interventions: Self-care/ADL training;Therapeutic exercise;Energy conservation;DME and/or AE instruction;Therapeutic activities;Manual therapy;Patient/family  education    OT Goals(Current goals can be found in the care plan section) Acute Rehab OT Goals Patient Stated Goal: eat breakfast OT Goal Formulation: With patient Time For Goal Achievement: 11/08/22 Potential to Achieve Goals: Good ADL Goals Pt Will Perform Lower Body Bathing: with min assist;sit to/from stand;sitting/lateral leans Pt Will Perform Lower Body Dressing: with min assist;sit to/from stand;sitting/lateral leans Pt Will Transfer to Toilet: with min  assist;ambulating Pt/caregiver will Perform Home Exercise Program: Increased strength;Right Upper extremity;With Supervision;With written HEP provided  OT Frequency: Min 1X/week    Co-evaluation              AM-PAC OT "6 Clicks" Daily Activity     Outcome Measure Help from another person eating meals?: A Little Help from another person taking care of personal grooming?: A Little Help from another person toileting, which includes using toliet, bedpan, or urinal?: Total Help from another person bathing (including washing, rinsing, drying)?: A Lot Help from another person to put on and taking off regular upper body clothing?: A Lot Help from another person to put on and taking off regular lower body clothing?: A Lot 6 Click Score: 13   End of Session Equipment Utilized During Treatment: Gait belt Nurse Communication: Mobility status;Other (comment) (primofit malfunction)  Activity Tolerance: Patient tolerated treatment well Patient left: in chair;with call bell/phone within reach;with chair alarm set  OT Visit Diagnosis: Unsteadiness on feet (R26.81);Other abnormalities of gait and mobility (R26.89);Muscle weakness (generalized) (M62.81);Hemiplegia and hemiparesis Hemiplegia - Right/Left: Right Hemiplegia - dominant/non-dominant: Non-Dominant Hemiplegia - caused by: Cerebral infarction                Time: 1610-9604 OT Time Calculation (min): 26 min Charges:  OT General Charges $OT Visit: 1 Visit OT Evaluation $OT Eval Moderate Complexity: 1 Mod OT Treatments $Self Care/Home Management : 8-22 mins  Bradd Canary, OTR/L Acute Rehab Services Office: [phone]   Lorre Munroe 10/25/2022, 11:27 AM

## 2022-10-25 NOTE — Procedures (Signed)
Patient Name: Frank Barron  MRN: [id number]  Epilepsy Attending: Charlsie Quest  Referring Physician/Provider: Mathews Argyle, NP  Date: 10/25/2022 Duration: 25.25 mins  Patient history: 72yo M with Left MCA stroke getting eeg to evaluate for seizure.  Level of alertness: Awake, drowsy  AEDs during EEG study: None  Technical aspects: This EEG study was done with scalp electrodes positioned according to the 10-20 International system of electrode placement. Electrical activity was reviewed with band pass filter of 1-70Hz , sensitivity of 7 uV/mm, display speed of 5mm/sec with a 60Hz  notched filter applied as appropriate. EEG data were recorded continuously and digitally stored.  Video monitoring was available and reviewed as appropriate.  Description:  The posterior dominant rhythm consists of 8 Hz activity of moderate voltage (25-35 uV) seen predominantly in posterior head regions, asymmetric ( left<right) and reactive to eye opening and eye closing.  Drowsiness was characterized by attenuation of posterior dominant rhythm. EEG showed continuous 3 to 6 Hz theta-delta slowing in left hemisphere as well as intermittent generalized 3-5Hz  theta-delta slowing. Hyperventilation and photic stimulation were not performed.      ABNORMALITY - Continuous slow, left  hemisphere  - Intermittent slow, generalized - Background asymmetry, left<right   IMPRESSION: This study is suggestive of cortical dysfunction arising from left hemisphere likely secondary to underlying stroke. Additionally there is mild to moderate diffuse encephalopathy. No seizures or epileptiform discharges were seen throughout the recording.    Annabelle Harman

## 2022-10-25 NOTE — Progress Notes (Addendum)
STROKE TEAM PROGRESS NOTE   BRIEF HPI [name] is a 72 y.o. 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  ambulates with a walker at baseline.  S/p mechanical thrombectomy of left M1 with TICI 2c  SIGNIFICANT HOSPITAL EVENTS 8/9 mechanical thrombectomy of left M1 with TICI 2c  INTERIM HISTORY/SUBJECTIVE Patient is awake and alert in no apparent distress. Sitting in the chair. No family at the bedside.  Concern for neuro change this afternoon @ 1400 with patient more lethargic and nonverbal. Patient has been globally aphasic since stroke.  Stat CT head Evolving left MCA territory infarct. Focal 1.2 x 0.8 cm hyperdense region in the region of the left Sylvian fissure could represent contrast pooling or a site of parenchymal hemorrhage.  Will give a 500 cc bolus  Neurological exam with right facial, right hemiparesis, globally aphasic, no blink to threat on right can mimic and intermittently follow simple one step commands  OBJECTIVE  CBC    Component Value Date/Time   WBC 5.9 10/23/2022 0618   RBC 4.32 10/23/2022 0618   HGB 13.1 10/23/2022 0618   HCT 37.9 (L) 10/23/2022 0618   PLT 127 (L) 10/23/2022 0618   MCV 87.7 10/23/2022 0618   MCH 30.3 10/23/2022 0618   MCHC 34.6 10/23/2022 0618   RDW 14.6 10/23/2022 0618   LYMPHSABS 0.8 10/23/2022 0618   MONOABS 0.6 10/23/2022 0618   EOSABS 0.0 10/23/2022 0618   BASOSABS 0.0 10/23/2022 0618    BMET    Component Value Date/Time   NA 135 10/23/2022 0618   K 4.7 10/23/2022 0618   CL 102 10/23/2022 0618   CO2 20 (L)  10/23/2022 0618   GLUCOSE 105 (H) 10/23/2022 0618   BUN 15 10/23/2022 0618   CREATININE 1.36 (H) 10/23/2022 0618   CALCIUM 8.6 (L) 10/23/2022 0618   GFRNONAA 56 (L) 10/23/2022 0618    IMAGING past 24 hours CT HEAD WO CONTRAST ( )  Addendum Date: 10/25/2022   ADDENDUM REPORT: 10/25/2022 13:59 ADDENDUM: Alternatively, a CTA head could also be considered for more definitive characterization. Electronically Signed   By: Frank Barron M.D.   On: 10/25/2022 13:59   Result Date: 10/25/2022 CLINICAL DATA:  Neuro deficit, acute, stroke suspected EXAM: CT HEAD WITHOUT CONTRAST TECHNIQUE: Contiguous axial images were obtained from the base of the skull through the vertex without intravenous contrast. RADIATION DOSE REDUCTION: This exam was performed according to the departmental dose-optimization program which includes automated exposure control, adjustment of the mA and/or kV according to patient size and/or use of iterative reconstruction technique. COMPARISON:  Brain MRI 10/23/2022 FINDINGS: Brain: Evolving left MCA territory infarct. There is a focal 1.2 x 0.8 cm hyperdenseregion in the region of the left sylvian fissure, which is along the course of the left MCA branches, and could represent contrast pooling or a site of parenchymal hemorrhage. No mass effect. No hydrocephalus. No extra-axial fluid collection. Vascular: Diffusely hyperdense appearance of the left MCA involving the M1 through the M3 segments. Skull: Normal. Negative for fracture or focal lesion. Sinuses/Orbits: No middle ear or mastoid effusion. Paranasal sinuses are clear. Right lens replacement with  a scleral band. Other: None. IMPRESSION: 1. Evolving left MCA territory infarct. Focal 1.2 x 0.8 cm hyperdense region in the region of the left Sylvian fissure could represent contrast pooling or a site of parenchymal hemorrhage. Recommend short term follow-up CT to ensure stability. 2. Diffusely hyperdense appearance of the left MCA involving  the M1 through the M3 segments, worrisome for thrombus. These results will be called to the ordering clinician or representative by the Radiologist Assistant, and communication documented in the PACS or [employer]. Electronically Signed: By: Frank Barron M.D. On: 10/25/2022 13:56    Vitals:   10/24/22 2353 10/25/22 0337 10/25/22 0903 10/25/22 1240  BP: (!) 139/98 106/87 (!) 122/98 111/85  Pulse: (!) 57 62 63 71  Resp:   15 20  Temp: 98.3 F (36.8 C) 98 F (36.7 C) 97.8 F (36.6 C) 97.9 F (36.6 C)  TempSrc: Oral Oral Axillary Axillary  SpO2: 100% 100% 100% 100%  Weight:      Height:         PHYSICAL EXAM General:  Alert, well-nourished, well-developed middle-aged African-American male in no acute distress Psych:  Mood and affect appropriate for situation CV: Regular rate and rhythm on monitor Respiratory:  Regular, unlabored respirations on room air GI: Abdomen soft and nontender   NEURO:  Mental Status: AA&Ox3, severe expressive aphasia can follow simple midline commands only.  Can mimic can say one word answers "yes" or "no" Speech/Language: speech is with dysarthria    Cranial Nerves:  II: PERRL.  Decreased blink to threat on the right III, IV, VI: EOMI. Eyelids elevate symmetrically.  V: Sensation is intact to light touch and symmetrical to face.  VII: Right facial droop VIII: hearing intact to voice. IX, X: Palate elevates symmetrically. Phonation is normal.  ZO:[id number] shrug 5/5. XII: tongue is midline without fasciculations. Motor: Right hemiparesis right arm and right leg 2/5 with increased tone and decreased right grip Tone: is normal and bulk is normal Sensation- Intact to light touch bilaterally. Extinction absent to light touch to DSS.   Coordination: FTN intact bilaterally, HKS: no ataxia in BLE.No drift.  Gait- deferred   ASSESSMENT/PLAN  Acute Ischemic Infarct:  left MCA s/p mechanical thrombectomy of left M1 with TICI 2c Etiology:  Cardioembolic in the setting of A-fib on Xarelto-unsure about compliance Code Stroke  CT head Possible new age indeterminate infarcts in the left basal ganglia. ASPECTS 9   CTA head & neck Acute left M1 occlusion with some reconstitution of flow in distal branches. CT perfusion  65 cc ischemic penumbra in the MCA distribution. No infarct core identified. Post IR CT mild subarachnoid hemorrhage in the distal left anterior parietal cortical region. MRI  Left MCA territory acute infarcts most confluent in the left basal ganglia. 2D Echo Ef 65-70%.  LDL 92 HgbA1c 5.2 VTE prophylaxis -SCDs Xarelto (rivaroxaban) daily prior to admission, now on Xarelto (rivaroxaban) daily  Therapy recommendations: CIR Disposition: Pending  Hx of Stroke/TIA With residual aphasia  stroke in 2013  Atrial fibrillation Home Meds: Xarelto- unsure about compliance  Continue telemetry monitoring Continue Xarelto  Hypertension Hypertensive emergency Home meds: Amlodipine 5 mg, hydralazine 25 mg twice daily Cleviprex gtt off Added home amlodipine 10 mg Blood Pressure Goal: SBP 120-160 for first 24 hours then less than 180   Hyperlipidemia Home meds: Crestor 30 mg, increased to 40 LDL 92, goal < 70 Increase to 40 mg Continue statin at discharge  Diabetes type II Controlled Home meds: None HgbA1c 5.2,  goal < 7.0 CBGs   Substance Abuse Patient uses cocaine UDS positive for Cocaine      Ready to quit? N/A TOC consult for cessation placed  Dysphagia Patient has post-stroke dysphagia, SLP consulted    Diet   DIET DYS 2 Room service appropriate? No; Fluid consistency: Thin   Advance diet as tolerated  History of DVT Noted  Other Stroke Risk Factors  ETOH use, alcohol level <10, advised to drink no more than 2 drink(s) a day Coronary artery disease Congestive heart failure   Other Active Problems History of renal and prostate cancer-resume home [facility] day # 3  Gevena Mart  DNP, ACNPC-AG  [facility]  I have personally obtained history,examined this patient, reviewed notes, independently viewed imaging studies, participated in medical decision making and plan of care.ROS completed by me personally and pertinent positives fully documented  I have made any additions or clarifications directly to the above note. Agree with note above.  Patient appeared to be less responsive to family and they were concerned this was morning after a.m. rounds and his stat CT scan of the head was obtained which showed expectedly visionary changes in the left MCA infarct with 1.2 x 0.8 cm left sylvian fissure hyperdensity hematoma versus contrast pooling.  Patient was given IV normal saline 500 cc bolus followed by IV fluids and showed some improvement in mental status and was laughing and interacting with family after risks.  Continue Xarelto and ongoing therapies.  Weightbearing inpatient rehab after insurance approval.  Greater than 50% time during this 50-minute visit were spent in counseling and coordination of care about his aphasia and hemiparesis and A-fib and stroke patient with patient and family and answering questions. Delia Heady, MD Medical Director [facility] Pager: [phone] 10/25/2022 3:55 PM   To contact Stroke Continuity provider, please refer to [email or web address]. After hours, contact General Neurology

## 2022-10-25 NOTE — Progress Notes (Addendum)
Inpatient Rehab Coordinator Note:  I spoke with patient's daughter over the phone to discuss CIR recommendations and goals/expectations of CIR stay.  We reviewed 3 hrs/day of therapy, physician follow up, and average length of stay 2 weeks (dependent upon progress) with goals of supervision to min assist.  Daughter lives in [place] and pt has brother/sister in the area.  I left a message for brother to discuss as well.  [name] and I talked about VA benefits vs [employer] and the [place] potentially able to provide some assist at home as well.  Will wait to hear back from [name] to determine whether to pursue CIR vs SNF.  Pt uses [facility].    Addendum: spoke to pt's brother [name].  Pt does have family in the area but they wouldn't likely be able ot provide 24/7 assist.  They would need some assist from the [place] to cover the gaps.  I will work on prior auth and let TOC know to f/u for assist with VA care at home.   Estill Dooms, PT, DPT Admissions Coordinator [phone] 10/25/22  11:15 AM

## 2022-10-25 NOTE — Progress Notes (Signed)
Referring Physician(s): Delia Heady, MD  Supervising Physician: Julieanne Cotton  Patient Status:  [facility] - In-pt  Chief Complaint: Follow up left MCA occlusion  Subjective:  Patient laying in bed watching TV, when asked how he is feeling he states "good" but has difficulty with word finding with other questions. When asked to hold up 2 fingers if he feels his dysarthria is improving or 1 finger if it is staying the same he holds up 1 finger consistently. He follows commands and seems to understand everything said to him. Shakes his head yes when asked if he is going to rehab.  Allergies: Patient has no known allergies.  Medications: Prior to Admission medications   Medication Sig Start Date End Date Taking? Authorizing Provider  acetaminophen (TYLENOL) 650 MG CR tablet Take 1,300 mg by mouth daily as needed for pain.   Yes [provider]  amLODipine (NORVASC) 5 MG tablet Take 5 mg by mouth daily.   Yes [provider]  aspirin 81 MG chewable tablet Chew 81 mg by mouth daily.   Yes [provider]  diclofenac Sodium (VOLTAREN ARTHRITIS PAIN) 1 % GEL Apply 2 g topically as needed.   Yes [provider]  hydrALAZINE (APRESOLINE) 25 MG tablet Take 1 tablet (25 mg total) by mouth 2 (two) times daily. Patient taking differently: Take 25 mg by mouth daily. 02/07/14  Yes Elgergawy, Leana Roe, MD  lidocaine (LIDODERM) 5 % Place 1 patch onto the skin daily.   Yes [provider]  loratadine (CLARITIN) 10 MG tablet Take 10 mg by mouth daily.   Yes [provider]  Multiple Vitamin (MULTIVITAMIN ADULT PO) Take 1 tablet by mouth daily.   Yes [provider]  naltrexone (DEPADE) 50 MG tablet Take 50 mg by mouth daily.   Yes [provider]  Rivaroxaban (XARELTO) 15 MG TABS tablet Take 15 mg by mouth daily.   Yes [provider]  rosuvastatin (CRESTOR) 20 MG tablet Take 1.5 tablets by mouth daily.   Yes [provider]  sildenafil (VIAGRA) 100 MG tablet Take 1.5 tablets by mouth as needed for erectile dysfunction.   Yes [provider]  thiamine (VITAMIN B-1) 100 MG tablet Take 100 mg by mouth daily.   Yes [provider]  traZODone (DESYREL) 50 MG tablet Take 50 mg by mouth at bedtime as needed for sleep.   Yes [provider]     Vital Signs: BP (!) 122/98 (BP Location: Right Arm)   Pulse 63   Temp 97.8 F (36.6 C) (Axillary)   Resp 15   Ht 6\' 3"  (1.905 m)   Wt 223 lb 15.8 oz (101.6 kg)   SpO2 100%   BMI 28.00 kg/m   Physical Exam Vitals and nursing note reviewed.  Constitutional:      General: He is not in acute distress. HENT:     Head: Normocephalic.  Cardiovascular:     Rate and Rhythm: Normal rate.  Pulmonary:     Effort: Pulmonary effort is normal.  Skin:    General: Skin is warm and dry.  Neurological:     Mental Status: He is alert.   Alert, awake - unable to assess orientation due to dysarthria Speech is dysarthric, patient follows all commands and answers questions appropriately using fingers/shaking head Visual fields grossly in tact No facial asymmetry. Motor power 5/5 left upper and lower extremity, very minimal movement right upper and lower extremities  Imaging: IR PERCUTANEOUS  ART THROMBECTOMY/INFUSION INTRACRANIAL INC DIAG ANGIO  Result Date: 10/25/2022 INDICATION: New onset worsening aphasia and right-sided hemiplegia. 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 EXAM: 1. EMERGENT LARGE VESSEL OCCLUSION THROMBOLYSIS of the head and neck. COMPARISON:  CT angiogram of the head and neck October 22, 2022. MEDICATIONS: Ancef 2 g IV antibiotic was administered within 1 hour of the procedure. ANESTHESIA/SEDATION: General anesthesia. CONTRAST:  Omnipaque 300 approximately 100 cc. FLUOROSCOPY TIME:  Fluoroscopy Time: 65 minutes  (5707 mGy). COMPLICATIONS: None immediate. TECHNIQUE: Following a full explanation of the  procedure along with the potential associated complications, an informed witnessed consent was obtained. The risks of intracranial hemorrhage of 10%, worsening neurological deficit, ventilator dependency, death and inability to revascularize were all reviewed in detail with the patient's daughter. The patient was then put under general anesthesia by the Department of Anesthesiology at [facility]. The right groin was prepped and draped in the usual sterile fashion. Thereafter using modified Seldinger technique, transfemoral access into the right common femoral artery was obtained without difficulty. Over an 0.035 inch guidewire an 8 French 25 cm Pinnacle sheath was inserted. Through this, and also over an 0.035 inch guidewire a combination of Simmons 2 125 cm support catheter inside of an 087 95 cm balloon guide catheter was advanced to the aortic arch, and positioned initially in the left common carotid artery and then the left internal carotid artery distally. FINDINGS: The left common carotid arteriogram demonstrates the left external carotid artery and its major branches to be widely patent. The left internal carotid artery at the bulb to the cranial skull base demonstrates patency with moderate tortuosity in its mid cervical segment. The petrous, the cavernous and the supraclinoid left ICA are widely patent. The left anterior cerebral artery opacifies into the capillary and venous phases. Prompt filling via the anterior communicating artery of the right anterior cerebral A2 segment and distally is noted. The left middle cerebral artery demonstrates complete occlusion just distal to the origin of the left anterior temporal branch. Numerous fine arterial collaterals are seen emanating from the proximal aspect of the left middle cerebral artery and the left A1 segment. PROCEDURE: Through the balloon guide catheter in the distal left ICA, a combination of an 055 Zoom aspiration catheter with an 021 over an  018 inch micro guidewire with moderate J configuration to the left MCA just proximal to the occlusion. Using a torque device access was obtained with the micro guidewire into a branch of the inferior division of the left MCA followed by the microcatheter. The guidewire was removed. A gentle arteriogram performed through the microcatheter demonstrated significantly attenuated caliber of the inferior division extending all the way to its proximal aspect. The Zoom aspiration catheter was then advanced and engaged into the occluded left MCA. The micro guidewire and the microcatheter were removed. Constant aspiration was then applied at the hub of the Zoom aspiration catheter for 2 minutes using a Penumbra aspiration pump. The aspiration catheter was then removed with proximal flow arrest. Arteriogram performed through the balloon guide catheter in the left internal carotid artery following reversal of flow arrest now demonstrated improved flow into the distal left middle cerebral artery M1 segment extending with the opacification of significantly attenuated caliber of the superior and inferior divisions. A second pass was then made using an 044 Zoom aspiration catheter with an microcatheter over again an 018 inch Aristotle micro guidewire. The micro guidewire was then gently advanced through the inferior division of  the left middle cerebral artery with moderate resistance followed by the microcatheter into the proximal M3 region. The guidewire was removed. Slow aspiration of blood was noted at the hub of the microcatheter and was retrieved more proximally. The 044 aspiration catheter was now advanced with significant resistance into the region of the origin of the inferior division. Aspiration was started following removal of the microcatheter and the micro guidewire. A control arteriogram performed after a minute and a half aspiration again demonstrate minimally improved caliber and flow through the superior and the  inferior divisions. The delayed arterial phase suggest presence of a dilated inferior division distal to its origin. At this time, a Socrates 038 aspiration was advanced over an 028 inch micro guidewire to the occluded inferior division. A 016 double angled micro guidewire was then gently to gain access through the severely stenotic inferior division origin. The Socrates guide catheter was advanced into the more dilated segment of the inferior division in the M3 segment. The guidewire was removed. A gentle control arteriogram performed through the Socrates catheter distally demonstrated slow flow into the distal distribution with stagnation of contrast more proximally. Aspiration was initiated through the aspiration catheter for 2 minutes. This was then removed. A control arteriogram performed through the balloon guide catheter in the left internal carotid artery demonstrated only marginal opacification of the inferior division proximally. 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 The delayed arterial phase demonstrated retrograde opacification of the left MCA distribution from the branches of the callosal marginal and pericallosal branches of the left anterior cerebral artery and also from the P3 segment of the left posterior cerebral artery confirmed on a selective left vertebral artery arteriogram. Stent placement at the origin of the left MCA inferior division was not attempted due to the presence of prominent collaterals as described above supplying the territory of the left middle cerebral artery. Also there was increased risk of intracranial hemorrhage due to the use of dual antiplatelets to protect the stent from thrombosis, due to the fact the patient was on Xarelto. An 8 French Angio-Seal closure device was then deployed for hemostasis at the right groin puncture site. Distal pulses remained  Dopplerable. Dorsalis pedis unchanged bilaterally. Flat panel CT of the brain demonstrated mild hyperattenuation in the left frontoparietal region suggestive of subarachnoid hemorrhage with contrast stain. The patient's general anesthesia was reversed and the patient was extubated. Upon recovery, the patient continued to have difficulty with expression though was able to follow simple commands appropriately. Patient demonstrated no significant movement in his right arm and right leg. He was then transferred to the neuro ICU for post revascularization care. IMPRESSION: Status post partial revascularization of occluded left middle cerebral artery distal M1 segment and the inferior division with 3 passes of contact aspiration using a 055 Zoom aspiration catheter, an 044 aspiration catheter, and an 038 Socrates aspiration catheter achieving a TICI 2B revascularization. PLAN: As per referring MD. Electronically Signed   By: Julieanne Cotton M.D.   On: 10/25/2022 08:03   IR CT Head Ltd  Result Date: 10/25/2022 INDICATION: New onset worsening aphasia and right-sided hemiplegia. 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 EXAM: 1. EMERGENT LARGE VESSEL OCCLUSION THROMBOLYSIS of the head and neck. COMPARISON:  CT angiogram of the head and neck October 22, 2022. MEDICATIONS: Ancef 2 g IV antibiotic was administered within 1 hour of the procedure. ANESTHESIA/SEDATION: General anesthesia. CONTRAST:  Omnipaque 300 approximately  100 cc. FLUOROSCOPY TIME:  Fluoroscopy Time: 65 minutes  (5707 mGy). COMPLICATIONS: None immediate. TECHNIQUE: Following a full explanation of the procedure along with the potential associated complications, an informed witnessed consent was obtained. The risks of intracranial hemorrhage of 10%, worsening neurological deficit, ventilator dependency, death and inability to revascularize were all reviewed in detail with the patient's daughter. The patient was then put under general  anesthesia by the Department of Anesthesiology at [facility]. The right groin was prepped and draped in the usual sterile fashion. Thereafter using modified Seldinger technique, transfemoral access into the right common femoral artery was obtained without difficulty. Over an 0.035 inch guidewire an 8 French 25 cm Pinnacle sheath was inserted. Through this, and also over an 0.035 inch guidewire a combination of Simmons 2 125 cm support catheter inside of an 087 95 cm balloon guide catheter was advanced to the aortic arch, and positioned initially in the left common carotid artery and then the left internal carotid artery distally. FINDINGS: The left common carotid arteriogram demonstrates the left external carotid artery and its major branches to be widely patent. The left internal carotid artery at the bulb to the cranial skull base demonstrates patency with moderate tortuosity in its mid cervical segment. The petrous, the cavernous and the supraclinoid left ICA are widely patent. The left anterior cerebral artery opacifies into the capillary and venous phases. Prompt filling via the anterior communicating artery of the right anterior cerebral A2 segment and distally is noted. The left middle cerebral artery demonstrates complete occlusion just distal to the origin of the left anterior temporal branch. Numerous fine arterial collaterals are seen emanating from the proximal aspect of the left middle cerebral artery and the left A1 segment. PROCEDURE: Through the balloon guide catheter in the distal left ICA, a combination of an 055 Zoom aspiration catheter with an 021 over an 018 inch micro guidewire with moderate J configuration to the left MCA just proximal to the occlusion. Using a torque device access was obtained with the micro guidewire into a branch of the inferior division of the left MCA followed by the microcatheter. The guidewire was removed. A gentle arteriogram performed through the  microcatheter demonstrated significantly attenuated caliber of the inferior division extending all the way to its proximal aspect. The Zoom aspiration catheter was then advanced and engaged into the occluded left MCA. The micro guidewire and the microcatheter were removed. Constant aspiration was then applied at the hub of the Zoom aspiration catheter for 2 minutes using a Penumbra aspiration pump. The aspiration catheter was then removed with proximal flow arrest. Arteriogram performed through the balloon guide catheter in the left internal carotid artery following reversal of flow arrest now demonstrated improved flow into the distal left middle cerebral artery M1 segment extending with the opacification of significantly attenuated caliber of the superior and inferior divisions. A second pass was then made using an 044 Zoom aspiration catheter with an microcatheter over again an 018 inch Aristotle micro guidewire. The micro guidewire was then gently advanced through the inferior division of the left middle cerebral artery with moderate resistance followed by the microcatheter into the proximal M3 region. The guidewire was removed. Slow aspiration of blood was noted at the hub of the microcatheter and was retrieved more proximally. The 044 aspiration catheter was now advanced with significant resistance into the region of the origin of the inferior division. Aspiration was started following removal of the microcatheter and the micro guidewire. A control arteriogram performed  after a minute and a half aspiration again demonstrate minimally improved caliber and flow through the superior and the inferior divisions. The delayed arterial phase suggest presence of a dilated inferior division distal to its origin. At this time, a Socrates 038 aspiration was advanced over an 028 inch micro guidewire to the occluded inferior division. A 016 double angled micro guidewire was then gently to gain access through the severely  stenotic inferior division origin. The Socrates guide catheter was advanced into the more dilated segment of the inferior division in the M3 segment. The guidewire was removed. A gentle control arteriogram performed through the Socrates catheter distally demonstrated slow flow into the distal distribution with stagnation of contrast more proximally. Aspiration was initiated through the aspiration catheter for 2 minutes. This was then removed. A control arteriogram performed through the balloon guide catheter in the left internal carotid artery demonstrated only marginal opacification of the inferior division proximally. 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 The delayed arterial phase demonstrated retrograde opacification of the left MCA distribution from the branches of the callosal marginal and pericallosal branches of the left anterior cerebral artery and also from the P3 segment of the left posterior cerebral artery confirmed on a selective left vertebral artery arteriogram. Stent placement at the origin of the left MCA inferior division was not attempted due to the presence of prominent collaterals as described above supplying the territory of the left middle cerebral artery. Also there was increased risk of intracranial hemorrhage due to the use of dual antiplatelets to protect the stent from thrombosis, due to the fact the patient was on Xarelto. An 8 French Angio-Seal closure device was then deployed for hemostasis at the right groin puncture site. Distal pulses remained Dopplerable. Dorsalis pedis unchanged bilaterally. Flat panel CT of the brain demonstrated mild hyperattenuation in the left frontoparietal region suggestive of subarachnoid hemorrhage with contrast stain. The patient's general anesthesia was reversed and the patient was extubated. Upon recovery, the patient continued to have  difficulty with expression though was able to follow simple commands appropriately. Patient demonstrated no significant movement in his right arm and right leg. He was then transferred to the neuro ICU for post revascularization care. IMPRESSION: Status post partial revascularization of occluded left middle cerebral artery distal M1 segment and the inferior division with 3 passes of contact aspiration using a 055 Zoom aspiration catheter, an 044 aspiration catheter, and an 038 Socrates aspiration catheter achieving a TICI 2B revascularization. PLAN: As per referring MD. Electronically Signed   By: Julieanne Cotton M.D.   On: 10/25/2022 08:03   IR CT Head Ltd  Result Date: 10/25/2022 INDICATION: New onset worsening aphasia and right-sided hemiplegia. 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 EXAM: 1. EMERGENT LARGE VESSEL OCCLUSION THROMBOLYSIS of the head and neck. COMPARISON:  CT angiogram of the head and neck October 22, 2022. MEDICATIONS: Ancef 2 g IV antibiotic was administered within 1 hour of the procedure. ANESTHESIA/SEDATION: General anesthesia. CONTRAST:  Omnipaque 300 approximately 100 cc. FLUOROSCOPY TIME:  Fluoroscopy Time: 65 minutes  (5707 mGy). COMPLICATIONS: None immediate. TECHNIQUE: Following a full explanation of the procedure along with the potential associated complications, an informed witnessed consent was obtained. The risks of intracranial hemorrhage of 10%, worsening neurological deficit, ventilator dependency, death and inability to revascularize were all reviewed in detail with the patient's daughter. The patient was then put under general anesthesia by the Department of Anesthesiology  at [facility]. The right groin was prepped and draped in the usual sterile fashion. Thereafter using modified Seldinger technique, transfemoral access into the right common femoral artery was obtained without difficulty. Over an 0.035 inch guidewire an 8 French 25 cm  Pinnacle sheath was inserted. Through this, and also over an 0.035 inch guidewire a combination of Simmons 2 125 cm support catheter inside of an 087 95 cm balloon guide catheter was advanced to the aortic arch, and positioned initially in the left common carotid artery and then the left internal carotid artery distally. FINDINGS: The left common carotid arteriogram demonstrates the left external carotid artery and its major branches to be widely patent. The left internal carotid artery at the bulb to the cranial skull base demonstrates patency with moderate tortuosity in its mid cervical segment. The petrous, the cavernous and the supraclinoid left ICA are widely patent. The left anterior cerebral artery opacifies into the capillary and venous phases. Prompt filling via the anterior communicating artery of the right anterior cerebral A2 segment and distally is noted. The left middle cerebral artery demonstrates complete occlusion just distal to the origin of the left anterior temporal branch. Numerous fine arterial collaterals are seen emanating from the proximal aspect of the left middle cerebral artery and the left A1 segment. PROCEDURE: Through the balloon guide catheter in the distal left ICA, a combination of an 055 Zoom aspiration catheter with an 021 over an 018 inch micro guidewire with moderate J configuration to the left MCA just proximal to the occlusion. Using a torque device access was obtained with the micro guidewire into a branch of the inferior division of the left MCA followed by the microcatheter. The guidewire was removed. A gentle arteriogram performed through the microcatheter demonstrated significantly attenuated caliber of the inferior division extending all the way to its proximal aspect. The Zoom aspiration catheter was then advanced and engaged into the occluded left MCA. The micro guidewire and the microcatheter were removed. Constant aspiration was then applied at the hub of the Zoom  aspiration catheter for 2 minutes using a Penumbra aspiration pump. The aspiration catheter was then removed with proximal flow arrest. Arteriogram performed through the balloon guide catheter in the left internal carotid artery following reversal of flow arrest now demonstrated improved flow into the distal left middle cerebral artery M1 segment extending with the opacification of significantly attenuated caliber of the superior and inferior divisions. A second pass was then made using an 044 Zoom aspiration catheter with an microcatheter over again an 018 inch Aristotle micro guidewire. The micro guidewire was then gently advanced through the inferior division of the left middle cerebral artery with moderate resistance followed by the microcatheter into the proximal M3 region. The guidewire was removed. Slow aspiration of blood was noted at the hub of the microcatheter and was retrieved more proximally. The 044 aspiration catheter was now advanced with significant resistance into the region of the origin of the inferior division. Aspiration was started following removal of the microcatheter and the micro guidewire. A control arteriogram performed after a minute and a half aspiration again demonstrate minimally improved caliber and flow through the superior and the inferior divisions. The delayed arterial phase suggest presence of a dilated inferior division distal to its origin. At this time, a Socrates 038 aspiration was advanced over an 028 inch micro guidewire to the occluded inferior division. A 016 double angled micro guidewire was then gently to gain access through the severely stenotic inferior division  origin. The Socrates guide catheter was advanced into the more dilated segment of the inferior division in the M3 segment. The guidewire was removed. A gentle control arteriogram performed through the Socrates catheter distally demonstrated slow flow into the distal distribution with stagnation of contrast  more proximally. Aspiration was initiated through the aspiration catheter for 2 minutes. This was then removed. A control arteriogram performed through the balloon guide catheter in the left internal carotid artery demonstrated only marginal opacification of the inferior division proximally. 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 The delayed arterial phase demonstrated retrograde opacification of the left MCA distribution from the branches of the callosal marginal and pericallosal branches of the left anterior cerebral artery and also from the P3 segment of the left posterior cerebral artery confirmed on a selective left vertebral artery arteriogram. Stent placement at the origin of the left MCA inferior division was not attempted due to the presence of prominent collaterals as described above supplying the territory of the left middle cerebral artery. Also there was increased risk of intracranial hemorrhage due to the use of dual antiplatelets to protect the stent from thrombosis, due to the fact the patient was on Xarelto. An 8 French Angio-Seal closure device was then deployed for hemostasis at the right groin puncture site. Distal pulses remained Dopplerable. Dorsalis pedis unchanged bilaterally. Flat panel CT of the brain demonstrated mild hyperattenuation in the left frontoparietal region suggestive of subarachnoid hemorrhage with contrast stain. The patient's general anesthesia was reversed and the patient was extubated. Upon recovery, the patient continued to have difficulty with expression though was able to follow simple commands appropriately. Patient demonstrated no significant movement in his right arm and right leg. He was then transferred to the neuro ICU for post revascularization care. IMPRESSION: Status post partial revascularization of occluded left middle cerebral artery distal M1 segment  and the inferior division with 3 passes of contact aspiration using a 055 Zoom aspiration catheter, an 044 aspiration catheter, and an 038 Socrates aspiration catheter achieving a TICI 2B revascularization. PLAN: As per referring MD. Electronically Signed   By: Julieanne Cotton M.D.   On: 10/25/2022 08:03   IR ANGIO VERTEBRAL SEL SUBCLAVIAN INNOMINATE UNI L MOD SED  Result Date: 10/25/2022 INDICATION: New onset worsening aphasia and right-sided hemiplegia. 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 EXAM: 1. EMERGENT LARGE VESSEL OCCLUSION THROMBOLYSIS of the head and neck. COMPARISON:  CT angiogram of the head and neck October 22, 2022. MEDICATIONS: Ancef 2 g IV antibiotic was administered within 1 hour of the procedure. ANESTHESIA/SEDATION: General anesthesia. CONTRAST:  Omnipaque 300 approximately 100 cc. FLUOROSCOPY TIME:  Fluoroscopy Time: 65 minutes  (5707 mGy). COMPLICATIONS: None immediate. TECHNIQUE: Following a full explanation of the procedure along with the potential associated complications, an informed witnessed consent was obtained. The risks of intracranial hemorrhage of 10%, worsening neurological deficit, ventilator dependency, death and inability to revascularize were all reviewed in detail with the patient's daughter. The patient was then put under general anesthesia by the Department of Anesthesiology at [facility]. The right groin was prepped and draped in the usual sterile fashion. Thereafter using modified Seldinger technique, transfemoral access into the right common femoral artery was obtained without difficulty. Over an 0.035 inch guidewire an 8 French 25 cm Pinnacle sheath was inserted. Through this, and also over an 0.035 inch guidewire a combination of Simmons 2 125 cm support catheter inside of an  087 95 cm balloon guide catheter was advanced to the aortic arch, and positioned initially in the left common carotid artery and then the left internal  carotid artery distally. FINDINGS: The left common carotid arteriogram demonstrates the left external carotid artery and its major branches to be widely patent. The left internal carotid artery at the bulb to the cranial skull base demonstrates patency with moderate tortuosity in its mid cervical segment. The petrous, the cavernous and the supraclinoid left ICA are widely patent. The left anterior cerebral artery opacifies into the capillary and venous phases. Prompt filling via the anterior communicating artery of the right anterior cerebral A2 segment and distally is noted. The left middle cerebral artery demonstrates complete occlusion just distal to the origin of the left anterior temporal branch. Numerous fine arterial collaterals are seen emanating from the proximal aspect of the left middle cerebral artery and the left A1 segment. PROCEDURE: Through the balloon guide catheter in the distal left ICA, a combination of an 055 Zoom aspiration catheter with an 021 over an 018 inch micro guidewire with moderate J configuration to the left MCA just proximal to the occlusion. Using a torque device access was obtained with the micro guidewire into a branch of the inferior division of the left MCA followed by the microcatheter. The guidewire was removed. A gentle arteriogram performed through the microcatheter demonstrated significantly attenuated caliber of the inferior division extending all the way to its proximal aspect. The Zoom aspiration catheter was then advanced and engaged into the occluded left MCA. The micro guidewire and the microcatheter were removed. Constant aspiration was then applied at the hub of the Zoom aspiration catheter for 2 minutes using a Penumbra aspiration pump. The aspiration catheter was then removed with proximal flow arrest. Arteriogram performed through the balloon guide catheter in the left internal carotid artery following reversal of flow arrest now demonstrated improved flow into  the distal left middle cerebral artery M1 segment extending with the opacification of significantly attenuated caliber of the superior and inferior divisions. A second pass was then made using an 044 Zoom aspiration catheter with an microcatheter over again an 018 inch Aristotle micro guidewire. The micro guidewire was then gently advanced through the inferior division of the left middle cerebral artery with moderate resistance followed by the microcatheter into the proximal M3 region. The guidewire was removed. Slow aspiration of blood was noted at the hub of the microcatheter and was retrieved more proximally. The 044 aspiration catheter was now advanced with significant resistance into the region of the origin of the inferior division. Aspiration was started following removal of the microcatheter and the micro guidewire. A control arteriogram performed after a minute and a half aspiration again demonstrate minimally improved caliber and flow through the superior and the inferior divisions. The delayed arterial phase suggest presence of a dilated inferior division distal to its origin. At this time, a Socrates 038 aspiration was advanced over an 028 inch micro guidewire to the occluded inferior division. A 016 double angled micro guidewire was then gently to gain access through the severely stenotic inferior division origin. The Socrates guide catheter was advanced into the more dilated segment of the inferior division in the M3 segment. The guidewire was removed. A gentle control arteriogram performed through the Socrates catheter distally demonstrated slow flow into the distal distribution with stagnation of contrast more proximally. Aspiration was initiated through the aspiration catheter for 2 minutes. This was then removed. A control arteriogram performed through the balloon  guide catheter in the left internal carotid artery demonstrated only marginal opacification of the inferior division proximally.  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 The delayed arterial phase demonstrated retrograde opacification of the left MCA distribution from the branches of the callosal marginal and pericallosal branches of the left anterior cerebral artery and also from the P3 segment of the left posterior cerebral artery confirmed on a selective left vertebral artery arteriogram. Stent placement at the origin of the left MCA inferior division was not attempted due to the presence of prominent collaterals as described above supplying the territory of the left middle cerebral artery. Also there was increased risk of intracranial hemorrhage due to the use of dual antiplatelets to protect the stent from thrombosis, due to the fact the patient was on Xarelto. An 8 French Angio-Seal closure device was then deployed for hemostasis at the right groin puncture site. Distal pulses remained Dopplerable. Dorsalis pedis unchanged bilaterally. Flat panel CT of the brain demonstrated mild hyperattenuation in the left frontoparietal region suggestive of subarachnoid hemorrhage with contrast stain. The patient's general anesthesia was reversed and the patient was extubated. Upon recovery, the patient continued to have difficulty with expression though was able to follow simple commands appropriately. Patient demonstrated no significant movement in his right arm and right leg. He was then transferred to the neuro ICU for post revascularization care. IMPRESSION: Status post partial revascularization of occluded left middle cerebral artery distal M1 segment and the inferior division with 3 passes of contact aspiration using a 055 Zoom aspiration catheter, an 044 aspiration catheter, and an 038 Socrates aspiration catheter achieving a TICI 2B revascularization. PLAN: As per referring MD. Electronically Signed   By: Julieanne Cotton M.D.   On:  10/25/2022 08:03   ECHOCARDIOGRAM COMPLETE BUBBLE STUDY  Result Date: 10/23/2022    ECHOCARDIOGRAM REPORT   Patient Name:   DAMARRION MCRORIE Date of Exam: 10/23/2022 Medical Rec #:  [id number]       Height:       75.0 in Accession #:    [id number]      Weight:       224.0 lb Date of Birth:  Jul 30, 1950        BSA:          2.303 m Patient Age:    71 years        BP:           120/95 mmHg Patient Gender: M               HR:           68 bpm. Exam Location:  Inpatient Procedure: 2D Echo, Color Doppler, Cardiac Doppler, Saline Contrast Bubble Study            and Intracardiac Opacification Agent Indications:    Stroke  History:        Patient has no prior history of Echocardiogram examinations.                 CHF, CAD, Stroke, Arrythmias:Atrial Fibrillation; Risk                 Factors:Dyslipidemia. History of DVT.  Sonographer:    Raeford Razor Referring Phys: [id number] Reyne Dumas [facility]  Sonographer Comments: Suboptimal subcostal window. IMPRESSIONS  1. Left ventricular ejection fraction, by estimation, is 65 to 70%. The left ventricle has normal function. The left ventricle has no regional wall motion abnormalities. Left  ventricular diastolic parameters are indeterminate.  2. Right ventricular systolic function is normal. The right ventricular size is normal. Tricuspid regurgitation signal is inadequate for assessing PA pressure.  3. The mitral valve is abnormal. No evidence of mitral valve regurgitation. No evidence of mitral stenosis.  4. The aortic valve is tricuspid. Aortic valve regurgitation is not visualized. No aortic stenosis is present.  5. Agitated saline contrast bubble study was negative, with no evidence of any interatrial shunt. Comparison(s): No prior Echocardiogram. FINDINGS  Left Ventricle: Left ventricular ejection fraction, by estimation, is 65 to 70%. The left ventricle has normal function. The left ventricle has no regional wall motion abnormalities. Definity contrast agent was given IV to delineate  the left ventricular  endocardial borders. The left ventricular internal cavity size was normal in size. There is no left ventricular hypertrophy. Left ventricular diastolic parameters are indeterminate. Right Ventricle: The right ventricular size is normal. No increase in right ventricular wall thickness. Right ventricular systolic function is normal. Tricuspid regurgitation signal is inadequate for assessing PA pressure. Left Atrium: Left atrial size was normal in size. Right Atrium: Right atrial size was normal in size. Pericardium: There is no evidence of pericardial effusion. Mitral Valve: Mitral valve prolapse. The mitral valve is abnormal. No evidence of mitral valve regurgitation. No evidence of mitral valve stenosis. Tricuspid Valve: The tricuspid valve is normal in structure. Tricuspid valve regurgitation is not demonstrated. No evidence of tricuspid stenosis. Aortic Valve: The aortic valve is tricuspid. Aortic valve regurgitation is not visualized. No aortic stenosis is present. Aortic valve peak gradient measures 4.4 mmHg. Pulmonic Valve: The pulmonic valve was normal in structure. Pulmonic valve regurgitation is not visualized. No evidence of pulmonic stenosis. Aorta: The aortic root, ascending aorta and aortic arch are all structurally normal, with no evidence of dilitation or obstruction. IAS/Shunts: No atrial level shunt detected by color flow Doppler. Agitated saline contrast was given intravenously to evaluate for intracardiac shunting. Agitated saline contrast bubble study was negative, with no evidence of any interatrial shunt.  LEFT VENTRICLE PLAX 2D LVIDd:         4.40 cm      Diastology LVIDs:         2.50 cm      LV e' medial:    8.27 cm/s LV PW:         0.90 cm      LV E/e' medial:  9.1 LV IVS:        0.90 cm      LV e' lateral:   12.30 cm/s LVOT diam:     2.10 cm      LV E/e' lateral: 6.1 LV SV:         51 LV SV Index:   22 LVOT Area:     3.46 cm  LV Volumes (MOD) LV vol d, MOD A2C: 112.0  ml LV vol d, MOD A4C: 107.0 ml LV vol s, MOD A2C: 33.5 ml LV vol s, MOD A4C: 30.2 ml LV SV MOD A2C:     78.5 ml LV SV MOD A4C:     107.0 ml LV SV MOD BP:      77.0 ml RIGHT VENTRICLE            IVC RV Basal diam:  2.20 cm    IVC diam: 1.10 cm RV S prime:     7.70 cm/s TAPSE (M-mode): 1.5 cm LEFT ATRIUM             Index  RIGHT ATRIUM           Index LA diam:        4.20 cm 1.82 cm/m   RA Area:     15.00 cm LA Vol (A2C):   56.9 ml 24.71 ml/m  RA Volume:   29.60 ml  12.85 ml/m LA Vol (A4C):   31.3 ml 13.59 ml/m LA Biplane Vol: 42.8 ml 18.59 ml/m  AORTIC VALVE AV Area (Vmax): 2.73 cm AV Vmax:        105.00 cm/s AV Peak Grad:   4.4 mmHg LVOT Vmax:      82.70 cm/s LVOT Vmean:     50.500 cm/s LVOT VTI:       0.147 m  AORTA Ao Root diam: 3.10 cm Ao Asc diam:  3.50 cm MITRAL VALVE MV Area (PHT): 4.10 cm    SHUNTS MV Decel Time: 185 msec    Systemic VTI:  0.15 m MV E velocity: 75.30 cm/s  Systemic Diam: 2.10 cm Riley Lam MD Electronically signed by Riley Lam MD Signature Date/Time: 10/23/2022/12:43:39 PM    Final    MR BRAIN WO CONTRAST  Result Date: 10/23/2022 CLINICAL DATA:  72 year old male code stroke presentation yesterday. Acute left M1 occlusion. Status post endovascular revascularization. Mild subarachnoid hemorrhage suspected postprocedural. EXAM: MRI HEAD WITHOUT CONTRAST TECHNIQUE: Multiplanar, multiecho pulse sequences of the brain and surrounding structures were obtained without intravenous contrast. COMPARISON:  CTA, CT head, and CTP yesterday. No previous brain MRI. FINDINGS: Brain: Confluent restricted diffusion throughout the left basal ganglia (series 5, image 77) and patchy additional scattered cortical and white matter diffusion restriction, mostly in the middle sylvian division. Underlying chronic left MCA territory infarct with encephalomalacia mostly in the posterior division. Cytotoxic edema in the affected areas.Blood products on SWI which seem more related with  the chronic encephalomalacia than acutely within subarachnoid spaces. And no IVH is identified. No midline shift. Only mild mass effect on the left lateral ventricle from the left basal ganglia. No posterior fossa diffusion restriction, but there is punctate restricted diffusion in the contralateral right caudate (series 7, image 56). Small chronic right cerebellar infarct. Cervicomedullary junction and pituitary are within normal limits. Vascular: Major intracranial vascular flow voids are preserved proximally, although the left MCA appears highly asymmetric on both T2 and FLAIR imaging suggesting abnormal flow persists (series 11, image 22). Skull and upper cervical spine: Visualized bone marrow signal is within normal limits. Negative for age visible cervical spine. Sinuses/Orbits: Postoperative changes to the right globe. Paranasal Visualized paranasal sinuses and mastoids are stable and well aerated. Other: Visible internal auditory structures appear normal. IMPRESSION: 1. Evidence of ongoing abnormal flow in the Left MCA on this routine noncontrast exam. 2. Left MCA territory acute infarcts most confluent in the left basal ganglia. No significant subarachnoid blood by MRI, head CT would be more sensitive. But chronic left MCA territory encephalomalacia with abundant hemosiderin. No significant intracranial mass effect. 3. Superimposed punctate acute infarct in the contralateral right caudate. Electronically Signed   By: Odessa Fleming M.D.   On: 10/23/2022 04:59   CT C-SPINE NO CHARGE  Result Date: 10/22/2022 CLINICAL DATA:  Code stroke EXAM: CT CERVICAL SPINE WITHOUT CONTRAST TECHNIQUE: Multidetector CT imaging of the cervical spine was performed without intravenous contrast. Multiplanar CT image reconstructions were also generated. RADIATION DOSE REDUCTION: This exam was performed according to the departmental dose-optimization program which includes automated exposure control, adjustment of the mA and/or kV  according to patient size and/or use  of iterative reconstruction technique. COMPARISON:  Cervical spine CT 02/09/2021 FINDINGS: Alignment: Normal. There is no jumped or perched facet or other evidence of traumatic malalignment. Skull base and vertebrae: Skull base alignment is maintained. Vertebral body heights are preserved. There is no evidence of acute fracture. There are bulky anterior osteophytes throughout the cervical spine. Soft tissues and spinal canal: No prevertebral fluid or swelling. No visible canal hematoma. Disc levels: Bulky anterior osteophytes throughout the cervical spine are again seen. 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 Upper chest: The imaged lung apices are clear. Other: None. IMPRESSION: No acute fracture or traumatic malalignment of the cervical spine Electronically Signed   By: Lesia Hausen M.D.   On: 10/22/2022 11:12   CT ANGIO HEAD NECK W WO CM W PERF (CODE STROKE)  Result Date: 10/22/2022 CLINICAL DATA:  Code stroke EXAM: CT ANGIOGRAPHY HEAD AND NECK CT PERFUSION BRAIN TECHNIQUE: Multidetector CT imaging of the head and neck was performed using the standard protocol during bolus administration of intravenous contrast. Multiplanar CT image reconstructions and MIPs were obtained to evaluate the vascular anatomy. Carotid stenosis measurements (when applicable) are obtained utilizing NASCET criteria, using the distal internal carotid diameter as the denominator. Multiphase CT imaging of the brain was performed following IV bolus contrast injection. Subsequent parametric perfusion maps were calculated using RAPID software. RADIATION DOSE REDUCTION: This exam was performed according to the departmental dose-optimization program which includes automated exposure control, adjustment of the mA and/or kV according to patient size and/or use of iterative reconstruction technique. CONTRAST:  OMNIPAQUE  IOHEXOL 350 MG/ML SOLN COMPARISON:  None Available. Same-day noncontrast CT head FINDINGS: CTA NECK FINDINGS Aortic arch: The imaged aortic arch is normal. The origins of the major branch vessels are patent. The subclavian arteries are patent to the level imaged. Right carotid system: The right common, internal, and external carotid arteries are patent, without hemodynamically significant stenosis or occlusion there is no evidence of dissection or aneurysm. Left carotid system: The left common, internal, and external carotid arteries are patent, with minimal plaque at the bifurcation but no hemodynamically significant stenosis or occlusion. There is no evidence of dissection or aneurysm. Vertebral arteries: The vertebral arteries are patent, without hemodynamically significant stenosis or occlusion there is no evidence of dissection or aneurysm. Skeleton: The cervical spine is assessed in full on the separately dictated cervical spine CT. Other neck: The soft tissues of the neck are unremarkable. Upper chest: The imaged lung apices are clear. Review of the MIP images confirms the above findings CTA HEAD FINDINGS Anterior circulation: The intracranial ICAs are patent, with mild stenosis of the communicating segment on the right. There is acute occlusion of the distal left M1 segment with some reconstitution of flow in distal branches. The right M1 segment and distal branches are patent The bilateral ACAS are patent, without proximal stenosis or occlusion. The right A1 segment is hypoplastic. The anterior communicating artery is normal. There is no aneurysm or AVM. Posterior circulation: The bilateral V4 segments are patent. The basilar artery is patent. The major cerebellar arteries appear patent. The bilateral PCAs are patent, without proximal stenosis or occlusion. There is a diminutive left posterior communicating artery with a probable infundibular origin. No definite right posterior communicating artery is  identified. There is no aneurysm or AVM. Venous sinuses: Patent. Anatomic variants: As above. Review of the MIP images confirms the above findings CT Brain Perfusion Findings:  ASPECTS: 9 CBF (<30%) Volume: 0mL Perfusion (Tmax>6.0s) volume: 65mL Mismatch Volume: 65mL Infarction Location:The penumbra is identified in the left MCA distribution. IMPRESSION: 1. Acute left M1 occlusion with some reconstitution of flow in distal branches. 2. 65 cc ischemic penumbra in the MCA distribution. No infarct core identified. 3. No other hemodynamically significant stenosis or occlusion in the vasculature of the head or neck. These results were called by telephone at the time of interpretation on 10/22/2022 at 10:56 am to provider Dr Trilby Leaver, who verbally acknowledged these results. Electronically Signed   By: Lesia Hausen M.D.   On: 10/22/2022 11:08   CT HEAD CODE STROKE WO CONTRAST  Result Date: 10/22/2022 CLINICAL DATA:  Code stroke.  No localizing sign provided EXAM: CT HEAD WITHOUT CONTRAST TECHNIQUE: Contiguous axial images were obtained from the base of the skull through the vertex without intravenous contrast. RADIATION DOSE REDUCTION: This exam was performed according to the departmental dose-optimization program which includes automated exposure control, adjustment of the mA and/or kV according to patient size and/or use of iterative reconstruction technique. COMPARISON:  CT Head 02/09/21 FINDINGS: Brain: Redemonstrated is a chronic left MCA territory infarct involving the insula and anterior temporal lobe. Compared to prior exam there is new hypodensity in the left basal ganglia (series 2, image 20). No evidence of hemorrhage, hydrocephalus, extra-axial collection or mass lesion/mass effect. Vascular: No hyperdense vessel or unexpected calcification. Skull: Normal. Negative for fracture or focal lesion. Sinuses/Orbits: No middle ear effusion. Trace left mastoid effusion. Paranasal sinuses are clear. Right lens  replacement and a right scleral band. Orbits are otherwise unremarkable. Other: None. ASPECTS [facility] Stroke Program Early CT Score): 9 when accounting for chronic findings. IMPRESSION: 1. Possible new age indeterminate infarcts in the left basal ganglia. Aspects is 9 when accounting for chronic findings. 2. Chronic left MCA territory infarct. Findings were paged to Dr. Viviann Spare on 10/22/2022 at 10:31 a.m. Electronically Signed   By: Lorenza Cambridge M.D.   On: 10/22/2022 10:31    Labs:  CBC: Recent Labs    10/14/22 0848 10/22/22 1016 10/22/22 1026 10/23/22 0618  WBC 3.7* 4.8  --  5.9  HGB 14.2 15.0 15.3 13.1  HCT 43.2 45.0 45.0 37.9*  PLT 195 121*  --  127*    COAGS: Recent Labs    10/22/22 1016  INR 1.2  APTT 27    BMP: Recent Labs    10/14/22 0848 10/22/22 1026 10/23/22 0618  NA 141 138 135  K 4.7 4.3 4.7  CL 109 108 102  CO2 23  --  20*  GLUCOSE 78 102* 105*  BUN 25* 20 15  CALCIUM 9.6  --  8.6*  CREATININE 1.32* 1.40* 1.36*  GFRNONAA 58*  --  56*    LIVER FUNCTION TESTS: No results for input(s): "BILITOT", "AST", "ALT", "ALKPHOS", "PROT", "ALBUMIN" in the last 8760 hours.  Assessment and Plan:  72 y/o M with admitted 10/22/22 as a code stroke found to have left MCA occlusion s/p thrombectomy that same day in NIR seen for follow up.  Patient with improving dysarthria, still with little movement on the right side. Otherwise doing well. Planned for inpatient rehab.  No further NIR needs at this time, patient does not require outpatient NIR follow up. Discharge plan per primary team.   Please call with questions or concerns.  Electronically Signed: Villa Herb, PA-C 10/25/2022, 9:45 AM   I spent a total of 15 Minutes at the the patient's bedside AND on the  patient's hospital floor or unit, greater than 50% of which was counseling/coordinating care for code stroke.

## 2022-10-26 ENCOUNTER — Encounter (HOSPITAL_COMMUNITY): Payer: Self-pay | Admitting: Physical Medicine and Rehabilitation

## 2022-10-26 ENCOUNTER — Encounter (HOSPITAL_COMMUNITY): Admission: RE | Payer: Self-pay | Source: Ambulatory Visit

## 2022-10-26 ENCOUNTER — Ambulatory Visit (HOSPITAL_COMMUNITY)
Admission: RE | Admit: 2022-10-26 | Payer: No Typology Code available for payment source | Source: Ambulatory Visit | Admitting: Orthopedic Surgery

## 2022-10-26 ENCOUNTER — Inpatient Hospital Stay (HOSPITAL_COMMUNITY)
Admission: RE | Admit: 2022-10-26 | Discharge: 2022-11-19 | DRG: 057 | Disposition: A | Discharge status: Skilled Nursing Facility | Payer: No Typology Code available for payment source | Source: Intra-hospital | Attending: Physical Medicine and Rehabilitation | Admitting: Physical Medicine and Rehabilitation

## 2022-10-26 ENCOUNTER — Encounter (HOSPITAL_COMMUNITY): Payer: Self-pay | Admitting: Internal Medicine

## 2022-10-26 DIAGNOSIS — Z8679 Personal history of other diseases of the circulatory system: Secondary | ICD-10-CM

## 2022-10-26 DIAGNOSIS — Z79899 Other long term (current) drug therapy: Secondary | ICD-10-CM

## 2022-10-26 DIAGNOSIS — R414 Neurologic neglect syndrome: Secondary | ICD-10-CM | POA: Diagnosis present

## 2022-10-26 DIAGNOSIS — I69391 Dysphagia following cerebral infarction: Secondary | ICD-10-CM

## 2022-10-26 DIAGNOSIS — I4891 Unspecified atrial fibrillation: Secondary | ICD-10-CM | POA: Diagnosis present

## 2022-10-26 DIAGNOSIS — N3289 Other specified disorders of bladder: Secondary | ICD-10-CM | POA: Diagnosis present

## 2022-10-26 DIAGNOSIS — G8111 Spastic hemiplegia affecting right dominant side: Secondary | ICD-10-CM | POA: Diagnosis not present

## 2022-10-26 DIAGNOSIS — F4321 Adjustment disorder with depressed mood: Secondary | ICD-10-CM | POA: Diagnosis present

## 2022-10-26 DIAGNOSIS — I69320 Aphasia following cerebral infarction: Secondary | ICD-10-CM | POA: Diagnosis not present

## 2022-10-26 DIAGNOSIS — F32A Depression, unspecified: Secondary | ICD-10-CM | POA: Diagnosis present

## 2022-10-26 DIAGNOSIS — E7800 Pure hypercholesterolemia, unspecified: Secondary | ICD-10-CM | POA: Diagnosis present

## 2022-10-26 DIAGNOSIS — R4189 Other symptoms and signs involving cognitive functions and awareness: Secondary | ICD-10-CM | POA: Diagnosis present

## 2022-10-26 DIAGNOSIS — E896 Postprocedural adrenocortical (-medullary) hypofunction: Secondary | ICD-10-CM | POA: Diagnosis present

## 2022-10-26 DIAGNOSIS — N39498 Other specified urinary incontinence: Secondary | ICD-10-CM | POA: Diagnosis not present

## 2022-10-26 DIAGNOSIS — I129 Hypertensive chronic kidney disease with stage 1 through stage 4 chronic kidney disease, or unspecified chronic kidney disease: Secondary | ICD-10-CM | POA: Diagnosis present

## 2022-10-26 DIAGNOSIS — R482 Apraxia: Secondary | ICD-10-CM

## 2022-10-26 DIAGNOSIS — D649 Anemia, unspecified: Secondary | ICD-10-CM | POA: Diagnosis present

## 2022-10-26 DIAGNOSIS — I69398 Other sequelae of cerebral infarction: Secondary | ICD-10-CM

## 2022-10-26 DIAGNOSIS — I69353 Hemiplegia and hemiparesis following cerebral infarction affecting right non-dominant side: Secondary | ICD-10-CM | POA: Diagnosis present

## 2022-10-26 DIAGNOSIS — N189 Chronic kidney disease, unspecified: Secondary | ICD-10-CM | POA: Diagnosis present

## 2022-10-26 DIAGNOSIS — M1611 Unilateral primary osteoarthritis, right hip: Secondary | ICD-10-CM | POA: Diagnosis present

## 2022-10-26 DIAGNOSIS — T450X5A Adverse effect of antiallergic and antiemetic drugs, initial encounter: Secondary | ICD-10-CM | POA: Diagnosis not present

## 2022-10-26 DIAGNOSIS — Z85528 Personal history of other malignant neoplasm of kidney: Secondary | ICD-10-CM

## 2022-10-26 DIAGNOSIS — J439 Emphysema, unspecified: Secondary | ICD-10-CM | POA: Diagnosis present

## 2022-10-26 DIAGNOSIS — R339 Retention of urine, unspecified: Secondary | ICD-10-CM | POA: Diagnosis present

## 2022-10-26 DIAGNOSIS — Z86718 Personal history of other venous thrombosis and embolism: Secondary | ICD-10-CM

## 2022-10-26 DIAGNOSIS — K5901 Slow transit constipation: Secondary | ICD-10-CM | POA: Diagnosis not present

## 2022-10-26 DIAGNOSIS — I951 Orthostatic hypotension: Secondary | ICD-10-CM | POA: Diagnosis not present

## 2022-10-26 DIAGNOSIS — Z9079 Acquired absence of other genital organ(s): Secondary | ICD-10-CM

## 2022-10-26 DIAGNOSIS — Z96642 Presence of left artificial hip joint: Secondary | ICD-10-CM | POA: Diagnosis present

## 2022-10-26 DIAGNOSIS — R1312 Dysphagia, oropharyngeal phase: Secondary | ICD-10-CM | POA: Diagnosis present

## 2022-10-26 DIAGNOSIS — M47816 Spondylosis without myelopathy or radiculopathy, lumbar region: Secondary | ICD-10-CM | POA: Diagnosis present

## 2022-10-26 DIAGNOSIS — I639 Cerebral infarction, unspecified: Secondary | ICD-10-CM | POA: Diagnosis not present

## 2022-10-26 DIAGNOSIS — Z978 Presence of other specified devices: Secondary | ICD-10-CM

## 2022-10-26 DIAGNOSIS — Z923 Personal history of irradiation: Secondary | ICD-10-CM | POA: Diagnosis not present

## 2022-10-26 DIAGNOSIS — Z8546 Personal history of malignant neoplasm of prostate: Secondary | ICD-10-CM

## 2022-10-26 DIAGNOSIS — R159 Full incontinence of feces: Secondary | ICD-10-CM | POA: Diagnosis not present

## 2022-10-26 DIAGNOSIS — F1990 Other psychoactive substance use, unspecified, uncomplicated: Secondary | ICD-10-CM | POA: Diagnosis not present

## 2022-10-26 DIAGNOSIS — I69390 Apraxia following cerebral infarction: Secondary | ICD-10-CM

## 2022-10-26 DIAGNOSIS — Z905 Acquired absence of kidney: Secondary | ICD-10-CM

## 2022-10-26 DIAGNOSIS — E869 Volume depletion, unspecified: Secondary | ICD-10-CM | POA: Diagnosis not present

## 2022-10-26 DIAGNOSIS — K5900 Constipation, unspecified: Secondary | ICD-10-CM | POA: Diagnosis not present

## 2022-10-26 DIAGNOSIS — D631 Anemia in chronic kidney disease: Secondary | ICD-10-CM | POA: Diagnosis present

## 2022-10-26 DIAGNOSIS — E871 Hypo-osmolality and hyponatremia: Secondary | ICD-10-CM

## 2022-10-26 DIAGNOSIS — C61 Malignant neoplasm of prostate: Secondary | ICD-10-CM

## 2022-10-26 DIAGNOSIS — R252 Cramp and spasm: Secondary | ICD-10-CM | POA: Diagnosis not present

## 2022-10-26 DIAGNOSIS — I69322 Dysarthria following cerebral infarction: Secondary | ICD-10-CM

## 2022-10-26 DIAGNOSIS — D696 Thrombocytopenia, unspecified: Secondary | ICD-10-CM | POA: Diagnosis present

## 2022-10-26 DIAGNOSIS — E785 Hyperlipidemia, unspecified: Secondary | ICD-10-CM | POA: Diagnosis not present

## 2022-10-26 DIAGNOSIS — N179 Acute kidney failure, unspecified: Secondary | ICD-10-CM | POA: Diagnosis present

## 2022-10-26 DIAGNOSIS — Z96652 Presence of left artificial knee joint: Secondary | ICD-10-CM | POA: Diagnosis present

## 2022-10-26 DIAGNOSIS — I10 Essential (primary) hypertension: Secondary | ICD-10-CM | POA: Diagnosis not present

## 2022-10-26 DIAGNOSIS — I63512 Cerebral infarction due to unspecified occlusion or stenosis of left middle cerebral artery: Secondary | ICD-10-CM | POA: Diagnosis present

## 2022-10-26 DIAGNOSIS — Z955 Presence of coronary angioplasty implant and graft: Secondary | ICD-10-CM

## 2022-10-26 DIAGNOSIS — Z8249 Family history of ischemic heart disease and other diseases of the circulatory system: Secondary | ICD-10-CM

## 2022-10-26 DIAGNOSIS — Z8619 Personal history of other infectious and parasitic diseases: Secondary | ICD-10-CM

## 2022-10-26 DIAGNOSIS — R911 Solitary pulmonary nodule: Secondary | ICD-10-CM | POA: Diagnosis present

## 2022-10-26 DIAGNOSIS — I2510 Atherosclerotic heart disease of native coronary artery without angina pectoris: Secondary | ICD-10-CM | POA: Diagnosis present

## 2022-10-26 DIAGNOSIS — Z7901 Long term (current) use of anticoagulants: Secondary | ICD-10-CM

## 2022-10-26 DIAGNOSIS — I69392 Facial weakness following cerebral infarction: Secondary | ICD-10-CM

## 2022-10-26 SURGERY — ARTHROPLASTY, HIP, TOTAL, ANTERIOR APPROACH
Anesthesia: Spinal | Site: Hip | Laterality: Right

## 2022-10-26 MED ORDER — TAMSULOSIN HCL 0.4 MG PO CAPS
0.4000 mg | ORAL_CAPSULE | Freq: Every day | ORAL | Status: DC
  Administered 2022-10-27: 0.4 mg via ORAL
  Filled 2022-10-26: qty 1

## 2022-10-26 MED ORDER — TRAZODONE HCL 50 MG PO TABS: 25.0000 mg | ORAL_TABLET | Freq: Every evening | ORAL | Status: DC | PRN

## 2022-10-26 MED ORDER — ROSUVASTATIN CALCIUM 40 MG PO TABS: 40.0000 mg | ORAL_TABLET | Freq: Every day | ORAL | 0 refills | Status: AC

## 2022-10-26 MED ORDER — ESCITALOPRAM OXALATE 10 MG PO TABS
10.0000 mg | ORAL_TABLET | Freq: Every day | ORAL | Status: DC
  Administered 2022-10-27 – 2022-11-09 (×14): 10 mg via ORAL
  Filled 2022-10-26 (×14): qty 1

## 2022-10-26 MED ORDER — LORATADINE 10 MG PO TABS: 10.0000 mg | ORAL_TABLET | Freq: Every day | ORAL | Status: DC

## 2022-10-26 MED ORDER — TAMSULOSIN HCL 0.4 MG PO CAPS: 0.4000 mg | ORAL_CAPSULE | Freq: Every day | ORAL | 0 refills | Status: DC

## 2022-10-26 MED ORDER — LIDOCAINE HCL URETHRAL/MUCOSAL 2 % EX GEL
CUTANEOUS | Status: DC | PRN
  Administered 2022-10-31 – 2022-11-01 (×2): 1 via TOPICAL
  Administered 2022-11-02 – 2022-11-03 (×2): 6 via TOPICAL
  Filled 2022-10-26 (×4): qty 6

## 2022-10-26 MED ORDER — AMLODIPINE BESYLATE 10 MG PO TABS
10.0000 mg | ORAL_TABLET | Freq: Every day | ORAL | Status: DC
  Administered 2022-10-27 – 2022-10-28 (×2): 10 mg via ORAL
  Filled 2022-10-26 (×2): qty 1

## 2022-10-26 MED ORDER — SENNOSIDES-DOCUSATE SODIUM 8.6-50 MG PO TABS: 1.0000 | ORAL_TABLET | Freq: Every evening | ORAL | Status: DC | PRN

## 2022-10-26 MED ORDER — ACETAMINOPHEN 325 MG PO TABS
325.0000 mg | ORAL_TABLET | ORAL | Status: DC | PRN
  Administered 2022-10-30: 325 mg via ORAL
  Administered 2022-10-30 – 2022-11-02 (×3): 650 mg via ORAL
  Filled 2022-10-26 (×3): qty 2

## 2022-10-26 MED ORDER — FLEET ENEMA RE ENEM: 1.0000 | ENEMA | Freq: Once | RECTAL | Status: DC | PRN

## 2022-10-26 MED ORDER — AMLODIPINE BESYLATE 10 MG PO TABS: 10.0000 mg | ORAL_TABLET | Freq: Every day | ORAL | 0 refills | Status: DC

## 2022-10-26 MED ORDER — DIPHENHYDRAMINE HCL 25 MG PO CAPS: 25.0000 mg | ORAL_CAPSULE | Freq: Four times a day (QID) | ORAL | Status: DC | PRN

## 2022-10-26 MED ORDER — ROSUVASTATIN CALCIUM 20 MG PO TABS
40.0000 mg | ORAL_TABLET | Freq: Every day | ORAL | Status: DC
  Administered 2022-10-27 – 2022-11-19 (×22): 40 mg via ORAL
  Filled 2022-10-26 (×26): qty 2

## 2022-10-26 MED ORDER — GUAIFENESIN-DM 100-10 MG/5ML PO SYRP: 5.0000 mL | ORAL_SOLUTION | Freq: Four times a day (QID) | ORAL | Status: DC | PRN

## 2022-10-26 MED ORDER — LORATADINE 10 MG PO TABS
10.0000 mg | ORAL_TABLET | Freq: Every day | ORAL | Status: DC
  Administered 2022-10-27 – 2022-11-19 (×22): 10 mg via ORAL
  Filled 2022-10-26 (×24): qty 1

## 2022-10-26 MED ORDER — BISACODYL 10 MG RE SUPP: 10.0000 mg | Freq: Every day | RECTAL | Status: DC | PRN

## 2022-10-26 MED ORDER — ESCITALOPRAM OXALATE 10 MG PO TABS: 10.0000 mg | ORAL_TABLET | Freq: Every day | ORAL | 0 refills | Status: DC

## 2022-10-26 MED ORDER — SODIUM CHLORIDE 0.9 % IV SOLN: INTRAVENOUS | Status: DC

## 2022-10-26 MED ORDER — THIAMINE MONONITRATE 100 MG PO TABS
100.0000 mg | ORAL_TABLET | Freq: Every day | ORAL | Status: DC
  Administered 2022-10-27 – 2022-11-19 (×22): 100 mg via ORAL
  Filled 2022-10-26 (×24): qty 1

## 2022-10-26 MED ORDER — RIVAROXABAN 20 MG PO TABS: 20.0000 mg | ORAL_TABLET | Freq: Every day | ORAL | Status: DC

## 2022-10-26 MED ORDER — ALUM & MAG HYDROXIDE-SIMETH 200-200-20 MG/5ML PO SUSP: 30.0000 mL | ORAL | Status: DC | PRN

## 2022-10-26 MED ORDER — RIVAROXABAN 20 MG PO TABS: 20.0000 mg | ORAL_TABLET | Freq: Every day | ORAL | 0 refills | Status: DC

## 2022-10-26 MED ORDER — ADULT MULTIVITAMIN W/MINERALS CH
1.0000 | ORAL_TABLET | Freq: Every day | ORAL | Status: DC
  Administered 2022-10-27 – 2022-11-19 (×22): 1 via ORAL
  Filled 2022-10-26 (×24): qty 1

## 2022-10-26 NOTE — Progress Notes (Signed)
Inpatient Rehab Admissions Coordinator:    I have insurance approval and a bed available for pt to admit to CIR today. Dr. Pearlean Brownie in agreement.  Will let pt/family and TOC team know.   Estill Dooms, PT, DPT Admissions Coordinator [phone] 10/26/22  10:19 AM

## 2022-10-26 NOTE — H&P (Signed)
Physical Medicine and Rehabilitation Admission H&P    Chief Complaint  Patient presents with   Deficits due to stroke.     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 He was noted to have aphasia with dysarthria and right sided weakness. UDS positive for cocaine CT head showed possible new indeterminate infarct in left basal ganglia and CTA head/neck showed acute L M1 occlusion with 65 cc ischemic penumbra without core infarct.  He underwent cerebral angio with TICI 2 revascularization of distal L-MCA M1 segment wit post procedure CT head showing mild L-SAH in distal parietal cortical region. MRI brain done showing new completed L-BG stroke with  with abnormal FLAIR signal in proximal L-MCA s/o slow flow v/s occlusion and was not a candidate for repeat revascularization due to risk of hemorrhagic transformation.   2D echo showed EF 65-70% and no wall abnormality.  He was started on Lexapro for mood stabilized. On 08/12 pm, he was found to be lethargic, non verbal and transient left sided weakness. RR evalauted patient and patient back to baseline at end of event. Repeat CT head on 08/12 showed evolving L-MCA infarct with 1.2 cm X 0.8 cm hyperdense region. Question contrast pooling v/s site of IPH--short term follow up recommended and diffuse hyperdense appearence of L-MCA involving M1 worrisome for thrombus.  He was treated with 500 cc fluid bolus with some improvement in mental status. EEG done showing  mild to moderate diffuse encephalopathy without seizures. On D2, thin with supervision for safety.     Dr. Pearlean Brownie felt that stroke cardioembolic in setting of A fib on Xarelto but question of compliance. Therapy has been working  with patient who continues to be limited by dysphagia, ahpasia with intermittent following one step commands and answering Y/N question inconsistently, question of right inattention with ADL task and requires mod to max assist with ADL tasks and standing attempt. CIR recommended due to functional decline.  At baseline, patient is independent with ADLs and was able to ambulate without AD per brother. He managed home/cooked as his wife had renal transplant a few weeks ago., his sister was helping them out. .    Review of Systems  Unable to perform ROS: Medical condition  Neurological:  Positive for speech change and focal weakness.     Past Medical History:  Diagnosis Date   Anemia    Anxiety    Arthritis    Atrial fibrillation (HCC)    CAD (coronary artery disease)    PCI to LAD 2012   CHF (congestive heart failure) (HCC)    Chronic back pain    Depression    /notes 07/19/2005 (09/30/2012)   H/O ETOH abuse    Hepatitis C    pt denies this hx on 09/30/2012   High cholesterol    History of DVT of lower extremity    "left; long time ago" (09/30/2012)   Hypertension    OA (osteoarthritis) of hip    moderate to severe Right hip   Prostate cancer (HCC)    Renal cell carcinoma (HCC)    Stroke (HCC) 11/18/2011   [name] 07/31/2012; expressive aphasia noted on 09/30/2012    Past Surgical History:  Procedure Laterality  Date   COLONOSCOPY N/A 10/01/2012   Procedure: COLONOSCOPY;  Surgeon: Louis Meckel, MD;  Location: [facility] ENDOSCOPY;  Service: Endoscopy;  Laterality: N/A;   CORONARY ANGIOPLASTY WITH STENT PLACEMENT     ESOPHAGOGASTRODUODENOSCOPY N/A 10/01/2012   Procedure: ESOPHAGOGASTRODUODENOSCOPY (EGD);  Surgeon: Louis Meckel, MD;  Location: [facility] ENDOSCOPY;  Service: Endoscopy;  Laterality: N/A;   EYE SURGERY Right 1975   /notes 07/19/2005; "injured playing football" (09/29/2012)   GIVENS CAPSULE STUDY N/A 10/02/2012   Procedure: GIVENS CAPSULE STUDY;  Surgeon: Louis Meckel, MD;   Location: [facility] ENDOSCOPY;  Service: Endoscopy;  Laterality: N/A;   IR ANGIO VERTEBRAL SEL SUBCLAVIAN INNOMINATE UNI L MOD SED  10/22/2022   IR CT HEAD LTD  10/22/2022   IR CT HEAD LTD  10/22/2022   IR PERCUTANEOUS ART THROMBECTOMY/INFUSION INTRACRANIAL INC DIAG ANGIO  10/22/2022   JOINT REPLACEMENT  01/03/2007   LUMBAR DISC SURGERY  2001   herniated disc/notes 07/19/2005 (09/30/2012)   NEPHRECTOMY  12/26/2013   Laparascopic , [facility]   RADIOLOGY WITH ANESTHESIA N/A 10/22/2022   Procedure: IR WITH ANESTHESIA;  Surgeon: Radiologist, Medication, MD;  Location: [facility] OR;  Service: Radiology;  Laterality: N/A;   TOTAL HIP ARTHROPLASTY Left 01/03/2007   [name] 01/03/2007 (09/30/2012)   TOTAL KNEE ARTHROPLASTY Left 09/12/2014   Procedure: LEFT TOTAL KNEE ARTHROPLASTY;  Surgeon: Tarry Kos, MD;  Location: [facility] OR;  Service: Orthopedics;  Laterality: Left;    Family History  Problem Relation Age of Onset   Hypertension Mother    Deep vein thrombosis Mother    Cancer Neg Hx     Social History:  Married to second wife for 7 years. Has 2 children who live out of state. Per reports that he has never smoked. He has never used smokeless tobacco. Per reports --may have quit ETOH about 3 years ago? Per reports that he does not use drugs.   Allergies: No Known Allergies   Medications Prior to Admission  Medication Sig Dispense Refill   [START ON 10/27/2022] amLODipine (NORVASC) 10 MG tablet Take 1 tablet (10 mg total) by mouth daily. 30 tablet 0   diclofenac Sodium (VOLTAREN ARTHRITIS PAIN) 1 % GEL Apply 2 g topically as needed.     [START ON 10/27/2022] escitalopram (LEXAPRO) 10 MG tablet Take 1 tablet (10 mg total) by mouth daily. 30 tablet 0   lidocaine (LIDODERM) 5 % Place 1 patch onto the skin daily.     loratadine (CLARITIN) 10 MG tablet Take 10 mg by mouth daily.     Multiple Vitamin (MULTIVITAMIN ADULT PO) Take 1 tablet by mouth daily.     naltrexone (DEPADE) 50 MG tablet Take 50 mg by mouth daily.      [START ON 10/27/2022] rivaroxaban (XARELTO) 20 MG TABS tablet Take 1 tablet (20 mg total) by mouth daily with breakfast. 30 tablet 0   [START ON 10/27/2022] rosuvastatin (CRESTOR) 40 MG tablet Take 1 tablet (40 mg total) by mouth daily. 30 tablet 0   [START ON 10/27/2022] tamsulosin (FLOMAX) 0.4 MG CAPS capsule Take 1 capsule (0.4 mg total) by mouth daily. 30 capsule 0   thiamine (VITAMIN B-1) 100 MG tablet Take 100 mg by mouth daily.       Home: Home Living Family/patient expects to be discharged to:: Private residence Living Arrangements: Alone Additional Comments: pt unable to provide due to aphasia. pt noted w/ wedding band on- when asked with yes/no questions - pt reports being married for 30 years  though no spouse listed in chart   Functional History: Prior Function Prior Level of Function : Patient poor historian/Family not available   Functional Status:  Mobility: Bed Mobility Overal bed mobility: Needs Assistance Bed Mobility: Rolling, Sidelying to Sit Rolling: Min assist Sidelying to sit: Mod assist, +2 for safety/equipment Supine to sit: +2 for physical assistance, Min assist Sit to supine: Min assist, +2 for physical assistance General bed mobility comments: use of bedrail to pull self to L side for cleanup. assisted to EOB from this position w/ assist needed to get BLE off of bed and MinA to lift trunk Transfers Overall transfer level: Needs assistance Equipment used: Ambulation equipment used Transfers: Sit to/from Stand, Bed to chair/wheelchair/BSC Sit to Stand: Mod assist, +2 safety/equipment, +2 physical assistance Bed to/from chair/wheelchair/BSC transfer type:: Via Lift equipment Transfer via Lift Equipment: Stedy General transfer comment: Mod A x2 to stand in [place] (increased assist on R weaker side) and transferred to chair with Stedy. Pt able to stand from lower recliner as well with same assist level.   ADL: ADL Overall ADL's : Needs  assistance/impaired Eating/Feeding: Set up, Sitting, Supervision/ safety Eating/Feeding Details (indicate cue type and reason): assist to open containers, able to answer yes/no to if pt would like salt/pepper, etc on certain items. minor cues to take smaller bites but pt able to self feed with LUE well Grooming: Minimal assistance, Sitting Upper Body Bathing: Maximal assistance, Sitting Lower Body Bathing: Maximal assistance, +2 for physical assistance, +2 for safety/equipment, Sit to/from stand Upper Body Dressing : Maximal assistance Lower Body Dressing: Maximal assistance, +2 for physical assistance, +2 for safety/equipment, Sit to/from stand Toileting- Clothing Manipulation and Hygiene: Total assistance, Bed level Toileting - Clothing Manipulation Details (indicate cue type and reason): for clean up due to primofit malfunction General ADL Comments: Pt with R nondominant UE and RLE weakness, questionable vision impairments requiring significant amount of assist to complete ADLs   Cognition: Cognition Overall Cognitive Status: Difficult to assess Arousal/Alertness: Awake/alert Orientation Level: Oriented to person Awareness: Impaired Awareness Impairment: Emergent impairment Problem Solving: Impaired Problem Solving Impairment: Verbal basic Cognition Arousal: Alert Behavior During Therapy: Flat affect Overall Cognitive Status: Difficult to assess Area of Impairment: Awareness, Problem solving Following Commands: Follows one step commands inconsistently Awareness: Emergent Problem Solving: Requires verbal cues, Requires tactile cues General Comments: Pt with expressive aphasia, fairly accurate appearing yes/no questions though family not present to confirm PLOF. consistent command following. shows some insight into deficits (shaking head when asked if he could move RUE, indicating being wet in bed) Difficult to assess due to: Impaired communication     Blood pressure (!) 133/94,  pulse 67, temperature 98.2 F (36.8 C), temperature source Oral, resp. rate 18, height 6\' 3"  (1.905 m), weight 97.6 kg, SpO2 100%.  General: No apparent distress HEENT: Head is normocephalic, atraumatic, PERRLA, EOMI, sclera anicteric, oral mucosa pink and moist Neck: Supple without JVD or lymphadenopathy Heart: Reg rate and rhythm. No murmurs rubs or gallops Chest: CTA bilaterally without wheezes, rales, or rhonchi; no distress Abdomen: Soft, non-tender, non-distended, bowel sounds positive. Extremities: No clubbing, cyanosis, or edema. Pulses are 2+ Psych: Pt's affect is appropriate. Pt is cooperative Skin: Clean and intact without signs of breakdown Neuro: Alert and oriented to person, hospital, year with choices, expressive greater than receptive aphasia, answers mostly with "yes" and nodding, right facial droop, disconjugate gaze-unable to get patient to participate with extraocular movement testing, hearing intact to voice, follows majority simple commands, unable to  name and repeat, decreased shoulder shrug on the right,, question R inattention, Strength 5 out of 5 in left upper extremity left lower extremity Strength 0 out of 5 throughout right upper extremity and right lower extremity Sensation to light touch decreased in right upper extremity and right lower extremity No ataxia or dysmetria noted on the left side Hyporeflexive at bilateral biceps, triceps, brachioradialis, Achilles DTR 2+ right patella, absent on left  Musculoskeletal:  Normal muscle bulk Decreased tone throughout right upper extremity with exception of some mild tone in his elbow flexors Hypertonic  right lower extremity No Joint swelling noted Left TKA healed incision noted   Results for orders placed or performed during the hospital encounter of 10/22/22 (from the past 48 hour(s))  Glucose, capillary     Status: Abnormal   Collection Time: 10/25/22 12:55 PM  Result Value Ref Range   Glucose-Capillary 103  (H) 70 - 99 mg/dL    Comment: Glucose reference range applies only to samples taken after fasting for at least 8 hours.  CBC     Status: Abnormal   Collection Time: 10/25/22  2:31 PM  Result Value Ref Range   WBC 5.9 4.0 - 10.5 K/uL   RBC 4.82 4.22 - 5.81 MIL/uL   Hemoglobin 14.5 13.0 - 17.0 g/dL   HCT 13.2 44.0 - 10.2 %   MCV 88.0 80.0 - 100.0 fL   MCH 30.1 26.0 - 34.0 pg   MCHC 34.2 30.0 - 36.0 g/dL   RDW 72.5 36.6 - 44.0 %   Platelets 131 (L) 150 - 400 K/uL    Comment: REPEATED TO VERIFY   nRBC 0.0 0.0 - 0.2 %    Comment: Performed at [facility], [address] [place], [place] [address]  Basic metabolic panel     Status: Abnormal   Collection Time: 10/25/22  2:31 PM  Result Value Ref Range   Sodium 132 (L) 135 - 145 mmol/L   Potassium 4.5 3.5 - 5.1 mmol/L   Chloride 103 98 - 111 mmol/L   CO2 17 (L) 22 - 32 mmol/L   Glucose, Bld 119 (H) 70 - 99 mg/dL    Comment: Glucose reference range applies only to samples taken after fasting for at least 8 hours.   BUN 24 (H) 8 - 23 mg/dL   Creatinine, Ser 5.95 (H) 0.61 - 1.24 mg/dL   Calcium 8.9 8.9 - 63.8 mg/dL   GFR, Estimated 54 (L) >60 mL/min    Comment: (NOTE) Calculated using the CKD-EPI Creatinine Equation (2021)    Anion gap 12 5 - 15    Comment: Performed at [facility], [address] [place], [place] [address]   EEG adult  Result Date: 10/25/2022 Charlsie Quest, MD     10/25/2022  4:41 PM Patient Name: Apollo Macher MRN: [id number] Epilepsy Attending: Charlsie Quest Referring Physician/Provider: Mathews Argyle, NP Date: 10/25/2022 Duration: 25.25 mins Patient history: 72yo M with Left MCA stroke getting eeg to evaluate for seizure. Level of alertness: Awake, drowsy AEDs during EEG study: None Technical aspects: This EEG study was done with scalp electrodes positioned according to the 10-20 International system of electrode placement. Electrical activity was reviewed with band pass filter of 1-70Hz ,  sensitivity of 7 uV/mm, display speed of 41mm/sec with a 60Hz  notched filter applied as appropriate. EEG data were recorded continuously and digitally stored.  Video monitoring was available and reviewed as appropriate. Description:  The posterior dominant rhythm consists  of 8 Hz activity of moderate voltage (25-35 uV) seen predominantly in posterior head regions, asymmetric ( left<right) and reactive to eye opening and eye closing.  Drowsiness was characterized by attenuation of posterior dominant rhythm. EEG showed continuous 3 to 6 Hz theta-delta slowing in left hemisphere as well as intermittent generalized 3-5Hz  theta-delta slowing. Hyperventilation and photic stimulation were not performed.    ABNORMALITY - Continuous slow, left  hemisphere - Intermittent slow, generalized - Background asymmetry, left<right  IMPRESSION: This study is suggestive of cortical dysfunction arising from left hemisphere likely secondary to underlying stroke. Additionally there is mild to moderate diffuse encephalopathy. No seizures or epileptiform discharges were seen throughout the recording.  Priyanka Annabelle Harman   CT HEAD WO CONTRAST ( )  Addendum Date: 10/25/2022   ADDENDUM REPORT: 10/25/2022 13:59 ADDENDUM: Alternatively, a CTA head could also be considered for more definitive characterization. Electronically Signed   By: Lorenza Cambridge M.D.   On: 10/25/2022 13:59   Result Date: 10/25/2022 CLINICAL DATA:  Neuro deficit, acute, stroke suspected EXAM: CT HEAD WITHOUT CONTRAST TECHNIQUE: Contiguous axial images were obtained from the base of the skull through the vertex without intravenous contrast. RADIATION DOSE REDUCTION: This exam was performed according to the departmental dose-optimization program which includes automated exposure control, adjustment of the mA and/or kV according to patient size and/or use of iterative reconstruction technique. COMPARISON:  Brain MRI 10/23/2022 FINDINGS: Brain: Evolving left MCA territory  infarct. There is a focal 1.2 x 0.8 cm hyperdenseregion in the region of the left sylvian fissure, which is along the course of the left MCA branches, and could represent contrast pooling or a site of parenchymal hemorrhage. No mass effect. No hydrocephalus. No extra-axial fluid collection. Vascular: Diffusely hyperdense appearance of the left MCA involving the M1 through the M3 segments. Skull: Normal. Negative for fracture or focal lesion. Sinuses/Orbits: No middle ear or mastoid effusion. Paranasal sinuses are clear. Right lens replacement with a scleral band. Other: None. IMPRESSION: 1. Evolving left MCA territory infarct. Focal 1.2 x 0.8 cm hyperdense region in the region of the left Sylvian fissure could represent contrast pooling or a site of parenchymal hemorrhage. Recommend short term follow-up CT to ensure stability. 2. Diffusely hyperdense appearance of the left MCA involving the M1 through the M3 segments, worrisome for thrombus. These results will be called to the ordering clinician or representative by the Radiologist Assistant, and communication documented in the PACS or [employer]. Electronically Signed: By: Lorenza Cambridge M.D. On: 10/25/2022 13:56      Blood pressure (!) 133/94, pulse 67, temperature 98.2 F (36.8 C), temperature source Oral, resp. rate 18, height 6\' 3"  (1.905 m), weight 97.6 kg, SpO2 100%.  Body mass index is 26.89 kg/m.   Medical Problem List and Plan: 1. 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 Pt has hx of prior CVA with residual aphasia.              -patient may shower             -ELOS/Goals: 14-18, PT/OT/SLP min A to sup  -Order PRAFO/WHO  -Admit to CIR  2.  Antithrombotics: -DVT/anticoagulation:  Pharmaceutical: Xarelto w/breakfast?             -antiplatelet therapy: N/A 3. Pain Management:  Tylenol. Local measures.  --Per chart review was on tramadol? Naltrexone?  Lidocaine patches?   4. Mood/Behavior/Sleep: LCSW to follow for evaluation and support.              -  antipsychotic agents: N/A 5. Neuropsych/cognition: This patient is not capable of making decisions on his own behalf. 6. Skin/Wound Care: Routine pressure relief measures 7. Fluids/Electrolytes/Nutrition: Monitor I/O. Check CMET in am             --assist with meals 8. HTN: Home meds Amlodipine 5mg , Hydralazine 25mg  daily,  goal SBP < 180, long term goal normotensive -Monitor BP TID continue amlodipine 9. A fib: Monitor HR TID. On xarelto. Heart rate controlled 10.  Thrombocytopenia: Recheck platelets in am. Monitor for signs of bleeding.  11. Dysphagia: On D2,thins. Encourage fluid intake 12. Acute on chronic renal failure:  SCr 1.32 at admission.  --Continue to monitor and encourage intake 13. Hyponatremia: Na dropped to 132 in 48 hours. Recheck in am             --monitor with question of IPH or pooling on CT 08/12--> repeat in 1-2 days? 14.  H/o CAD s/p PCI: Monitor for symptoms with increase in activity.             --continue Crestor.  15.  Situational depression: Lexapro 10mg  added on acute.  16. Hx of multiple cancers: R-RCC s/p nephrectomy 2015.  -Right adrenal pheochromocytoma  s/p right adrenalectomy 2017.  -Prostate CA s/p IMRT 2018.  Continue flomax -RUL nodule being monitored. 17. H/o Emphysema?: Noted on CT chest on care everywhere.  18. HLD. Continue statin 19. Substance abuse. Encourage cessation       Jacquelynn Cree, PA-C 10/26/2022  I have personally performed a face to face diagnostic evaluation of this patient and formulated the key components of the plan.  Additionally, I have personally reviewed laboratory data, imaging studies, as well as relevant notes and concur with the physician assistant's documentation above.  The patient's status has not changed from the original H&P.  Any changes in documentation from the acute care chart have been noted  above.  Fanny Dance, MD, [place]

## 2022-10-26 NOTE — Discharge Summary (Addendum)
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:  left MCA s/p mechanical thrombectomy of left M1 with TICI 2c  revascularization due to atrial fibrillation Secondary Diagnoses: Hypertension Hyperlipidemia Diabetes Type II Atrial fibrillation Substance abuse- UDS + for cocaine Post-stroke dysphagia Atrial fibrillation  Allergies as of 10/26/2022   No Known Allergies      Medication List     STOP taking these medications    acetaminophen 650 MG CR tablet Commonly known as: TYLENOL   amLODipine 5 MG tablet Commonly known as: NORVASC Replaced by: amLODipine 10 MG tablet   aspirin 81 MG chewable tablet   hydrALAZINE 25 MG tablet Commonly known as: APRESOLINE   Rivaroxaban 15 MG Tabs tablet Commonly known as: XARELTO Replaced by: rivaroxaban 20 MG Tabs tablet   sildenafil 100 MG tablet Commonly known as: VIAGRA   traZODone 50 MG tablet Commonly known as: DESYREL       TAKE these medications    amLODipine 10 MG tablet Commonly known as: NORVASC Take 1 tablet (10 mg total) by mouth daily. Start taking on: October 27, 2022 Replaces: amLODipine 5 MG tablet   escitalopram 10 MG tablet Commonly known as: LEXAPRO Take 1 tablet (10 mg total) by mouth daily. Start taking on: October 27, 2022   lidocaine 5 % Commonly known as: LIDODERM Place 1 patch onto the skin daily.   loratadine 10 MG tablet Commonly known as: CLARITIN Take 10 mg by mouth daily.   MULTIVITAMIN ADULT PO Take 1 tablet by mouth daily.   naltrexone 50 MG tablet Commonly known as: DEPADE Take 50 mg by mouth daily.   rivaroxaban 20 MG Tabs tablet Commonly known as: XARELTO Take  1 tablet (20 mg total) by mouth daily with breakfast. Start taking on: October 27, 2022 Replaces: Rivaroxaban 15 MG Tabs tablet   rosuvastatin 40 MG tablet Commonly known as: CRESTOR Take 1 tablet (40 mg total) by mouth daily. Start taking on: October 27, 2022 What changed:  medication strength how much to take   tamsulosin 0.4 MG Caps capsule Commonly known as: FLOMAX Take 1 capsule (0.4 mg total) by mouth daily. Start taking on: October 27, 2022 What changed: additional instructions   thiamine 100 MG tablet Commonly known as: Vitamin B-1 Take 100 mg by mouth daily.   Voltaren Arthritis Pain 1 % Gel Generic drug: diclofenac Sodium Apply 2 g topically as needed.       LABORATORY STUDIES CBC    Component Value Date/Time   WBC 5.9 10/25/2022 1431   RBC 4.82 10/25/2022 1431   HGB 14.5 10/25/2022 1431   HCT 42.4 10/25/2022 1431   PLT 131 (L) 10/25/2022 1431   MCV 88.0 10/25/2022 1431   MCH 30.1 10/25/2022 1431   MCHC 34.2 10/25/2022 1431   RDW 14.3 10/25/2022 1431   LYMPHSABS 0.8 10/23/2022 0618   MONOABS 0.6 10/23/2022 0618   EOSABS 0.0 10/23/2022 0618   BASOSABS 0.0 10/23/2022 0618   CMP    Component Value Date/Time   NA 132 (L) 10/25/2022 1431   K 4.5 10/25/2022 1431   CL 103 10/25/2022 1431   CO2 17 (L) 10/25/2022 1431  GLUCOSE 119 (H) 10/25/2022 1431   BUN 24 (H) 10/25/2022 1431   CREATININE 1.41 (H) 10/25/2022 1431   CALCIUM 8.9 10/25/2022 1431   PROT 6.9 01/01/2016 1254   ALBUMIN 3.2 (L) 01/01/2016 1254   AST 30 01/01/2016 1254   ALT 21 01/01/2016 1254   ALKPHOS 59 01/01/2016 1254   BILITOT 2.1 (H) 01/01/2016 1254   GFRNONAA 54 (L) 10/25/2022 1431   GFRAA 52 (L) 01/03/2016 0616   COAGS Lab Results  Component Value Date   INR 1.2 10/22/2022   INR 1.44 01/01/2016   INR 1.19 10/06/2015   Lipid Panel    Component Value Date/Time   CHOL 142 10/23/2022 0618   TRIG 42 10/23/2022 0618   HDL 42 10/23/2022 0618   CHOLHDL 3.4 10/23/2022 0618    VLDL 8 10/23/2022 0618   LDLCALC 92 10/23/2022 0618   HgbA1C  Lab Results  Component Value Date   HGBA1C 5.2 10/22/2022   Urine Drug Screen  Drugs of Abuse     Component Value Date/Time   LABOPIA NONE DETECTED 10/22/2022 1554   COCAINSCRNUR POSITIVE (A) 10/22/2022 1554   LABBENZ NONE DETECTED 10/22/2022 1554   AMPHETMU NONE DETECTED 10/22/2022 1554   THCU NONE DETECTED 10/22/2022 1554   LABBARB NONE DETECTED 10/22/2022 1554    Alcohol Level    Component Value Date/Time   ETH <10 10/22/2022 1016   SIGNIFICANT DIAGNOSTIC STUDIES EEG adult  Result Date: 10/25/2022 Charlsie Quest, MD     10/25/2022  4:41 PM Patient Name: Frank Barron MRN: [id number] Epilepsy Attending: Charlsie Quest Referring Physician/Provider: Mathews Argyle, NP Date: 10/25/2022 Duration: 25.25 mins Patient history: 72yo M with Left MCA stroke getting eeg to evaluate for seizure. Level of alertness: Awake, drowsy AEDs during EEG study: None Technical aspects: This EEG study was done with scalp electrodes positioned according to the 10-20 International system of electrode placement. Electrical activity was reviewed with band pass filter of 1-70Hz , sensitivity of 7 uV/mm, display speed of 67mm/sec with a 60Hz  notched filter applied as appropriate. EEG data were recorded continuously and digitally stored.  Video monitoring was available and reviewed as appropriate. Description:  The posterior dominant rhythm consists of 8 Hz activity of moderate voltage (25-35 uV) seen predominantly in posterior head regions, asymmetric ( left<right) and reactive to eye opening and eye closing.  Drowsiness was characterized by attenuation of posterior dominant rhythm. EEG showed continuous 3 to 6 Hz theta-delta slowing in left hemisphere as well as intermittent generalized 3-5Hz  theta-delta slowing. Hyperventilation and photic stimulation were not performed.    ABNORMALITY - Continuous slow, left  hemisphere - Intermittent slow,  generalized - Background asymmetry, left<right  IMPRESSION: This study is suggestive of cortical dysfunction arising from left hemisphere likely secondary to underlying stroke. Additionally there is mild to moderate diffuse encephalopathy. No seizures or epileptiform discharges were seen throughout the recording.  Frank Annabelle Harman   CT HEAD WO CONTRAST ( )  Addendum Date: 10/25/2022   ADDENDUM REPORT: 10/25/2022 13:59 ADDENDUM: Alternatively, a CTA head could also be considered for more definitive characterization. Electronically Signed   By: Lorenza Cambridge M.D.   On: 10/25/2022 13:59   Result Date: 10/25/2022 CLINICAL DATA:  Neuro deficit, acute, stroke suspected EXAM: CT HEAD WITHOUT CONTRAST TECHNIQUE: Contiguous axial images were obtained from the base of the skull through the vertex without intravenous contrast. RADIATION DOSE REDUCTION: This exam was performed according to the departmental dose-optimization program which includes automated exposure control, adjustment of the  mA and/or kV according to patient size and/or use of iterative reconstruction technique. COMPARISON:  Brain MRI 10/23/2022 FINDINGS: Brain: Evolving left MCA territory infarct. There is a focal 1.2 x 0.8 cm hyperdenseregion in the region of the left sylvian fissure, which is along the course of the left MCA branches, and could represent contrast pooling or a site of parenchymal hemorrhage. No mass effect. No hydrocephalus. No extra-axial fluid collection. Vascular: Diffusely hyperdense appearance of the left MCA involving the M1 through the M3 segments. Skull: Normal. Negative for fracture or focal lesion. Sinuses/Orbits: No middle ear or mastoid effusion. Paranasal sinuses are clear. Right lens replacement with a scleral band. Other: None. IMPRESSION: 1. Evolving left MCA territory infarct. Focal 1.2 x 0.8 cm hyperdense region in the region of the left Sylvian fissure could represent contrast pooling or a site of parenchymal  hemorrhage. Recommend short term follow-up CT to ensure stability. 2. Diffusely hyperdense appearance of the left MCA involving the M1 through the M3 segments, worrisome for thrombus. These results will be called to the ordering clinician or representative by the Radiologist Assistant, and communication documented in the PACS or [employer]. Electronically Signed: By: Lorenza Cambridge M.D. On: 10/25/2022 13:56   IR PERCUTANEOUS ART THROMBECTOMY/INFUSION INTRACRANIAL INC DIAG ANGIO  Result Date: 10/25/2022 INDICATION: New onset worsening aphasia and right-sided hemiplegia. 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 EXAM: 1. EMERGENT LARGE VESSEL OCCLUSION THROMBOLYSIS of the head and neck. COMPARISON:  CT angiogram of the head and neck October 22, 2022. MEDICATIONS: Ancef 2 g IV antibiotic was administered within 1 hour of the procedure. ANESTHESIA/SEDATION: General anesthesia. CONTRAST:  Omnipaque 300 approximately 100 cc. FLUOROSCOPY TIME:  Fluoroscopy Time: 65 minutes  (5707 mGy). COMPLICATIONS: None immediate. TECHNIQUE: Following a full explanation of the procedure along with the potential associated complications, an informed witnessed consent was obtained. The risks of intracranial hemorrhage of 10%, worsening neurological deficit, ventilator dependency, death and inability to revascularize were all reviewed in detail with the patient's daughter. The patient was then put under general anesthesia by the Department of Anesthesiology at [facility]. The right groin was prepped and draped in the usual sterile fashion. Thereafter using modified Seldinger technique, transfemoral access into the right common femoral artery was obtained without difficulty. Over an 0.035 inch guidewire an 8 French 25 cm Pinnacle sheath was inserted. Through this, and also over an 0.035 inch guidewire a combination of Simmons 2 125 cm support catheter inside of an 087 95 cm balloon guide catheter was  advanced to the aortic arch, and positioned initially in the left common carotid artery and then the left internal carotid artery distally. FINDINGS: The left common carotid arteriogram demonstrates the left external carotid artery and its major branches to be widely patent. The left internal carotid artery at the bulb to the cranial skull base demonstrates patency with moderate tortuosity in its mid cervical segment. The petrous, the cavernous and the supraclinoid left ICA are widely patent. The left anterior cerebral artery opacifies into the capillary and venous phases. Prompt filling via the anterior communicating artery of the right anterior cerebral A2 segment and distally is noted. The left middle cerebral artery demonstrates complete occlusion just distal to the origin of the left anterior temporal branch. Numerous fine arterial collaterals are seen emanating from the proximal aspect of the left middle cerebral artery and the left A1 segment. PROCEDURE: Through the balloon guide catheter in the distal left ICA, a combination of an 055  Zoom aspiration catheter with an 021 over an 018 inch micro guidewire with moderate J configuration to the left MCA just proximal to the occlusion. Using a torque device access was obtained with the micro guidewire into a branch of the inferior division of the left MCA followed by the microcatheter. The guidewire was removed. A gentle arteriogram performed through the microcatheter demonstrated significantly attenuated caliber of the inferior division extending all the way to its proximal aspect. The Zoom aspiration catheter was then advanced and engaged into the occluded left MCA. The micro guidewire and the microcatheter were removed. Constant aspiration was then applied at the hub of the Zoom aspiration catheter for 2 minutes using a Penumbra aspiration pump. The aspiration catheter was then removed with proximal flow arrest. Arteriogram performed through the balloon guide  catheter in the left internal carotid artery following reversal of flow arrest now demonstrated improved flow into the distal left middle cerebral artery M1 segment extending with the opacification of significantly attenuated caliber of the superior and inferior divisions. A second pass was then made using an 044 Zoom aspiration catheter with an microcatheter over again an 018 inch Aristotle micro guidewire. The micro guidewire was then gently advanced through the inferior division of the left middle cerebral artery with moderate resistance followed by the microcatheter into the proximal M3 region. The guidewire was removed. Slow aspiration of blood was noted at the hub of the microcatheter and was retrieved more proximally. The 044 aspiration catheter was now advanced with significant resistance into the region of the origin of the inferior division. Aspiration was started following removal of the microcatheter and the micro guidewire. A control arteriogram performed after a minute and a half aspiration again demonstrate minimally improved caliber and flow through the superior and the inferior divisions. The delayed arterial phase suggest presence of a dilated inferior division distal to its origin. At this time, a Socrates 038 aspiration was advanced over an 028 inch micro guidewire to the occluded inferior division. A 016 double angled micro guidewire was then gently to gain access through the severely stenotic inferior division origin. The Socrates guide catheter was advanced into the more dilated segment of the inferior division in the M3 segment. The guidewire was removed. A gentle control arteriogram performed through the Socrates catheter distally demonstrated slow flow into the distal distribution with stagnation of contrast more proximally. Aspiration was initiated through the aspiration catheter for 2 minutes. This was then removed. A control arteriogram performed through the balloon guide catheter in the  left internal carotid artery demonstrated only marginal opacification of the inferior division proximally. 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 The delayed arterial phase demonstrated retrograde opacification of the left MCA distribution from the branches of the callosal marginal and pericallosal branches of the left anterior cerebral artery and also from the P3 segment of the left posterior cerebral artery confirmed on a selective left vertebral artery arteriogram. Stent placement at the origin of the left MCA inferior division was not attempted due to the presence of prominent collaterals as described above supplying the territory of the left middle cerebral artery. Also there was increased risk of intracranial hemorrhage due to the use of dual antiplatelets to protect the stent from thrombosis, due to the fact the patient was on Xarelto. An 8 French Angio-Seal closure device was then deployed for hemostasis at the right groin puncture site. Distal pulses remained Dopplerable. Dorsalis pedis unchanged bilaterally. Flat  panel CT of the brain demonstrated mild hyperattenuation in the left frontoparietal region suggestive of subarachnoid hemorrhage with contrast stain. The patient's general anesthesia was reversed and the patient was extubated. Upon recovery, the patient continued to have difficulty with expression though was able to follow simple commands appropriately. Patient demonstrated no significant movement in his right arm and right leg. He was then transferred to the neuro ICU for post revascularization care. IMPRESSION: Status post partial revascularization of occluded left middle cerebral artery distal M1 segment and the inferior division with 3 passes of contact aspiration using a 055 Zoom aspiration catheter, an 044 aspiration catheter, and an 038 Socrates aspiration catheter achieving a TICI  2B revascularization. PLAN: As per referring MD. Electronically Signed   By: Julieanne Cotton M.D.   On: 10/25/2022 08:03   IR CT Head Ltd  Result Date: 10/25/2022 INDICATION: New onset worsening aphasia and right-sided hemiplegia. 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 EXAM: 1. EMERGENT LARGE VESSEL OCCLUSION THROMBOLYSIS of the head and neck. COMPARISON:  CT angiogram of the head and neck October 22, 2022. MEDICATIONS: Ancef 2 g IV antibiotic was administered within 1 hour of the procedure. ANESTHESIA/SEDATION: General anesthesia. CONTRAST:  Omnipaque 300 approximately 100 cc. FLUOROSCOPY TIME:  Fluoroscopy Time: 65 minutes  (5707 mGy). COMPLICATIONS: None immediate. TECHNIQUE: Following a full explanation of the procedure along with the potential associated complications, an informed witnessed consent was obtained. The risks of intracranial hemorrhage of 10%, worsening neurological deficit, ventilator dependency, death and inability to revascularize were all reviewed in detail with the patient's daughter. The patient was then put under general anesthesia by the Department of Anesthesiology at [facility]. The right groin was prepped and draped in the usual sterile fashion. Thereafter using modified Seldinger technique, transfemoral access into the right common femoral artery was obtained without difficulty. Over an 0.035 inch guidewire an 8 French 25 cm Pinnacle sheath was inserted. Through this, and also over an 0.035 inch guidewire a combination of Simmons 2 125 cm support catheter inside of an 087 95 cm balloon guide catheter was advanced to the aortic arch, and positioned initially in the left common carotid artery and then the left internal carotid artery distally. FINDINGS: The left common carotid arteriogram demonstrates the left external carotid artery and its major branches to be widely patent. The left internal carotid artery at the bulb to the cranial skull base  demonstrates patency with moderate tortuosity in its mid cervical segment. The petrous, the cavernous and the supraclinoid left ICA are widely patent. The left anterior cerebral artery opacifies into the capillary and venous phases. Prompt filling via the anterior communicating artery of the right anterior cerebral A2 segment and distally is noted. The left middle cerebral artery demonstrates complete occlusion just distal to the origin of the left anterior temporal branch. Numerous fine arterial collaterals are seen emanating from the proximal aspect of the left middle cerebral artery and the left A1 segment. PROCEDURE: Through the balloon guide catheter in the distal left ICA, a combination of an 055 Zoom aspiration catheter with an 021 over an 018 inch micro guidewire with moderate J configuration to the left MCA just proximal to the occlusion. Using a torque device access was obtained with the micro guidewire into a branch of the inferior division of the left MCA followed by the microcatheter. The guidewire was removed. A gentle arteriogram performed through the microcatheter demonstrated significantly attenuated caliber of the inferior division extending all the way  to its proximal aspect. The Zoom aspiration catheter was then advanced and engaged into the occluded left MCA. The micro guidewire and the microcatheter were removed. Constant aspiration was then applied at the hub of the Zoom aspiration catheter for 2 minutes using a Penumbra aspiration pump. The aspiration catheter was then removed with proximal flow arrest. Arteriogram performed through the balloon guide catheter in the left internal carotid artery following reversal of flow arrest now demonstrated improved flow into the distal left middle cerebral artery M1 segment extending with the opacification of significantly attenuated caliber of the superior and inferior divisions. A second pass was then made using an 044 Zoom aspiration catheter with an  microcatheter over again an 018 inch Aristotle micro guidewire. The micro guidewire was then gently advanced through the inferior division of the left middle cerebral artery with moderate resistance followed by the microcatheter into the proximal M3 region. The guidewire was removed. Slow aspiration of blood was noted at the hub of the microcatheter and was retrieved more proximally. The 044 aspiration catheter was now advanced with significant resistance into the region of the origin of the inferior division. Aspiration was started following removal of the microcatheter and the micro guidewire. A control arteriogram performed after a minute and a half aspiration again demonstrate minimally improved caliber and flow through the superior and the inferior divisions. The delayed arterial phase suggest presence of a dilated inferior division distal to its origin. At this time, a Socrates 038 aspiration was advanced over an 028 inch micro guidewire to the occluded inferior division. A 016 double angled micro guidewire was then gently to gain access through the severely stenotic inferior division origin. The Socrates guide catheter was advanced into the more dilated segment of the inferior division in the M3 segment. The guidewire was removed. A gentle control arteriogram performed through the Socrates catheter distally demonstrated slow flow into the distal distribution with stagnation of contrast more proximally. Aspiration was initiated through the aspiration catheter for 2 minutes. This was then removed. A control arteriogram performed through the balloon guide catheter in the left internal carotid artery demonstrated only marginal opacification of the inferior division proximally. 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 The delayed arterial phase demonstrated retrograde opacification of the left MCA  distribution from the branches of the callosal marginal and pericallosal branches of the left anterior cerebral artery and also from the P3 segment of the left posterior cerebral artery confirmed on a selective left vertebral artery arteriogram. Stent placement at the origin of the left MCA inferior division was not attempted due to the presence of prominent collaterals as described above supplying the territory of the left middle cerebral artery. Also there was increased risk of intracranial hemorrhage due to the use of dual antiplatelets to protect the stent from thrombosis, due to the fact the patient was on Xarelto. An 8 French Angio-Seal closure device was then deployed for hemostasis at the right groin puncture site. Distal pulses remained Dopplerable. Dorsalis pedis unchanged bilaterally. Flat panel CT of the brain demonstrated mild hyperattenuation in the left frontoparietal region suggestive of subarachnoid hemorrhage with contrast stain. The patient's general anesthesia was reversed and the patient was extubated. Upon recovery, the patient continued to have difficulty with expression though was able to follow simple commands appropriately. Patient demonstrated no significant movement in his right arm and right leg. He was then transferred to the neuro ICU for post revascularization care. IMPRESSION:  Status post partial revascularization of occluded left middle cerebral artery distal M1 segment and the inferior division with 3 passes of contact aspiration using a 055 Zoom aspiration catheter, an 044 aspiration catheter, and an 038 Socrates aspiration catheter achieving a TICI 2B revascularization. PLAN: As per referring MD. Electronically Signed   By: Julieanne Cotton M.D.   On: 10/25/2022 08:03   IR CT Head Ltd  Result Date: 10/25/2022 INDICATION: New onset worsening aphasia and right-sided hemiplegia. 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 EXAM: 1. EMERGENT LARGE VESSEL  OCCLUSION THROMBOLYSIS of the head and neck. COMPARISON:  CT angiogram of the head and neck October 22, 2022. MEDICATIONS: Ancef 2 g IV antibiotic was administered within 1 hour of the procedure. ANESTHESIA/SEDATION: General anesthesia. CONTRAST:  Omnipaque 300 approximately 100 cc. FLUOROSCOPY TIME:  Fluoroscopy Time: 65 minutes  (5707 mGy). COMPLICATIONS: None immediate. TECHNIQUE: Following a full explanation of the procedure along with the potential associated complications, an informed witnessed consent was obtained. The risks of intracranial hemorrhage of 10%, worsening neurological deficit, ventilator dependency, death and inability to revascularize were all reviewed in detail with the patient's daughter. The patient was then put under general anesthesia by the Department of Anesthesiology at [facility]. The right groin was prepped and draped in the usual sterile fashion. Thereafter using modified Seldinger technique, transfemoral access into the right common femoral artery was obtained without difficulty. Over an 0.035 inch guidewire an 8 French 25 cm Pinnacle sheath was inserted. Through this, and also over an 0.035 inch guidewire a combination of Simmons 2 125 cm support catheter inside of an 087 95 cm balloon guide catheter was advanced to the aortic arch, and positioned initially in the left common carotid artery and then the left internal carotid artery distally. FINDINGS: The left common carotid arteriogram demonstrates the left external carotid artery and its major branches to be widely patent. The left internal carotid artery at the bulb to the cranial skull base demonstrates patency with moderate tortuosity in its mid cervical segment. The petrous, the cavernous and the supraclinoid left ICA are widely patent. The left anterior cerebral artery opacifies into the capillary and venous phases. Prompt filling via the anterior communicating artery of the right anterior cerebral A2 segment and  distally is noted. The left middle cerebral artery demonstrates complete occlusion just distal to the origin of the left anterior temporal branch. Numerous fine arterial collaterals are seen emanating from the proximal aspect of the left middle cerebral artery and the left A1 segment. PROCEDURE: Through the balloon guide catheter in the distal left ICA, a combination of an 055 Zoom aspiration catheter with an 021 over an 018 inch micro guidewire with moderate J configuration to the left MCA just proximal to the occlusion. Using a torque device access was obtained with the micro guidewire into a branch of the inferior division of the left MCA followed by the microcatheter. The guidewire was removed. A gentle arteriogram performed through the microcatheter demonstrated significantly attenuated caliber of the inferior division extending all the way to its proximal aspect. The Zoom aspiration catheter was then advanced and engaged into the occluded left MCA. The micro guidewire and the microcatheter were removed. Constant aspiration was then applied at the hub of the Zoom aspiration catheter for 2 minutes using a Penumbra aspiration pump. The aspiration catheter was then removed with proximal flow arrest. Arteriogram performed through the balloon guide catheter in the left internal carotid artery following reversal of flow  arrest now demonstrated improved flow into the distal left middle cerebral artery M1 segment extending with the opacification of significantly attenuated caliber of the superior and inferior divisions. A second pass was then made using an 044 Zoom aspiration catheter with an microcatheter over again an 018 inch Aristotle micro guidewire. The micro guidewire was then gently advanced through the inferior division of the left middle cerebral artery with moderate resistance followed by the microcatheter into the proximal M3 region. The guidewire was removed. Slow aspiration of blood was noted at the hub  of the microcatheter and was retrieved more proximally. The 044 aspiration catheter was now advanced with significant resistance into the region of the origin of the inferior division. Aspiration was started following removal of the microcatheter and the micro guidewire. A control arteriogram performed after a minute and a half aspiration again demonstrate minimally improved caliber and flow through the superior and the inferior divisions. The delayed arterial phase suggest presence of a dilated inferior division distal to its origin. At this time, a Socrates 038 aspiration was advanced over an 028 inch micro guidewire to the occluded inferior division. A 016 double angled micro guidewire was then gently to gain access through the severely stenotic inferior division origin. The Socrates guide catheter was advanced into the more dilated segment of the inferior division in the M3 segment. The guidewire was removed. A gentle control arteriogram performed through the Socrates catheter distally demonstrated slow flow into the distal distribution with stagnation of contrast more proximally. Aspiration was initiated through the aspiration catheter for 2 minutes. This was then removed. A control arteriogram performed through the balloon guide catheter in the left internal carotid artery demonstrated only marginal opacification of the inferior division proximally. 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 The delayed arterial phase demonstrated retrograde opacification of the left MCA distribution from the branches of the callosal marginal and pericallosal branches of the left anterior cerebral artery and also from the P3 segment of the left posterior cerebral artery confirmed on a selective left vertebral artery arteriogram. Stent placement at the origin of the left MCA inferior division was not attempted due to the  presence of prominent collaterals as described above supplying the territory of the left middle cerebral artery. Also there was increased risk of intracranial hemorrhage due to the use of dual antiplatelets to protect the stent from thrombosis, due to the fact the patient was on Xarelto. An 8 French Angio-Seal closure device was then deployed for hemostasis at the right groin puncture site. Distal pulses remained Dopplerable. Dorsalis pedis unchanged bilaterally. Flat panel CT of the brain demonstrated mild hyperattenuation in the left frontoparietal region suggestive of subarachnoid hemorrhage with contrast stain. The patient's general anesthesia was reversed and the patient was extubated. Upon recovery, the patient continued to have difficulty with expression though was able to follow simple commands appropriately. Patient demonstrated no significant movement in his right arm and right leg. He was then transferred to the neuro ICU for post revascularization care. IMPRESSION: Status post partial revascularization of occluded left middle cerebral artery distal M1 segment and the inferior division with 3 passes of contact aspiration using a 055 Zoom aspiration catheter, an 044 aspiration catheter, and an 038 Socrates aspiration catheter achieving a TICI 2B revascularization. PLAN: As per referring MD. Electronically Signed   By: Julieanne Cotton M.D.   On: 10/25/2022 08:03   IR ANGIO VERTEBRAL SEL SUBCLAVIAN INNOMINATE UNI L MOD  SED  Result Date: 10/25/2022 INDICATION: New onset worsening aphasia and right-sided hemiplegia. 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 EXAM: 1. EMERGENT LARGE VESSEL OCCLUSION THROMBOLYSIS of the head and neck. COMPARISON:  CT angiogram of the head and neck October 22, 2022. MEDICATIONS: Ancef 2 g IV antibiotic was administered within 1 hour of the procedure. ANESTHESIA/SEDATION: General anesthesia. CONTRAST:  Omnipaque 300 approximately 100 cc. FLUOROSCOPY  TIME:  Fluoroscopy Time: 65 minutes  (5707 mGy). COMPLICATIONS: None immediate. TECHNIQUE: Following a full explanation of the procedure along with the potential associated complications, an informed witnessed consent was obtained. The risks of intracranial hemorrhage of 10%, worsening neurological deficit, ventilator dependency, death and inability to revascularize were all reviewed in detail with the patient's daughter. The patient was then put under general anesthesia by the Department of Anesthesiology at [facility]. The right groin was prepped and draped in the usual sterile fashion. Thereafter using modified Seldinger technique, transfemoral access into the right common femoral artery was obtained without difficulty. Over an 0.035 inch guidewire an 8 French 25 cm Pinnacle sheath was inserted. Through this, and also over an 0.035 inch guidewire a combination of Simmons 2 125 cm support catheter inside of an 087 95 cm balloon guide catheter was advanced to the aortic arch, and positioned initially in the left common carotid artery and then the left internal carotid artery distally. FINDINGS: The left common carotid arteriogram demonstrates the left external carotid artery and its major branches to be widely patent. The left internal carotid artery at the bulb to the cranial skull base demonstrates patency with moderate tortuosity in its mid cervical segment. The petrous, the cavernous and the supraclinoid left ICA are widely patent. The left anterior cerebral artery opacifies into the capillary and venous phases. Prompt filling via the anterior communicating artery of the right anterior cerebral A2 segment and distally is noted. The left middle cerebral artery demonstrates complete occlusion just distal to the origin of the left anterior temporal branch. Numerous fine arterial collaterals are seen emanating from the proximal aspect of the left middle cerebral artery and the left A1 segment. PROCEDURE:  Through the balloon guide catheter in the distal left ICA, a combination of an 055 Zoom aspiration catheter with an 021 over an 018 inch micro guidewire with moderate J configuration to the left MCA just proximal to the occlusion. Using a torque device access was obtained with the micro guidewire into a branch of the inferior division of the left MCA followed by the microcatheter. The guidewire was removed. A gentle arteriogram performed through the microcatheter demonstrated significantly attenuated caliber of the inferior division extending all the way to its proximal aspect. The Zoom aspiration catheter was then advanced and engaged into the occluded left MCA. The micro guidewire and the microcatheter were removed. Constant aspiration was then applied at the hub of the Zoom aspiration catheter for 2 minutes using a Penumbra aspiration pump. The aspiration catheter was then removed with proximal flow arrest. Arteriogram performed through the balloon guide catheter in the left internal carotid artery following reversal of flow arrest now demonstrated improved flow into the distal left middle cerebral artery M1 segment extending with the opacification of significantly attenuated caliber of the superior and inferior divisions. A second pass was then made using an 044 Zoom aspiration catheter with an microcatheter over again an 018 inch Aristotle micro guidewire. The micro guidewire was then gently advanced through the inferior division of the left middle cerebral artery  with moderate resistance followed by the microcatheter into the proximal M3 region. The guidewire was removed. Slow aspiration of blood was noted at the hub of the microcatheter and was retrieved more proximally. The 044 aspiration catheter was now advanced with significant resistance into the region of the origin of the inferior division. Aspiration was started following removal of the microcatheter and the micro guidewire. A control arteriogram  performed after a minute and a half aspiration again demonstrate minimally improved caliber and flow through the superior and the inferior divisions. The delayed arterial phase suggest presence of a dilated inferior division distal to its origin. At this time, a Socrates 038 aspiration was advanced over an 028 inch micro guidewire to the occluded inferior division. A 016 double angled micro guidewire was then gently to gain access through the severely stenotic inferior division origin. The Socrates guide catheter was advanced into the more dilated segment of the inferior division in the M3 segment. The guidewire was removed. A gentle control arteriogram performed through the Socrates catheter distally demonstrated slow flow into the distal distribution with stagnation of contrast more proximally. Aspiration was initiated through the aspiration catheter for 2 minutes. This was then removed. A control arteriogram performed through the balloon guide catheter in the left internal carotid artery demonstrated only marginal opacification of the inferior division proximally. 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 The delayed arterial phase demonstrated retrograde opacification of the left MCA distribution from the branches of the callosal marginal and pericallosal branches of the left anterior cerebral artery and also from the P3 segment of the left posterior cerebral artery confirmed on a selective left vertebral artery arteriogram. Stent placement at the origin of the left MCA inferior division was not attempted due to the presence of prominent collaterals as described above supplying the territory of the left middle cerebral artery. Also there was increased risk of intracranial hemorrhage due to the use of dual antiplatelets to protect the stent from thrombosis, due to the fact the patient was on Xarelto. An 8  French Angio-Seal closure device was then deployed for hemostasis at the right groin puncture site. Distal pulses remained Dopplerable. Dorsalis pedis unchanged bilaterally. Flat panel CT of the brain demonstrated mild hyperattenuation in the left frontoparietal region suggestive of subarachnoid hemorrhage with contrast stain. The patient's general anesthesia was reversed and the patient was extubated. Upon recovery, the patient continued to have difficulty with expression though was able to follow simple commands appropriately. Patient demonstrated no significant movement in his right arm and right leg. He was then transferred to the neuro ICU for post revascularization care. IMPRESSION: Status post partial revascularization of occluded left middle cerebral artery distal M1 segment and the inferior division with 3 passes of contact aspiration using a 055 Zoom aspiration catheter, an 044 aspiration catheter, and an 038 Socrates aspiration catheter achieving a TICI 2B revascularization. PLAN: As per referring MD. Electronically Signed   By: Julieanne Cotton M.D.   On: 10/25/2022 08:03   ECHOCARDIOGRAM COMPLETE BUBBLE STUDY  Result Date: 10/23/2022    ECHOCARDIOGRAM REPORT   Patient Name:   Frank Barron Date of Exam: 10/23/2022 Medical Rec #:  [id number]       Height:       75.0 in Accession #:    [id number]      Weight:       224.0 lb Date of Birth:  05-01-1950  BSA:          2.303 m Patient Age:    71 years        BP:           120/95 mmHg Patient Gender: M               HR:           68 bpm. Exam Location:  Inpatient Procedure: 2D Echo, Color Doppler, Cardiac Doppler, Saline Contrast Bubble Study            and Intracardiac Opacification Agent Indications:    Stroke  History:        Patient has no prior history of Echocardiogram examinations.                 CHF, CAD, Stroke, Arrythmias:Atrial Fibrillation; Risk                 Factors:Dyslipidemia. History of DVT.  Sonographer:    Raeford Razor Referring  Phys: [id number] Reyne Dumas [facility]  Sonographer Comments: Suboptimal subcostal window. IMPRESSIONS  1. Left ventricular ejection fraction, by estimation, is 65 to 70%. The left ventricle has normal function. The left ventricle has no regional wall motion abnormalities. Left ventricular diastolic parameters are indeterminate.  2. Right ventricular systolic function is normal. The right ventricular size is normal. Tricuspid regurgitation signal is inadequate for assessing PA pressure.  3. The mitral valve is abnormal. No evidence of mitral valve regurgitation. No evidence of mitral stenosis.  4. The aortic valve is tricuspid. Aortic valve regurgitation is not visualized. No aortic stenosis is present.  5. Agitated saline contrast bubble study was negative, with no evidence of any interatrial shunt. Comparison(s): No prior Echocardiogram. FINDINGS  Left Ventricle: Left ventricular ejection fraction, by estimation, is 65 to 70%. The left ventricle has normal function. The left ventricle has no regional wall motion abnormalities. Definity contrast agent was given IV to delineate the left ventricular  endocardial borders. The left ventricular internal cavity size was normal in size. There is no left ventricular hypertrophy. Left ventricular diastolic parameters are indeterminate. Right Ventricle: The right ventricular size is normal. No increase in right ventricular wall thickness. Right ventricular systolic function is normal. Tricuspid regurgitation signal is inadequate for assessing PA pressure. Left Atrium: Left atrial size was normal in size. Right Atrium: Right atrial size was normal in size. Pericardium: There is no evidence of pericardial effusion. Mitral Valve: Mitral valve prolapse. The mitral valve is abnormal. No evidence of mitral valve regurgitation. No evidence of mitral valve stenosis. Tricuspid Valve: The tricuspid valve is normal in structure. Tricuspid valve regurgitation is not demonstrated. No evidence of  tricuspid stenosis. Aortic Valve: The aortic valve is tricuspid. Aortic valve regurgitation is not visualized. No aortic stenosis is present. Aortic valve peak gradient measures 4.4 mmHg. Pulmonic Valve: The pulmonic valve was normal in structure. Pulmonic valve regurgitation is not visualized. No evidence of pulmonic stenosis. Aorta: The aortic root, ascending aorta and aortic arch are all structurally normal, with no evidence of dilitation or obstruction. IAS/Shunts: No atrial level shunt detected by color flow Doppler. Agitated saline contrast was given intravenously to evaluate for intracardiac shunting. Agitated saline contrast bubble study was negative, with no evidence of any interatrial shunt.  LEFT VENTRICLE PLAX 2D LVIDd:         4.40 cm      Diastology LVIDs:         2.50 cm      LV e'  medial:    8.27 cm/s LV PW:         0.90 cm      LV E/e' medial:  9.1 LV IVS:        0.90 cm      LV e' lateral:   12.30 cm/s LVOT diam:     2.10 cm      LV E/e' lateral: 6.1 LV SV:         51 LV SV Index:   22 LVOT Area:     3.46 cm  LV Volumes (MOD) LV vol d, MOD A2C: 112.0 ml LV vol d, MOD A4C: 107.0 ml LV vol s, MOD A2C: 33.5 ml LV vol s, MOD A4C: 30.2 ml LV SV MOD A2C:     78.5 ml LV SV MOD A4C:     107.0 ml LV SV MOD BP:      77.0 ml RIGHT VENTRICLE            IVC RV Basal diam:  2.20 cm    IVC diam: 1.10 cm RV S prime:     7.70 cm/s TAPSE (M-mode): 1.5 cm LEFT ATRIUM             Index        RIGHT ATRIUM           Index LA diam:        4.20 cm 1.82 cm/m   RA Area:     15.00 cm LA Vol (A2C):   56.9 ml 24.71 ml/m  RA Volume:   29.60 ml  12.85 ml/m LA Vol (A4C):   31.3 ml 13.59 ml/m LA Biplane Vol: 42.8 ml 18.59 ml/m  AORTIC VALVE AV Area (Vmax): 2.73 cm AV Vmax:        105.00 cm/s AV Peak Grad:   4.4 mmHg LVOT Vmax:      82.70 cm/s LVOT Vmean:     50.500 cm/s LVOT VTI:       0.147 m  AORTA Ao Root diam: 3.10 cm Ao Asc diam:  3.50 cm MITRAL VALVE MV Area (PHT): 4.10 cm    SHUNTS MV Decel Time: 185 msec     Systemic VTI:  0.15 m MV E velocity: 75.30 cm/s  Systemic Diam: 2.10 cm Riley Lam MD Electronically signed by Riley Lam MD Signature Date/Time: 10/23/2022/12:43:39 PM    Final    MR BRAIN WO CONTRAST  Result Date: 10/23/2022 CLINICAL DATA:  72 year old male code stroke presentation yesterday. Acute left M1 occlusion. Status post endovascular revascularization. Mild subarachnoid hemorrhage suspected postprocedural. EXAM: MRI HEAD WITHOUT CONTRAST TECHNIQUE: Multiplanar, multiecho pulse sequences of the brain and surrounding structures were obtained without intravenous contrast. COMPARISON:  CTA, CT head, and CTP yesterday. No previous brain MRI. FINDINGS: Brain: Confluent restricted diffusion throughout the left basal ganglia (series 5, image 77) and patchy additional scattered cortical and white matter diffusion restriction, mostly in the middle sylvian division. Underlying chronic left MCA territory infarct with encephalomalacia mostly in the posterior division. Cytotoxic edema in the affected areas.Blood products on SWI which seem more related with the chronic encephalomalacia than acutely within subarachnoid spaces. And no IVH is identified. No midline shift. Only mild mass effect on the left lateral ventricle from the left basal ganglia. No posterior fossa diffusion restriction, but there is punctate restricted diffusion in the contralateral right caudate (series 7, image 56). Small chronic right cerebellar infarct. Cervicomedullary junction and pituitary are within normal limits. Vascular: Major intracranial vascular flow voids are  preserved proximally, although the left MCA appears highly asymmetric on both T2 and FLAIR imaging suggesting abnormal flow persists (series 11, image 22). Skull and upper cervical spine: Visualized bone marrow signal is within normal limits. Negative for age visible cervical spine. Sinuses/Orbits: Postoperative changes to the right globe. Paranasal  Visualized paranasal sinuses and mastoids are stable and well aerated. Other: Visible internal auditory structures appear normal. IMPRESSION: 1. Evidence of ongoing abnormal flow in the Left MCA on this routine noncontrast exam. 2. Left MCA territory acute infarcts most confluent in the left basal ganglia. No significant subarachnoid blood by MRI, head CT would be more sensitive. But chronic left MCA territory encephalomalacia with abundant hemosiderin. No significant intracranial mass effect. 3. Superimposed punctate acute infarct in the contralateral right caudate. Electronically Signed   By: Odessa Fleming M.D.   On: 10/23/2022 04:59   CT C-SPINE NO CHARGE  Result Date: 10/22/2022 CLINICAL DATA:  Code stroke EXAM: CT CERVICAL SPINE WITHOUT CONTRAST TECHNIQUE: Multidetector CT imaging of the cervical spine was performed without intravenous contrast. Multiplanar CT image reconstructions were also generated. RADIATION DOSE REDUCTION: This exam was performed according to the departmental dose-optimization program which includes automated exposure control, adjustment of the mA and/or kV according to patient size and/or use of iterative reconstruction technique. COMPARISON:  Cervical spine CT 02/09/2021 FINDINGS: Alignment: Normal. There is no jumped or perched facet or other evidence of traumatic malalignment. Skull base and vertebrae: Skull base alignment is maintained. Vertebral body heights are preserved. There is no evidence of acute fracture. There are bulky anterior osteophytes throughout the cervical spine. Soft tissues and spinal canal: No prevertebral fluid or swelling. No visible canal hematoma. Disc levels: Bulky anterior osteophytes throughout the cervical spine are again seen. 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 Upper chest: The imaged lung apices are clear. Other: None. IMPRESSION: No acute fracture or traumatic  malalignment of the cervical spine Electronically Signed   By: Lesia Hausen M.D.   On: 10/22/2022 11:12   CT ANGIO HEAD NECK W WO CM W PERF (CODE STROKE)  Result Date: 10/22/2022 CLINICAL DATA:  Code stroke EXAM: CT ANGIOGRAPHY HEAD AND NECK CT PERFUSION BRAIN TECHNIQUE: Multidetector CT imaging of the head and neck was performed using the standard protocol during bolus administration of intravenous contrast. Multiplanar CT image reconstructions and MIPs were obtained to evaluate the vascular anatomy. Carotid stenosis measurements (when applicable) are obtained utilizing NASCET criteria, using the distal internal carotid diameter as the denominator. Multiphase CT imaging of the brain was performed following IV bolus contrast injection. Subsequent parametric perfusion maps were calculated using RAPID software. RADIATION DOSE REDUCTION: This exam was performed according to the departmental dose-optimization program which includes automated exposure control, adjustment of the mA and/or kV according to patient size and/or use of iterative reconstruction technique. CONTRAST:  OMNIPAQUE IOHEXOL 350 MG/ML SOLN COMPARISON:  None Available. Same-day noncontrast CT head FINDINGS: CTA NECK FINDINGS Aortic arch: The imaged aortic arch is normal. The origins of the major branch vessels are patent. The subclavian arteries are patent to the level imaged. Right carotid system: The right common, internal, and external carotid arteries are patent, without hemodynamically significant stenosis or occlusion there is no evidence of dissection or aneurysm. Left carotid system: The left common, internal, and external carotid arteries are patent, with minimal plaque at the bifurcation but no hemodynamically significant stenosis or occlusion. There is no evidence of  dissection or aneurysm. Vertebral arteries: The vertebral arteries are patent, without hemodynamically significant stenosis or occlusion there is no evidence of  dissection or aneurysm. Skeleton: The cervical spine is assessed in full on the separately dictated cervical spine CT. Other neck: The soft tissues of the neck are unremarkable. Upper chest: The imaged lung apices are clear. Review of the MIP images confirms the above findings CTA HEAD FINDINGS Anterior circulation: The intracranial ICAs are patent, with mild stenosis of the communicating segment on the right. There is acute occlusion of the distal left M1 segment with some reconstitution of flow in distal branches. The right M1 segment and distal branches are patent The bilateral ACAS are patent, without proximal stenosis or occlusion. The right A1 segment is hypoplastic. The anterior communicating artery is normal. There is no aneurysm or AVM. Posterior circulation: The bilateral V4 segments are patent. The basilar artery is patent. The major cerebellar arteries appear patent. The bilateral PCAs are patent, without proximal stenosis or occlusion. There is a diminutive left posterior communicating artery with a probable infundibular origin. No definite right posterior communicating artery is identified. There is no aneurysm or AVM. Venous sinuses: Patent. Anatomic variants: As above. Review of the MIP images confirms the above findings CT Brain Perfusion Findings: ASPECTS: 9 CBF (<30%) Volume: 0mL Perfusion (Tmax>6.0s) volume: 65mL Mismatch Volume: 65mL Infarction Location:The penumbra is identified in the left MCA distribution. IMPRESSION: 1. Acute left M1 occlusion with some reconstitution of flow in distal branches. 2. 65 cc ischemic penumbra in the MCA distribution. No infarct core identified. 3. No other hemodynamically significant stenosis or occlusion in the vasculature of the head or neck. These results were called by telephone at the time of interpretation on 10/22/2022 at 10:56 am to provider Dr Trilby Leaver, who verbally acknowledged these results. Electronically Signed   By: Lesia Hausen M.D.   On: 10/22/2022  11:08   CT HEAD CODE STROKE WO CONTRAST  Result Date: 10/22/2022 CLINICAL DATA:  Code stroke.  No localizing sign provided EXAM: CT HEAD WITHOUT CONTRAST TECHNIQUE: Contiguous axial images were obtained from the base of the skull through the vertex without intravenous contrast. RADIATION DOSE REDUCTION: This exam was performed according to the departmental dose-optimization program which includes automated exposure control, adjustment of the mA and/or kV according to patient size and/or use of iterative reconstruction technique. COMPARISON:  CT Head 02/09/21 FINDINGS: Brain: Redemonstrated is a chronic left MCA territory infarct involving the insula and anterior temporal lobe. Compared to prior exam there is new hypodensity in the left basal ganglia (series 2, image 20). No evidence of hemorrhage, hydrocephalus, extra-axial collection or mass lesion/mass effect. Vascular: No hyperdense vessel or unexpected calcification. Skull: Normal. Negative for fracture or focal lesion. Sinuses/Orbits: No middle ear effusion. Trace left mastoid effusion. Paranasal sinuses are clear. Right lens replacement and a right scleral band. Orbits are otherwise unremarkable. Other: None. ASPECTS [facility] Stroke Program Early CT Score): 9 when accounting for chronic findings. IMPRESSION: 1. Possible new age indeterminate infarcts in the left basal ganglia. Aspects is 9 when accounting for chronic findings. 2. Chronic left MCA territory infarct. Findings were paged to Dr. Viviann Spare on 10/22/2022 at 10:31 a.m. Electronically Signed   By: Lorenza Cambridge M.D.   On: 10/22/2022 10:31       HISTORY OF PRESENT ILLNESS 72 y.o. 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 At baseline, patient ambulates with a walker.   HOSPITAL COURSE Acute Ischemic Infarct:  left MCA s/p mechanical thrombectomy of left M1 with TICI 2c Etiology: Cardioembolic in the setting of A-fib on Xarelto-unsure about compliance Code Stroke  CT head Possible new age indeterminate infarcts in the left basal ganglia. ASPECTS 9   CTA head & neck Acute left M1 occlusion with some reconstitution of flow in distal branches. CT perfusion  65 cc ischemic penumbra in the MCA distribution. No infarct core identified. Post IR CT mild subarachnoid hemorrhage in the distal left anterior parietal cortical region. MRI  Left MCA territory acute infarcts most confluent in the left basal ganglia. Repeat [facility] 10/25/22: Evolving left MCA territory infarct.  Focal 1.2 x 0.8 cm hyperdense region in the region of the left sylvian fissure could represent contrast pooling or a site of parenchymal hemorrhage.  2D Echo Ef 65-70%.  LDL 92 HgbA1c 5.2 EEG 10/25/22: Suggestive of cortical dysfunction arising from the left hemisphere likely secondary to underlying stroke.  Additionally there is mild to moderate diffuse encephalopathy.  No seizures or epileptiform discharges were seen throughout the recording. VTE prophylaxis - SCDs, mobilize with therapy as tolerated Xarelto (rivaroxaban) daily prior to admission, now on Xarelto (rivaroxaban) daily  Therapy recommendations: CIR Disposition: CIR   Hx of Stroke/TIA With residual aphasia  Stroke in 2013   Atrial fibrillation Home Meds: Xarelto- unsure about compliance  Continue Xarelto   Hypertension Hypertensive emergency Home meds: Amlodipine 5 mg, hydralazine 25 mg twice daily Cleviprex previously required during current hospitalization, gtt off since 10/23/22 Continued home amlodipine 10 mg Blood Pressure Goal: SBP < 180  mmHg Long-term blood pressure goal normotension   Hyperlipidemia Home meds: Crestor 30 mg, increased to 40 LDL 92, goal < 70 Increase to 40 mg Continue statin at discharge   Diabetes type II Controlled Home meds: None HgbA1c 5.2, goal < 7.0 CBGs   Substance Abuse Patient uses cocaine UDS positive for Cocaine      Ready to quit? N/A TOC consult for cessation placed   Dysphagia Patient has post-stroke dysphagia, SLP consulted Diet: dysphagia 2, thin liquids  History of DVT Noted   Other Stroke Risk Factors ETOH use, alcohol level <10, advised to drink no more than 2 drink(s) a day Coronary artery disease Congestive heart failure   Other Active Problems History of renal and prostate cancer-resume home Flomax  SIGNIFICANT HOSPITAL EVENTS 8/9: mechanical thrombectomy of left M1 with TICI 2c 8/10: Cleviprex gtt discontinued 8/12: Some concern for lethargy on exam, STAT CTH obtained revealing evolving left MCA territory infarct.Focal 1.2 x 0.8 cm hyperdense region in the  region of the left Sylvian fissure could represent contrast pooling or a site of parenchymal hemorrhage.  Patient given a 500 cc bolus with improvement in mental status.  DISCHARGE EXAM  PHYSICAL EXAM General:  Alert, well-nourished, well-developed middle-aged African-American male in no acute distress Psych:  Mood and affect appropriate for situation CV: Regular rate and rhythm on monitor Respiratory:  Regular, unlabored respirations on room air GI: Abdomen soft and nontender    NEURO:  Mental Status: AA&Ox3, severe expressive aphasia can follow simple midline commands only.  Can mimic can say one word answers "yes" or "no" intermittently. Speech/Language: speech  is with dysarthria     Cranial Nerves:  II: PERRL.  Decreased blink to threat on the right III, IV, VI: EOMI. Eyelids elevate symmetrically.  V: Sensation is intact to light touch and symmetrical to face.  VII: Right facial droop VIII:  Hearing intact to voice. IX, X: Patient does not allow for participation of the soft palate, does not phonate. ZO:[id number] shrug 5/5. XII: tongue is midline without fasciculations. Motor: Right hemiparesis right arm and right leg 2/5 with increased tone and decreased right grip Tone: is normal and bulk is normal Sensation: Intact to light touch bilaterally. Extinction absent to light touch to DSS.   Coordination: FTN intact bilaterally, HKS: no ataxia in BLE. No drift.  Gait: Deferred  Discharge Diet       Diet   DIET DYS 2 Room service appropriate? No; Fluid consistency: Thin   liquids  DISCHARGE PLAN Disposition: Rehab Xarelto (rivaroxaban) daily for secondary stroke prevention  Ongoing stroke risk factor control by Primary Care Physician at time of discharge Follow-up PCP Clinic, [facility] in 2 weeks. Follow-up in [facility] in 8 weeks, office to schedule an appointment.   35 minutes were spent preparing discharge.  Lanae Boast, AGACNP-BC [facility] Pager: [phone]

## 2022-10-26 NOTE — Progress Notes (Signed)
Speech Language Pathology Treatment: Dysphagia;Cognitive-Linquistic  Patient Details Name: Frank Barron MRN: [id number] DOB: 31-Oct-1950 Today's Date: 10/26/2022 Time: 8657-8469 SLP Time Calculation (min) (ACUTE ONLY): 15 min  Assessment / Plan / Recommendation Clinical Impression  Pt was given advanced trials of regular solids. He had impaired biting to be able to break through the food into smaller pieces, but when given in pieces that were already smaller, his mastication seemed appropriate as did his oral clearance. No overt s/s of aspiration were observed and RN reports that he did well with his breakfast tray. Current diet appears to still be appropriate.   Pt replied sometimes to yes/no questions, but inconsistently and not to open-ended ones. He continues to follow one-step commands better when cues are given within a functional context. Attempted to automatic speech tasks and tried to find music that he might respond to but without increase in verbal output. Recommend intensive SLP f/u to try to maximize communication as well as ongoing efforts to better establish what his baseline was.    HPI HPI: Pt is a 72 yo male presenting 8/9 with acute R sided weakness. CTH concerning for possible new age indeterminate infarcts in the L basal ganglia. Pt not a candidate for TNK but was taken to IR for revascularization. MRI 8/10 confirmed evidence of ongoing abnormal flow in the L MCA, L MCA acute infarcts most confluent in the L basal ganglia, and acute infarct in the contralateral R caudate. PMH includes: stroke (2013, residual aphasia and apraxia but no residual weakness), HTN, anxiety, HLD, depression, h/o DVT, hepatitis C, CAD, afib, prostate ca      SLP Plan  Continue with current plan of care      Recommendations for follow up therapy are one component of a multi-disciplinary discharge planning process, led by the attending physician.  Recommendations may be updated based on patient  status, additional functional criteria and insurance authorization.    Recommendations  Diet recommendations: Dysphagia 2 (fine chop);Thin liquid Liquids provided via: Cup;Straw Medication Administration: Crushed with puree Supervision: Patient able to self feed;Full supervision/cueing for compensatory strategies Compensations: Minimize environmental distractions;Slow rate;Small sips/bites Postural Changes and/or Swallow Maneuvers: Seated upright 90 degrees                  Oral care BID   Frequent or constant Supervision/Assistance Dysphagia, unspecified (R13.10);Aphasia (R47.01)     Continue with current plan of care     Mahala Menghini., M.A. CCC-SLP Acute Rehabilitation Services Office [phone]  Secure chat preferred   10/26/2022, 11:37 AM

## 2022-10-26 NOTE — Progress Notes (Signed)
Inpatient Rehabilitation Admission Medication Review by a Pharmacist  A complete drug regimen review was completed for this patient to identify any potential clinically significant medication issues.  High Risk Drug Classes Is patient taking? Indication by Medication  Antipsychotic No   Anticoagulant Yes Xarelto - Afib   Antibiotic No   Opioid No   Antiplatelet No   Hypoglycemics/insulin No   Vasoactive Medication Yes Amlodipine - HTN Tamsulosin - BPH  Chemotherapy No   Other Yes Trazodone prn sleep Escitalopram - mood Rosuvastatin - HLD     Type of Medication Issue Identified Description of Issue Recommendation(s)  Drug Interaction(s) (clinically significant)     Duplicate Therapy     Allergy     No Medication Administration End Date     Incorrect Dose     Additional Drug Therapy Needed     Significant med changes from prior encounter (inform family/care partners about these prior to discharge). Naltrexone 50 mg daily - PTA medication Resume if and when appropriate  Other       Clinically significant medication issues were identified that warrant physician communication and completion of prescribed/recommended actions by midnight of the next day:  No  Name of provider notified for urgent issues identified:   Provider Method of Notification:     Pharmacist comments: None  Time spent performing this drug regimen review (minutes): 20 minutes  Thank you Okey Regal, PharmD

## 2022-10-26 NOTE — Progress Notes (Signed)
Fanny Dance, MD  Physician Physical Medicine and Rehabilitation   PMR Pre-admission     Signed   Date of Service: 10/26/2022 10:22 AM  Related encounter: ED to Hosp-Admission (Discharged) from 10/22/2022 in [facility]   Signed      PMR Admission Coordinator Pre-Admission Assessment   Patient: Frank Barron is an 72 y.o., male MRN: [id number] DOB: February 16, 1951 Height: 6\' 3"  (190.5 cm) Weight: 101.6 kg                                                                                                                                                  Insurance Information HMO:     PPO:      PCP:      IPA:      80/20:      OTHER:  PRIMARY: [facility]       Policy#: [id number]      Subscriber: pt CM Name: Cassie      Phone#: sent via email     Fax#: [phone] Pre-Cert#: [id number] auth for CIR from Cassie with the [facility] with updates due to fax listed above on 11/25/22     Employer:  Benefits:  Phone #:      Name:  Eff. Date: 08/12/17     Deduct: n/a      Out of Pocket Max: n/a      Life Max: n/a  CIR: 100%      SNF: 100% Outpatient: 100%     Co-Pay:  Home Health: 100%      Co-Pay:  DME: 100%     Co-Pay:  Providers:  SECONDARY: [employer]       Policy#: [id number]       Phone#: [phone]   Financial Counselor:       Phone#:    The "Data Collection Information Summary" for patients in Inpatient Rehabilitation Facilities with attached "Privacy Act Statement-Health Care Records" was provided and verbally reviewed with: Patient and Family   Emergency Contact Information Contact Information       Name Relation Home Work Mobile    Lena Brother [phone]        Edythe Clarity Daughter     [phone]         Other Contacts       Name Relation Home Work Mobile    Kasaan Sister [phone]        Sophana, Monterosso Significant other     [phone]         Current Medical History  Patient Admitting Diagnosis: CVA  History of Present  Illness: Pt is a 72 y/o male with 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  In ED BP 165/133, otherwise  vitals normal, labs notable for creatinine 1.40, albumin 3.2, CT head showed possible new age indeterminate infarcts in the left basal ganglia.  NIH of 17.  CTA showed L MCA occlusion and pt was taken to IR for revascularization.   He did require cleviprex gtt for tight BP control.  Therapy evaluations were completed and pt was recommended for CIR>    Complete NIHSS TOTAL: 16 Glasgow Coma Scale Score: 13   Patient's medical record from [facility] has been reviewed by the rehabilitation admission coordinator and physician.   Past Medical History            Past Medical History:  Diagnosis Date   Anemia     Anxiety     Arthritis     Atrial fibrillation (HCC)     CAD (coronary artery disease)      PCI to LAD 2012   CHF (congestive heart failure) (HCC)     Depression      [name] 07/19/2005 (09/30/2012)   Hepatitis C      pt denies this hx on 09/30/2012   High cholesterol     History of DVT of lower extremity      "left; long time ago" (09/30/2012)   Hypertension     Prostate cancer (HCC)     Renal cell carcinoma (HCC)     Stroke (HCC) 11/18/2011    [name] 07/31/2012; expressive aphasia noted on 09/30/2012            Has the patient had major surgery during 100 days prior to admission? Yes   Family History  family history includes Deep vein thrombosis in his mother; Hypertension in his mother.     Current Medications    Current Medications    Current Facility-Administered Medications:     stroke: early stages of recovery book, , Does not apply, Once, Toberman, Stevi W, NP   [COMPLETED] sodium chloride 0.9 % bolus 500 mL, 500 mL, Intravenous, Once, Last Rate: 250 mL/hr at 10/25/22 1331, 500 mL at 10/25/22 1331 **FOLLOWED  BY** 0.9 %  sodium chloride infusion, , Intravenous, Continuous, Artis Flock, Denise A, NP   acetaminophen (TYLENOL) tablet 650 mg, 650 mg, Oral, Q4H PRN **OR** acetaminophen (TYLENOL) 160 MG/5ML solution 650 mg, 650 mg, Per Tube, Q4H PRN **OR** acetaminophen (TYLENOL) suppository 650 mg, 650 mg, Rectal, Q4H PRN, Cindie Laroche, Stevi W, NP   amLODipine (NORVASC) tablet 10 mg, 10 mg, Oral, Daily, Gevena Mart A, NP, 10 mg at 10/25/22 1059   Chlorhexidine Gluconate Cloth 2 % PADS 6 each, 6 each, Topical, Daily, Marvel Plan, MD, 6 each at 10/24/22 1519   escitalopram (LEXAPRO) tablet 10 mg, 10 mg, Oral, Daily, Gevena Mart A, NP, 10 mg at 10/25/22 1059   hydrALAZINE (APRESOLINE) injection 10-20 mg, 10-20 mg, Intravenous, Q6H PRN, Gevena Mart A, NP   labetalol (NORMODYNE) injection 10-20 mg, 10-20 mg, Intravenous, Q2H PRN, Mathews Argyle, NP   Oral care mouth rinse, 15 mL, Mouth Rinse, PRN, Marvel Plan, MD   Melene Muller ON 10/26/2022] rivaroxaban (XARELTO) tablet 20 mg, 20 mg, Oral, Q breakfast, Gevena Mart A, NP   rosuvastatin (CRESTOR) tablet 40 mg, 40 mg, Oral, Daily, Gevena Mart A, NP, 40 mg at 10/25/22 1059   senna-docusate (Senokot-S) tablet 1 tablet, 1 tablet, Oral, QHS PRN, Kara Mead, NP   tamsulosin (FLOMAX) capsule 0.4 mg, 0.4 mg, Oral, Daily, Gevena Mart A, NP, 0.4 mg at 10/25/22 1059   thiamine (VITAMIN B1) tablet 100 mg, 100 mg, Oral,  Daily, Gevena Mart A, NP, 100 mg at 10/25/22 1059      Patients Current Diet:  Diet Order                  DIET DYS 2 Room service appropriate? No; Fluid consistency: Thin  Diet effective now                         Precautions / Restrictions Precautions Precautions: Fall, Other (comment) Precaution Comments: R hemiplegia, aphasia Restrictions Weight Bearing Restrictions: No    Has the patient had 2 or more falls or a fall with injury in the past year?Yes   Prior Activity Level Limited Community (1-2x/wk): independent prior to admit,  living with spouse who has her own health issues   Prior Functional Level Prior Function Prior Level of Function : Patient poor historian/Family not available   Self Care: Did the patient need help bathing, dressing, using the toilet or eating?  Independent   Indoor Mobility: Did the patient need assistance with walking from room to room (with or without device)? Independent   Stairs: Did the patient need assistance with internal or external stairs (with or without device)? Independent   Functional Cognition: Did the patient need help planning regular tasks such as shopping or remembering to take medications? Independent   Patient Information Are you of Hispanic, Latino/a,or Spanish origin?: X. Patient unable to respond, A. No, not of Hispanic, Latino/a, or Spanish origin (proxy) What is your race?: X. Patient unable to respond, B. Black or African American (proxy) Do you need or want an interpreter to communicate with a doctor or health care staff?: 9. Unable to respond   Patient's Response To:  Health Literacy and Transportation Is the patient able to respond to health literacy and transportation needs?: No Health Literacy - How often do you need to have someone help you when you read instructions, pamphlets, or other written material from your doctor or pharmacy?: Patient unable to respond   Home Assistive Devices / Equipment Home Assistive Devices/Equipment: Blood pressure cuff, CBG Meter, Shower chair with back, Walker (specify type), Cane (specify quad or straight)   Prior Device Use: Indicate devices/aids used by the patient prior to current illness, exacerbation or injury? Walker   Current Functional Level Cognition   Arousal/Alertness: Awake/alert Overall Cognitive Status: Difficult to assess Difficult to assess due to: Impaired communication Orientation Level: Oriented to person, Disoriented to place, Disoriented to time, Disoriented to situation Following Commands:  Follows one step commands inconsistently General Comments: Pt with expressive aphasia, fairly accurate appearing yes/no questions though family not present to confirm PLOF. consistent command following. shows some insight into deficits (shaking head when asked if he could move RUE, indicating being wet in bed) Awareness: Impaired Awareness Impairment: Emergent impairment Problem Solving: Impaired Problem Solving Impairment: Verbal basic    Extremity Assessment (includes Sensation/Coordination)   Upper Extremity Assessment: RUE deficits/detail, Left hand dominant RUE Deficits / Details: unable to move UE on command, some involuntary/automatic movements, when yawning, pt R UE automatically bending and lifting towards face. nods head yes when asked if he could feel RUE RUE Coordination: decreased fine motor, decreased gross motor  Lower Extremity Assessment: Defer to PT evaluation RLE Deficits / Details: Grossly 1/5, able to perform limited heel slide and wiggle toes. Dorsiflexion ROM limited. Increased tone     ADLs   Overall ADL's : Needs assistance/impaired Eating/Feeding: Set up, Sitting, Supervision/ safety Eating/Feeding Details (indicate cue type  and reason): assist to open containers, able to answer yes/no to if pt would like salt/pepper, etc on certain items. minor cues to take smaller bites but pt able to self feed with LUE well Grooming: Minimal assistance, Sitting Upper Body Bathing: Maximal assistance, Sitting Lower Body Bathing: Maximal assistance, +2 for physical assistance, +2 for safety/equipment, Sit to/from stand Upper Body Dressing : Maximal assistance Lower Body Dressing: Maximal assistance, +2 for physical assistance, +2 for safety/equipment, Sit to/from stand Toileting- Clothing Manipulation and Hygiene: Total assistance, Bed level Toileting - Clothing Manipulation Details (indicate cue type and reason): for clean up due to primofit malfunction General ADL Comments: Pt  with R nondominant UE and RLE weakness, questionable vision impairments requiring significant amount of assist to complete ADLs     Mobility   Overal bed mobility: Needs Assistance Bed Mobility: Rolling, Sidelying to Sit Rolling: Min assist Sidelying to sit: Mod assist, +2 for safety/equipment Supine to sit: +2 for physical assistance, Min assist Sit to supine: Min assist, +2 for physical assistance General bed mobility comments: use of bedrail to pull self to L side for cleanup. assisted to EOB from this position w/ assist needed to get BLE off of bed and MinA to lift trunk     Transfers   Overall transfer level: Needs assistance Equipment used: Ambulation equipment used Transfers: Sit to/from Stand, Bed to chair/wheelchair/BSC Sit to Stand: Mod assist, +2 safety/equipment, +2 physical assistance Bed to/from chair/wheelchair/BSC transfer type:: Via Lift equipment Transfer via Lift Equipment: Stedy General transfer comment: Mod A x2 to stand in [place] (increased assist on R weaker side) and transferred to chair with Stedy. Pt able to stand from lower recliner as well with same assist level.     Ambulation / Gait / Stairs / Clinical biochemist / Balance Dynamic Sitting Balance Sitting balance - Comments: Supervision -min guard; increased left lateral lean following transfer Balance Overall balance assessment: Needs assistance Sitting-balance support: Feet supported Sitting balance-Leahy Scale: Fair Sitting balance - Comments: Supervision -min guard; increased left lateral lean following transfer Standing balance support: Single extremity supported, During functional activity Standing balance-Leahy Scale: Poor     Special needs/care consideration Diabetic management yes        Previous Home Environment (from acute therapy documentation) Living Arrangements: Alone Home Care Services: No Additional Comments: pt unable to provide due to aphasia. pt noted w/ wedding  band on- when asked with yes/no questions - pt reports being married for 30 years though no spouse listed in chart   Discharge Living Setting Plans for Discharge Living Setting: Patient's home, Lives with (comment) (spouse) Type of Home at Discharge: Apartment Discharge Home Layout: One level Discharge Home Access: Level entry, Elevator Discharge Bathroom Shower/Tub: Tub/shower unit Discharge Bathroom Toilet: Standard Discharge Bathroom Accessibility: Yes How Accessible: Accessible via walker Does the patient have any problems obtaining your medications?: No   Social/Family/Support Systems Patient Roles: Spouse Anticipated Caregiver: contact is pt's brother, [name] Anticipated Industrial/product designer Information: [phone] Ability/Limitations of Caregiver: family cannot provide 24/7, will rely on assist from [place] to assist with filling in the caregiver gaps, per Helmut Muster [phone] rehab CSW to call when d/c date set so they can set it up. Caregiver Availability: 24/7 Discharge Plan Discussed with Primary Caregiver: Yes Is Caregiver In Agreement with Plan?: Yes Does Caregiver/Family have Issues with Lodging/Transportation while Pt is in Rehab?: No     Goals Patient/Family Goal for Rehab: PT/OT supervision to  min assist, SLP supervision Expected length of stay: 14-18 days Additional Information: Discharge plan: return home with spouse, family and [facility] caregivers Pt/Family Agrees to Admission and willing to participate: Yes Program Orientation Provided & Reviewed with Pt/Caregiver Including Roles  & Responsibilities: Yes Additional Information Needs: CSW to notify Helmut Muster [phone] ext [phone] of CIR d/c date to help set up caregivers  Barriers to Discharge: Decreased caregiver support     Decrease burden of Care through IP rehab admission: n/a     Possible need for SNF placement upon discharge:Not anticipated.  Plan for d/c home with spouse and 24/7 between family and [facility]  support.    Patient Condition: I have reviewed medical records from [facility], spoken with  [facility] team  , and daughter and family member. I discussed via phone for inpatient rehabilitation assessment.  Patient will benefit from ongoing PT, OT, and SLP, can actively participate in 3 hours of therapy a day 5 days of the week, and can make measurable gains during the admission.  Patient will also benefit from the coordinated team approach during an Inpatient Acute Rehabilitation admission.  The patient will receive intensive therapy as well as Rehabilitation physician, nursing, social worker, and care management interventions.  Due to bladder management, bowel management, safety, skin/wound care, disease management, medication administration, pain management, and patient education the patient requires 24 hour a day rehabilitation nursing.  The patient is currently mod to max +2 with mobility and basic ADLs.  Discharge setting and therapy post discharge at home with home health is anticipated.  Patient has agreed to participate in the Acute Inpatient Rehabilitation Program and will admit today.   Preadmission Screen Completed By:  Stephania Fragmin, PT, DPT 10/26/2022 10:22 AM ______________________________________________________________________   Discussed status with Dr. Natale Lay  on 10/26/22  at 10:23 AM  and received approval for admission today.   Admission Coordinator:  Stephania Fragmin, PT, time 10:24 AM Dorna Bloom 10/26/22     Assessment/Plan: Diagnosis: left MCA CVA Does the need for close, 24 hr/day Medical supervision in concert with the patient's rehab needs make it unreasonable for this patient to be served in a less intensive setting? Yes Co-Morbidities requiring supervision/potential complications: Afib, Hx of CVA, HLD, HTN, DMII, substance abuse hx, dysphagia Due to bladder management, bowel management, safety, skin/wound care, disease management, medication administration, pain management, and  patient education, does the patient require 24 hr/day rehab nursing? Yes Does the patient require coordinated care of a physician, rehab nurse, PT, OT, and SLP to address physical and functional deficits in the context of the above medical diagnosis(es)? Yes Addressing deficits in the following areas: balance, endurance, locomotion, strength, transferring, bowel/bladder control, bathing, dressing, feeding, grooming, toileting, cognition, speech, language, swallowing, and psychosocial support Can the patient actively participate in an intensive therapy program of at least 3 hrs of therapy 5 days a week? Yes The potential for patient to make measurable gains while on inpatient rehab is excellent Anticipated functional outcomes upon discharge from inpatient rehab: supervision and min assist PT, supervision and min assist OT, supervision SLP Estimated rehab length of stay to reach the above functional goals is: 14-18 Anticipated discharge destination: Home 10. Overall Rehab/Functional Prognosis: good     MD Signature: Fanny Dance         Revision History

## 2022-10-26 NOTE — TOC Transition Note (Signed)
Transition of Care [facility]) - CM/SW Discharge Note   Patient Details  Name: Frank Barron MRN: [id number] Date of Birth: Feb 02, 1951  [facility] CM/SW Contact:  Kermit Balo, RN Phone Number: 10/26/2022, 1:12 PM   Clinical Narrative:    Pt is discharging to CIR today. CM signing off.    Final next level of care: IP Rehab Facility Barriers to Discharge: No Barriers Identified   Patient Goals and CMS Choice CMS Medicare.gov Compare Post Acute Care list provided to:: Patient Choice offered to / list presented to : Patient  Discharge Placement                         Discharge Plan and Services Additional resources added to the After Visit Summary for     Discharge Planning Services: CM Consult                                 Social Determinants of Health (SDOH) Interventions SDOH Screenings   Food Insecurity: No Food Insecurity (10/25/2022)  Housing: Low Risk  (10/25/2022)  Transportation Needs: No Transportation Needs (10/25/2022)  Utilities: Not At Risk (10/25/2022)  Social Connections: Unknown (07/28/2021)   Received from [facility]  Tobacco Use: Low Risk  (10/25/2022)     Readmission Risk Interventions     No data to display

## 2022-10-26 NOTE — PMR Pre-admission (Signed)
PMR Admission Coordinator Pre-Admission Assessment   Patient: Frank Barron is an 72 y.o., male MRN: [id number] DOB: 1951-02-24 Height: 6\' 3"  (190.5 cm) Weight: 101.6 kg                                                                                                                                                  Insurance Information HMO:     PPO:      PCP:      IPA:      80/20:      OTHER:  PRIMARY: [facility]       Policy#: [id number]      Subscriber: pt CM Name: Cassie      Phone#: sent via email     Fax#: [phone] Pre-Cert#: tbd on admit, updates due weekly (8/19) to fax listed above.     Employer:  Benefits:  Phone #:      Name:  Eff. Date: 08/12/17     Deduct: n/a      Out of Pocket Max: n/a      Life Max: n/a  CIR: 100%      SNF: 100% Outpatient: 100%     Co-Pay:  Home Health: 100%      Co-Pay:  DME: 100%     Co-Pay:  Providers:  SECONDARY: [employer]       Policy#: [id number]       Phone#: [phone]   Financial Counselor:       Phone#:    The "Data Collection Information Summary" for patients in Inpatient Rehabilitation Facilities with attached "Privacy Act Statement-Health Care Records" was provided and verbally reviewed with: Patient and Family   Emergency Contact Information Contact Information       Name Relation Home Work Mobile    [place] Brother [phone]        Edythe Clarity Daughter     [phone]         Other Contacts       Name Relation Home Work Mobile    [place] Sister [phone]        Gad, Emge Significant other     [phone]         Current Medical History  Patient Admitting Diagnosis: CVA  History of Present Illness: Pt is a 72 y/o male with 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  In ED BP 165/133, otherwise vitals normal, labs notable for creatinine  1.40, albumin 3.2, CT head showed possible new age indeterminate infarcts in the left basal ganglia.  NIH of 17.  CTA showed L MCA occlusion and pt was taken to IR for revascularization.   He did require cleviprex gtt for tight BP control.  Therapy evaluations were completed and pt  was recommended for CIR>    Complete NIHSS TOTAL: 16 Glasgow Coma Scale Score: 13   Patient's medical record from [facility] has been reviewed by the rehabilitation admission coordinator and physician.   Past Medical History      Past Medical History:  Diagnosis Date   Anemia     Anxiety     Arthritis     Atrial fibrillation (HCC)     CAD (coronary artery disease)      PCI to LAD 2012   CHF (congestive heart failure) (HCC)     Depression      [name] 07/19/2005 (09/30/2012)   Hepatitis C      pt denies this hx on 09/30/2012   High cholesterol     History of DVT of lower extremity      "left; long time ago" (09/30/2012)   Hypertension     Prostate cancer (HCC)     Renal cell carcinoma (HCC)     Stroke (HCC) 11/18/2011    [name] 07/31/2012; expressive aphasia noted on 09/30/2012          Has the patient had major surgery during 100 days prior to admission? Yes   Family History  family history includes Deep vein thrombosis in his mother; Hypertension in his mother.     Current Medications   Current Medications    Current Facility-Administered Medications:     stroke: early stages of recovery book, , Does not apply, Once, Toberman, Stevi W, NP   [COMPLETED] sodium chloride 0.9 % bolus 500 mL, 500 mL, Intravenous, Once, Last Rate: 250 mL/hr at 10/25/22 1331, 500 mL at 10/25/22 1331 **FOLLOWED BY** 0.9 %  sodium chloride infusion, , Intravenous, Continuous, Artis Flock, Denise A, NP   acetaminophen (TYLENOL) tablet 650 mg, 650 mg, Oral, Q4H PRN **OR** acetaminophen (TYLENOL) 160 MG/5ML solution 650 mg, 650 mg, Per Tube, Q4H PRN **OR** acetaminophen (TYLENOL) suppository 650 mg, 650 mg, Rectal, Q4H PRN, Cindie Laroche,  Stevi W, NP   amLODipine (NORVASC) tablet 10 mg, 10 mg, Oral, Daily, Gevena Mart A, NP, 10 mg at 10/25/22 1059   Chlorhexidine Gluconate Cloth 2 % PADS 6 each, 6 each, Topical, Daily, Marvel Plan, MD, 6 each at 10/24/22 1519   escitalopram (LEXAPRO) tablet 10 mg, 10 mg, Oral, Daily, Gevena Mart A, NP, 10 mg at 10/25/22 1059   hydrALAZINE (APRESOLINE) injection 10-20 mg, 10-20 mg, Intravenous, Q6H PRN, Gevena Mart A, NP   labetalol (NORMODYNE) injection 10-20 mg, 10-20 mg, Intravenous, Q2H PRN, Mathews Argyle, NP   Oral care mouth rinse, 15 mL, Mouth Rinse, PRN, Marvel Plan, MD   Melene Muller ON 10/26/2022] rivaroxaban (XARELTO) tablet 20 mg, 20 mg, Oral, Q breakfast, Gevena Mart A, NP   rosuvastatin (CRESTOR) tablet 40 mg, 40 mg, Oral, Daily, Gevena Mart A, NP, 40 mg at 10/25/22 1059   senna-docusate (Senokot-S) tablet 1 tablet, 1 tablet, Oral, QHS PRN, Kara Mead, NP   tamsulosin (FLOMAX) capsule 0.4 mg, 0.4 mg, Oral, Daily, Gevena Mart A, NP, 0.4 mg at 10/25/22 1059   thiamine (VITAMIN B1) tablet 100 mg, 100 mg, Oral, Daily, Gevena Mart A, NP, 100 mg at 10/25/22 1059     Patients Current Diet:  Diet Order                  DIET DYS 2 Room service appropriate? No; Fluid consistency: Thin  Diet effective now  Precautions / Restrictions Precautions Precautions: Fall, Other (comment) Precaution Comments: R hemiplegia, aphasia Restrictions Weight Bearing Restrictions: No    Has the patient had 2 or more falls or a fall with injury in the past year?Yes   Prior Activity Level Limited Community (1-2x/wk): independent prior to admit, living with spouse who has her own health issues   Prior Functional Level Prior Function Prior Level of Function : Patient poor historian/Family not available   Self Care: Did the patient need help bathing, dressing, using the toilet or eating?  Independent   Indoor Mobility: Did the patient need assistance with  walking from room to room (with or without device)? Independent   Stairs: Did the patient need assistance with internal or external stairs (with or without device)? Independent   Functional Cognition: Did the patient need help planning regular tasks such as shopping or remembering to take medications? Independent   Patient Information Are you of Hispanic, Latino/a,or Spanish origin?: X. Patient unable to respond, A. No, not of Hispanic, Latino/a, or Spanish origin (proxy) What is your race?: X. Patient unable to respond, B. Black or African American (proxy) Do you need or want an interpreter to communicate with a doctor or health care staff?: 9. Unable to respond   Patient's Response To:  Health Literacy and Transportation Is the patient able to respond to health literacy and transportation needs?: No Health Literacy - How often do you need to have someone help you when you read instructions, pamphlets, or other written material from your doctor or pharmacy?: Patient unable to respond   Home Assistive Devices / Equipment Home Assistive Devices/Equipment: Blood pressure cuff, CBG Meter, Shower chair with back, Walker (specify type), Cane (specify quad or straight)   Prior Device Use: Indicate devices/aids used by the patient prior to current illness, exacerbation or injury? Walker   Current Functional Level Cognition   Arousal/Alertness: Awake/alert Overall Cognitive Status: Difficult to assess Difficult to assess due to: Impaired communication Orientation Level: Oriented to person, Disoriented to place, Disoriented to time, Disoriented to situation Following Commands: Follows one step commands inconsistently General Comments: Pt with expressive aphasia, fairly accurate appearing yes/no questions though family not present to confirm PLOF. consistent command following. shows some insight into deficits (shaking head when asked if he could move RUE, indicating being wet in bed) Awareness:  Impaired Awareness Impairment: Emergent impairment Problem Solving: Impaired Problem Solving Impairment: Verbal basic    Extremity Assessment (includes Sensation/Coordination)   Upper Extremity Assessment: RUE deficits/detail, Left hand dominant RUE Deficits / Details: unable to move UE on command, some involuntary/automatic movements, when yawning, pt R UE automatically bending and lifting towards face. nods head yes when asked if he could feel RUE RUE Coordination: decreased fine motor, decreased gross motor  Lower Extremity Assessment: Defer to PT evaluation RLE Deficits / Details: Grossly 1/5, able to perform limited heel slide and wiggle toes. Dorsiflexion ROM limited. Increased tone     ADLs   Overall ADL's : Needs assistance/impaired Eating/Feeding: Set up, Sitting, Supervision/ safety Eating/Feeding Details (indicate cue type and reason): assist to open containers, able to answer yes/no to if pt would like salt/pepper, etc on certain items. minor cues to take smaller bites but pt able to self feed with LUE well Grooming: Minimal assistance, Sitting Upper Body Bathing: Maximal assistance, Sitting Lower Body Bathing: Maximal assistance, +2 for physical assistance, +2 for safety/equipment, Sit to/from stand Upper Body Dressing : Maximal assistance Lower Body Dressing: Maximal assistance, +2 for physical assistance, +2  for safety/equipment, Sit to/from stand Toileting- Architect and Hygiene: Total assistance, Bed level Toileting - Clothing Manipulation Details (indicate cue type and reason): for clean up due to primofit malfunction General ADL Comments: Pt with R nondominant UE and RLE weakness, questionable vision impairments requiring significant amount of assist to complete ADLs     Mobility   Overal bed mobility: Needs Assistance Bed Mobility: Rolling, Sidelying to Sit Rolling: Min assist Sidelying to sit: Mod assist, +2 for safety/equipment Supine to sit: +2 for  physical assistance, Min assist Sit to supine: Min assist, +2 for physical assistance General bed mobility comments: use of bedrail to pull self to L side for cleanup. assisted to EOB from this position w/ assist needed to get BLE off of bed and MinA to lift trunk     Transfers   Overall transfer level: Needs assistance Equipment used: Ambulation equipment used Transfers: Sit to/from Stand, Bed to chair/wheelchair/BSC Sit to Stand: Mod assist, +2 safety/equipment, +2 physical assistance Bed to/from chair/wheelchair/BSC transfer type:: Via Lift equipment Transfer via Lift Equipment: Stedy General transfer comment: Mod A x2 to stand in [place] (increased assist on R weaker side) and transferred to chair with Stedy. Pt able to stand from lower recliner as well with same assist level.     Ambulation / Gait / Stairs / Clinical biochemist / Balance Dynamic Sitting Balance Sitting balance - Comments: Supervision -min guard; increased left lateral lean following transfer Balance Overall balance assessment: Needs assistance Sitting-balance support: Feet supported Sitting balance-Leahy Scale: Fair Sitting balance - Comments: Supervision -min guard; increased left lateral lean following transfer Standing balance support: Single extremity supported, During functional activity Standing balance-Leahy Scale: Poor     Special needs/care consideration Diabetic management yes        Previous Home Environment (from acute therapy documentation) Living Arrangements: Alone Home Care Services: No Additional Comments: pt unable to provide due to aphasia. pt noted w/ wedding band on- when asked with yes/no questions - pt reports being married for 30 years though no spouse listed in chart   Discharge Living Setting Plans for Discharge Living Setting: Patient's home, Lives with (comment) (spouse) Type of Home at Discharge: Apartment Discharge Home Layout: One level Discharge Home Access:  Level entry, Elevator Discharge Bathroom Shower/Tub: Tub/shower unit Discharge Bathroom Toilet: Standard Discharge Bathroom Accessibility: Yes How Accessible: Accessible via walker Does the patient have any problems obtaining your medications?: No   Social/Family/Support Systems Patient Roles: Spouse Anticipated Caregiver: contact is pt's brother, [name] Anticipated Industrial/product designer Information: [phone] Ability/Limitations of Caregiver: family cannot provide 24/7, will rely on assist from [place] to assist with filling in the caregiver gaps, per Helmut Muster [phone] ext [phone] rehab CSW to call when d/c date set so they can set it up. Caregiver Availability: 24/7 Discharge Plan Discussed with Primary Caregiver: Yes Is Caregiver In Agreement with Plan?: Yes Does Caregiver/Family have Issues with Lodging/Transportation while Pt is in Rehab?: No     Goals Patient/Family Goal for Rehab: PT/OT supervision to min assist, SLP supervision Expected length of stay: 14-18 days Additional Information: Discharge plan: return home with spouse, family and [facility] caregivers Pt/Family Agrees to Admission and willing to participate: Yes Program Orientation Provided & Reviewed with Pt/Caregiver Including Roles  & Responsibilities: Yes Additional Information Needs: CSW to notify Helmut Muster [phone] ext [phone] of CIR d/c date to help set up caregivers  Barriers to Discharge: Decreased caregiver support     Decrease burden  of Care through IP rehab admission: n/a     Possible need for SNF placement upon discharge:Not anticipated.  Plan for d/c home with spouse and 24/7 between family and [facility] support.   Patient Condition: I have reviewed medical records from [facility], spoken with  [facility] team  , and daughter and family member. I discussed via phone for inpatient rehabilitation assessment.  Patient will benefit from ongoing PT, OT, and SLP, can actively participate in 3 hours of therapy a day 5 days of the  week, and can make measurable gains during the admission.  Patient will also benefit from the coordinated team approach during an Inpatient Acute Rehabilitation admission.  The patient will receive intensive therapy as well as Rehabilitation physician, nursing, social worker, and care management interventions.  Due to bladder management, bowel management, safety, skin/wound care, disease management, medication administration, pain management, and patient education the patient requires 24 hour a day rehabilitation nursing.  The patient is currently mod to max +2 with mobility and basic ADLs.  Discharge setting and therapy post discharge at home with home health is anticipated.  Patient has agreed to participate in the Acute Inpatient Rehabilitation Program and will admit today.  Preadmission Screen Completed By:  Stephania Fragmin, PT, DPT 10/26/2022 10:22 AM ______________________________________________________________________   Discussed status with Dr. Natale Lay  on 10/26/22  at 10:23 AM  and received approval for admission today.  Admission Coordinator:  Stephania Fragmin, PT, time 10:24 AM Dorna Bloom 10/26/22    Assessment/Plan: Diagnosis: left MCA CVA Does the need for close, 24 hr/day Medical supervision in concert with the patient's rehab needs make it unreasonable for this patient to be served in a less intensive setting? Yes Co-Morbidities requiring supervision/potential complications: Afib, Hx of CVA, HLD, HTN, DMII, substance abuse hx, dysphagia Due to bladder management, bowel management, safety, skin/wound care, disease management, medication administration, pain management, and patient education, does the patient require 24 hr/day rehab nursing? Yes Does the patient require coordinated care of a physician, rehab nurse, PT, OT, and SLP to address physical and functional deficits in the context of the above medical diagnosis(es)? Yes Addressing deficits in the following areas: balance,  endurance, locomotion, strength, transferring, bowel/bladder control, bathing, dressing, feeding, grooming, toileting, cognition, speech, language, swallowing, and psychosocial support Can the patient actively participate in an intensive therapy program of at least 3 hrs of therapy 5 days a week? Yes The potential for patient to make measurable gains while on inpatient rehab is excellent Anticipated functional outcomes upon discharge from inpatient rehab: supervision and min assist PT, supervision and min assist OT, supervision SLP Estimated rehab length of stay to reach the above functional goals is: 14-18 Anticipated discharge destination: Home 10. Overall Rehab/Functional Prognosis: good   MD Signature: Fanny Dance

## 2022-10-26 NOTE — Plan of Care (Signed)
  Problem: Education: Goal: Knowledge of disease or condition will improve Outcome: Progressing Goal: Knowledge of secondary prevention will improve (MUST DOCUMENT ALL) Outcome: Progressing Goal: Knowledge of patient specific risk factors will improve Loraine Leriche N/A or DELETE if not current risk factor) Outcome: Progressing   Problem: Ischemic Stroke/TIA Tissue Perfusion: Goal: Complications of ischemic stroke/TIA will be minimized Outcome: Progressing   Problem: Coping: Goal: Will verbalize positive feelings about self Outcome: Progressing   Problem: Self-Care: Goal: Ability to participate in self-care as condition permits will improve Outcome: Progressing Goal: Verbalization of feelings and concerns over difficulty with self-care will improve Outcome: Progressing Goal: Ability to communicate needs accurately will improve Outcome: Progressing   Problem: Education: Goal: Understanding of CV disease, CV risk reduction, and recovery process will improve Outcome: Progressing Goal: Individualized Educational Video(s) Outcome: Progressing   Problem: Activity: Goal: Ability to return to baseline activity level will improve Outcome: Progressing

## 2022-10-26 NOTE — Progress Notes (Signed)
Orthopedic Tech Progress Note Patient Details:  Frank Barron 1950/12/29 [id number]  Called in order to [facility] for a REHAB COMBO   Patient ID: Frank Barron, male   DOB: [birth year], 72 y.o.   MRN: [id number]  [name] 10/26/2022, 5:16 PM

## 2022-10-27 DIAGNOSIS — I63512 Cerebral infarction due to unspecified occlusion or stenosis of left middle cerebral artery: Secondary | ICD-10-CM | POA: Diagnosis not present

## 2022-10-27 MED ORDER — RIVAROXABAN 20 MG PO TABS
20.0000 mg | ORAL_TABLET | Freq: Every day | ORAL | Status: DC
  Administered 2022-10-27 – 2022-11-10 (×15): 20 mg via ORAL
  Filled 2022-10-27 (×15): qty 1

## 2022-10-27 NOTE — Plan of Care (Signed)
  Problem: RH Balance Goal: LTG Patient will maintain dynamic sitting balance (PT) Description: LTG:  Patient will maintain dynamic sitting balance with assistance during mobility activities (PT) Flowsheets (Taken 10/27/2022 1608) LTG: Pt will maintain dynamic sitting balance during mobility activities with:: Supervision/Verbal cueing Goal: LTG Patient will maintain dynamic standing balance (PT) Description: LTG:  Patient will maintain dynamic standing balance with assistance during mobility activities (PT) Flowsheets (Taken 10/27/2022 1608) LTG: Pt will maintain dynamic standing balance during mobility activities with:: Minimal Assistance - Patient > 75%   Problem: Sit to Stand Goal: LTG:  Patient will perform sit to stand with assistance level (PT) Description: LTG:  Patient will perform sit to stand with assistance level (PT) Flowsheets (Taken 10/27/2022 1608) LTG: PT will perform sit to stand in preparation for functional mobility with assistance level: Minimal Assistance - Patient > 75%   Problem: RH Bed Mobility Goal: LTG Patient will perform bed mobility with assist (PT) Description: LTG: Patient will perform bed mobility with assistance, with/without cues (PT). Flowsheets (Taken 10/27/2022 1608) LTG: Pt will perform bed mobility with assistance level of: Supervision/Verbal cueing   Problem: RH Bed to Chair Transfers Goal: LTG Patient will perform bed/chair transfers w/assist (PT) Description: LTG: Patient will perform bed to chair transfers with assistance (PT). Flowsheets (Taken 10/27/2022 1608) LTG: Pt will perform Bed to Chair Transfers with assistance level: Minimal Assistance - Patient > 75%   Problem: RH Car Transfers Goal: LTG Patient will perform car transfers with assist (PT) Description: LTG: Patient will perform car transfers with assistance (PT). Flowsheets (Taken 10/27/2022 1608) LTG: Pt will perform car transfers with assist:: Minimal Assistance - Patient > 75%    Problem: RH Ambulation Goal: LTG Patient will ambulate in controlled environment (PT) Description: LTG: Patient will ambulate in a controlled environment, # of feet with assistance (PT). Flowsheets (Taken 10/27/2022 1608) LTG: Pt will ambulate in controlled environ  assist needed:: Minimal Assistance - Patient > 75% LTG: Ambulation distance in controlled environment: 43' Note: With LRAD   Problem: RH Wheelchair Mobility Goal: LTG Patient will propel w/c in controlled environment (PT) Description: LTG: Patient will propel wheelchair in controlled environment, # of feet with assist (PT) Flowsheets (Taken 10/27/2022 1608) LTG: Pt will propel w/c in controlled environ  assist needed:: Independent with assistive device LTG: Propel w/c distance in controlled environment: 150'   Problem: RH Stairs Goal: LTG Patient will ambulate up and down stairs w/assist (PT) Description: LTG: Patient will ambulate up and down # of stairs with assistance (PT) Flowsheets (Taken 10/27/2022 1608) LTG: Pt will ambulate up/down stairs assist needed:: Moderate Assistance - Patient 50 - 74% LTG: Pt will  ambulate up and down number of stairs: 1

## 2022-10-27 NOTE — Plan of Care (Signed)
  Problem: RH Swallowing Goal: LTG Patient will consume least restrictive diet using compensatory strategies with assistance (SLP) Description: LTG:  Patient will consume least restrictive diet using compensatory strategies with assistance (SLP) Flowsheets (Taken 10/27/2022 1226) LTG: Pt Patient will consume least restrictive diet using compensatory strategies with assistance of (SLP): Minimal Assistance - Patient > 75%   Problem: RH Expression Communication Goal: LTG Patient will express needs/wants via multi-modal(SLP) Description: LTG:  Patient will express needs/wants via multi-modal communication (gestures/written, etc) with cues (SLP) Flowsheets (Taken 10/27/2022 1226) LTG: Patient will express needs/wants via multimodal communication (gestures/written, etc) with cueing (SLP): Moderate Assistance - Patient 50 - 74% Goal: LTG Patient will increase speech intelligibility (SLP) Description: LTG: Patient will increase speech intelligibility at word/phrase/conversation level with cues, % of the time (SLP) 10/27/2022 1241 by ,  F, CCC-SLP Flowsheets (Taken 10/27/2022 1241) LTG: Patient will increase speech intelligibility (SLP): Moderate Assistance - Patient 50 - 74% Level: Word Percent of time patient will use intelligible speech: 50 10/27/2022 1226 by [employer],  F, CCC-SLP Flowsheets (Taken 10/27/2022 1226) LTG: Patient will increase speech intelligibility (SLP): Moderate Assistance - Patient 50 - 74% Level: Word Percent of time patient will use intelligible speech: 80 Goal: LTG Patient will increase word finding of common (SLP) Description: LTG:  Patient will increase word finding of common objects/daily info/abstract thoughts with cues using compensatory strategies (SLP). Flowsheets (Taken 10/27/2022 1226) LTG: Patient will increase word finding of common (SLP): Moderate Assistance - Patient 50 - 74% Patient will use compensatory strategies to increase word finding of:   Common objects  Daily info   Problem: RH Attention Goal: LTG Patient will demonstrate this level of attention during functional activites (SLP) Description: LTG:  Patient will will demonstrate this level of attention during functional activites (SLP) Flowsheets (Taken 10/27/2022 1226) Patient will demonstrate during cognitive/linguistic activities the attention type of: Sustained LTG: Patient will demonstrate this level of attention during cognitive/linguistic activities with assistance of (SLP): Moderate Assistance - Patient 50 - 74% Number of minutes patient will demonstrate attention during cognitive/linguistic activities: 15

## 2022-10-27 NOTE — Evaluation (Signed)
Physical Therapy Assessment and Plan  Patient Details  Name: Frank Barron MRN: [id number] Date of Birth: November 03, 1950  PT Diagnosis: Abnormality of gait, Cognitive deficits, Hemiparesis dominant, Hypertonia, Impaired sensation, and Muscle weakness Rehab Potential: Good ELOS: 3-4 weeks   Today's Date: 10/27/2022 PT Individual Time: 4401-0272 PT Individual Time Calculation (min): 71 min    Hospital Problem: Principal Problem:   Acute ischemic left middle cerebral artery (MCA) stroke (HCC) Active Problems:   Acute renal failure superimposed on chronic kidney disease (HCC)   Hyponatremia   Past Medical History:  Past Medical History:  Diagnosis Date   Anemia    Anxiety    Arthritis    Atrial fibrillation (HCC)    CAD (coronary artery disease)    PCI to LAD 2012   CHF (congestive heart failure) (HCC)    Chronic back pain    Depression    [name] 07/19/2005 (09/30/2012)   H/O ETOH abuse    Hepatitis C    pt denies this hx on 09/30/2012   High cholesterol    History of DVT of lower extremity    "left; long time ago" (09/30/2012)   Hypertension    OA (osteoarthritis) of hip    moderate to severe Right hip   Prostate cancer (HCC)    Renal cell carcinoma (HCC)    Stroke (HCC) 11/18/2011   [name] 07/31/2012; expressive aphasia noted on 09/30/2012   Past Surgical History:  Past Surgical History:  Procedure Laterality Date   COLONOSCOPY N/A 10/01/2012   Procedure: COLONOSCOPY;  Surgeon: Louis Meckel, MD;  Location: [facility] ENDOSCOPY;  Service: Endoscopy;  Laterality: N/A;   CORONARY ANGIOPLASTY WITH STENT PLACEMENT     ESOPHAGOGASTRODUODENOSCOPY N/A 10/01/2012   Procedure: ESOPHAGOGASTRODUODENOSCOPY (EGD);  Surgeon: Louis Meckel, MD;  Location: [facility] ENDOSCOPY;  Service: Endoscopy;  Laterality: N/A;   EYE SURGERY Right 1975   /notes 07/19/2005; "injured playing football" (09/29/2012)   GIVENS CAPSULE STUDY N/A 10/02/2012   Procedure: GIVENS CAPSULE STUDY;  Surgeon: Louis Meckel, MD;   Location: [facility] ENDOSCOPY;  Service: Endoscopy;  Laterality: N/A;   IR ANGIO VERTEBRAL SEL SUBCLAVIAN INNOMINATE UNI L MOD SED  10/22/2022   IR CT HEAD LTD  10/22/2022   IR CT HEAD LTD  10/22/2022   IR PERCUTANEOUS ART THROMBECTOMY/INFUSION INTRACRANIAL INC DIAG ANGIO  10/22/2022   JOINT REPLACEMENT  01/03/2007   LUMBAR DISC SURGERY  2001   herniated disc/notes 07/19/2005 (09/30/2012)   NEPHRECTOMY  12/26/2013   Laparascopic , [facility]   RADIOLOGY WITH ANESTHESIA N/A 10/22/2022   Procedure: IR WITH ANESTHESIA;  Surgeon: Radiologist, Medication, MD;  Location: [facility] OR;  Service: Radiology;  Laterality: N/A;   TOTAL HIP ARTHROPLASTY Left 01/03/2007   [name] 01/03/2007 (09/30/2012)   TOTAL KNEE ARTHROPLASTY Left 09/12/2014   Procedure: LEFT TOTAL KNEE ARTHROPLASTY;  Surgeon: Tarry Kos, MD;  Location: [facility] OR;  Service: Orthopedics;  Laterality: Left;    Assessment & Plan Clinical Impression: Patient is a 72 year old male with history of A fib on Xarelto, CAD s/p PCI, CHF, DVT, HTN, Lumbar spondylosis/chronic pain, OA right hip,  RCC, prostate CA, 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 He was noted to have aphasia with dysarthria and right sided weakness. UDS positive for cocaine CT head showed possible new indeterminate infarct in left basal ganglia and CTA head/neck showed acute L M1 occlusion with 65 cc  ischemic penumbra without core infarct.  He underwent cerebral angio with TICI 2 revascularization of distal L-MCA M1 segment wit post procedure CT head showing mild L-SAH in distal parietal cortical region. MRI brain done showing new completed L-BG stroke with  with abnormal FLAIR signal in proximal L-MCA s/o slow flow v/s occlusion and was not a candidate for repeat revascularization due to risk of hemorrhagic transformation.    2D echo showed EF 65-70% and no wall abnormality.  He was started on Lexapro  for mood stabilized. On 08/12 pm, he was found to be lethargic, non verbal and transient left sided weakness. RR evalauted patient and patient back to baseline at end of event. Repeat CT head on 08/12 showed evolving L-MCA infarct with 1.2 cm X 0.8 cm hyperdense region. Question contrast pooling v/s site of IPH--short term follow up recommended and diffuse hyperdense appearence of L-MCA involving M1 worrisome for thrombus.  He was treated with 500 cc fluid bolus with some improvement in mental status. EEG done showing  mild to moderate diffuse encephalopathy without seizures. On D2, thin with supervision for safety.    Dr. Pearlean Brownie felt that stroke cardioembolic in setting of A fib on Xarelto but question of compliance. Therapy has been working with patient who continues to be limited by dysphagia, ahpasia with intermittent following one step commands and answering Y/N question inconsistently, question of right inattention with ADL task and requires mod to max assist with ADL tasks and standing attempt. CIR recommended due to functional decline.   At baseline, patient is independent with ADLs and was able to ambulate without AD per brother. He managed home/cooked as his wife had renal transplant a few weeks ago., his sister was helping them out.  Patient currently requires max with mobility secondary to muscle weakness, decreased cardiorespiratoy endurance, impaired timing and sequencing, abnormal tone, unbalanced muscle activation, decreased coordination, and decreased motor planning, decreased midline orientation and decreased attention to right, decreased awareness and delayed processing, and decreased sitting balance, decreased standing balance, decreased postural control, hemiplegia, and decreased balance strategies.  Prior to hospitalization, patient was independent  with mobility and lived with Spouse (per chart) in a   home.  Will need to confirm with family about home set up as pt unable to provide  background information with aphasia.  Patient will benefit from skilled PT intervention to maximize safe functional mobility, minimize fall risk, and decrease caregiver burden for planned discharge home with 24 hour assist.  Anticipate patient will benefit from follow up [facility] at discharge.  PT - End of Session Activity Tolerance: Tolerates 30+ min activity with multiple rests Endurance Deficit: Yes Endurance Deficit Description: generalized weakness, requires rest breaks with mobility PT Assessment Rehab Potential (ACUTE/IP ONLY): Good PT Barriers to Discharge: Decreased caregiver support PT Barriers to Discharge Comments: family support? unsure of accessibility of home PT Patient demonstrates impairments in the following area(s): Balance;Sensory;Endurance;Motor;Pain;Safety;Perception PT Transfers Functional Problem(s): Bed Mobility;Bed to Chair;Car PT Locomotion Functional Problem(s): Ambulation;Wheelchair Mobility;Stairs PT Plan PT Intensity: Minimum of 1-2 x/day ,45 to 90 minutes PT Frequency: 5 out of 7 days PT Duration Estimated Length of Stay: 3-4 weeks PT Treatment/Interventions: Ambulation/gait training;DME/adaptive equipment instruction;Stair training;UE/LE Strength taining/ROM;Wheelchair propulsion/positioning;Neuromuscular re-education;Community reintegration;Balance/vestibular training;Discharge planning;Functional electrical stimulation;Pain management;Therapeutic Activities;UE/LE Coordination activities;Cognitive remediation/compensation;Disease management/prevention;Functional mobility training;Patient/family education;Splinting/orthotics;Therapeutic Exercise;Visual/perceptual remediation/compensation PT Transfers Anticipated Outcome(s): min assist PT Locomotion Anticipated Outcome(s): min assist ambulation, modI WC mobility PT Recommendation Follow Up Recommendations: Home health PT Patient destination: Home Equipment Recommended: To be determined Equipment Details:  TBD based  on pt progress   PT Evaluation Precautions/Restrictions Precautions Precautions: Fall Precaution Comments: R hemiplegia, aphasia (inconsistent yes/no) Restrictions Weight Bearing Restrictions: No General Chart Reviewed: Yes  Pain Interference Pain Interference Pain Effect on Sleep: 8. Unable to answer Pain Interference with Therapy Activities: 8. Unable to answer Pain Interference with Day-to-Day Activities: 8. Unable to answer Home Living/Prior Functioning Home Living Additional Comments: pt unable to provide due to aphasia, above information obtained from chart review  Lives With: Spouse (per chart) Prior Function Level of Independence: Independent with basic ADLs;Independent with homemaking with ambulation;Independent with transfers (per chart review) Cognition Overall Cognitive Status: Difficult to assess Arousal/Alertness: Awake/alert Orientation Level:  (difficult to assess with aphasia) Awareness: Impaired Awareness Impairment: Intellectual impairment Problem Solving: Impaired Safety/Judgment: Impaired Comments: Very difficult to assess 2/2 aphasia Sensation Sensation Light Touch: Impaired by gross assessment Additional Comments: Sensation impaired in the RUE/RLE, difficult to assess with aphasia Coordination Gross Motor Movements are Fluid and Coordinated: No Fine Motor Movements are Fluid and Coordinated: No Coordination and Movement Description: Dense R hemiplegia Motor  Motor Motor: Hemiplegia Motor - Skilled Clinical Observations: R hemiplegia  Trunk/Postural Assessment  Postural Control Postural Control: Deficits on evaluation Righting Reactions: delayed Protective Responses: delayed  Balance Static Sitting Balance Static Sitting - Balance Support: Feet supported;Bilateral upper extremity supported Static Sitting - Level of Assistance: 5: Stand by assistance (CGA) Static Standing Balance Static Standing - Balance Support: Left upper extremity  supported Static Standing - Level of Assistance: 2: Max assist Dynamic Standing Balance Dynamic Standing - Balance Support: Left upper extremity supported Dynamic Standing - Level of Assistance: 1: +1 Total assist Extremity Assessment  RLE Assessment RLE Assessment: Exceptions to [facility] General Strength Comments: trace activation noted with functional movements but significantly limited by hypertonia RLE Tone RLE Tone: Severe;Hypertonic;Modified Ashworth Body Part - Modified Ashworth Scale: Quadriceps QUADRICEPS - Modified Ashworth Scale for Grading Hypertonia RLE: Considerable increase in muschle tone, passive movement difficult LLE Assessment LLE Assessment: Exceptions to [facility] General Strength Comments: grossly 4/5 LLE  Care Tool Care Tool Bed Mobility Roll left and right activity   Roll left and right assist level: Moderate Assistance - Patient 50 - 74%    Sit to lying activity   Sit to lying assist level: Moderate Assistance - Patient 50 - 74%    Lying to sitting on side of bed activity   Lying to sitting on side of bed assist level: the ability to move from lying on the back to sitting on the side of the bed with no back support.: Moderate Assistance - Patient 50 - 74%     Care Tool Transfers Sit to stand transfer   Sit to stand assist level: Maximal Assistance - Patient 25 - 49%    Chair/bed transfer   Chair/bed transfer assist level: Dependent - Control and instrumentation engineer transfer activity did not occur: Safety/medical concerns        Care Tool Locomotion Ambulation Ambulation activity did not occur: Safety/medical concerns        Walk 10 feet activity Walk 10 feet activity did not occur: Safety/medical concerns       Walk 50 feet with 2 turns activity Walk 50 feet with 2 turns activity did not occur: Safety/medical concerns      Walk 150 feet activity Walk 150 feet activity did not occur: Safety/medical concerns      Walk  10  feet on uneven surfaces activity Walk 10 feet on uneven surfaces activity did not occur: Safety/medical concerns      Stairs Stair activity did not occur: Safety/medical concerns        Walk up/down 1 step activity Walk up/down 1 step or curb (drop down) activity did not occur: Safety/medical concerns      Walk up/down 4 steps activity Walk up/down 4 steps activity did not occur: Safety/medical concerns      Walk up/down 12 steps activity Walk up/down 12 steps activity did not occur: Safety/medical concerns      Pick up small objects from floor Pick up small object from the floor (from standing position) activity did not occur: Safety/medical concerns      Wheelchair Is the patient using a wheelchair?: Yes Type of Wheelchair: Manual   Wheelchair assist level: Dependent - Patient 0% (pt unable to assist due to strength and tonal deficits)    Wheel 50 feet with 2 turns activity   Assist Level: Dependent - Patient 0%  Wheel 150 feet activity   Assist Level: Dependent - Patient 0%    Refer to Care Plan for Long Term Goals  SHORT TERM GOAL WEEK 1 PT Short Term Goal 1 (Week 1): Pt will complete supine to sit EOB min assist PT Short Term Goal 2 (Week 1): Pt will complete sit<>stand mod assist PT Short Term Goal 3 (Week 1): Pt will complete bed<>WC transfer with mod assist and LRAD PT Short Term Goal 4 (Week 1): Pt will initiate WC mobility training  Recommendations for other services: None   Skilled Therapeutic Intervention Evaluation completed (see details above and below) with education on PT POC and goals and individual treatment initiated with focus on transfer training, WC fit, and tone management. Pt unable to report pain secondary to aphasia, does not seem to be in distress however does demonstrate some pain avoidant behaviors with knee extension following prolonged knee flexion on RLE. Therapist attempts during session to find [name] however none available, therapist  provides elevating leg rests for pt due to extensor tone RLE. Pt transported to main gym and positioned in //bars. Pt attempt x2 to complete sit<>stand in //bars however unable due to RLE extensor tone and insufficient muscle fiber recruitment for gluteal clearance despite max assist. Pt attempt to reposition in [facility] for scooting however decreased initiation requires max assist. Therapist provides stretching to RLE for knee flexion/DF via heel slides on floor to decrease RLE extensor tone, improved for short period of time to position on leg rest. Pt transported back to room and completes stand to stedy max assist for initiation and initial gluteal clearance, pt with increased assist once elevated. Pt then completes x4 stands from propped position in stedy, positioned in front of mirror to assist with postural corrections for midline orientation with pt demonstrating significant R lateral lean. Pt transferred back to bed via stedy with max assist and transitioned to supine mod assist. Pt remains supine with all needs within reach, call light in place, and bed alarm activated at end of session.  Mobility Bed Mobility Bed Mobility: Sit to Supine Sit to Supine: Moderate Assistance - Patient 50-74% Transfers Transfers: Stand to Sit;Sit to [employer] via [employer] to Stand: Maximal Assistance - Patient 25-49% Stand to Sit: Maximal Assistance - Patient 25-49% Transfer via Lift Equipment: Youth worker Ambulation: No Gait Gait: No Stairs / Additional Locomotion Stairs: No Wheelchair Mobility Wheelchair Mobility: No   Discharge Criteria: Patient  will be discharged from PT if patient refuses treatment 3 consecutive times without medical reason, if treatment goals not met, if there is a change in medical status, if patient makes no progress towards goals or if patient is discharged from hospital.  The above assessment, treatment plan, treatment alternatives and goals were discussed  and mutually agreed upon: by patient  Edwin Cap PT, DPT 10/27/2022, 3:54 PM

## 2022-10-27 NOTE — Progress Notes (Signed)
PROGRESS NOTE   Subjective/Complaints: No events overnight. No complaints today, ready to work with therapies! Hyponatremia and AKI today with uptrending BUN. CBC stable.  LBM 8/12, incontinent bladder with some mild retention overnight.   ROS: Denies fevers, chills, N/V, abdominal pain, constipation, diarrhea, SOB, cough, chest pain, new weakness or paraesthesias.    Objective:   EEG adult  Result Date: 10/25/2022 Charlsie Quest, MD     10/25/2022  4:41 PM Patient Name: Frank Barron MRN: [id number] Epilepsy Attending: Charlsie Quest Referring Physician/Provider: Mathews Argyle, NP Date: 10/25/2022 Duration: 25.25 mins Patient history: 72yo M with Left MCA stroke getting eeg to evaluate for seizure. Level of alertness: Awake, drowsy AEDs during EEG study: None Technical aspects: This EEG study was done with scalp electrodes positioned according to the 10-20 International system of electrode placement. Electrical activity was reviewed with band pass filter of 1-70Hz , sensitivity of 7 uV/mm, display speed of 44mm/sec with a 60Hz  notched filter applied as appropriate. EEG data were recorded continuously and digitally stored.  Video monitoring was available and reviewed as appropriate. Description:  The posterior dominant rhythm consists of 8 Hz activity of moderate voltage (25-35 uV) seen predominantly in posterior head regions, asymmetric ( left<right) and reactive to eye opening and eye closing.  Drowsiness was characterized by attenuation of posterior dominant rhythm. EEG showed continuous 3 to 6 Hz theta-delta slowing in left hemisphere as well as intermittent generalized 3-5Hz  theta-delta slowing. Hyperventilation and photic stimulation were not performed.    ABNORMALITY - Continuous slow, left  hemisphere - Intermittent slow, generalized - Background asymmetry, left<right  IMPRESSION: This study is suggestive of cortical dysfunction  arising from left hemisphere likely secondary to underlying stroke. Additionally there is mild to moderate diffuse encephalopathy. No seizures or epileptiform discharges were seen throughout the recording.  Priyanka Annabelle Harman   CT HEAD WO CONTRAST ( )  Addendum Date: 10/25/2022   ADDENDUM REPORT: 10/25/2022 13:59 ADDENDUM: Alternatively, a CTA head could also be considered for more definitive characterization. Electronically Signed   By: Lorenza Cambridge M.D.   On: 10/25/2022 13:59   Result Date: 10/25/2022 CLINICAL DATA:  Neuro deficit, acute, stroke suspected EXAM: CT HEAD WITHOUT CONTRAST TECHNIQUE: Contiguous axial images were obtained from the base of the skull through the vertex without intravenous contrast. RADIATION DOSE REDUCTION: This exam was performed according to the departmental dose-optimization program which includes automated exposure control, adjustment of the mA and/or kV according to patient size and/or use of iterative reconstruction technique. COMPARISON:  Brain MRI 10/23/2022 FINDINGS: Brain: Evolving left MCA territory infarct. There is a focal 1.2 x 0.8 cm hyperdenseregion in the region of the left sylvian fissure, which is along the course of the left MCA branches, and could represent contrast pooling or a site of parenchymal hemorrhage. No mass effect. No hydrocephalus. No extra-axial fluid collection. Vascular: Diffusely hyperdense appearance of the left MCA involving the M1 through the M3 segments. Skull: Normal. Negative for fracture or focal lesion. Sinuses/Orbits: No middle ear or mastoid effusion. Paranasal sinuses are clear. Right lens replacement with a scleral band. Other: None. IMPRESSION: 1. Evolving left MCA territory infarct. Focal 1.2 x 0.8 cm hyperdense  region in the region of the left Sylvian fissure could represent contrast pooling or a site of parenchymal hemorrhage. Recommend short term follow-up CT to ensure stability. 2. Diffusely hyperdense appearance of the left  MCA involving the M1 through the M3 segments, worrisome for thrombus. These results will be called to the ordering clinician or representative by the Radiologist Assistant, and communication documented in the PACS or [employer]. Electronically Signed: By: Lorenza Cambridge M.D. On: 10/25/2022 13:56   Recent Labs    10/25/22 1431  WBC 5.9  HGB 14.5  HCT 42.4  PLT 131*   Recent Labs    10/25/22 1431  NA 132*  K 4.5  CL 103  CO2 17*  GLUCOSE 119*  BUN 24*  CREATININE 1.41*  CALCIUM 8.9    Intake/Output Summary (Last 24 hours) at 10/27/2022 0852 Last data filed at 10/26/2022 1843 Gross per 24 hour  Intake 103.71 ml  Output --  Net 103.71 ml        Physical Exam: Vital Signs Blood pressure (!) 154/97, pulse (!) 52, temperature 98 F (36.7 C), temperature source Oral, resp. rate 16, height 6\' 3"  (1.905 m), weight 97.6 kg, SpO2 100%.  General: No apparent distress. Sitting upright with SLP working on eating.  HEENT: Head is normocephalic, atraumatic, PERRLA, EOMI, sclera anicteric, oral mucosa pink and moist Neck: Supple without JVD or lymphadenopathy Heart: Reg rate and rhythm. No murmurs rubs or gallops Chest: CTA bilaterally without wheezes, rales, or rhonchi; no distress Abdomen: Soft, non-tender, non-distended, bowel sounds positive. Extremities: No clubbing, cyanosis, or edema.    Psych: Pt's affect is flat. Pt is cooperative Skin: Clean and intact without signs of breakdown Neuro: Alert and oriented to person, hospital, year with choices,  + expressive greater than receptive aphasia,  CN: + right facial droop, decreased shoulder shrug on the right  Strength 5 out of 5 in left upper extremity left lower extremity Strength 0 out of 5 throughout right upper extremity and right lower extremity Sensation to light touch decreased in right upper extremity and right lower extremity  Musculoskeletal:  MAS 1 R elbow flexors, shoulder adductors MAS 2 R hamstrings         Assessment/Plan: 1. Functional deficits which require 3+ hours per day of interdisciplinary therapy in a comprehensive inpatient rehab setting. Physiatrist is providing close team supervision and 24 hour management of active medical problems listed below. Physiatrist and rehab team continue to assess barriers to discharge/monitor patient progress toward functional and medical goals  Care Tool:  Bathing              Bathing assist       Upper Body Dressing/Undressing Upper body dressing        Upper body assist      Lower Body Dressing/Undressing Lower body dressing            Lower body assist       Toileting Toileting    Toileting assist       Transfers Chair/bed transfer  Transfers assist           Locomotion Ambulation   Ambulation assist              Walk 10 feet activity   Assist           Walk 50 feet activity   Assist           Walk 150 feet activity   Assist  Walk 10 feet on uneven surface  activity   Assist           Wheelchair     Assist               Wheelchair 50 feet with 2 turns activity    Assist            Wheelchair 150 feet activity     Assist          Blood pressure (!) 154/97, pulse (!) 52, temperature 98 F (36.7 C), temperature source Oral, resp. rate 16, height 6\' 3"  (1.905 m), weight 97.6 kg, SpO2 100%.  Medical Problem List and Plan: 1. 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 Pt has hx of prior CVA with residual aphasia.              -patient may shower             -ELOS/Goals: 14-18, PT/OT/SLP min A to sup             -Order PRAFO/WHO             -stable for CIR   2.  Antithrombotics: -DVT/anticoagulation:  Pharmaceutical: Xarelto w/breakfast?             -antiplatelet therapy: N/A 3. Pain Management:  Tylenol. Local measures.  --Per chart review was  on tramadol? Naltrexone? Lidocaine patches?    - No endorsed pain this AM; monitor  4. Mood/Behavior/Sleep: LCSW to follow for evaluation and support.              -antipsychotic agents: N/A  5. Neuropsych/cognition: This patient is not capable of making decisions on his own behalf.  6. Skin/Wound Care: Routine pressure relief measures  7. Fluids/Electrolytes/Nutrition: Monitor I/O. Check CMET in am             --assist with meals  8. HTN: Home meds Amlodipine 5mg , Hydralazine 25mg  daily,  goal SBP < 180, long term goal normotensive -Monitor BP TID continue amlodipine - stable Vitals:   10/27/22 1422 10/27/22 1952  BP: (!) 125/97 (!) 122/91  Pulse: 68 68  Resp: 18 18  Temp: 98 F (36.7 C) 98.6 F (37 C)  SpO2: 100% 100%    9. A fib: Monitor HR TID. On xarelto. Heart rate controlled  10.  Thrombocytopenia: Recheck platelets in am. Monitor for signs of bleeding. 11. Dysphagia: On D2,thins. Encourage fluid intake   - Diet eval this AM  12. Acute on chronic renal failure:  SCr 1.32 at admission.  --Continue to monitor and encourage intake  - uptrending on last labs 8/12, encourage fluids, BMP in AM  13. Hyponatremia: Na dropped to 132 in 48 hours. Recheck in am             --monitor with question of IPH or pooling on CT 08/12--> repeat in 1-2 days?   - Admission labs pending tomorrow for recheck  14.  H/o CAD s/p PCI: Monitor for symptoms with increase in activity.             --continue Crestor.  15.  Situational depression: Lexapro 10mg  added on acute.    - Also benefit for motor recovery of UE; would not adjust  16. Hx of multiple cancers: R-RCC s/p nephrectomy 2015.  -Right adrenal pheochromocytoma  s/p right adrenalectomy 2017.  -Prostate CA s/p IMRT 2018.  Continue flomax -RUL nodule being monitored.  17.  H/o Emphysema?: Noted on CT chest on care everywhere.    - stable  18. HLD. Continue statin  19. Substance abuse. Encourage cessation    LOS: 1 days A  FACE TO FACE EVALUATION WAS PERFORMED  Angelina Sheriff 10/27/2022, 8:52 AM

## 2022-10-27 NOTE — Progress Notes (Signed)
Inpatient Rehabilitation  Patient information reviewed and entered into eRehab system by Melissa M. Bowie, M.A., CCC/SLP, PPS Coordinator.  Information including medical coding, functional ability and quality indicators will be reviewed and updated through discharge.    

## 2022-10-27 NOTE — Evaluation (Signed)
Occupational Therapy Assessment and Plan  Patient Details  Name: Frank Barron MRN: [id number] Date of Birth: 12/12/1950  OT Diagnosis: cognitive deficits, hemiplegia affecting dominant side, and muscle weakness (generalized) Rehab Potential: Rehab Potential (ACUTE ONLY): Good ELOS: 3-4 weeks   Today's Date: 10/27/2022 OT Individual Time: 0865-7846 OT Individual Time Calculation (min): 75 min     Hospital Problem: Principal Problem:   Acute ischemic left middle cerebral artery (MCA) stroke (HCC) Active Problems:   Acute renal failure superimposed on chronic kidney disease (HCC)   Hyponatremia   Past Medical History:  Past Medical History:  Diagnosis Date   Anemia    Anxiety    Arthritis    Atrial fibrillation (HCC)    CAD (coronary artery disease)    PCI to LAD 2012   CHF (congestive heart failure) (HCC)    Chronic back pain    Depression    [name] 07/19/2005 (09/30/2012)   H/O ETOH abuse    Hepatitis C    pt denies this hx on 09/30/2012   High cholesterol    History of DVT of lower extremity    "left; long time ago" (09/30/2012)   Hypertension    OA (osteoarthritis) of hip    moderate to severe Right hip   Prostate cancer (HCC)    Renal cell carcinoma (HCC)    Stroke (HCC) 11/18/2011   [name] 07/31/2012; expressive aphasia noted on 09/30/2012   Past Surgical History:  Past Surgical History:  Procedure Laterality Date   COLONOSCOPY N/A 10/01/2012   Procedure: COLONOSCOPY;  Surgeon: Louis Meckel, MD;  Location: [facility] ENDOSCOPY;  Service: Endoscopy;  Laterality: N/A;   CORONARY ANGIOPLASTY WITH STENT PLACEMENT     ESOPHAGOGASTRODUODENOSCOPY N/A 10/01/2012   Procedure: ESOPHAGOGASTRODUODENOSCOPY (EGD);  Surgeon: Louis Meckel, MD;  Location: [facility] ENDOSCOPY;  Service: Endoscopy;  Laterality: N/A;   EYE SURGERY Right 1975   /notes 07/19/2005; "injured playing football" (09/29/2012)   GIVENS CAPSULE STUDY N/A 10/02/2012   Procedure: GIVENS CAPSULE STUDY;  Surgeon: Louis Meckel, MD;  Location: [facility] ENDOSCOPY;  Service: Endoscopy;  Laterality: N/A;   IR ANGIO VERTEBRAL SEL SUBCLAVIAN INNOMINATE UNI L MOD SED  10/22/2022   IR CT HEAD LTD  10/22/2022   IR CT HEAD LTD  10/22/2022   IR PERCUTANEOUS ART THROMBECTOMY/INFUSION INTRACRANIAL INC DIAG ANGIO  10/22/2022   JOINT REPLACEMENT  01/03/2007   LUMBAR DISC SURGERY  2001   herniated disc/notes 07/19/2005 (09/30/2012)   NEPHRECTOMY  12/26/2013   Laparascopic , [facility]   RADIOLOGY WITH ANESTHESIA N/A 10/22/2022   Procedure: IR WITH ANESTHESIA;  Surgeon: Radiologist, Medication, MD;  Location: [facility] OR;  Service: Radiology;  Laterality: N/A;   TOTAL HIP ARTHROPLASTY Left 01/03/2007   [name] 01/03/2007 (09/30/2012)   TOTAL KNEE ARTHROPLASTY Left 09/12/2014   Procedure: LEFT TOTAL KNEE ARTHROPLASTY;  Surgeon: Tarry Kos, MD;  Location: [facility] OR;  Service: Orthopedics;  Laterality: Left;    Assessment & Plan Clinical Impression: Frank Barron is a 72 year old male with history of A fib on Xarelto, CAD s/p PCI, CHF, DVT, HTN, Lumbar spondylosis/chronic pain, OA right hip,  RCC, prostate CA, 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 10/22/22 after being found on the floor early morning of 10/22/22. He was noted to have aphasia with dysarthria and right sided weakness. UDS positive for cocaine CT head showed possible new indeterminate infarct in left basal ganglia and CTA head/neck showed acute L M1 occlusion  with 65 cc ischemic penumbra without core infarct.  He underwent cerebral angio with TICI 2 revascularization of distal L-MCA M1 segment wit post procedure CT head showing mild L-SAH in distal parietal cortical region. MRI brain done showing new completed L-BG stroke with  with abnormal FLAIR signal in proximal L-MCA s/o slow flow v/s occlusion and was not a candidate for repeat revascularization due to risk of hemorrhagic transformation.    2D echo showed EF 65-70% and no wall abnormality.  He was  started on Lexapro for mood stabilized. On 08/12 pm, he was found to be lethargic, non verbal and transient left sided weakness. RR evalauted patient and patient back to baseline at end of event. Repeat CT head on 08/12 showed evolving L-MCA infarct with 1.2 cm X 0.8 cm hyperdense region. Question contrast pooling v/s site of IPH--short term follow up recommended and diffuse hyperdense appearence of L-MCA involving M1 worrisome for thrombus.  He was treated with 500 cc fluid bolus with some improvement in mental status. EEG done showing  mild to moderate diffuse encephalopathy without seizures. On D2, thin with supervision for safety.   Patient transferred to CIR on 10/26/2022 .    Patient currently requires max with basic self-care skills secondary to muscle weakness and muscle joint tightness, decreased cardiorespiratoy endurance, abnormal tone, unbalanced muscle activation, decreased coordination, and decreased motor planning, decreased visual acuity, decreased midline orientation and decreased attention to right, decreased problem solving, decreased safety awareness, and delayed processing, and decreased sitting balance, decreased standing balance, decreased postural control, hemiplegia, decreased balance strategies, and difficulty maintaining precautions.  Prior to hospitalization, patient could complete ADLs with independent .  Patient will benefit from skilled intervention to increase independence with basic self-care skills prior to discharge home with care partner.  Anticipate patient will require 24 hour supervision and minimal physical assistance and follow up home health.  OT - End of Session Activity Tolerance: Tolerates 30+ min activity without fatigue Endurance Deficit: Yes Endurance Deficit Description: generalized weakness OT Assessment Rehab Potential (ACUTE ONLY): Good OT Barriers to Discharge:  (unknown- no family present to confirm) OT Patient demonstrates impairments in the  following area(s): Balance;Cognition;Endurance;Motor;Pain;Perception;Safety;Sensory;Vision OT Basic ADL's Functional Problem(s): Eating;Bathing;Grooming;Toileting;Dressing OT Transfers Functional Problem(s): Toilet OT Additional Impairment(s): Fuctional Use of Upper Extremity OT Plan OT Intensity: Minimum of 1-2 x/day, 45 to 90 minutes OT Frequency: 5 out of 7 days OT Duration/Estimated Length of Stay: 3-4 weeks OT Treatment/Interventions: Balance/vestibular training;Functional electrical stimulation;Self Care/advanced ADL retraining;UE/LE Coordination activities;Cognitive remediation/compensation;Functional mobility training;Skin care/wound managment;Community reintegration;Wheelchair propulsion/positioning;Visual/perceptual remediation/compensation;Neuromuscular re-education;Splinting/orthotics;Discharge planning;Pain management;Therapeutic Activities;Disease mangement/prevention;Patient/family education;Therapeutic Exercise;DME/adaptive equipment instruction;Psychosocial support;UE/LE Strength taining/ROM OT Self Feeding Anticipated Outcome(s): (S) OT Basic Self-Care Anticipated Outcome(s): min A OT Toileting Anticipated Outcome(s): min A OT Bathroom Transfers Anticipated Outcome(s): min A OT Recommendation Patient destination: Home Follow Up Recommendations: Home health OT Equipment Recommended: To be determined   OT Evaluation Precautions/Restrictions  Precautions Precautions: Fall Precaution Comments: R hemiplegia, aphasia Restrictions Weight Bearing Restrictions: No General Chart Reviewed: Yes Family/Caregiver Present: No Vital Signs Therapy Vitals Temp: 98 F (36.7 C) Temp Source: Oral Pulse Rate: (!) 52 Resp: 16 BP: (!) 154/97 Patient Position (if appropriate): Lying Oxygen Therapy SpO2: 100 % O2 Device: Room Air Pain   Home Living/Prior Functioning Home Living Family/patient expects to be discharged to:: Private residence Living Arrangements: Spouse/significant  other Additional Comments: pt unable to provide due to aphasia IADL History Homemaking Responsibilities:  (per chart, assist with IADLs, independent with ADLs) Vision Baseline Vision/History: 1 Wears glasses  Ability to See in Adequate Light: 1 Impaired Patient Visual Report: No change from baseline (shaking head no) Vision Assessment?: Vision impaired- to be further tested in functional context Additional Comments: R eye misaligned, unclear what is baseline. Pt able to track. Need to further assesss Perception  Perception: Impaired Perception-Other Comments: R inattention Praxis Praxis: Impaired Praxis Impairment Details: Motor planning Cognition Cognition Overall Cognitive Status: Difficult to assess Arousal/Alertness: Awake/alert Orientation Level: Nonverbal/unable to assess Comments: Very difficult to assess 2/2 aphasia. Pt able to follow all commands in a functional setting and during ADLs. He was able to point to items to communicate basic needs (i.e. menu to indicate he hadn't eaten breakfast) but did not indicate heavy urinary incontinence Brief Interview for Mental Status (BIMS) Repetition of Three Words (First Attempt):  (aphasic) Sensation Sensation Light Touch: Impaired by gross assessment Additional Comments: Sensation possibly absent, at least severely impaired in the RUE/RLE Coordination Gross Motor Movements are Fluid and Coordinated: No Fine Motor Movements are Fluid and Coordinated: No Coordination and Movement Description: Dense R hemiplegia Finger Nose Finger Test: Unable to follow commands accurately- able to touch nose without deviation Motor  Motor Motor: Hemiplegia  Trunk/Postural Assessment  Cervical Assessment Cervical Assessment: Within Functional Limits Thoracic Assessment Thoracic Assessment: Within Functional Limits Lumbar Assessment Lumbar Assessment: Exceptions to [facility] (posterior pelvic tilt) Postural Control Postural Control: Deficits on  evaluation Righting Reactions: delayed Protective Responses: delayed  Balance Balance Balance Assessed: Yes Static Sitting Balance Static Sitting - Balance Support: Feet supported Static Sitting - Level of Assistance: 5: Stand by assistance Dynamic Sitting Balance Dynamic Sitting - Balance Support: Feet supported Dynamic Sitting - Level of Assistance: 5: Stand by assistance;4: Min assist (CGA) Static Standing Balance Static Standing - Balance Support: Left upper extremity supported Static Standing - Level of Assistance: 2: Max assist Dynamic Standing Balance Dynamic Standing - Balance Support: Left upper extremity supported Dynamic Standing - Level of Assistance: 1: +2 Total assist Extremity/Trunk Assessment RUE Assessment RUE Assessment: Exceptions to [facility] General Strength Comments: Reflexive movements observed in his hand and elbow but unable to actively move RUE Body System: Neuro Brunstrum levels for arm and hand: Arm;Hand Brunstrum level for arm: Stage I Presynergy Brunstrum level for hand: Stage I Flaccidity LUE Assessment LUE Assessment: Within Functional Limits  Care Tool Care Tool Self Care Eating   Eating Assist Level: Supervision/Verbal cueing    Oral Care    Oral Care Assist Level: Supervision/Verbal cueing    Bathing   Body parts bathed by patient: Chest;Abdomen;Right upper leg;Left upper leg;Face Body parts bathed by helper: Right arm;Front perineal area;Buttocks;Right lower leg;Left lower leg   Assist Level: Maximal Assistance - Patient 24 - 49%    Upper Body Dressing(including orthotics)   What is the patient wearing?: Pull over shirt   Assist Level: Maximal Assistance - Patient 25 - 49%    Lower Body Dressing (excluding footwear)   What is the patient wearing?: Incontinence brief;Pants Assist for lower body dressing: 2 Helpers    Putting on/Taking off footwear   What is the patient wearing?: Non-skid slipper socks Assist for footwear: 2 Helpers        Care Tool Toileting Toileting activity   Assist for toileting: 2 Helpers     Care Tool Bed Mobility Roll left and right activity   Roll left and right assist level: Moderate Assistance - Patient 50 - 74%    Sit to lying activity   Sit to lying assist level: Moderate Assistance - Patient  50 - 74%    Lying to sitting on side of bed activity   Lying to sitting on side of bed assist level: the ability to move from lying on the back to sitting on the side of the bed with no back support.: Moderate Assistance - Patient 50 - 74%     Care Tool Transfers Sit to stand transfer   Sit to stand assist level: Total Assistance - Patient < 25%    Chair/bed transfer   Chair/bed transfer assist level: Dependent - mechanical lift     Toilet transfer   Assist Level: 2 Helpers     Care Tool Cognition  Expression of Ideas and Wants Expression of Ideas and Wants: 1. Rarely/Never expressess or very difficult - rarely/never expresses self or speech is very difficult to understand  Understanding Verbal and Non-Verbal Content Understanding Verbal and Non-Verbal Content: 2. Sometimes understands - understands only basic conversations or simple, direct phrases. Frequently requires cues to understand   Memory/Recall Ability     Refer to Care Plan for Long Term Goals  SHORT TERM GOAL WEEK 1 OT Short Term Goal 1 (Week 1): Pt will don shirt with minA OT Short Term Goal 2 (Week 1): Pt will complete sit <> stand with LUE support, mod A OT Short Term Goal 3 (Week 1): Pt will complete LB dressing with max A +1  Recommendations for other services: None    Skilled Therapeutic Intervention ADL ADL Eating: Supervision/safety Where Assessed-Eating: Chair Grooming: Supervision/safety Where Assessed-Grooming: Sitting at sink Upper Body Bathing: Moderate assistance Where Assessed-Upper Body Bathing: Edge of bed Lower Body Bathing: Dependent Where Assessed-Lower Body Bathing: Bed level Upper Body  Dressing: Moderate assistance Where Assessed-Upper Body Dressing: Edge of bed Lower Body Dressing: Dependent Where Assessed-Lower Body Dressing: Edge of bed Toileting: Dependent Where Assessed-Toileting: Bed level Toilet Transfer: Dependent Toilet Transfer Method: Other (comment) (stedy) Mobility  Bed Mobility Bed Mobility: Rolling Right;Rolling Left Rolling Right: Moderate Assistance - Patient 50-74% Rolling Left: Maximal Assistance - Patient 25-49% Transfers Sit to Stand: Maximal Assistance - Patient 25-49% Stand to Sit: Maximal Assistance - Patient 25-49%  Skilled OT evaluation completed with the creation of pt centered OT POC. Pt educated on condition, ELOS, rehab expectations, and fall risk reduction strategies throughout session. Pt aphasic and unable to complete formal cognitive assessment within time constraints of session but was able to follow all commands within context of ADLs. Heavy max-total A to stand from EOB. Tone present in RLE. He completed stedy transfer with only mod A. R inattention present, not severe. Pt left sitting in wc with all needs met, chair alarm set.     Discharge Criteria: Patient will be discharged from OT if patient refuses treatment 3 consecutive times without medical reason, if treatment goals not met, if there is a change in medical status, if patient makes no progress towards goals or if patient is discharged from hospital.  The above assessment, treatment plan, treatment alternatives and goals were discussed and mutually agreed upon: by patient  Crissie Reese 10/27/2022, 9:19 AM

## 2022-10-27 NOTE — Evaluation (Addendum)
Speech Language Pathology Assessment and Plan  Patient Details  Name: Frank Barron MRN: [id number] Date of Birth: 09/15/1950  SLP Diagnosis: Aphasia;Dysarthria;Apraxia;Speech and Language deficits;Dysphagia  Rehab Potential: Good ELOS: 3-4 weeks    Today's Date: 10/27/2022 SLP Individual Time: 1102-1200 SLP Individual Time Calculation (min): 58 min   Hospital Problem: Principal Problem:   Acute ischemic left middle cerebral artery (MCA) stroke (HCC) Active Problems:   Acute renal failure superimposed on chronic kidney disease (HCC)   Hyponatremia  Past Medical History:  Past Medical History:  Diagnosis Date   Anemia    Anxiety    Arthritis    Atrial fibrillation (HCC)    CAD (coronary artery disease)    PCI to LAD 2012   CHF (congestive heart failure) (HCC)    Chronic back pain    Depression    Frank Barron 07/19/2005 (09/30/2012)   H/O ETOH abuse    Hepatitis C    pt denies this hx on 09/30/2012   High cholesterol    History of DVT of lower extremity    "left; long time ago" (09/30/2012)   Hypertension    OA (osteoarthritis) of hip    moderate to severe Right hip   Prostate cancer (HCC)    Renal cell carcinoma (HCC)    Stroke (HCC) 11/18/2011   Frank Barron 07/31/2012; expressive aphasia noted on 09/30/2012   Past Surgical History:  Past Surgical History:  Procedure Laterality Date   COLONOSCOPY N/A 10/01/2012   Procedure: COLONOSCOPY;  Surgeon: Louis Meckel, MD;  Location: [facility] ENDOSCOPY;  Service: Endoscopy;  Laterality: N/A;   CORONARY ANGIOPLASTY WITH STENT PLACEMENT     ESOPHAGOGASTRODUODENOSCOPY N/A 10/01/2012   Procedure: ESOPHAGOGASTRODUODENOSCOPY (EGD);  Surgeon: Louis Meckel, MD;  Location: [facility] ENDOSCOPY;  Service: Endoscopy;  Laterality: N/A;   EYE SURGERY Right 1975   /notes 07/19/2005; "injured playing football" (09/29/2012)   GIVENS CAPSULE STUDY N/A 10/02/2012   Procedure: GIVENS CAPSULE STUDY;  Surgeon: Louis Meckel, MD;  Location: [facility] ENDOSCOPY;  Service:  Endoscopy;  Laterality: N/A;   IR ANGIO VERTEBRAL SEL SUBCLAVIAN INNOMINATE UNI L MOD SED  10/22/2022   IR CT HEAD LTD  10/22/2022   IR CT HEAD LTD  10/22/2022   IR PERCUTANEOUS ART THROMBECTOMY/INFUSION INTRACRANIAL INC DIAG ANGIO  10/22/2022   JOINT REPLACEMENT  01/03/2007   LUMBAR DISC SURGERY  2001   herniated disc/notes 07/19/2005 (09/30/2012)   NEPHRECTOMY  12/26/2013   Laparascopic , [facility]   RADIOLOGY WITH ANESTHESIA N/A 10/22/2022   Procedure: IR WITH ANESTHESIA;  Surgeon: Radiologist, Medication, MD;  Location: [facility] OR;  Service: Radiology;  Laterality: N/A;   TOTAL HIP ARTHROPLASTY Left 01/03/2007   Frank Barron 01/03/2007 (09/30/2012)   TOTAL KNEE ARTHROPLASTY Left 09/12/2014   Procedure: LEFT TOTAL KNEE ARTHROPLASTY;  Surgeon: Tarry Kos, MD;  Location: [facility] OR;  Service: Orthopedics;  Laterality: Left;    Assessment / Plan / Recommendation Clinical Impression HPI:  Frank Barron is a 72 year old male with history of A fib on Xarelto, CAD s/p PCI, CHF, DVT, HTN, Lumbar spondylosis/chronic pain, OA right hip,  RCC, prostate CA, 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 He was noted to have aphasia with dysarthria and right sided weakness. UDS positive for cocaine CT head showed possible new indeterminate infarct in left basal ganglia and CTA head/neck showed acute L M1 occlusion with 65 cc ischemic penumbra without  core infarct.  He underwent cerebral angio with TICI 2 revascularization of distal L-MCA M1 segment wit post procedure CT head showing mild L-SAH in distal parietal cortical region. MRI brain done showing new completed L-BG stroke with  with abnormal FLAIR signal in proximal L-MCA s/o slow flow v/s occlusion and was not a candidate for repeat revascularization due to risk of hemorrhagic transformation.    On 08/12 pm, he was found to be lethargic, non verbal and transient left sided  weakness. RR evalauted patient and patient back to baseline at end of event. Repeat CT head on 08/12 showed evolving L-MCA infarct with 1.2 cm X 0.8 cm hyperdense region. Question contrast pooling v/s site of IPH--short term follow up recommended and diffuse hyperdense appearence of L-MCA involving M1 worrisome for thrombus. On D2, thin with supervision for safety.     At baseline, patient is independent with ADLs and was able to ambulate without AD per brother. He managed home/cooked as his wife had renal transplant a few weeks ago, his sister was helping them out.  Clinical Impression:   Patient presents with overt s/sx of moderate oropharyngeal dysphagia. Oral mechanism exam revealed R side facial/lingual weakness and strength. Patient was administered ice chips, thin liquids (via straw and cup), puree and Dys 2 textures. Oral phase remarkable for reduced lingual weakness and strength as well as incomplete mastication resulting in oral residuals and need for softened textures. Patient with immediate cough during consumption of thin liquids via straw after consecutive sips and as liquid clearance. No other overt s/sx of aspiration during evaluation. Recommend continuation of dys 2 textures with thin liquids (no straws) to aid in consumption of small bites/sips. Full supervision. Medication may be administered crushed in puree.   Patient presents with severe aphasia and questionable apraxia resulting in deficits in expression and comprehension. Patient with ability to answer biographical yes/no questions with 50% accuracy, though unable to name basic objects in room despite max semantic and phonemic cues. Patient able to follow 1 step commands with 100% accuracy, however unable to follow 2 step commands. Patient with verbal attempts at repetition of single words, however only able to correctly repeat first phoneme. During automatic speech tasks, patient able to recall 3 days of the week (out of order) and  4/10 numbers however with perseveration on 'one.' Patient with strengths in automatic musical tasks such as singing "happy birthday." Patient able to write name and copy single words with L hand. Unable to formally assess cognition due to deficits in language, but deficits in attention were noted throughout the evaluation.   Patient with few spontaneous verbalizations during evaluation, though during verbal output, patient with moderate-severe dysarthria due to imprecise articulation and questionable apraxia (motor planning deficits).   Pt would benefit from skilled ST to maximize language, dysarthria/apraxia, and dysphagia in order to maximize functional independence prior to d/c. Anticipate patient will require full supervision at home and f/u [facility] SLP services.    Skilled Therapeutic Interventions          Patient evaluated using a non-standardized assessment and bedside swallow evaluation to assess current cognitive, communicative and swallowing function. See above for details.   SLP Assessment  Patient will need skilled Speech Lanaguage Pathology Services during CIR admission    Recommendations  SLP Diet Recommendations: Dysphagia 2 (Fine chop);Thin Liquid Administration via: Cup;No straw Medication Administration: Crushed with puree Supervision: Patient able to self feed;Full supervision/cueing for compensatory strategies Compensations: Minimize environmental distractions;Slow rate;Small sips/bites Postural Changes and/or Swallow  Maneuvers: Seated upright 90 degrees Oral Care Recommendations: Oral care before and after PO Patient destination: Home Follow up Recommendations: Home Health SLP Equipment Recommended: To be determined    SLP Frequency 3 to 5 out of 7 days   SLP Duration  SLP Intensity  SLP Treatment/Interventions 3-4 weeks   Minumum of 1-2 x/day, 30 to 90 minutes  Dysphagia/aspiration precaution training;Internal/external aids;Speech/Language facilitation;Cueing  hierarchy;Therapeutic Activities;Functional tasks;Multimodal communication approach;Patient/family education;Therapeutic Exercise    Pain Pain Assessment Pain Scale: 0-10 Pain Score: 0-No pain  SLP Evaluation Cognition Overall Cognitive Status: Difficult to assess Arousal/Alertness: Awake/alert Awareness: Impaired Awareness Impairment: Intellectual impairment Comments: Very difficult to assess 2/2 aphasia. Pt able to follow all commands in a functional setting and during ADLs. He was able to point to items to communicate basic needs (i.e. menu to indicate he hadn't eaten breakfast) but did not indicate heavy urinary incontinence  Comprehension Auditory Comprehension Overall Auditory Comprehension: Impaired Yes/No Questions: Impaired Basic Biographical Questions: 51-75% accurate Basic Immediate Environment Questions: 50-74% accurate Complex Questions: Not tested Commands: Impaired One Step Basic Commands: 75-100% accurate EffectiveTechniques: Extra processing time Reading Comprehension Reading Status: Impaired Word level: Impaired Expression Expression Primary Mode of Expression: Verbal Verbal Expression Overall Verbal Expression: Impaired Automatic Speech: Social Response;Singing;Counting;Day of week Repetition: Impaired Level of Impairment: Word level Naming: Impairment Confrontation: Impaired Non-Verbal Means of Communication: Gestures;Communication board;Writing Oral Motor Oral Motor/Sensory Function Overall Oral Motor/Sensory Function: Moderate impairment Facial ROM: Reduced right;Suspected CN VII (facial) dysfunction Facial Symmetry: Abnormal symmetry right;Suspected CN VII (facial) dysfunction Facial Strength: Reduced right;Suspected CN VII (facial) dysfunction Lingual ROM: Reduced right Lingual Strength: Reduced Motor Speech Overall Motor Speech: Impaired Respiration: Within functional limits Phonation: Normal Resonance: Within functional limits Articulation:  Impaired Level of Impairment: Word Intelligibility: Intelligibility reduced Word: 0-24% accurate Motor Planning: Impaired Level of Impairment: Word  Care Tool Care Tool Cognition Ability to hear (with hearing aid or hearing appliances if normally used Ability to hear (with hearing aid or hearing appliances if normally used): 0. Adequate - no difficulty in normal conservation, social interaction, listening to TV   Expression of Ideas and Wants Expression of Ideas and Wants: 1. Rarely/Never expressess or very difficult - rarely/never expresses self or speech is very difficult to understand   Understanding Verbal and Non-Verbal Content Understanding Verbal and Non-Verbal Content: 2. Sometimes understands - understands only basic conversations or simple, direct phrases. Frequently requires cues to understand  Memory/Recall Ability     Bedside Swallowing Assessment General Previous Swallow Assessment: 8/10 Diet Prior to this Study: Dysphagia 2 (finely chopped);Thin liquids (Level 0) Respiratory Status: Room air Behavior/Cognition: Alert Oral Cavity - Dentition: Missing dentition Self-Feeding Abilities: Needs assist Vision: Functional for self-feeding Patient Positioning: Upright in chair/Tumbleform Volitional Cough: Cognitively unable to elicit Volitional Swallow: Unable to elicit  Ice Chips Ice chips: Within functional limits Thin Liquid Thin Liquid: Impaired Presentation: Cup;Straw;Spoon;Self Fed Oral Phase Impairments: Reduced labial seal;Reduced lingual movement/coordination Pharyngeal  Phase Impairments: Multiple swallows;Cough - Immediate Other Comments: immediate cough x2, one after consecutive sips via straw, one after thin used for oral clearance Nectar Thick Nectar Thick Liquid: Not tested Honey Thick Honey Thick Liquid: Not tested Puree Puree: Within functional limits Presentation: Spoon Solid Solid: Impaired Oral Phase Impairments: Impaired mastication Oral Phase  Functional Implications: Oral residue;Impaired mastication BSE Assessment Risk for Aspiration Impact on safety and function: Moderate aspiration risk Other Related Risk Factors: Previous CVA  Short Term Goals: Week 1: SLP Short Term Goal 1 (Week 1): Patient will utilize  compensatory strategies to consume dys2 / thin liquids with no overt s/sx of aspiration with maxA SLP Short Term Goal 2 (Week 1): Patient will utilize multimodal means of communication to express wants/needs with maxA SLP Short Term Goal 3 (Week 1): Patient will participate in automatic speech tasks with 25% accuracy and max multimodal A SLP Short Term Goal 4 (Week 1): Patient will increase word finding of common objects using compensatory strategies with max multimodal A SLP Short Term Goal 5 (Week 1): Patinet will demonstrate sustained attention with max multimodal A for 5 minutes during functional tasks  Refer to Care Plan for Long Term Goals  Recommendations for other services: None   Discharge Criteria: Patient will be discharged from SLP if patient refuses treatment 3 consecutive times without medical reason, if treatment goals not met, if there is a change in medical status, if patient makes no progress towards goals or if patient is discharged from hospital.  The above assessment, treatment plan, treatment alternatives and goals were discussed and mutually agreed upon: by patient    M.A., CF-SLP 10/27/2022, 12:42 PM

## 2022-10-27 NOTE — Progress Notes (Addendum)
Patient ID: Frank Barron, male   DOB: Jan 30, 1951, 72 y.o.   MRN: [id number]  SW made efforts to complete assessment with pt, however unable due to aphasia.   1436- SW made contact with [name], however, he was in the  middle of a conference presentation. SW briefly introduced self, and requested return phone call when available.   SW sent H&P to [place] [facility]. Reports that pt is not eligible for SNF under [facility] (would need to use Medicare if needs short-term rehab upon discharge); Is eligible for skilled home health, DME and in home aide if needed; [facility]. Concha Pyo.  1522- SW returned phone call to pt brother [name], and waiting on follow-up.   Cecile Sheerer, MSW, LCSWA Office: [phone] Cell: [phone] Fax: [phone]

## 2022-10-27 NOTE — Plan of Care (Deleted)
  Problem: RH Swallowing Goal: LTG Patient will consume least restrictive diet using compensatory strategies with assistance (SLP) Description: LTG:  Patient will consume least restrictive diet using compensatory strategies with assistance (SLP) Flowsheets (Taken 10/27/2022 1226) LTG: Pt Patient will consume least restrictive diet using compensatory strategies with assistance of (SLP): Minimal Assistance - Patient > 75%   Problem: RH Expression Communication Goal: LTG Patient will express needs/wants via multi-modal(SLP) Description: LTG:  Patient will express needs/wants via multi-modal communication (gestures/written, etc) with cues (SLP) Flowsheets (Taken 10/27/2022 1226) LTG: Patient will express needs/wants via multimodal communication (gestures/written, etc) with cueing (SLP): Moderate Assistance - Patient 50 - 74% Goal: LTG Patient will increase speech intelligibility (SLP) Description: LTG: Patient will increase speech intelligibility at word/phrase/conversation level with cues, % of the time (SLP) Flowsheets (Taken 10/27/2022 1226) LTG: Patient will increase speech intelligibility (SLP): Moderate Assistance - Patient 50 - 74% Level: Word Percent of time patient will use intelligible speech: 80 Goal: LTG Patient will increase word finding of common (SLP) Description: LTG:  Patient will increase word finding of common objects/daily info/abstract thoughts with cues using compensatory strategies (SLP). Flowsheets (Taken 10/27/2022 1226) LTG: Patient will increase word finding of common (SLP): Moderate Assistance - Patient 50 - 74% Patient will use compensatory strategies to increase word finding of:  Common objects  Daily info   Problem: RH Attention Goal: LTG Patient will demonstrate this level of attention during functional activites (SLP) Description: LTG:  Patient will will demonstrate this level of attention during functional activites (SLP) Flowsheets (Taken 10/27/2022  1226) Patient will demonstrate during cognitive/linguistic activities the attention type of: Sustained LTG: Patient will demonstrate this level of attention during cognitive/linguistic activities with assistance of (SLP): Moderate Assistance - Patient 50 - 74% Number of minutes patient will demonstrate attention during cognitive/linguistic activities: 15

## 2022-10-28 ENCOUNTER — Inpatient Hospital Stay (HOSPITAL_COMMUNITY): Payer: No Typology Code available for payment source

## 2022-10-28 DIAGNOSIS — I63512 Cerebral infarction due to unspecified occlusion or stenosis of left middle cerebral artery: Secondary | ICD-10-CM | POA: Diagnosis not present

## 2022-10-28 LAB — CBC
HCT: 42 % (ref 39.0–52.0)
Hemoglobin: 14.2 g/dL (ref 13.0–17.0)
MCH: 30.1 pg (ref 26.0–34.0)
MCHC: 33.8 g/dL (ref 30.0–36.0)
MCV: 89 fL (ref 80.0–100.0)
Platelets: 156 10*3/uL (ref 150–400)
RBC: 4.72 MIL/uL (ref 4.22–5.81)
RDW: 14.2 % (ref 11.5–15.5)
WBC: 5.1 10*3/uL (ref 4.0–10.5)
nRBC: 0 % (ref 0.0–0.2)

## 2022-10-28 LAB — BASIC METABOLIC PANEL
Anion gap: 9 (ref 5–15)
BUN: 22 mg/dL (ref 8–23)
CO2: 21 mmol/L — ABNORMAL LOW (ref 22–32)
Calcium: 9.1 mg/dL (ref 8.9–10.3)
Chloride: 103 mmol/L (ref 98–111)
Creatinine, Ser: 1.27 mg/dL — ABNORMAL HIGH (ref 0.61–1.24)
GFR, Estimated: >60 mL/min (ref >60)
Glucose, Bld: 97 mg/dL (ref 70–99)
Potassium: 4.3 mmol/L (ref 3.5–5.1)
Sodium: 133 mmol/L — ABNORMAL LOW (ref 135–145)

## 2022-10-28 MED ORDER — AMLODIPINE BESYLATE 5 MG PO TABS
5.0000 mg | ORAL_TABLET | Freq: Every day | ORAL | Status: DC
  Administered 2022-10-29: 5 mg via ORAL
  Filled 2022-10-28: qty 1

## 2022-10-28 MED ORDER — BACLOFEN 10 MG PO TABS
10.0000 mg | ORAL_TABLET | Freq: Three times a day (TID) | ORAL | Status: DC
  Administered 2022-10-28 – 2022-11-11 (×42): 10 mg via ORAL
  Filled 2022-10-28 (×41): qty 1

## 2022-10-28 MED ORDER — TAMSULOSIN HCL 0.4 MG PO CAPS
0.8000 mg | ORAL_CAPSULE | Freq: Every day | ORAL | Status: DC
  Administered 2022-10-28 – 2022-11-18 (×20): 0.8 mg via ORAL
  Filled 2022-10-28 (×22): qty 2

## 2022-10-28 NOTE — Progress Notes (Signed)
Occupational Therapy Session Note  Patient Details  Name: Frank Barron MRN: [id number] Date of Birth: 1950/08/26  Today's Date: 10/28/2022 OT Individual Time: 0950-1100 OT Individual Time Calculation (min): 70 min    Short Term Goals: Week 1:  OT Short Term Goal 1 (Week 1): Pt will don shirt with minA OT Short Term Goal 2 (Week 1): Pt will complete sit <> stand with LUE support, mod A OT Short Term Goal 3 (Week 1): Pt will complete LB dressing with max A +1  Skilled Therapeutic Interventions/Progress Updates:    Pt greeted semi-reclined in bed asleep initially and overall lethargic throughout session. OT tried to obtain a [name] for therapy session but unable to locate one at this time. Pt was able to wake enough and nodded head yes to get up. He needed max A to get to sitting EOB. Addressed sitting balance at EOB with pt able to get to close supervision/min A statically. Sit<>stand in [place] with bed elevated and max A to power up. Pt wincing in pain indicating painful R LE. Strong extensor tone in R LE. Attempted to get to wc in [place], but he was in too much pain. It took 4 more trials and total A +2 to get him standing again in [place] to safely lower him to the bed. Addressed sitting balance at EOB while performing UB bathing/dressing tasks. He needed hand over hand to initiate and follow commands and dense hemi on R UE. Pt returned to supine for totlal A brief change due to smear of BM. OT performed gentle stretching and ROM to R UE with R UE loose with some mild tone in R fingers. Pt left semi-reclined in bed with bed alarm on, call bell in reach, and needs met.   Therapy Documentation Precautions:  Precautions Precautions: Fall Precaution Comments: R hemiplegia, aphasia (inconsistent yes/no) Restrictions Weight Bearing Restrictions: No Pain Pt unable to rate pain due to aphasia, however, was wincing in pain when trying to stand or move RLE due to strong extensor tone. Rest and  repositioned for comfort.    Therapy/Group: Individual Therapy  [name] 10/28/2022, 11:06 AM

## 2022-10-28 NOTE — Progress Notes (Signed)
Speech Language Pathology Daily Session Note  Patient Details  Name: Frank Barron MRN: [id number] Date of Birth: 11/24/1950  Today's Date: 10/28/2022 SLP Individual Time: 6213-0865 SLP Individual Time Calculation (min): 40 min  Short Term Goals: Week 1: SLP Short Term Goal 1 (Week 1): Patient will utilize compensatory strategies to consume dys2 / thin liquids with no overt s/sx of aspiration with maxA SLP Short Term Goal 2 (Week 1): Patient will utilize multimodal means of communication to express wants/needs with maxA SLP Short Term Goal 3 (Week 1): Patient will participate in automatic speech tasks with 25% accuracy and max multimodal A SLP Short Term Goal 4 (Week 1): Patient will increase word finding of common objects using compensatory strategies with max multimodal A SLP Short Term Goal 5 (Week 1): Patinet will demonstrate sustained attention with max multimodal A for 5 minutes during functional tasks  Skilled Therapeutic Interventions: Skilled treatment session focused on dysphagia and communication goals. Upon arrival, patient was completing his breakfast meal of Dys. 2 textures with thin liquids. SLP facilitated session by providing Mod A verbal cues for use of small sips with thin liquids via cup resulting in intermittent, subtle throat clear X2. SLP performed oral care via the suction toothbrush after the meal with minimal residuals removed from his oral cavity. Recommend patient continue with current diet. SLP also facilitated session by providing Max A sentence completion and phonemic cues for naming of functional items with 45% accuracy and perseveration on the word "white." During informal conversation, patient utilized gestures to communicate biographical information regarding number of children, etc. Patient communicated best with use of yes/no questions. Patient left upright in bed with alarm on and all needs within reach. Continue with current plan of care.      Pain No/Denies  Pain   Therapy/Group: Individual Therapy  ,  10/28/2022, 2:38 PM

## 2022-10-28 NOTE — Progress Notes (Signed)
PROGRESS NOTE   Subjective/Complaints: Patient more lethargic with therapies this a.m., is responsive to verbal stimuli with multiple stimulation and no obvious focal neurologic deficits.  Did have pain and difficulty bearing weight through right lower extremity this a.m. due to increasing tone  Cr stable, BUN and Na improved, POs excellent.  Vitals stable Large BM yesterday 1x ISC overnight - increasing flomax to 0.8 mg  ROS: Difficult to ascertain due to aphasia  Objective:   No results found. Recent Labs    10/25/22 1431 10/27/22 1006  WBC 5.9 4.2  HGB 14.5 14.4  HCT 42.4 43.0  PLT 131* 165   Recent Labs    10/25/22 1431 10/27/22 1006  NA 132* 134*  K 4.5 4.2  CL 103 104  CO2 17* 20*  GLUCOSE 119* 102*  BUN 24* 20  CREATININE 1.41* 1.43*  CALCIUM 8.9 9.3    Intake/Output Summary (Last 24 hours) at 10/28/2022 0959 Last data filed at 10/28/2022 0820 Gross per 24 hour  Intake 658 ml  Output 250 ml  Net 408 ml        Physical Exam: Vital Signs Blood pressure (!) 152/91, pulse (!) 55, temperature 97.8 F (36.6 C), temperature source Oral, resp. rate 18, height 6\' 3"  (1.905 m), weight 97.6 kg, SpO2 100%.  General: No apparent distress.  Laying on his side in bed, working with OT.Marland Kitchen  HEENT: Head is normocephalic, atraumatic, PERRLA, EOMI; requiring lots of stimulation to attend to the right Neck: Supple without JVD or lymphadenopathy Heart: Reg rate and rhythm. No murmurs rubs or gallops Chest: CTA bilaterally without wheezes, rales, or rhonchi; no distress Abdomen: Soft, non-tender, non-distended, bowel sounds positive. Extremities: No clubbing, cyanosis, or edema.    Psych: Flat affect Skin: Clean and intact without signs of breakdown Neuro: Alert, minimal verbalizations this a.m. + expressive greater than receptive aphasia,  CN: + right facial droop, decreased shoulder shrug on the  right  Strength 5 out of 5 in left upper extremity left lower extremity Strength 0 out of 5 throughout right upper extremity and right lower extremity Sensation to light touch decreased in right upper extremity and right lower extremity  Musculoskeletal:  MAS 1 R elbow flexors, shoulder adductors MAS 3 R hamstrings and right plantar flexors       Assessment/Plan: 1. Functional deficits which require 3+ hours per day of interdisciplinary therapy in a comprehensive inpatient rehab setting. Physiatrist is providing close team supervision and 24 hour management of active medical problems listed below. Physiatrist and rehab team continue to assess barriers to discharge/monitor patient progress toward functional and medical goals  Care Tool:  Bathing    Body parts bathed by patient: Chest, Abdomen, Right upper leg, Left upper leg, Face   Body parts bathed by helper: Right arm, Front perineal area, Buttocks, Right lower leg, Left lower leg     Bathing assist Assist Level: Maximal Assistance - Patient 24 - 49%     Upper Body Dressing/Undressing Upper body dressing   What is the patient wearing?: Pull over shirt    Upper body assist Assist Level: Maximal Assistance - Patient 25 - 49%    Lower Body Dressing/Undressing  Lower body dressing      What is the patient wearing?: Incontinence brief, Pants     Lower body assist Assist for lower body dressing: 2 Helpers     Toileting Toileting    Toileting assist Assist for toileting: 2 Helpers     Transfers Chair/bed transfer  Transfers assist     Chair/bed transfer assist level: Dependent - mechanical lift     Locomotion Ambulation   Ambulation assist   Ambulation activity did not occur: Safety/medical concerns          Walk 10 feet activity   Assist  Walk 10 feet activity did not occur: Safety/medical concerns        Walk 50 feet activity   Assist Walk 50 feet with 2 turns activity did not occur:  Safety/medical concerns         Walk 150 feet activity   Assist Walk 150 feet activity did not occur: Safety/medical concerns         Walk 10 feet on uneven surface  activity   Assist Walk 10 feet on uneven surfaces activity did not occur: Safety/medical concerns         Wheelchair     Assist Is the patient using a wheelchair?: Yes Type of Wheelchair: Manual    Wheelchair assist level: Dependent - Patient 0% (pt unable to assist due to strength and tonal deficits)      Wheelchair 50 feet with 2 turns activity    Assist        Assist Level: Dependent - Patient 0%   Wheelchair 150 feet activity     Assist      Assist Level: Dependent - Patient 0%   Blood pressure (!) 152/91, pulse (!) 55, temperature 97.8 F (36.6 C), temperature source Oral, resp. rate 18, height 6\' 3"  (1.905 m), weight 97.6 kg, SpO2 100%.  Medical Problem List and Plan: 1. 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 Pt has hx of prior CVA with residual aphasia.              -patient may shower             -ELOS/Goals: 14-18, PT/OT/SLP min A to sup             -Order PRAFO/WHO -utilize nightly             -stable for CIR  - 8/15 cognitive status mildly reduced from yesterday, is still within normal limits from admission given documentation but given CT head 8-12 with questionable IPH, recommended serial repeat, will repeat CT head without contrast today    2.  Antithrombotics: -DVT/anticoagulation:  Pharmaceutical: Xarelto w/breakfast?             -antiplatelet therapy: N/A  3. Pain Management/right sided spasticity:  Tylenol. Local measures.  --Per chart review was on tramadol? Naltrexone? Lidocaine patches?    -8-15: Increase spasticity right lower extremity, add baclofen 10 mg 3 times daily  4. Mood/Behavior/Sleep: LCSW to follow for evaluation and support.              -antipsychotic  agents: N/A  5. Neuropsych/cognition: This patient is not capable of making decisions on his own behalf.  6. Skin/Wound Care: Routine pressure relief measures  7. Fluids/Electrolytes/Nutrition: Monitor I/O. Check CMET in am             --assist with meals  8-15: Labs improved, discontinue IV fluid  50 cc/h  8. HTN: Home meds Amlodipine 5mg , Hydralazine 25mg  daily,  goal SBP < 180, long term goal normotensive -Monitor BP TID continue amlodipine - stable 8-15: Has been mostly normotensive, will reduce amlodipine back to 5 mg as perfusion difficulty may be contributing to cognitive difficulties at this time. Vitals:   10/27/22 1952 10/28/22 0405  BP: (!) 122/91 (!) 152/91  Pulse: 68 (!) 55  Resp: 18 18  Temp: 98.6 F (37 C) 97.8 F (36.6 C)  SpO2: 100% 100%    9. A fib: Monitor HR TID. On xarelto. Heart rate controlled  10.  Thrombocytopenia: Recheck platelets in am. Monitor for signs of bleeding. 11. Dysphagia: On D2,thins. Encourage fluid intake   - Diet eval this AM -doing very well with p.o. intakes  12. Acute on chronic renal failure:  SCr 1.32 at admission.  --Continue to monitor and encourage intake  - uptrending on last labs 8/12, encourage fluids, BMP in AM  13. Hyponatremia: Na dropped to 132 in 48 hours. Recheck in am   - 8-15: Stable 1 32-1 35, monitor   14.  H/o CAD s/p PCI: Monitor for symptoms with increase in activity.             --continue Crestor.   15.  Situational depression: Lexapro 10mg  added on acute.    - Also benefit for motor recovery of UE; would not adjust  16. Hx of multiple cancers: R-RCC s/p nephrectomy 2015.  -Right adrenal pheochromocytoma  s/p right adrenalectomy 2017.  -Prostate CA s/p IMRT 2018.  Continue flomax -RUL nodule being monitored.  17. H/o Emphysema?: Noted on CT chest on care everywhere.    - stable  18. HLD. Continue statin  19. Substance abuse. Encourage cessation  20.  Urinary retention.  On Flomax 0.4 mg at  baseline, increased to 0.8 mg today    LOS: 2 days A FACE TO FACE EVALUATION WAS PERFORMED  Angelina Sheriff 10/28/2022, 9:59 AM

## 2022-10-28 NOTE — Progress Notes (Signed)
Physical Therapy Session Note  Patient Details  Name: Frank Barron MRN: [id number] Date of Birth: February 25, 1951  Today's Date: 10/28/2022 PT Individual Time: 1302-1420 PT Individual Time Calculation (min): 78 min   Short Term Goals: Week 1:  PT Short Term Goal 1 (Week 1): Pt will complete supine to sit EOB min assist PT Short Term Goal 2 (Week 1): Pt will complete sit<>stand mod assist PT Short Term Goal 3 (Week 1): Pt will complete bed<>WC transfer with mod assist and LRAD PT Short Term Goal 4 (Week 1): Pt will initiate WC mobility training  Skilled Therapeutic Interventions/Progress Updates:    Pt presents in room in bed, unable to report pain but does not demonstrate pain behaviors with RLE movement. Pt agreeable to PT via head nod. Session focused on bed mobility training, therapeutic activities to promote upright tolerance and RLE mobility.  Pt able to demonstrate trace activation for RLE movement for hip flexion, knee flexion, hip abduction. Therapist completes passive ROM x10 reps with overpressure cues for activation. Pt then completes rolling bilaterally, max assist with max verbal cues for sequencing for placing maxi move pad. Pt 2+ total assist transfer via maxi move bed to [facility], increased time for placement and management of IV. Pt transported via WC to day room and +2 total assist transfer via maxi move to tilt table, increased time for management of IV and positioning on tilt table. Pt tolerates 20* , tolerates 30* for 2.35min. Vitals assessed and noted below. Pt with no increase in pain, pt does demonstrate PF tone on RLE, unable to decrease to neutral with weightbearing however LLE ankle improves to neutral with weightbearing. Pt with no notable pain avoidance during weightbearing. Pt +2 total assist via maxi move transfer back to [facility] and transported back to room.   Pt attempt to stand to [facility] x3 however pt demonstrating vocal outburst of pain, unable to stand from [facility]. Pt +2  transfer back to bed via maxi move and completes rolling max assist for removing maxi move sling. Pt remains in supine with all needs within reach, call light in place, and bed alarm activated at end of session.  Vitals: On tilt table 20*: BP 108/76, HR 79 On tilt table 30*: BP 96/75, HR 69  Therapy Documentation Precautions:  Precautions Precautions: Fall Precaution Comments: R hemiplegia, aphasia (inconsistent yes/no) Restrictions Weight Bearing Restrictions: No   Therapy/Group: Individual Therapy  Edwin Cap PT, DPT 10/28/2022, 3:49 PM

## 2022-10-29 DIAGNOSIS — I63512 Cerebral infarction due to unspecified occlusion or stenosis of left middle cerebral artery: Secondary | ICD-10-CM | POA: Diagnosis not present

## 2022-10-29 MED ORDER — GABAPENTIN 100 MG PO CAPS
100.0000 mg | ORAL_CAPSULE | Freq: Three times a day (TID) | ORAL | Status: DC
  Administered 2022-10-29 – 2022-11-19 (×55): 100 mg via ORAL
  Filled 2022-10-29 (×63): qty 1

## 2022-10-29 NOTE — Care Management (Signed)
Inpatient Rehabilitation Center Individual Statement of Services  Patient Name:  Frank Barron  Date:  10/29/2022  Welcome to the Inpatient Rehabilitation Center.  Our goal is to provide you with an individualized program based on your diagnosis and situation, designed to meet your specific needs.  With this comprehensive rehabilitation program, you will be expected to participate in at least 3 hours of rehabilitation therapies Monday-Friday, with modified therapy programming on the weekends.  Your rehabilitation program will include the following services:  Physical Therapy (PT), Occupational Therapy (OT), Speech Therapy (ST), 24 hour per day rehabilitation nursing, Therapeutic Recreaction (TR), Psychology, Neuropsychology, Care Coordinator, Rehabilitation Medicine, Nutrition Services, Pharmacy Services, and Other  Weekly team conferences will be held on Tuesdays to discuss your progress.  Your Inpatient Rehabilitation Care Coordinator will talk with you frequently to get your input and to update you on team discussions.  Team conferences with you and your family in attendance may also be held.  Expected length of stay: 3-4 weeks    Overall anticipated outcome: Minimal Assistance  Depending on your progress and recovery, your program may change. Your Inpatient Rehabilitation Care Coordinator will coordinate services and will keep you informed of any changes. Your Inpatient Rehabilitation Care Coordinator's name and contact numbers are listed  below.  The following services may also be recommended but are not provided by the Inpatient Rehabilitation Center:  Driving Evaluations Home Health Rehabiltiation Services Outpatient Rehabilitation Services Vocational Rehabilitation   Arrangements will be made to provide these services after discharge if needed.  Arrangements include referral to agencies that provide these services.  Your insurance has been verified to be:  [employer]  Your primary doctor  is:  [facility]  Pertinent information will be shared with your doctor and your insurance company.  Inpatient Rehabilitation Care Coordinator:  Susie Cassette [phone] or [phone]  Information discussed with and copy given to patient by: Gretchen Short, 10/29/2022, 12:29 PM

## 2022-10-29 NOTE — Progress Notes (Signed)
Patient ID: Frank Barron, male   DOB: 1951/02/17, 72 y.o.   MRN: [id number]   SW spoke with pt brother Frank Barron to inform on ELOS, and complete assessment. Reports pt dtr is here in the room as well, and wants to speak with SW.   SW met with pt and pt dtr Frank Barron in room to inform on ELOS. SW explained discharge process, discharge plan, and family education once a d/c date is established. SW will follow-up with updates after team conference.    Cecile Sheerer, MSW, LCSWA Office: [phone] Cell: [phone] Fax: [phone]

## 2022-10-29 NOTE — Progress Notes (Signed)
Physical Therapy Session Note  Patient Details  Name: Frank Barron MRN: [id number] Date of Birth: 1950-08-18  Today's Date: 10/29/2022 PT Individual Time: 7829-5621 PT Individual Time Calculation (min): 70 min   Short Term Goals: Week 1:  PT Short Term Goal 1 (Week 1): Pt will complete supine to sit EOB min assist PT Short Term Goal 2 (Week 1): Pt will complete sit<>stand mod assist PT Short Term Goal 3 (Week 1): Pt will complete bed<>WC transfer with mod assist and LRAD PT Short Term Goal 4 (Week 1): Pt will initiate WC mobility training  Skilled Therapeutic Interventions/Progress Updates:    Pt presents in room in bed, agreeable to PT with head nod, unable to report pain secondary to aphasia however pt in no visible distress. Session focused on bed v mobility, transfer training, and tolerance to upright. Pt demonstrates improved vocalizations this session as well as improved RLE tone, able to demonstrate improved range for AROM with RLE and improved activation for all muscle groups in RLE. Pt also demonstrates improved seated balance and sequencing for rolling and supine<>sit.  Pt completes supine to sit with min assist for RLE mgmt, good immediate sitting balance, CGA initially progress to close supervision. Bed elevated and pt completes stand to stedy with max assist x2 with pt demonstrating significant R lateral lean. Pt completes x2 stands with excessive trunk flexion in standing immediate sit back to bed. Pt does not demonstrate pain behaviors with sit to stand. Pt completes 3rd trial sit stand requires max assist to maintain upright and 2nd person to place posterior pads on Stedy. Pt remains upright either propped in Stedy or standing for ~15 minutes for tolerating upright. Pt able to stand with max assist and verbal cues for terminal hip extension, improved equal weight distribution with BLEs noted, maintains standing with mod assist while 2nd person assist with brief change and  periarea hygiene due to incontinent urine and smear of BM, charted. Pt completes one additional stand to removal pads with totals assist x2 due to excessive R lateral lean.  Once seated on EOB pt indicates that he has dizziness. Vitals assessed and shown below. Pt completes x3 scoots to L with max assist x2 to position towards [facility] prior to returning to supine. Pt requires max assist x2 for trunk and BLE management. Pt completes rolling bilaterally min assist to R, max assist to L for placing chuck pad.  Pt then completes repetitions rolling R/L x5 each with verbal cues for sequencing to promote improved muscle fiber recruitment and sequencing for bed mobility. Pt completes bridging x10 with pt able to place R knee into flexion, max assist for maintaining RLE positioning for bridging.  Pt remains supine at end of session, positioned with pillow beneath RUE and BLEs for improved positioning and decrease risk for skin breakdown. Pt able to point to indicate needs at end of session and all needs placed within reach, call light in place at end of session.  Vitals: Following standing 4 minutes: BP 81/62 After return to supine: BP 99/80  Therapy Documentation Precautions:  Precautions Precautions: Fall Precaution Comments: R hemiplegia, aphasia (inconsistent yes/no) Restrictions Weight Bearing Restrictions: No    Therapy/Group: Individual Therapy  Edwin Cap PT, DPT 10/29/2022, 11:03 AM

## 2022-10-29 NOTE — Progress Notes (Signed)
Inpatient Rehabilitation Care Coordinator Assessment and Plan Patient Details  Name: Frank Barron MRN: [id number] Date of Birth: July 13, 1950  Today's Date: 10/29/2022  Hospital Problems: Principal Problem:   Acute ischemic left middle cerebral artery (MCA) stroke (HCC) Active Problems:   Acute renal failure superimposed on chronic kidney disease (HCC)   Hyponatremia  Past Medical History:  Past Medical History:  Diagnosis Date   Anemia    Anxiety    Arthritis    Atrial fibrillation (HCC)    CAD (coronary artery disease)    PCI to LAD 2012   CHF (congestive heart failure) (HCC)    Chronic back pain    Depression    [name] 07/19/2005 (09/30/2012)   H/O ETOH abuse    Hepatitis C    pt denies this hx on 09/30/2012   High cholesterol    History of DVT of lower extremity    "left; long time ago" (09/30/2012)   Hypertension    OA (osteoarthritis) of hip    moderate to severe Right hip   Prostate cancer (HCC)    Renal cell carcinoma (HCC)    Stroke (HCC) 11/18/2011   [name] 07/31/2012; expressive aphasia noted on 09/30/2012   Past Surgical History:  Past Surgical History:  Procedure Laterality Date   COLONOSCOPY N/A 10/01/2012   Procedure: COLONOSCOPY;  Surgeon: Louis Meckel, MD;  Location: [facility] ENDOSCOPY;  Service: Endoscopy;  Laterality: N/A;   CORONARY ANGIOPLASTY WITH STENT PLACEMENT     ESOPHAGOGASTRODUODENOSCOPY N/A 10/01/2012   Procedure: ESOPHAGOGASTRODUODENOSCOPY (EGD);  Surgeon: Louis Meckel, MD;  Location: [facility] ENDOSCOPY;  Service: Endoscopy;  Laterality: N/A;   EYE SURGERY Right 1975   /notes 07/19/2005; "injured playing football" (09/29/2012)   GIVENS CAPSULE STUDY N/A 10/02/2012   Procedure: GIVENS CAPSULE STUDY;  Surgeon: Louis Meckel, MD;  Location: [facility] ENDOSCOPY;  Service: Endoscopy;  Laterality: N/A;   IR ANGIO VERTEBRAL SEL SUBCLAVIAN INNOMINATE UNI L MOD SED  10/22/2022   IR CT HEAD LTD  10/22/2022   IR CT HEAD LTD  10/22/2022   IR PERCUTANEOUS ART  THROMBECTOMY/INFUSION INTRACRANIAL INC DIAG ANGIO  10/22/2022   JOINT REPLACEMENT  01/03/2007   LUMBAR DISC SURGERY  2001   herniated disc/notes 07/19/2005 (09/30/2012)   NEPHRECTOMY  12/26/2013   Laparascopic , [facility]   RADIOLOGY WITH ANESTHESIA N/A 10/22/2022   Procedure: IR WITH ANESTHESIA;  Surgeon: Radiologist, Medication, MD;  Location: [facility] OR;  Service: Radiology;  Laterality: N/A;   TOTAL HIP ARTHROPLASTY Left 01/03/2007   [name] 01/03/2007 (09/30/2012)   TOTAL KNEE ARTHROPLASTY Left 09/12/2014   Procedure: LEFT TOTAL KNEE ARTHROPLASTY;  Surgeon: Tarry Kos, MD;  Location: [facility] OR;  Service: Orthopedics;  Laterality: Left;   Social History:  reports that he has never smoked. He has never used smokeless tobacco. He reports current alcohol use. He reports that he does not use drugs.  Family / Support Systems Marital Status: Married How Long?: 7 years Patient Roles: Spouse Spouse/Significant Other: Gavin Pound (wife)- currently incapacitated due to emergency surgery Children: 2 children- [name] (dtr; lives in [place] and flies back over the weekend), and Trauis Gatchell (lives in [place]) Other Supports: siblings Anticipated Caregiver: TBD Ability/Limitations of Caregiver: Discharge location and support pending on level of care needed at discharge. 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 Guardian/Conservator: N/A   Abuse/Neglect Abuse/Neglect Assessment Can Be Completed: Unable to assess, patient is non-responsive or altered mental status (pt is aphasic) Physical Abuse:  Denies Verbal Abuse: Denies Sexual Abuse: Denies Exploitation of patient/patient's resources: Denies Self-Neglect: Denies  Patient response to: Social Isolation - How often do you feel lonely or isolated from those around you?: Patient unable to respond  Emotional Status Pt's affect, behavior and adjustment status: Pt in good spirits, however due to aphasia unable to complete assessment with pt Recent Psychosocial Issues: pt brother denies Psychiatric History: pt brother denies Substance Abuse History: Pt brother reports he quit etoh and aware of a hx of rec drug use. Not sure if this is current. Denies tobacco product use.  Patient / Family Perceptions, Expectations & Goals Pt/Family understanding of illness & functional limitations: Pt family has a general understanding of care needs Premorbid pt/family roles/activities: Independent Anticipated changes in roles/activities/participation: Assistance with ADL/sIADLs Pt/family expectations/goals: pt brothers goal is to get better, regain strength on R side, and improve speech.  Community Resources [employer]: None Premorbid Home Care/DME Agencies: None Transportation available at discharge: TBD Is the patient able to respond to transportation needs?: Yes In the past 12 months, has lack of transportation kept you from medical appointments or from getting medications?: No In the past 12 months, has lack of transportation kept you from meetings, work, or from getting things needed for daily living?: No Resource referrals recommended: Neuropsychology  Discharge Planning Living Arrangements: Spouse/significant other Support Systems: Spouse/significant other, Other relatives Type of Residence: Private residence Insurance Resources: Media planner (specify) ([employer] and [employer]) Financial Resources: Restaurant manager, fast food Screen Referred: No Living Expenses: Rent Money Management: Spouse Does the patient have any  problems obtaining your medications?: No Home Management: Pt and wife managed home care needs Patient/Family Preliminary Plans: TBD Care Coordinator Barriers to Discharge: Decreased caregiver support, Lack of/limited family support, Insurance for SNF coverage Care Coordinator Anticipated Follow Up Needs: HH/OP Expected length of stay: 3-4 weeks  Clinical Impression Assessment completed by collateral reports. Unknown DME .   Deborah Lazcano A Jakaree Pickard 10/29/2022, 2:23 PM

## 2022-10-29 NOTE — Progress Notes (Signed)
PROGRESS NOTE   Subjective/Complaints: More awake, alert this a.m.  Initially attempts to answer questions by nodding head, but when encouraged to verbalize, is able to give intermittent appropriate verbalizations.  Remains very limited by aphasia.  Can follow simple commands.  Overall, no complaints today.  Right lower extremity remains extremely tight, patient does cry out when bottom of right foot is palpated.  He nods his head when asked if it is painful.  ROS: Difficult to ascertain due to aphasia; see HPI above  Objective:   CT HEAD WO CONTRAST ( )  Result Date: 10/28/2022 CLINICAL DATA:  Follow-up examination for stroke. Mental status change. EXAM: CT HEAD WITHOUT CONTRAST TECHNIQUE: Contiguous axial images were obtained from the base of the skull through the vertex without intravenous contrast. RADIATION DOSE REDUCTION: This exam was performed according to the departmental dose-optimization program which includes automated exposure control, adjustment of the mA and/or kV according to patient size and/or use of iterative reconstruction technique. COMPARISON:  Prior CT from 10/25/2022 as well as earlier studies. FINDINGS: Brain: Evolving left MCA territory infarct again seen, most pronounced at the left basal ganglia, relatively similar to previous MRI. Persistent hyperdensity at the left sylvian fissure, similar to prior. Given persistence, finding is consistent with small volume subarachnoid hemorrhage. Overall, this is slightly decreased from prior. Additional small volume subarachnoid noted superiorly at the high left frontal region (series 3, image 24). No other acute intracranial hemorrhage. No other acute large vessel territory infarct. No mass lesion or midline shift. No hydrocephalus or extra-axial fluid collection. Vascular: Persistent hyperdensity within the left MCA, suggesting thrombus and/or diminished flow, similar to  prior. Scattered calcified atherosclerosis about the skull base. Skull: Scalp soft tissues demonstrate no acute finding. Calvarium intact. Sinuses/Orbits: Prior scleral buckle on the right. Paranasal sinuses are largely clear. Trace left mastoid effusion, of doubtful significance. Other: None. IMPRESSION: 1. Evolving left MCA territory infarct, relatively stable in size and distribution from prior. 2. Persistent hyperdensity at the left sylvian fissure and high left frontal lobe, consistent with small volume subarachnoid hemorrhage. 3. Persistent hyperdensity within the left MCA, suggesting thrombus and/or diminished flow, similar to prior. 4. No other new acute intracranial abnormality. Electronically Signed   By: Rise Mu M.D.   On: 10/28/2022 21:18   Recent Labs    10/27/22 1006 10/28/22 0942  WBC 4.2 5.1  HGB 14.4 14.2  HCT 43.0 42.0  PLT 165 156   Recent Labs    10/27/22 1006 10/28/22 0942  NA 134* 133*  K 4.2 4.3  CL 104 103  CO2 20* 21*  GLUCOSE 102* 97  BUN 20 22  CREATININE 1.43* 1.27*  CALCIUM 9.3 9.1    Intake/Output Summary (Last 24 hours) at 10/29/2022 1509 Last data filed at 10/29/2022 1242 Gross per 24 hour  Intake 632 ml  Output --  Net 632 ml        Physical Exam: Vital Signs Blood pressure 107/77, pulse 75, temperature 98.2 F (36.8 C), resp. rate 18, height 6\' 3"  (1.905 m), weight 97.6 kg, SpO2 100%.  General: No apparent distress.  Laying on his side in bed, working with OT.Marland Kitchen  HEENT: Head is  normocephalic, atraumatic, PERRLA, EOMI neck: Supple without JVD or lymphadenopathy Heart: Reg rate and rhythm. No murmurs rubs or gallops Chest: CTA bilaterally without wheezes, rales, or rhonchi; no distress Abdomen: Soft, non-tender, non-distended, bowel sounds positive. Extremities: No clubbing, cyanosis, or edema.    Psych: Flat affect Skin: Clean and intact without signs of breakdown Neuro: Orientation assessment limited by aphasia, is oriented  to self and place as hospital.  Can get month with options, not year. + expressive greater than receptive aphasia,  + Right hemineglect CN: + right facial droop, decreased shoulder shrug on the right  Strength 5 out of 5 in left upper extremity left lower extremity Strength 0 out of 5 throughout right upper extremity and right lower extremity Sensation to light touch decreased in right upper extremity and right lower extremity  Musculoskeletal:  MAS 1 R elbow flexors, shoulder adductors MAS 3 R hamstrings and right plantar flexors -unchanged       Assessment/Plan: 1. Functional deficits which require 3+ hours per day of interdisciplinary therapy in a comprehensive inpatient rehab setting. Physiatrist is providing close team supervision and 24 hour management of active medical problems listed below. Physiatrist and rehab team continue to assess barriers to discharge/monitor patient progress toward functional and medical goals  Care Tool:  Bathing    Body parts bathed by patient: Chest, Abdomen, Right upper leg, Left upper leg, Face   Body parts bathed by helper: Right arm, Front perineal area, Buttocks, Right lower leg, Left lower leg     Bathing assist Assist Level: 2 Helpers     Upper Body Dressing/Undressing Upper body dressing   What is the patient wearing?: Pull over shirt    Upper body assist Assist Level: Maximal Assistance - Patient 25 - 49%    Lower Body Dressing/Undressing Lower body dressing      What is the patient wearing?: Pants, Incontinence brief     Lower body assist Assist for lower body dressing: 2 Helpers     Toileting Toileting    Toileting assist Assist for toileting: 2 Helpers     Transfers Chair/bed transfer  Transfers assist  Chair/bed transfer activity did not occur: Safety/medical concerns  Chair/bed transfer assist level: Dependent - mechanical lift     Locomotion Ambulation   Ambulation assist   Ambulation activity did  not occur: Safety/medical concerns          Walk 10 feet activity   Assist  Walk 10 feet activity did not occur: Safety/medical concerns        Walk 50 feet activity   Assist Walk 50 feet with 2 turns activity did not occur: Safety/medical concerns         Walk 150 feet activity   Assist Walk 150 feet activity did not occur: Safety/medical concerns         Walk 10 feet on uneven surface  activity   Assist Walk 10 feet on uneven surfaces activity did not occur: Safety/medical concerns         Wheelchair     Assist Is the patient using a wheelchair?: Yes Type of Wheelchair: Manual    Wheelchair assist level: Dependent - Patient 0% (pt unable to assist due to strength and tonal deficits)      Wheelchair 50 feet with 2 turns activity    Assist        Assist Level: Dependent - Patient 0%   Wheelchair 150 feet activity     Assist  Assist Level: Dependent - Patient 0%   Blood pressure 107/77, pulse 75, temperature 98.2 F (36.8 C), resp. rate 18, height 6\' 3"  (1.905 m), weight 97.6 kg, SpO2 100%.  Medical Problem List and Plan: 1. 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 Pt has hx of prior CVA with residual aphasia.              -patient may shower             -ELOS/Goals: 14-18, PT/OT/SLP min A to sup             -Order PRAFO/WHO -utilize nightly             -stable for CIR  - 8/15 cognitive status mildly reduced from yesterday, is still within normal limits from admission given documentation but given CT head 8-12 with questionable IPH, recommended serial repeat, will repeat CT head without contrast today  -8-16: CT head without significant changes, doing better cognitively today with encouragement, monitor   2.  Antithrombotics: -DVT/anticoagulation:  Pharmaceutical: Xarelto w/breakfast?             -antiplatelet therapy: N/A  3. Pain  Management/right sided spasticity:  Tylenol. Local measures.  --Per chart review was on tramadol? Naltrexone? Lidocaine patches?    -8-15: Increase spasticity right lower extremity, add baclofen 10 mg 3 times daily -8-16: Pain in right lower extremity, spasticity versus neuropathic, start gabapentin 100 mg 3 times daily.  Check renal function on Monday  4. Mood/Behavior/Sleep: LCSW to follow for evaluation and support.              -antipsychotic agents: N/A  5. Neuropsych/cognition: This patient is not capable of making decisions on his own behalf.  6. Skin/Wound Care: Routine pressure relief measures  7. Fluids/Electrolytes/Nutrition: Monitor I/O. Check CMET in am             --assist with meals  8-15: Labs improved, discontinue IV fluid 50 cc/h; check Monday  8. HTN: Home meds Amlodipine 5mg , Hydralazine 25mg  daily,  goal SBP < 180, long term goal normotensive -Monitor BP TID continue amlodipine - stable 8-15: Has been mostly normotensive, will reduce amlodipine back to 5 mg as perfusion difficulty may be contributing to cognitive difficulties at this time. 8-16: Remains hypotensive today, will DC amlodipine; no current medications  Vitals:   10/29/22 0555 10/29/22 1505  BP: 126/87 107/77  Pulse: 63 75  Resp: 16 18  Temp: 98 F (36.7 C) 98.2 F (36.8 C)  SpO2: 100% 100%    9. A fib: Monitor HR TID. On xarelto. Heart rate controlled  10.  Thrombocytopenia: Recheck platelets in am. Monitor for signs of bleeding.  11. Dysphagia: On D2,thins. Encourage fluid intake   - Diet eval this AM -doing very well with p.o. intakes  12. Acute on chronic renal failure:  SCr 1.32 at admission.  --Continue to monitor and encourage intake  - uptrending on last labs 8/12, encourage fluids, BMP in AM -able, reassess q. Monday  13. Hyponatremia: Na dropped to 132 in 48 hours. Recheck in am   - 8-15: Stable 1 32-1 35, monitor   14.  H/o CAD s/p PCI: Monitor for symptoms with increase in  activity.             --continue Crestor.   15.  Situational depression: Lexapro 10mg  added on acute.    - Also benefit for motor recovery of UE;  would not adjust  16. Hx of multiple cancers: R-RCC s/p nephrectomy 2015.  -Right adrenal pheochromocytoma  s/p right adrenalectomy 2017.  -Prostate CA s/p IMRT 2018.  Continue flomax -RUL nodule being monitored.  17. H/o Emphysema?: Noted on CT chest on care everywhere.    - stable  18. HLD. Continue statin  19. Substance abuse. Encourage cessation  20.  Urinary retention.  On Flomax 0.4 mg at baseline, increased to 0.8 mg today -Remains incontinent but low PVRs, encourage up to bedside and monitor  LOS: 3 days A FACE TO FACE EVALUATION WAS PERFORMED  Angelina Sheriff 10/29/2022, 3:09 PM

## 2022-10-29 NOTE — Progress Notes (Signed)
Occupational Therapy Session Note  Patient Details  Name: Frank Barron MRN: [id number] Date of Birth: [birth year]  Today's Date: 10/29/2022 OT Individual Time: 1300-1345 OT Individual Time Calculation (min): 45 min    Short Term Goals: Week 1:  OT Short Term Goal 1 (Week 1): Pt will don shirt with minA OT Short Term Goal 2 (Week 1): Pt will complete sit <> stand with LUE support, mod A OT Short Term Goal 3 (Week 1): Pt will complete LB dressing with max A +1  Skilled Therapeutic Interventions/Progress Updates:    Pt greeted semi-reclined in bed with daughter present and MUCH more alert than yesterday. Pt agreeable to BADL tasks. Pt needed max A for bed mobility but demonstrated much improved sitting balance with mostly close supervision with intermittent min A when removing L UE support for BADL tasks. UB bathing/dressing from EOB with hand over hand A to integrate R UE into bathing. OT educated on hemi dressing techniques but unable follow these commands. Worked on pushing and pulling with joint input through wrist and elbow, but no activation noted. Sit<>stands at EOB using St[place] with max A +2 and strong push to the R. With fax facilitation and outside cues, pt unable to shift weight to the L in order to even place [name]. Pt completed 5 sit<>stands in [place] with the same issues each time. Pt returned to supine with total A +2. OT placed SAEBO e-stim on wrist extensors. SAEBO left on for 60 minutes. OT returned to remove SAEBO with skin intact and no adverse reactions.  [place] Stim One 330 pulse width 35 Hz pulse rate On 8 sec/ off 8 sec Ramp up/ down 2 sec Symmetrical Biphasic wave form  Max intensity at 500 Ohm load  Pt handoff to PT for next therapy session.   Therapy Documentation Precautions:  Precautions Precautions: Fall Precaution Comments: R hemiplegia, aphasia (inconsistent yes/no) Restrictions Weight Bearing Restrictions: No  Pain: No number given. Indicates  pain in standing with strong push to the R. 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    Therapy/Group: Individual Therapy  [name] 10/29/2022, 1:51 PM

## 2022-10-29 NOTE — Progress Notes (Signed)
Referring Physician(s): Dr Lina Sayre  Supervising Physician: Julieanne Cotton  Patient Status:  [facility] - In-pt  Chief Complaint:  CVA  Subjective:  L MCA revascularization 8/9 In Rehab now Working with PT--- standing with help; scooting in bed Speech is better- answers all questions correctly Some words hard to understand Using left well Rt leg movement- but none on Rt hand/arm  Allergies: Patient has no known allergies.  Medications: Prior to Admission medications   Medication Sig Start Date End Date Taking? Authorizing Provider  amLODipine (NORVASC) 10 MG tablet Take 1 tablet (10 mg total) by mouth daily. 10/27/22   Kara Mead, NP  diclofenac Sodium (VOLTAREN ARTHRITIS PAIN) 1 % GEL Apply 2 g topically as needed.    [provider]  escitalopram (LEXAPRO) 10 MG tablet Take 1 tablet (10 mg total) by mouth daily. 10/27/22   Kara Mead, NP  lidocaine (LIDODERM) 5 % Place 1 patch onto the skin daily.    [provider]  loratadine (CLARITIN) 10 MG tablet Take 10 mg by mouth daily.    [provider]  Multiple Vitamin (MULTIVITAMIN ADULT PO) Take 1 tablet by mouth daily.    [provider]  naltrexone (DEPADE) 50 MG tablet Take 50 mg by mouth daily.    [provider]  rivaroxaban (XARELTO) 20 MG TABS tablet Take 1 tablet (20 mg total) by mouth daily with breakfast. 10/27/22   Kara Mead, NP  rosuvastatin (CRESTOR) 40 MG tablet Take 1 tablet (40 mg total) by mouth daily. 10/27/22   Kara Mead, NP  tamsulosin (FLOMAX) 0.4 MG CAPS capsule Take 1 capsule (0.4 mg total) by mouth daily. 10/27/22   Kara Mead, NP  thiamine (VITAMIN B-1) 100 MG tablet Take 100 mg by mouth daily.    [provider]     Vital Signs: BP 126/87 (BP Location: Right Arm)   Pulse 63   Temp 98 F (36.7 C)   Resp 16   Ht 6\' 3"  (1.905 m)   Wt 215 lb 2.7 oz (97.6 kg)   SpO2 100%   BMI 26.89 kg/m   Physical  Exam Vitals reviewed.  Constitutional:      Comments: Tries smile--- still left face droop Tongue not quite midline  Musculoskeletal:     Comments: Moving left arm/leg well Moving Rt leg to command No movement Rt hand/arm Standing with help  Communicating- speech better   Skin:    General: Skin is warm.  Neurological:     Mental Status: He is alert.     Imaging: CT HEAD WO CONTRAST ( )  Result Date: 10/28/2022 CLINICAL DATA:  Follow-up examination for stroke. Mental status change. EXAM: CT HEAD WITHOUT CONTRAST TECHNIQUE: Contiguous axial images were obtained from the base of the skull through the vertex without intravenous contrast. RADIATION DOSE REDUCTION: This exam was performed according to the departmental dose-optimization program which includes automated exposure control, adjustment of the mA and/or kV according to patient size and/or use of iterative reconstruction technique. COMPARISON:  Prior CT from 10/25/2022 as well as earlier studies. FINDINGS: Brain: Evolving left MCA territory infarct again seen, most pronounced at the left basal ganglia, relatively similar to previous MRI. Persistent hyperdensity at the left sylvian fissure, similar to prior. Given persistence, finding is consistent with small volume subarachnoid hemorrhage. Overall, this is slightly decreased from prior. Additional small volume subarachnoid noted superiorly at the high left frontal region (series 3, image 24). No other acute  intracranial hemorrhage. No other acute large vessel territory infarct. No mass lesion or midline shift. No hydrocephalus or extra-axial fluid collection. Vascular: Persistent hyperdensity within the left MCA, suggesting thrombus and/or diminished flow, similar to prior. Scattered calcified atherosclerosis about the skull base. Skull: Scalp soft tissues demonstrate no acute finding. Calvarium intact. Sinuses/Orbits: Prior scleral buckle on the right. Paranasal sinuses are largely clear.  Trace left mastoid effusion, of doubtful significance. Other: None. IMPRESSION: 1. Evolving left MCA territory infarct, relatively stable in size and distribution from prior. 2. Persistent hyperdensity at the left sylvian fissure and high left frontal lobe, consistent with small volume subarachnoid hemorrhage. 3. Persistent hyperdensity within the left MCA, suggesting thrombus and/or diminished flow, similar to prior. 4. No other new acute intracranial abnormality. Electronically Signed   By: Rise Mu M.D.   On: 10/28/2022 21:18   EEG adult  Result Date: 10/25/2022 Charlsie Quest, MD     10/25/2022  4:41 PM Patient Name: Frank Barron MRN: [id number] Epilepsy Attending: Charlsie Quest Referring Physician/Provider: Mathews Argyle, NP Date: 10/25/2022 Duration: 25.25 mins Patient history: 72yo M with Left MCA stroke getting eeg to evaluate for seizure. Level of alertness: Awake, drowsy AEDs during EEG study: None Technical aspects: This EEG study was done with scalp electrodes positioned according to the 10-20 International system of electrode placement. Electrical activity was reviewed with band pass filter of 1-70Hz , sensitivity of 7 uV/mm, display speed of 21mm/sec with a 60Hz  notched filter applied as appropriate. EEG data were recorded continuously and digitally stored.  Video monitoring was available and reviewed as appropriate. Description:  The posterior dominant rhythm consists of 8 Hz activity of moderate voltage (25-35 uV) seen predominantly in posterior head regions, asymmetric ( left<right) and reactive to eye opening and eye closing.  Drowsiness was characterized by attenuation of posterior dominant rhythm. EEG showed continuous 3 to 6 Hz theta-delta slowing in left hemisphere as well as intermittent generalized 3-5Hz  theta-delta slowing. Hyperventilation and photic stimulation were not performed.    ABNORMALITY - Continuous slow, left  hemisphere - Intermittent slow, generalized -  Background asymmetry, left<right  IMPRESSION: This study is suggestive of cortical dysfunction arising from left hemisphere likely secondary to underlying stroke. Additionally there is mild to moderate diffuse encephalopathy. No seizures or epileptiform discharges were seen throughout the recording.  Priyanka Annabelle Harman   CT HEAD WO CONTRAST ( )  Addendum Date: 10/25/2022   ADDENDUM REPORT: 10/25/2022 13:59 ADDENDUM: Alternatively, a CTA head could also be considered for more definitive characterization. Electronically Signed   By: Lorenza Cambridge M.D.   On: 10/25/2022 13:59   Result Date: 10/25/2022 CLINICAL DATA:  Neuro deficit, acute, stroke suspected EXAM: CT HEAD WITHOUT CONTRAST TECHNIQUE: Contiguous axial images were obtained from the base of the skull through the vertex without intravenous contrast. RADIATION DOSE REDUCTION: This exam was performed according to the departmental dose-optimization program which includes automated exposure control, adjustment of the mA and/or kV according to patient size and/or use of iterative reconstruction technique. COMPARISON:  Brain MRI 10/23/2022 FINDINGS: Brain: Evolving left MCA territory infarct. There is a focal 1.2 x 0.8 cm hyperdenseregion in the region of the left sylvian fissure, which is along the course of the left MCA branches, and could represent contrast pooling or a site of parenchymal hemorrhage. No mass effect. No hydrocephalus. No extra-axial fluid collection. Vascular: Diffusely hyperdense appearance of the left MCA involving the M1 through the M3 segments. Skull: Normal. Negative for fracture or focal lesion. Sinuses/Orbits:  No middle ear or mastoid effusion. Paranasal sinuses are clear. Right lens replacement with a scleral band. Other: None. IMPRESSION: 1. Evolving left MCA territory infarct. Focal 1.2 x 0.8 cm hyperdense region in the region of the left Sylvian fissure could represent contrast pooling or a site of parenchymal hemorrhage.  Recommend short term follow-up CT to ensure stability. 2. Diffusely hyperdense appearance of the left MCA involving the M1 through the M3 segments, worrisome for thrombus. These results will be called to the ordering clinician or representative by the Radiologist Assistant, and communication documented in the PACS or [employer]. Electronically Signed: By: Lorenza Cambridge M.D. On: 10/25/2022 13:56    Labs:  CBC: Recent Labs    10/23/22 0618 10/25/22 1431 10/27/22 1006 10/28/22 0942  WBC 5.9 5.9 4.2 5.1  HGB 13.1 14.5 14.4 14.2  HCT 37.9* 42.4 43.0 42.0  PLT 127* 131* 165 156    COAGS: Recent Labs    10/22/22 1016  INR 1.2  APTT 27    BMP: Recent Labs    10/23/22 0618 10/25/22 1431 10/27/22 1006 10/28/22 0942  NA 135 132* 134* 133*  K 4.7 4.5 4.2 4.3  CL 102 103 104 103  CO2 20* 17* 20* 21*  GLUCOSE 105* 119* 102* 97  BUN 15 24* 20 22  CALCIUM 8.6* 8.9 9.3 9.1  CREATININE 1.36* 1.41* 1.43* 1.27*  GFRNONAA 56* 54* 53* >60    LIVER FUNCTION TESTS: Recent Labs    10/27/22 1006  BILITOT 0.9  AST 28  ALT 20  ALKPHOS 43  PROT 7.3  ALBUMIN 3.4*    Assessment and Plan:  CVA L MCA revascularization 8/9 in NIR Will follow For 2-3 week follow up after DC; pt will hear from IR outpt scheduler  Electronically Signed: Robet Leu, PA-C 10/29/2022, 10:40 AM   I spent a total of 15 Minutes at the the patient's bedside AND on the patient's hospital floor or unit, greater than 50% of which was counseling/coordinating care for L MCA revascularization

## 2022-10-29 NOTE — Progress Notes (Signed)
Speech Language Pathology Daily Session Note  Patient Details  Name: Frank Barron MRN: [id number] Date of Birth: 07-06-1950  Today's Date: 10/29/2022 SLP Individual Time: 0800-0900 SLP Individual Time Calculation (min): 60 min  Short Term Goals: Week 1: SLP Short Term Goal 1 (Week 1): Patient will utilize compensatory strategies to consume dys2 / thin liquids with no overt s/sx of aspiration with maxA SLP Short Term Goal 2 (Week 1): Patient will utilize multimodal means of communication to express wants/needs with maxA SLP Short Term Goal 3 (Week 1): Patient will participate in automatic speech tasks with 25% accuracy and max multimodal A SLP Short Term Goal 4 (Week 1): Patient will increase word finding of common objects using compensatory strategies with max multimodal A SLP Short Term Goal 5 (Week 1): [place] will demonstrate sustained attention with max multimodal A for 5 minutes during functional tasks  Skilled Therapeutic Interventions: Skilled therapy session focused on dysphagia and communication goals. Upon entrance, patient delivered breakfast tray. SLP aided in set up and provided min-mod verbal A to utilize small bites/sips during consumption of thin liquids/dys2 solids. Patient with immediate cough after consumption of thin liquids x3-4 times during breakfast. Trialed nectar thick liquids with no overt s/sx of aspiration. Patient gestured appreciation. Recommend continuation of dys2 textures, however downgrade to nectar thick liquids no straw. Continue full supervision and medications crushed in puree. SLP addressed communication through automatic speech tasks and singing. Patient able to name numbers "one, four and five" as well as days of the week "Monday, Wednesday, Thursday" with mod-maxA. SLP provided mod verbal/visual A during singing familiar songs including "twinkle twinkle little star" and "happy birthday." Patient verbalized throughout the song with the correct intonation,  however with ~50% intelligibility. SLP provided max A to encourage gestures to communicate basic needs (eat, drink, pain, bathroom).  Patient able to utilize call bell with minA. Patient left in bed with alarm set and call bell within reach. Continue POC.   Pain No pain reported this session.  Therapy/Group: Individual Therapy  Tobias Avitabile M.A., CF-SLP 10/29/2022, 12:11 PM

## 2022-10-29 NOTE — IPOC Note (Signed)
Overall Plan of Care [facility]) Patient Details Name: Frank Barron MRN: [id number] DOB: Sep 12, 1950  Admitting Diagnosis: Acute ischemic left middle cerebral artery (MCA) stroke [facility])  Hospital Problems: Principal Problem:   Acute ischemic left middle cerebral artery (MCA) stroke (HCC) Active Problems:   Acute renal failure superimposed on chronic kidney disease (HCC)   Hyponatremia     Functional Problem List: Nursing Bowel, Bladder, Endurance, Medication Management, Safety, Nutrition, Pain  PT Balance, Sensory, Endurance, Motor, Pain, Safety, Perception  OT Balance, Cognition, Endurance, Motor, Pain, Perception, Safety, Sensory, Vision  SLP Linguistic  TR         Basic ADL's: OT Eating, Bathing, Grooming, Toileting, Dressing     Advanced  ADL's: OT       Transfers: PT Bed Mobility, Bed to Chair, Car  OT Toilet     Locomotion: PT Ambulation, Wheelchair Mobility, Stairs     Additional Impairments: OT Fuctional Use of Upper Extremity  SLP Swallowing, Communication, Social Cognition expression, comprehension Attention, Awareness  TR      Anticipated Outcomes Item Anticipated Outcome  Self Feeding (S)  Swallowing  minA   Basic self-care  min A  Toileting  min A   Bathroom Transfers min A  Bowel/Bladder  manage bowel and bladder w min assist  Transfers  min assist  Locomotion  min assist ambulation, modI WC mobility  Communication  modA  Cognition     Pain  < 4 with prns  Safety/Judgment  manage w cues   Therapy Plan: PT Intensity: Minimum of 1-2 x/day ,45 to 90 minutes PT Frequency: 5 out of 7 days PT Duration Estimated Length of Stay: 3-4 weeks OT Intensity: Minimum of 1-2 x/day, 45 to 90 minutes OT Frequency: 5 out of 7 days OT Duration/Estimated Length of Stay: 3-4 weeks SLP Intensity: Minumum of 1-2 x/day, 30 to 90 minutes SLP Frequency: 3 to 5 out of 7 days SLP Duration/Estimated Length of Stay: 3-4 weeks   Team Interventions: Nursing  Interventions Disease Management/Prevention, Bladder Management, Medication Management, Discharge Planning, Dysphagia/Aspiration Precaution Training, Bowel Management, Patient/Family Education, Pain Management  PT interventions Ambulation/gait training, DME/adaptive equipment instruction, Stair training, UE/LE Strength taining/ROM, Wheelchair propulsion/positioning, Neuromuscular re-education, Community reintegration, Warden/ranger, Discharge planning, Functional electrical stimulation, Pain management, Therapeutic Activities, UE/LE Coordination activities, Cognitive remediation/compensation, Disease management/prevention, Functional mobility training, Patient/family education, Splinting/orthotics, Therapeutic Exercise, Visual/perceptual remediation/compensation  OT Interventions Balance/vestibular training, Functional electrical stimulation, Self Care/advanced ADL retraining, UE/LE Coordination activities, Cognitive remediation/compensation, Functional mobility training, Skin care/wound managment, Community reintegration, Wheelchair propulsion/positioning, Visual/perceptual remediation/compensation, Neuromuscular re-education, Splinting/orthotics, Discharge planning, Pain management, Therapeutic Activities, Disease mangement/prevention, Patient/family education, Therapeutic Exercise, DME/adaptive equipment instruction, Psychosocial support, UE/LE Strength taining/ROM  SLP Interventions Dysphagia/aspiration precaution training, Internal/external aids, Speech/Language facilitation, Cueing hierarchy, Therapeutic Activities, Functional tasks, Multimodal communication approach, Patient/family education, Therapeutic Exercise  TR Interventions    SW/CM Interventions Discharge Planning, Patient/Family Education, Psychosocial Support   Barriers to Discharge MD  Medical stability, Home enviroment access/loayout, Lack of/limited family support, and Insurance for SNF coverage  Nursing Lack of/limited  family support, Incontinence, Medication compliance Family cannot provide 24/7, will rely on assist from [place] to assist with filling in the caregiver gaps, per [facility] [phone] ext [phone] rehab CSW to call when d/c date set so they can set it up  PT Decreased caregiver support family support? unsure of accessibility of home  OT  (unknown- no family present to confirm)    SLP      SW Decreased caregiver support, Lack of/limited family  support, Community education officer for SNF coverage     Team Discharge Planning: Destination: PT-Home ,OT- Home , SLP-Home Projected Follow-up: PT-Home health PT, OT-  Home health OT, SLP-Home Health SLP Projected Equipment Needs: PT-To be determined, OT- To be determined, SLP-To be determined Equipment Details: PT-TBD based on pt progress, OT-  Patient/family involved in discharge planning: PT- Patient unable/family or caregiver not available,  OT-Patient, SLP-Patient  MD ELOS: 14-18 days Medical Rehab Prognosis:  Good Assessment: The patient has been admitted for CIR therapies with the diagnosis of L MCA CVA. The team will be addressing functional mobility, strength, stamina, balance, safety, adaptive techniques and equipment, self-care, bowel and bladder mgt, patient and caregiver education. Goals have been set at supvervision to min assist PT/OT/SLP. Anticipated discharge destination is home.       See Team Conference Notes for weekly updates to the plan of care

## 2022-10-29 NOTE — Progress Notes (Signed)
Physical Therapy Session Note  Patient Details  Name: Frank Barron MRN: [id number] Date of Birth: [birth year]  Today's Date: 10/29/2022 PT Individual Time: 1345-1415 PT Individual Time Calculation (min): 30 min   Short Term Goals: Week 1:  PT Short Term Goal 1 (Week 1): Pt will complete supine to sit EOB min assist PT Short Term Goal 2 (Week 1): Pt will complete sit<>stand mod assist PT Short Term Goal 3 (Week 1): Pt will complete bed<>WC transfer with mod assist and LRAD PT Short Term Goal 4 (Week 1): Pt will initiate WC mobility training  Skilled Therapeutic Interventions/Progress Updates:    Pt recd as hand off from OT, in bed with saebo in place. Pt indicated fatigue and politely declines OOB mobility. Initiated bed level exercise, but found pt was limited by tone in RLE. Rest of session spent providing passive ROM and stretching for RLE, including hamstrings, calves, internal/external rotation, quads. Pt with increased tone throughout that seemed to improve with stretching but return when pt attempted active movement. Pt's daughter present, therapist provided education on tone and management strategies. Pt dozed off at end of session and remained in bed, was left with all needs in reach and alarm active.   Therapy Documentation Precautions:  Precautions Precautions: Fall Precaution Comments: R hemiplegia, aphasia (inconsistent yes/no) Restrictions Weight Bearing Restrictions: No General:       Therapy/Group: Individual Therapy  [name] 10/29/2022, 3:41 PM

## 2022-10-30 DIAGNOSIS — K5901 Slow transit constipation: Secondary | ICD-10-CM | POA: Diagnosis not present

## 2022-10-30 DIAGNOSIS — I10 Essential (primary) hypertension: Secondary | ICD-10-CM

## 2022-10-30 DIAGNOSIS — I63512 Cerebral infarction due to unspecified occlusion or stenosis of left middle cerebral artery: Secondary | ICD-10-CM | POA: Diagnosis not present

## 2022-10-30 MED ORDER — POLYETHYLENE GLYCOL 3350 17 G PO PACK: 17.0000 g | PACK | Freq: Every day | ORAL | Status: DC | PRN

## 2022-10-30 MED ORDER — SENNOSIDES-DOCUSATE SODIUM 8.6-50 MG PO TABS
1.0000 | ORAL_TABLET | Freq: Every day | ORAL | Status: DC
  Administered 2022-10-30 – 2022-11-01 (×3): 1 via ORAL
  Filled 2022-10-30 (×3): qty 1

## 2022-10-30 MED ORDER — POLYETHYLENE GLYCOL 3350 17 G PO PACK
17.0000 g | PACK | Freq: Every day | ORAL | Status: DC
  Filled 2022-10-30: qty 1

## 2022-10-30 MED ORDER — TRAMADOL HCL 50 MG PO TABS
50.0000 mg | ORAL_TABLET | Freq: Two times a day (BID) | ORAL | Status: DC
  Administered 2022-10-30 – 2022-11-01 (×5): 50 mg via ORAL
  Filled 2022-10-30 (×5): qty 1

## 2022-10-30 NOTE — Progress Notes (Signed)
Physical Therapy Session Note  Patient Details  Name: Frank Barron MRN: [id number] Date of Birth: [birth year]  Today's Date: 10/30/2022 PT Individual Time: 4098-1191 PT Individual Time Calculation (min): 57 min   Short Term Goals: Week 1:  PT Short Term Goal 1 (Week 1): Pt will complete supine to sit EOB min assist PT Short Term Goal 2 (Week 1): Pt will complete sit<>stand mod assist PT Short Term Goal 3 (Week 1): Pt will complete bed<>WC transfer with mod assist and LRAD PT Short Term Goal 4 (Week 1): Pt will initiate WC mobility training  Skilled Therapeutic Interventions/Progress Updates:  Pt was seen bedside in the am. Pt initially reluctant to participate agreed with max encouragement. Pt rolled R/L with side rails and mod A with verbal cues to assist with donning shorts. Pt transferred supine to edge of bed with mod A and verbal cues. BP on edge of bed 96/73. Pt tolerated edge of bed about 20 minutes, while on edge of bed worked on upright posture and weight shifting. Pt stood x 3 with stedy and +2 assist. Pt required max encouragement to stand x 3. Pt's standing tolerance about 15 seconds each stand. Friend at bedside to assist with encouraging participation. Pt transferred edge of bed to supine with mod A and verbal cues. Pt moved up in bed with +2 min A and verbal cues. Pt performed AAROM L LE and PROM R LE, 2 sets x 10 reps each, heel slides, hip abd/add, and SAQs. Pt left sitting up in bed with bed alarm and all needs within reach.   Therapy Documentation Precautions:  Precautions Precautions: Fall Precaution Comments: R hemiplegia, aphasia (inconsistent yes/no) Restrictions Weight Bearing Restrictions: No General:   Pain: No c/o pain.   Therapy/Group: Individual Therapy  [name] 10/30/2022, 12:07 PM

## 2022-10-30 NOTE — Progress Notes (Signed)
Speech Language Pathology Daily Session Note  Patient Details  Name: Frank Barron MRN: [id number] Date of Birth: 1950-04-09  Today's Date: 10/30/2022 SLP Individual Time: 0730-0830 SLP Individual Time Calculation (min): 60 min  Short Term Goals: Week 1: SLP Short Term Goal 1 (Week 1): Patient will utilize compensatory strategies to consume dys2 / thin liquids with no overt s/sx of aspiration with maxA SLP Short Term Goal 2 (Week 1): Patient will utilize multimodal means of communication to express wants/needs with maxA SLP Short Term Goal 3 (Week 1): Patient will participate in automatic speech tasks with 25% accuracy and max multimodal A SLP Short Term Goal 4 (Week 1): Patient will increase word finding of common objects using compensatory strategies with max multimodal A SLP Short Term Goal 5 (Week 1): Patinet will demonstrate sustained attention with max multimodal A for 5 minutes during functional tasks  Skilled Therapeutic Interventions: Pt seen for skilled ST with focus on communication and swallowing goals, pt in bed and agreeable to therapeutic tasks. SLP providing set up A for breakfast meal and overall min A cues for use of safe swallow strategies, primarily slow rate and small bites/sips. Pt alternating solids and liquids with occ verbal cues to assist in clearing mild oral stasis. Pt with no overt s/s aspiration with any intake Dys 2 solids and NTL via cup, consuming 100% meal and 16oz NTL. Pt answering simple yes/no questions with initial strong "yes" bias this date, max cues to increase y/n accuracy and "no" responses when appropriate. As communication tasks progressed, pt accuracy increased. Pt attempting to count 1-10 and name days of the week, benefiting from max cues for 50% accuracy. Pt singing "Happy Birthday" with appropriate intonation and ~75% accuracy/intelligibility. SLP introducing yes/no communication board and healthcare communication board, pt receptive to new learning  but requires more training/education to utilize with accuracy. Pt left in bed with alarm set and all needs met, cont ST POC.   Pain Pain Assessment Pain Scale: Faces Faces Pain Scale: No hurt  Therapy/Group: Individual Therapy  Tacey Ruiz 10/30/2022, 7:55 AM

## 2022-10-30 NOTE — Progress Notes (Signed)
PROGRESS NOTE   Subjective/Complaints:  Pt doing ok, nods his head yes stating that he slept ok. Denies pain. LBM yesterday per report (pt initially stated day before yesterday, but reports show 2 BMs yesterday). Urinating ok per pt (incontinent, but low PVRs and no caths overnight). Denies other complaints but difficult to assess due to aphasia. Shakes his head no in response to whether he has any CP, SOB, abd pain, n/v/d.   ROS: Difficult to ascertain due to aphasia; see HPI above  Objective:   CT HEAD WO CONTRAST ( )  Result Date: 10/28/2022 CLINICAL DATA:  Follow-up examination for stroke. Mental status change. EXAM: CT HEAD WITHOUT CONTRAST TECHNIQUE: Contiguous axial images were obtained from the base of the skull through the vertex without intravenous contrast. RADIATION DOSE REDUCTION: This exam was performed according to the departmental dose-optimization program which includes automated exposure control, adjustment of the mA and/or kV according to patient size and/or use of iterative reconstruction technique. COMPARISON:  Prior CT from 10/25/2022 as well as earlier studies. FINDINGS: Brain: Evolving left MCA territory infarct again seen, most pronounced at the left basal ganglia, relatively similar to previous MRI. Persistent hyperdensity at the left sylvian fissure, similar to prior. Given persistence, finding is consistent with small volume subarachnoid hemorrhage. Overall, this is slightly decreased from prior. Additional small volume subarachnoid noted superiorly at the high left frontal region (series 3, image 24). No other acute intracranial hemorrhage. No other acute large vessel territory infarct. No mass lesion or midline shift. No hydrocephalus or extra-axial fluid collection. Vascular: Persistent hyperdensity within the left MCA, suggesting thrombus and/or diminished flow, similar to prior. Scattered calcified  atherosclerosis about the skull base. Skull: Scalp soft tissues demonstrate no acute finding. Calvarium intact. Sinuses/Orbits: Prior scleral buckle on the right. Paranasal sinuses are largely clear. Trace left mastoid effusion, of doubtful significance. Other: None. IMPRESSION: 1. Evolving left MCA territory infarct, relatively stable in size and distribution from prior. 2. Persistent hyperdensity at the left sylvian fissure and high left frontal lobe, consistent with small volume subarachnoid hemorrhage. 3. Persistent hyperdensity within the left MCA, suggesting thrombus and/or diminished flow, similar to prior. 4. No other new acute intracranial abnormality. Electronically Signed   By: Rise Mu M.D.   On: 10/28/2022 21:18   Recent Labs    10/28/22 0942  WBC 5.1  HGB 14.2  HCT 42.0  PLT 156   Recent Labs    10/28/22 0942  NA 133*  K 4.3  CL 103  CO2 21*  GLUCOSE 97  BUN 22  CREATININE 1.27*  CALCIUM 9.1    Intake/Output Summary (Last 24 hours) at 10/30/2022 1116 Last data filed at 10/30/2022 0700 Gross per 24 hour  Intake 1190 ml  Output --  Net 1190 ml        Physical Exam: Vital Signs Blood pressure 122/84, pulse 81, temperature 97.7 F (36.5 C), temperature source Oral, resp. rate 16, height 6\' 3"  (1.905 m), weight 97.6 kg, SpO2 100%.  General: No apparent distress.  Laying in bed. HEENT: Head is normocephalic, atraumatic, PERRLA, EOMI neck: Supple without JVD or lymphadenopathy Heart: reg rate, irregularly irregular rhythm. No murmurs rubs or gallops  Chest: CTA bilaterally without wheezes, rales, or rhonchi; no distress Abdomen: Soft, non-tender, non-distended, bowel sounds positive. Extremities: No clubbing, cyanosis, or edema.   Psych: Flat affect Skin: Clean and intact without signs of breakdown  PRIOR EXAMS: Neuro: Orientation assessment limited by aphasia, is oriented to self and place as hospital.  Can get month with options, not year. +  expressive greater than receptive aphasia,  + Right hemineglect CN: + right facial droop, decreased shoulder shrug on the right  Strength 5 out of 5 in left upper extremity left lower extremity Strength 0 out of 5 throughout right upper extremity and right lower extremity Sensation to light touch decreased in right upper extremity and right lower extremity  Musculoskeletal:  MAS 1 R elbow flexors, shoulder adductors MAS 3 R hamstrings and right plantar flexors -unchanged       Assessment/Plan: 1. Functional deficits which require 3+ hours per day of interdisciplinary therapy in a comprehensive inpatient rehab setting. Physiatrist is providing close team supervision and 24 hour management of active medical problems listed below. Physiatrist and rehab team continue to assess barriers to discharge/monitor patient progress toward functional and medical goals  Care Tool:  Bathing    Body parts bathed by patient: Chest, Abdomen, Right upper leg, Left upper leg, Face   Body parts bathed by helper: Right arm, Front perineal area, Buttocks, Right lower leg, Left lower leg     Bathing assist Assist Level: 2 Helpers     Upper Body Dressing/Undressing Upper body dressing   What is the patient wearing?: Pull over shirt    Upper body assist Assist Level: Maximal Assistance - Patient 25 - 49%    Lower Body Dressing/Undressing Lower body dressing      What is the patient wearing?: Pants, Incontinence brief     Lower body assist Assist for lower body dressing: 2 Helpers     Toileting Toileting    Toileting assist Assist for toileting: 2 Helpers     Transfers Chair/bed transfer  Transfers assist  Chair/bed transfer activity did not occur: Safety/medical concerns  Chair/bed transfer assist level: Dependent - mechanical lift     Locomotion Ambulation   Ambulation assist   Ambulation activity did not occur: Safety/medical concerns          Walk 10 feet  activity   Assist  Walk 10 feet activity did not occur: Safety/medical concerns        Walk 50 feet activity   Assist Walk 50 feet with 2 turns activity did not occur: Safety/medical concerns         Walk 150 feet activity   Assist Walk 150 feet activity did not occur: Safety/medical concerns         Walk 10 feet on uneven surface  activity   Assist Walk 10 feet on uneven surfaces activity did not occur: Safety/medical concerns         Wheelchair     Assist Is the patient using a wheelchair?: Yes Type of Wheelchair: Manual    Wheelchair assist level: Dependent - Patient 0% (pt unable to assist due to strength and tonal deficits)      Wheelchair 50 feet with 2 turns activity    Assist        Assist Level: Dependent - Patient 0%   Wheelchair 150 feet activity     Assist      Assist Level: Dependent - Patient 0%   Blood pressure 122/84, pulse 81, temperature 97.7 F (36.5 C),  temperature source Oral, resp. rate 16, height 6\' 3"  (1.905 m), weight 97.6 kg, SpO2 100%.  Medical Problem List and Plan: 1. 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 Pt has hx of prior CVA with residual aphasia.              -patient may shower             -ELOS/Goals: 14-18, PT/OT/SLP min A to sup             -Order PRAFO/WHO -utilize nightly             -Continue CIR  - 8/15 cognitive status mildly reduced from yesterday, is still within normal limits from admission given documentation but given CT head 8-12 with questionable IPH, recommended serial repeat, will repeat CT head without contrast today  -8-16: CT head without significant changes, doing better cognitively today with encouragement, monitor   2.  Antithrombotics: -DVT/anticoagulation:  Pharmaceutical: Xarelto 20mg  daily w/breakfast?             -antiplatelet therapy: N/A  3. Pain Management/right sided spasticity:   Tylenol. Local measures.  --Per chart review was on tramadol? Naltrexone? Lidocaine patches?    -8-15: Increase spasticity right lower extremity, add baclofen 10 mg 3 times daily -8-16: Pain in right lower extremity, spasticity versus neuropathic, start gabapentin 100 mg 3 times daily.  Check renal function on Monday  4. Mood/Behavior/Sleep: LCSW to follow for evaluation and support.              -antipsychotic agents: N/A  5. Neuropsych/cognition: This patient is not capable of making decisions on his own behalf.  6. Skin/Wound Care: Routine pressure relief measures  7. Fluids/Electrolytes/Nutrition: Monitor I/O. Check CMET in am             --assist with meals  -8-15: Labs improved, discontinue IV fluid 50 cc/h; check Monday  8. HTN: Home meds Amlodipine 5mg , Hydralazine 25mg  daily,  goal SBP < 180, long term goal normotensive -Monitor BP TID continue amlodipine - stable -8-15: Has been mostly normotensive, will reduce amlodipine back to 5 mg as perfusion difficulty may be contributing to cognitive difficulties at this time. -8-16: Remains hypotensive today, will DC amlodipine; no current medications -10/30/22 BPs improving, monitor  Vitals:   10/26/22 1515 10/26/22 2108 10/27/22 0611 10/27/22 1422  BP: (!) 133/94 (!) 141/92 (!) 154/97 (!) 125/97   10/27/22 1952 10/28/22 0405 10/28/22 1259 10/28/22 2043  BP: (!) 122/91 (!) 152/91 130/85 110/76   10/29/22 0555 10/29/22 1505 10/29/22 1939 10/30/22 0530  BP: 126/87 107/77 117/79 122/84     9. A fib: Monitor HR TID. On xarelto. Heart rate controlled  -10/30/22 rate controlled  10.  Thrombocytopenia: Recheck platelets in am. Monitor for signs of bleeding.-- plt 156 on 10/28/22  11. Dysphagia: On D2,thins. Encourage fluid intake   - Diet eval this AM -doing very well with p.o. intakes  12. Acute on chronic renal failure:  SCr 1.32 at admission.  --Continue to monitor and encourage intake  - uptrending on last labs 8/12, encourage  fluids, BMP in AM -stable, reassess q. Monday  13. Hyponatremia: Na dropped to 132 in 48 hours. Recheck in am   - 8-15: Stable 1 32-1 35, monitor   14.  H/o CAD s/p PCI: Monitor for symptoms with increase in activity.             --continue Crestor 40mg   daily  15.  Situational depression: Lexapro 10mg  added on acute.    - Also benefit for motor recovery of UE; would not adjust  16. Hx of multiple cancers: R-RCC s/p nephrectomy 2015.  -Right adrenal pheochromocytoma  s/p right adrenalectomy 2017.  -Prostate CA s/p IMRT 2018.  Continue flomax -RUL nodule being monitored.  17. H/o Emphysema?: Noted on CT chest on care everywhere.    - stable  18. HLD. Continue statin as above  19. Substance abuse. Encourage cessation  20.  Urinary retention.  On Flomax 0.4 mg at baseline, increased to 0.8 mg today -10/30/22 Remains incontinent but low PVRs, encourage up to bedside and monitor-- could claritin be interfering?  21. Constipation: -10/30/22 reported BM 2 days ago but looks like he had two yesterday but none for 2 days prior. Will schedule senokot 1 tab daily and add miralax daily PRN for now-- monitor, if oversoftened/too frequent with senokot then may just switch back to PRN but pt will have difficult time asking for PRNs  LOS: 4 days A FACE TO FACE EVALUATION WAS PERFORMED  [address] 10/30/2022, 11:16 AM

## 2022-10-30 NOTE — Progress Notes (Signed)
Occupational Therapy Session Note  Patient Details  Name: [name] MRN: [id number] Date of Birth: 1950/11/12  Today's Date: 10/30/2022 OT Missed Time: 45 Minutes Missed Time Reason: Pain;Patient fatigue  Skilled Therapeutic Interventions/Progress Updates:    Tried to engage patient in activity - but pt actively resistive towards touch and assistance. Pt closing eyes and pushing therapist's hands away. Tried different ways to engage pt but no success. Talked with nurse and suspected pt was in discomfort- discussed with RN. Pt left resting in the bed. Missed 45 min   Therapy Documentation Precautions:  Precautions Precautions: Fall Precaution Comments: R hemiplegia, aphasia (inconsistent yes/no) Restrictions Weight Bearing Restrictions: No General: General OT Amount of Missed Time: 45 Minutes Vital Signs:   Pain: Pain Assessment Pain Scale: Faces Pain Location: Hip Pain Orientation: Right Pain Intervention(s): Medication (See eMAR) (therapy prophylatic)   Therapy/Group: individual therapy   Roney Mans [facility] 10/30/2022, 6:08 PM

## 2022-10-30 NOTE — Progress Notes (Signed)
Occupational Therapy Session Note  Patient Details  Name: Frank Barron MRN: [id number] Date of Birth: 11/09/1950  Today's Date: 10/30/2022 OT Individual Time: 1122-1210 OT Individual Time Calculation (min): 48 min    Short Term Goals: Week 1:  OT Short Term Goal 1 (Week 1): Pt will don shirt with minA OT Short Term Goal 2 (Week 1): Pt will complete sit <> stand with LUE support, mod A OT Short Term Goal 3 (Week 1): Pt will complete LB dressing with max A +1  Skilled Therapeutic Interventions/Progress Updates:  Pt greeted supine in bed, nodding in agreement to therapy session. Pt completed supine>sit to L side of bed with MOD A +2. Pt with fair sitting balance able to sit EOB with MIN- MODA. Attempted multiple stands with stedy however pt pushing against assist with LUE, unable to stand pt despite multiple attempts.   Returned pt to supine with total A +2, as pt does state "yes" when asked if pt was "done."   Pt was agreeable to work on estim to RUE from bed level.   Saebo Stim One applied to pt's R elbow flexors for muscle activation. It was increased to level 8. 10 min attended e-stim with a focus on elbow flexion/extension for higher level ADL tasks. no c/o pain before/after tx and no adverse skin reactions.  330 pulse width 35 Hz pulse rate On 8 sec/ off 8 sec Ramp up/ down 2 sec Symmetrical Biphasic wave form  Max intensity at 500 Ohm load  Pts daughter was present during session asking about pts anticipated level of function at DC. Recommended to daughter to wait until conference on Tuesday to determine next steps, anticipated goals and level of function at DC. Daughter reports that pts wife is on a RW, did educate daughter that sometimes pts do DC from CIR and go to SNF for more rehab.   Ended session with pt supine in bed with RUE propped on pillow and all needs within reach, bed alarm activated.   Therapy Documentation Precautions:  Precautions Precautions:  Fall Precaution Comments: R hemiplegia, aphasia (inconsistent yes/no) Restrictions Weight Bearing Restrictions: No    Pain: no pain indicated during session     Therapy/Group: Individual Therapy  Pollyann Glen [facility] 10/30/2022, 12:20 PM

## 2022-10-31 DIAGNOSIS — I63512 Cerebral infarction due to unspecified occlusion or stenosis of left middle cerebral artery: Secondary | ICD-10-CM | POA: Diagnosis not present

## 2022-10-31 DIAGNOSIS — I10 Essential (primary) hypertension: Secondary | ICD-10-CM | POA: Diagnosis not present

## 2022-10-31 DIAGNOSIS — K5901 Slow transit constipation: Secondary | ICD-10-CM | POA: Diagnosis not present

## 2022-10-31 MED ORDER — ORAL CARE MOUTH RINSE: 15.0000 mL | OROMUCOSAL | Status: DC | PRN

## 2022-10-31 MED ORDER — ORAL CARE MOUTH RINSE
15.0000 mL | OROMUCOSAL | Status: DC
  Administered 2022-10-31 – 2022-11-19 (×65): 15 mL via OROMUCOSAL

## 2022-10-31 NOTE — Progress Notes (Signed)
Patient with no void after 8 hours this evening again and unable to void. Patient cathed for . Urine tea colored/bloody. Coude cath and lidocaine used-no resistance met. No signs of discomfort. Patient tolerated well.   -4401 Patient with no void, scanned for 288 and cathed for of tea colored urine. Coude cath and lidocaine used. Patient tolerated well.

## 2022-10-31 NOTE — Progress Notes (Addendum)
At 2045 patient voided incontinent in brief. Bladder scan for 323 and cathed for of clear yellow urine. Patient tolerated well. Patient also noted with incontinent bowel movement at that time. Patient cleaned up and call bell left within reach.   0455- No void 8 hours and patient unable to go. Bladder scan 201 and cathed for 300cc. No resistance met but some blood noted towards end of cath otherwise urine clear. Patient tolerated well, denies pain. Call bell left within reach.

## 2022-10-31 NOTE — Progress Notes (Signed)
PROGRESS NOTE   Subjective/Complaints:  Pt doing ok, nods his head yes stating that he slept ok. Very inconsistent with responses today. Unclear if he's having pain, mostly perseverates on the answer of yes, but sometimes shakes his head no. Doesn't seem to have any signs of pain during exam.  LBM yesterday. Has urinary incontinence, needed caths overnight. Denies other complaints but difficult to assess due to aphasia. Shakes his head no when asked if anything else is going on but then says yes or alright to other questions.   ROS: Difficult to ascertain due to aphasia; see HPI above  Objective:   No results found. No results for input(s): "WBC", "HGB", "HCT", "PLT" in the last 72 hours.  No results for input(s): "NA", "K", "CL", "CO2", "GLUCOSE", "BUN", "CREATININE", "CALCIUM" in the last 72 hours.   Intake/Output Summary (Last 24 hours) at 10/31/2022 1126 Last data filed at 10/31/2022 0700 Gross per 24 hour  Intake 338 ml  Output 750 ml  Net -412 ml        Physical Exam: Vital Signs Blood pressure 105/79, pulse 63, temperature 98.2 F (36.8 C), temperature source Oral, resp. rate 18, height 6\' 3"  (1.905 m), weight 97.6 kg, SpO2 100%.  General: No apparent distress.  Laying in bed. HEENT: Head is normocephalic, atraumatic, PERRLA, EOMI neck: Supple without JVD or lymphadenopathy Heart: reg rate, irregularly irregular rhythm. No murmurs rubs or gallops Chest: CTA bilaterally without wheezes, rales, or rhonchi; no distress Abdomen: Soft, non-tender, non-distended, bowel sounds positive. Extremities: No clubbing, cyanosis, or edema.   Psych: Flat affect, mostly says yes or alright to questions, inconsistently nods or shakes his head. Skin: Clean and intact without signs of breakdown over exposed surfaces MsK: no tenderness to R foot during exam today  PRIOR EXAMS: Neuro: Orientation assessment limited by aphasia, is  oriented to self and place as hospital.  Can get month with options, not year. + expressive greater than receptive aphasia,  + Right hemineglect CN: + right facial droop, decreased shoulder shrug on the right  Strength 5 out of 5 in left upper extremity left lower extremity Strength 0 out of 5 throughout right upper extremity and right lower extremity Sensation to light touch decreased in right upper extremity and right lower extremity  Musculoskeletal:  MAS 1 R elbow flexors, shoulder adductors MAS 3 R hamstrings and right plantar flexors -unchanged       Assessment/Plan: 1. Functional deficits which require 3+ hours per day of interdisciplinary therapy in a comprehensive inpatient rehab setting. Physiatrist is providing close team supervision and 24 hour management of active medical problems listed below. Physiatrist and rehab team continue to assess barriers to discharge/monitor patient progress toward functional and medical goals  Care Tool:  Bathing    Body parts bathed by patient: Chest, Abdomen, Right upper leg, Left upper leg, Face   Body parts bathed by helper: Right arm, Front perineal area, Buttocks, Right lower leg, Left lower leg     Bathing assist Assist Level: 2 Helpers     Upper Body Dressing/Undressing Upper body dressing   What is the patient wearing?: Pull over shirt    Upper body assist Assist  Level: Maximal Assistance - Patient 25 - 49%    Lower Body Dressing/Undressing Lower body dressing      What is the patient wearing?: Pants, Incontinence brief     Lower body assist Assist for lower body dressing: 2 Helpers     Toileting Toileting    Toileting assist Assist for toileting: 2 Helpers     Transfers Chair/bed transfer  Transfers assist  Chair/bed transfer activity did not occur: Safety/medical concerns  Chair/bed transfer assist level: Dependent - mechanical lift     Locomotion Ambulation   Ambulation assist   Ambulation  activity did not occur: Safety/medical concerns          Walk 10 feet activity   Assist  Walk 10 feet activity did not occur: Safety/medical concerns        Walk 50 feet activity   Assist Walk 50 feet with 2 turns activity did not occur: Safety/medical concerns         Walk 150 feet activity   Assist Walk 150 feet activity did not occur: Safety/medical concerns         Walk 10 feet on uneven surface  activity   Assist Walk 10 feet on uneven surfaces activity did not occur: Safety/medical concerns         Wheelchair     Assist Is the patient using a wheelchair?: Yes Type of Wheelchair: Manual    Wheelchair assist level: Dependent - Patient 0% (pt unable to assist due to strength and tonal deficits)      Wheelchair 50 feet with 2 turns activity    Assist        Assist Level: Dependent - Patient 0%   Wheelchair 150 feet activity     Assist      Assist Level: Dependent - Patient 0%   Blood pressure 105/79, pulse 63, temperature 98.2 F (36.8 C), temperature source Oral, resp. rate 18, height 6\' 3"  (1.905 m), weight 97.6 kg, SpO2 100%.  Medical Problem List and Plan: 1. 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 Pt has hx of prior CVA with residual aphasia.              -patient may shower             -ELOS/Goals: 14-18, PT/OT/SLP min A to sup             -Order PRAFO/WHO -utilize nightly             -Continue CIR  - 8/15 cognitive status mildly reduced from yesterday, is still within normal limits from admission given documentation but given CT head 8-12 with questionable IPH, recommended serial repeat, will repeat CT head without contrast today  -8-16: CT head without significant changes, doing better cognitively today with encouragement, monitor   2.  Antithrombotics: -DVT/anticoagulation:  Pharmaceutical: Xarelto 20mg  daily w/breakfast?              -antiplatelet therapy: N/A  3. Pain Management/right sided spasticity:  Tylenol. Local measures.  --Per chart review was on tramadol? Naltrexone? Lidocaine patches?    -8-15: Increase spasticity right lower extremity, add baclofen 10 mg 3 times daily -8-16: Pain in right lower extremity, spasticity versus neuropathic, start gabapentin 100 mg 3 times daily.  Check renal function on Monday  4. Mood/Behavior/Sleep: LCSW to follow for evaluation and support.              -antipsychotic agents: N/A  5. Neuropsych/cognition: This patient is not capable of making decisions on his own behalf.  6. Skin/Wound Care: Routine pressure relief measures  7. Fluids/Electrolytes/Nutrition: Monitor I/O. Check CMET in am             --assist with meals  -8-15: Labs improved, discontinue IV fluid 50 cc/h; check Monday  8. HTN: Home meds Amlodipine 5mg , Hydralazine 25mg  daily,  goal SBP < 180, long term goal normotensive -Monitor BP TID continue amlodipine - stable -8-15: Has been mostly normotensive, will reduce amlodipine back to 5 mg as perfusion difficulty may be contributing to cognitive difficulties at this time. -8-16: Remains hypotensive today, will DC amlodipine; no current medications -8/17-18/24 BPs improving, monitor  Vitals:   10/27/22 0611 10/27/22 1422 10/27/22 1952 10/28/22 0405  BP: (!) 154/97 (!) 125/97 (!) 122/91 (!) 152/91   10/28/22 1259 10/28/22 2043 10/29/22 0555 10/29/22 1505  BP: 130/85 110/76 126/87 107/77   10/29/22 1939 10/30/22 0530 10/30/22 2031 10/31/22 0409  BP: 117/79 122/84 108/72 105/79     9. A fib: Monitor HR TID. On xarelto. Heart rate controlled  -10/30/22 rate controlled  10.  Thrombocytopenia: Recheck platelets in am. Monitor for signs of bleeding.-- plt 156 on 10/28/22  11. Dysphagia: On D2,thins. Encourage fluid intake   - Diet eval this AM -doing very well with p.o. intakes  12. Acute on chronic renal failure:  SCr 1.32 at admission.  --Continue to  monitor and encourage intake  - uptrending on last labs 8/12, encourage fluids, BMP in AM -stable, reassess q. Monday  13. Hyponatremia: Na dropped to 132 in 48 hours. Recheck in am   - 8-15: Stable 132-135, monitor   14.  H/o CAD s/p PCI: Monitor for symptoms with increase in activity.             --continue Crestor 40mg  daily  15.  Situational depression: Lexapro 10mg  added on acute.    - Also benefit for motor recovery of UE; would not adjust  16. Hx of multiple cancers: R-RCC s/p nephrectomy 2015.  -Right adrenal pheochromocytoma  s/p right adrenalectomy 2017.  -Prostate CA s/p IMRT 2018.  Continue flomax -RUL nodule being monitored.  17. H/o Emphysema?: Noted on CT chest on care everywhere.    - stable  18. HLD. Continue statin as above  19. Substance abuse. Encourage cessation  20.  Urinary retention.  On Flomax 0.4 mg at baseline, increased to 0.8 mg today -10/30/22 Remains incontinent but low PVRs, encourage up to bedside and monitor-- could claritin be interfering? -10/31/22 needed caths overnight; defer adjustments to weekday team  21. Constipation: -10/30/22 reported BM 2 days ago but looks like he had two yesterday but none for 2 days prior. Will schedule senokot 1 tab daily and add miralax daily PRN for now-- monitor, if oversoftened/too frequent with senokot then may just switch back to PRN but pt will have difficult time asking for PRNs -10/31/22 LBM yesterday, monitor with current regimen  LOS: 5 days A FACE TO FACE EVALUATION WAS PERFORMED  [address] 10/31/2022, 11:26 AM

## 2022-10-31 NOTE — Progress Notes (Signed)
Speech Language Pathology Daily Session Note  Patient Details  Name: Frank Barron MRN: [id number] Date of Birth: 09-09-1950  Today's Date: 10/31/2022 SLP Individual Time: 0800-0900 SLP Individual Time Calculation (min): 60 min  Short Term Goals: Week 1: SLP Short Term Goal 1 (Week 1): Patient will utilize compensatory strategies to consume dys2 / thin liquids with no overt s/sx of aspiration with maxA SLP Short Term Goal 2 (Week 1): Patient will utilize multimodal means of communication to express wants/needs with maxA SLP Short Term Goal 3 (Week 1): Patient will participate in automatic speech tasks with 25% accuracy and max multimodal A SLP Short Term Goal 4 (Week 1): Patient will increase word finding of common objects using compensatory strategies with max multimodal A SLP Short Term Goal 5 (Week 1): Patinet will demonstrate sustained attention with max multimodal A for 5 minutes during functional tasks  Skilled Therapeutic Interventions:  Pt was seen in am to address dysphagia management and expressive and receptive language. Pt was alert upon SLP arrival. SLP retrieved breakfast tray consisting of Dys 2 solids and NTL. Dentures were located in a cup on bedside table. Pt declined donning dentures for meal on multiple attempts. Education completed to pt on benefits during meal for placing with pt continuing to decline the, SLP provided meal set up and pt administered all trials. Pt with adequate bolus formation, transit and deglutition with no observed pocketing during meal. He intermittently consumed nectar thick liquid via cup edge. Occasional anterior spillage without awareness to R side observed. SLP provided verbal cues for tight labial seal with decreased spillage. Pt consumed medication crushed in puree without difficulty. At conclusion of meal, SLP assisted pt in oral hygiene with pt initially completing a round and SLP finishing task for thoroughness. Pt reached over and placed  dentures after oral hygiene. In additional minutes of session, SLP addressed auditory comprehension of yes/ no questions. Given presentation of biographical information, pt answered questions accurately with 72% acc indep. Pt initially shaking his head in response to clinician question however after model was provided pt with greater consistency verbalizing yes or no. Pt challenged in automatic speech through repetition of song and familiar sayings. Pt verbalized "happy birthday" "Monday, Wednesday, and Friday" "one and eight." Pt perseverating on "one" this date when challenged to produce language form open ended question. Pt observed with groping and able to approximate [place] by saying "Inez Pilgrim." He also approximated his last night. Spontaneous speech observed throughout session included, "oh okay" and "oh man." Pt was left at bedside with call button within reach and bed alarm active. SLP to continue POC.   Pain Pain Assessment Pain Scale: Faces Faces Pain Scale: No hurt  Therapy/Group: Individual Therapy  Renaee Munda 10/31/2022, 8:58 AM

## 2022-11-01 DIAGNOSIS — I63512 Cerebral infarction due to unspecified occlusion or stenosis of left middle cerebral artery: Secondary | ICD-10-CM | POA: Diagnosis not present

## 2022-11-01 LAB — BASIC METABOLIC PANEL
Anion gap: 8 (ref 5–15)
BUN: 31 mg/dL — ABNORMAL HIGH (ref 8–23)
CO2: 22 mmol/L (ref 22–32)
Calcium: 8.9 mg/dL (ref 8.9–10.3)
Chloride: 106 mmol/L (ref 98–111)
Creatinine, Ser: 1.62 mg/dL — ABNORMAL HIGH (ref 0.61–1.24)
GFR, Estimated: 45 mL/min — ABNORMAL LOW (ref >60)
Glucose, Bld: 88 mg/dL (ref 70–99)
Potassium: 4.9 mmol/L (ref 3.5–5.1)
Sodium: 136 mmol/L (ref 135–145)

## 2022-11-01 LAB — CBC
HCT: 39.8 % (ref 39.0–52.0)
Hemoglobin: 13.1 g/dL (ref 13.0–17.0)
MCH: 30 pg (ref 26.0–34.0)
MCHC: 32.9 g/dL (ref 30.0–36.0)
MCV: 91.3 fL (ref 80.0–100.0)
Platelets: 220 10*3/uL (ref 150–400)
RBC: 4.36 MIL/uL (ref 4.22–5.81)
RDW: 14.3 % (ref 11.5–15.5)
WBC: 4.1 10*3/uL (ref 4.0–10.5)
nRBC: 0 % (ref 0.0–0.2)

## 2022-11-01 MED ORDER — TRAMADOL HCL 50 MG PO TABS
50.0000 mg | ORAL_TABLET | Freq: Four times a day (QID) | ORAL | Status: DC
  Administered 2022-11-02 (×2): 50 mg via ORAL
  Filled 2022-11-01 (×2): qty 1

## 2022-11-01 MED ORDER — POLYETHYLENE GLYCOL 3350 17 G PO PACK
17.0000 g | PACK | Freq: Two times a day (BID) | ORAL | Status: DC
  Administered 2022-11-01 – 2022-11-19 (×24): 17 g via ORAL
  Filled 2022-11-01 (×35): qty 1

## 2022-11-01 MED ORDER — BETHANECHOL CHLORIDE 10 MG PO TABS
10.0000 mg | ORAL_TABLET | Freq: Three times a day (TID) | ORAL | Status: DC
  Administered 2022-11-01 – 2022-11-02 (×4): 10 mg via ORAL
  Filled 2022-11-01 (×4): qty 1

## 2022-11-01 MED ORDER — SODIUM CHLORIDE 0.9 % IV SOLN: INTRAVENOUS | Status: DC | PRN

## 2022-11-01 MED ORDER — SENNOSIDES-DOCUSATE SODIUM 8.6-50 MG PO TABS
2.0000 | ORAL_TABLET | Freq: Two times a day (BID) | ORAL | Status: DC
  Administered 2022-11-02 – 2022-11-19 (×29): 2 via ORAL
  Filled 2022-11-01 (×36): qty 2

## 2022-11-01 NOTE — Progress Notes (Signed)
PROGRESS NOTE   Subjective/Complaints:  Cr and BUN increased this AM. Multiple ISC overnight. Last adequate BM 8/16 IVF 75 cc/hr today, add bethenachol, get urine studies, and increase miralax to scheduled BID At bedside, patient complaining of pain in thighs, poiunting bilaterally. Difficulty following simple commands for motor testing.  PT noting some orthostatic hypotension last few sessions  ROS: Difficult to ascertain due to aphasia; see HPI above  Objective:   No results found. Recent Labs    11/01/22 0557  WBC 4.1  HGB 13.1  HCT 39.8  PLT 220    Recent Labs    11/01/22 0557  NA 136  K 4.9  CL 106  CO2 22  GLUCOSE 88  BUN 31*  CREATININE 1.62*  CALCIUM 8.9     Intake/Output Summary (Last 24 hours) at 11/01/2022 0837 Last data filed at 11/01/2022 0527 Gross per 24 hour  Intake 440 ml  Output 800 ml  Net -360 ml        Physical Exam: Vital Signs Blood pressure 114/77, pulse 69, temperature 97.6 F (36.4 C), temperature source Oral, resp. rate 18, height 6\' 3"  (1.905 m), weight 97.6 kg, SpO2 97%.  General: No apparent distress. Sitting up at bedside.  HEENT: Head is normocephalic, atraumatic, PERRLA, EOMI neck: Supple without JVD or lymphadenopathy Heart: reg rate, irregularly irregular rhythm. No murmurs rubs or gallops Chest: CTA bilaterally without wheezes, rales, or rhonchi; no distress Abdomen: Soft, non-tender, non-distended, bowel sounds positive. Extremities: No clubbing, cyanosis, or edema.   Psych: Flat affect, difficult d/t aphasia Skin: Clean and intact without signs of breakdown over exposed surfaces MsK: no apparent deformities or tenderness Neuro: Orientation assessment limited by aphasia, is oriented to self and place as hospital; year at 2024.    + expressive greater than receptive aphasia,  + Right hemineglect CN: + right facial droop, decreased shoulder shrug on the  right  Strength 5 out of 5 in left upper extremity left lower extremity, with exception of left hip flexion 3-/5 due to pain Strength 0 out of 5 throughout right upper extremity and right lower extremity Sensation to light touch decreased in right upper extremity and right lower extremity  Tone:  MAS 1 R elbow flexors, shoulder adductors MAS 2 R hamstrings and right plantar flexors - reduced       Assessment/Plan: 1. Functional deficits which require 3+ hours per day of interdisciplinary therapy in a comprehensive inpatient rehab setting. Physiatrist is providing close team supervision and 24 hour management of active medical problems listed below. Physiatrist and rehab team continue to assess barriers to discharge/monitor patient progress toward functional and medical goals  Care Tool:  Bathing    Body parts bathed by patient: Chest, Abdomen, Right upper leg, Left upper leg, Face   Body parts bathed by helper: Right arm, Front perineal area, Buttocks, Right lower leg, Left lower leg     Bathing assist Assist Level: 2 Helpers     Upper Body Dressing/Undressing Upper body dressing   What is the patient wearing?: Pull over shirt    Upper body assist Assist Level: Maximal Assistance - Patient 25 - 49%    Lower Body Dressing/Undressing  Lower body dressing      What is the patient wearing?: Pants, Incontinence brief     Lower body assist Assist for lower body dressing: 2 Helpers     Toileting Toileting    Toileting assist Assist for toileting: 2 Helpers     Transfers Chair/bed transfer  Transfers assist  Chair/bed transfer activity did not occur: Safety/medical concerns  Chair/bed transfer assist level: Dependent - mechanical lift     Locomotion Ambulation   Ambulation assist   Ambulation activity did not occur: Safety/medical concerns          Walk 10 feet activity   Assist  Walk 10 feet activity did not occur: Safety/medical concerns         Walk 50 feet activity   Assist Walk 50 feet with 2 turns activity did not occur: Safety/medical concerns         Walk 150 feet activity   Assist Walk 150 feet activity did not occur: Safety/medical concerns         Walk 10 feet on uneven surface  activity   Assist Walk 10 feet on uneven surfaces activity did not occur: Safety/medical concerns         Wheelchair     Assist Is the patient using a wheelchair?: Yes Type of Wheelchair: Manual    Wheelchair assist level: Dependent - Patient 0% (pt unable to assist due to strength and tonal deficits)      Wheelchair 50 feet with 2 turns activity    Assist        Assist Level: Dependent - Patient 0%   Wheelchair 150 feet activity     Assist      Assist Level: Dependent - Patient 0%   Blood pressure 114/77, pulse 69, temperature 97.6 F (36.4 C), temperature source Oral, resp. rate 18, height 6\' 3"  (1.905 m), weight 97.6 kg, SpO2 97%.  Medical Problem List and Plan: 1. 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 Pt has hx of prior CVA with residual aphasia.              -patient may shower             -ELOS/Goals: 14-18, PT/OT/SLP min A to sup             -Order PRAFO/WHO -utilize nightly             -Continue CIR  - 8/15 cognitive status mildly reduced from yesterday, is still within normal limits from admission given documentation but given CT head 8-12 with questionable IPH, recommended serial repeat, will repeat CT head without contrast today  -8-16: CT head without significant changes, doing better cognitively today with encouragement, monitor  - 8/19: Cognition limited by pain tin thighs today, no focal deficits; monitor closely   2.  Antithrombotics: -DVT/anticoagulation:  Pharmaceutical: Xarelto 20mg  daily w/breakfast?             -antiplatelet therapy: N/A  3. Pain Management/right sided spasticity:   Tylenol. Local measures.  --Per chart review was on tramadol? Naltrexone? Lidocaine patches?    -8-15: Increase spasticity right lower extremity, add baclofen 10 mg 3 times daily -8-16: Pain in right lower extremity, spasticity versus neuropathic, start gabapentin 100 mg 3 times daily.  Check renal function on Monday 8/19: BL LE pain, aching today; increase tramadol to 50 mg BID; cannot increase baclofen/gabapentin d/t aki  4. Mood/Behavior/Sleep: LCSW to follow for evaluation  and support.              -antipsychotic agents: N/A  5. Neuropsych/cognition: This patient is not capable of making decisions on his own behalf.  6. Skin/Wound Care: Routine pressure relief measures  7. Fluids/Electrolytes/Nutrition/Dysphagia: Monitor I/O. Check CMET in am             --assist with meals  -8-15: Labs improved, discontinue IV fluid 50 cc/h; check Monday  eating 75-100% meals per report   8. HTN: Home meds Amlodipine 5mg , Hydralazine 25mg  daily,  goal SBP < 180, long term goal normotensive -Monitor BP TID continue amlodipine - stable -8-15: Has been mostly normotensive, will reduce amlodipine back to 5 mg as perfusion difficulty may be contributing to cognitive difficulties at this time. -8-16: Remains hypotensive today, will DC amlodipine; no current medications -8/17-18/24 BPs improving, monitor 8/19: Orthostatic; IVF, TEDs, Binder, currently not on PO BP medications  Vitals:   10/28/22 0405 10/28/22 1259 10/28/22 2043 10/29/22 0555  BP: (!) 152/91 130/85 110/76 126/87   10/29/22 1505 10/29/22 1939 10/30/22 0530 10/30/22 2031  BP: 107/77 117/79 122/84 108/72   10/31/22 0409 10/31/22 1634 10/31/22 2010 11/01/22 0509  BP: 105/79 113/78 106/86 114/77     9. A fib: Monitor HR TID. On xarelto. Heart rate controlled  -10/30/22 rate controlled  10.  Thrombocytopenia: Recheck platelets in am. Monitor for signs of bleeding.-- plt 156 on 10/28/22  11. Dysphagia: On D2,thins. Encourage fluid  intake   - see above  12. Acute on chronic renal failure:  SCr 1.32 at admission.  --Continue to monitor and encourage intake  - uptrending on last labs 8/12, encourage fluids, BMP in AM -stable, reassess q. Monday - 8/19: AKI Cr 1.6 again; IVF today 75 cc/hr  13. Hyponatremia: Na dropped to 132 in 48 hours. Recheck in am   - 8-15: Stable 132-135, monitor   8/19: 136; resolved  14.  H/o CAD s/p PCI: Monitor for symptoms with increase in activity.             --continue Crestor 40mg  daily  15.  Situational depression: Lexapro 10mg  added on acute.    - Also benefit for motor recovery of UE; would not adjust  16. Hx of multiple cancers: R-RCC s/p nephrectomy 2015.  -Right adrenal pheochromocytoma  s/p right adrenalectomy 2017.  -Prostate CA s/p IMRT 2018.  Continue flomax -RUL nodule being monitored.  17. H/o Emphysema?: Noted on CT chest on care everywhere.    - stable  18. HLD. Continue statin as above  19. Substance abuse. Encourage cessation  20.  Urinary retention.  On Flomax 0.4 mg at baseline, increased to 0.8 mg today -10/30/22 Remains incontinent but low PVRs, encourage up to bedside and monitor-- could claritin be interfering? -10/31/22 needed caths overnight; defer adjustments to weekday team 8/19: Add bethenacol 10 mg TID for retention  21. Constipation: -10/30/22 reported BM 2 days ago but looks like he had two yesterday but none for 2 days prior. Will schedule senokot 1 tab daily and add miralax daily PRN for now-- monitor, if oversoftened/too frequent with senokot then may just switch back to PRN but pt will have difficult time asking for PRNs -10/31/22 LBM yesterday, however has been smears since 8/17; increase bowel regimen to sennakpot-s 2 tabs BID + miralax BID  LOS: 6 days A FACE TO FACE EVALUATION WAS PERFORMED  Angelina Sheriff 11/01/2022, 8:37 AM

## 2022-11-01 NOTE — Progress Notes (Signed)
Speech Language Pathology Daily Session Note  Patient Details  Name: Frank Barron MRN: [id number] Date of Birth: Feb 18, 1951  Today's Date: 11/01/2022 SLP Individual Time: 1133-1200 SLP Individual Time Calculation (min): 27 min  Short Term Goals: Week 1: SLP Short Term Goal 1 (Week 1): Patient will utilize compensatory strategies to consume dys2 / thin liquids with no overt s/sx of aspiration with maxA SLP Short Term Goal 2 (Week 1): Patient will utilize multimodal means of communication to express wants/needs with maxA SLP Short Term Goal 3 (Week 1): Patient will participate in automatic speech tasks with 25% accuracy and max multimodal A SLP Short Term Goal 4 (Week 1): Patient will increase word finding of common objects using compensatory strategies with max multimodal A SLP Short Term Goal 5 (Week 1): Patinet will demonstrate sustained attention with max multimodal A for 5 minutes during functional tasks  Skilled Therapeutic Interventions: Skilled treatment session focused on communication goals. SLP facilitated session by providing a variety of low tech AAC picture boards. Patient was unable to identify pictures despite Max A multimodal cues but did answer yes/no questions with verbal responses more consistently. Patient able to write his name independently but unable to write any other functional communication with dictation or when attempting to copy. Overall, patient does best with yes/no questions at this time although accuracy is inconsistent. Patient left upright in TIS wheelchair with alarm on and all needs within reach. Continue with current plan of care.      Pain No/Denies Pain   Therapy/Group: Individual Therapy  Ragna Kramlich 11/01/2022, 12:39 PM

## 2022-11-01 NOTE — Progress Notes (Signed)
Occupational Therapy Session Note  Patient Details  Name: Frank Barron MRN: [id number] Date of Birth: 03/14/1951  Today's Date: 11/01/2022 Session 1 OT Individual Time: 0865-7846 OT Individual Time Calculation (min): 42 min   Session 2 OT Individual Time: 1402-1500 OT Individual Time Calculation (min): 58 min   Short Term Goals: Week 1:  OT Short Term Goal 1 (Week 1): Pt will don shirt with minA OT Short Term Goal 2 (Week 1): Pt will complete sit <> stand with LUE support, mod A OT Short Term Goal 3 (Week 1): Pt will complete LB dressing with max A +1  Skilled Therapeutic Interventions/Progress Updates:  Session 1   Pt greeted sitting upright in bed with nurse tech feeding pt breakfast. Handoff to OT. OT encouraged pt to self feed the rest of breakfast. Pt was able to self feed the rest of breakfast using L hand with cues to slow down and make sure he swallowed in between bites. He also needed cues to check for pocketing on L cheek. Bed level LB bathing/dressing completed with max +2 with smear of BM. PT placed on bed pan, but was unable to have any success on bed pan. Bed level UB dressing due to time constraints with max A. OT placed SAEBO e-stim on wrist extensors. SAEBO left on for 60 minutes. OT returned to remove SAEBO with skin intact and no adverse reactions.  Saebo Stim One 330 pulse width 35 Hz pulse rate On 8 sec/ off 8 sec Ramp up/ down 2 sec Symmetrical Biphasic wave form  Max intensity at 500 Ohm load  Pt left semi-reclined in bed with bed alarm on, call bell in reach, and needs met.  Pain:  No pain noted this session.   Session 2 Pt greeted seated in [name] with sister present. Pt brought to therapy gym in wc. BP assessed in sitting 108/82. Sit<>stand in standing frame with max facilitation for upright trunk. Pt only tolerated standing for 3 minutes before he was unsafely leaning forward so we lowered him back to the wc. Pt shook head "no" to standing for a  second time. Addressed initiation and visual scanning using BITs activity.  Speed Dot Test Pt needed hand over hand to initiate pushing dots for the first 45 seconds of activity. Premise of activity finally clicked with pt and he completed two more trials as detailed below.  -1st trial: 4.27 sec reaction time, 28 hits -2nd trial: 2.25 sec reaction time, 53 hits -3rd trial: 2.02 sec reaction time, 59 hits  -R side slower to locate, but improved with repetition.  OT provided gentle stretching and ROM to R UE due to increased tone. Pt then returned to room and Maximove was used to transfer pt back to bed with total A +2. Pt left semi-reclined in bed with bed alarm on, call bell in reach, and needs met.   Therapy Documentation Precautions:  Precautions Precautions: Fall Precaution Comments: R hemiplegia, aphasia (inconsistent yes/no) Restrictions Weight Bearing Restrictions: No Pain: No pain noted this session.   Therapy/Group: Individual Therapy  [name] 11/01/2022, 3:10 PM

## 2022-11-01 NOTE — Progress Notes (Signed)
Physical Therapy Session Note  Patient Details  Name: Frank Barron MRN: [id number] Date of Birth: [birth year]  Today's Date: 11/01/2022 PT Individual Time: 0905-1004 PT Individual Time Calculation (min): 59 min   Short Term Goals: Week 1:  PT Short Term Goal 1 (Week 1): Pt will complete supine to sit EOB min assist PT Short Term Goal 2 (Week 1): Pt will complete sit<>stand mod assist PT Short Term Goal 3 (Week 1): Pt will complete bed<>WC transfer with mod assist and LRAD PT Short Term Goal 4 (Week 1): Pt will initiate WC mobility training  Skilled Therapeutic Interventions/Progress Updates:     Pt presents in room in bed, agreeable to PT indicated by head nod. Pt unable to report pain however does not look to be in physical distress. Session focused on transfer training and tolerance to upright. Vitals monitored throughout session for assessment of orthostatic hypotension, pt WNL with positional changes with TED hose donned however does demonstrate lower BP on RUE vs LUE.  Therapist dons shoes for pt in supine total assist for time management. Pt completes supine to sit with min assist for RLE mgmt and immediate sitting balance. Pt attempt x2 sit<>stand in Stedy max assist x2  however pt pushing too severely to R unable to attain full upright posture to place posterior pads. Pt returns to sitting on EOB, completes sit to supine with max assist x2 for BLE and trunk mgmt, pt able to scoot towards [facility] without cueing or assist prior to sit however difficulty motor planning when prompted to scoot. Pt then completes maxi move transfer total assist x2 bed to [name] and transported dependently to gym. Pt provided with TED hose bilaterally prior to stand to facilitate improved BP response to positional changes.  Pt set up to complete stand in standing frame for tolerance to upright and orient pt to midline in standing position. Pt stands dependently via lift, significant trunk flexion initially with  standing however pt able to facilitate more upright posture with increased time in standing, pt continues to demonstrate signficant pushing with LUE despite manual facilitation to prevent, unable to obtain vitals in standing with LUE due to pushing. Pt able to maintain standing .  Pt returns to room and remains seated in [facility] with all needs within reach, cal light in place and chair alarm donned and activated at end of session.  Vitals: Supine prior to activity: BP 112/81 Seated following standing frame: BP 94/82  Therapy Documentation Precautions:  Precautions Precautions: Fall Precaution Comments: R hemiplegia, aphasia (inconsistent yes/no) Restrictions Weight Bearing Restrictions: No   Therapy/Group: Individual Therapy  Edwin Cap PT, DPT 11/01/2022, 12:34 PM

## 2022-11-02 DIAGNOSIS — I63512 Cerebral infarction due to unspecified occlusion or stenosis of left middle cerebral artery: Secondary | ICD-10-CM | POA: Diagnosis not present

## 2022-11-02 LAB — URINALYSIS, W/ REFLEX TO CULTURE (INFECTION SUSPECTED)
Bacteria, UA: NONE SEEN
Bilirubin Urine: NEGATIVE
Glucose, UA: NEGATIVE mg/dL
Ketones, ur: NEGATIVE mg/dL
Leukocytes,Ua: NEGATIVE
Nitrite: NEGATIVE
Protein, ur: NEGATIVE mg/dL
RBC / HPF: >50 RBC/hpf (ref 0–5)
Specific Gravity, Urine: 1.026 (ref 1.005–1.030)
pH: 5 (ref 5.0–8.0)

## 2022-11-02 LAB — BASIC METABOLIC PANEL
Anion gap: 8 (ref 5–15)
BUN: 36 mg/dL — ABNORMAL HIGH (ref 8–23)
CO2: 23 mmol/L (ref 22–32)
Calcium: 9.3 mg/dL (ref 8.9–10.3)
Chloride: 107 mmol/L (ref 98–111)
Creatinine, Ser: 1.46 mg/dL — ABNORMAL HIGH (ref 0.61–1.24)
GFR, Estimated: 51 mL/min — ABNORMAL LOW (ref >60)
Glucose, Bld: 94 mg/dL (ref 70–99)
Potassium: 5.3 mmol/L — ABNORMAL HIGH (ref 3.5–5.1)
Sodium: 138 mmol/L (ref 135–145)

## 2022-11-02 MED ORDER — BETHANECHOL CHLORIDE 25 MG PO TABS
25.0000 mg | ORAL_TABLET | Freq: Three times a day (TID) | ORAL | Status: DC
  Administered 2022-11-02 – 2022-11-03 (×3): 25 mg via ORAL
  Filled 2022-11-02 (×3): qty 1

## 2022-11-02 MED ORDER — TRAMADOL HCL 50 MG PO TABS: 50.0000 mg | ORAL_TABLET | Freq: Four times a day (QID) | ORAL | Status: DC | PRN

## 2022-11-02 MED ORDER — ACETAMINOPHEN 500 MG PO TABS
1000.0000 mg | ORAL_TABLET | Freq: Three times a day (TID) | ORAL | Status: DC
  Administered 2022-11-02 – 2022-11-09 (×21): 1000 mg via ORAL
  Filled 2022-11-02 (×21): qty 2

## 2022-11-02 NOTE — Progress Notes (Signed)
Occupational Therapy Session Note  Patient Details  Name: Frank Barron MRN: [id number] Date of Birth: 1950-08-16  Today's Date: 11/02/2022 Session 1 OT Individual Time: 4098-1191 OT Individual Time Calculation (min): 45 min   Session 2 OT Individual Time: 1330-1400 OT Individual Time Calculation (min): 30 min    Short Term Goals: Week 1:  OT Short Term Goal 1 (Week 1): Pt will don shirt with minA OT Short Term Goal 2 (Week 1): Pt will complete sit <> stand with LUE support, mod A OT Short Term Goal 3 (Week 1): Pt will complete LB dressing with max A +1  Skilled Therapeutic Interventions/Progress Updates: Session 1    Pt greeted semi-reclined in bed awake but would not make eye contact with OT and was not engaging, however pt did not refuse treatment and did initiate some with rolling for LB bathing and dressing w/ total A +2. Maximove transfer from bed to [name]. Pt brought to the sink in wc and needed hand over hand to initiate turning on water, then OT handed pt wash cloth and he did initiate washing face. Pt more engaging after getting up to wc. Max A for UB bathing/dressing but was attempting to assist within UB BADL tasks. Total A to don shoes. Pt left seated in [name] with nurse techs present for bladder scan.   Denies pain   Session 2 Pt greeted semi-reclined in bed. Affect more bright this afternoon. Pt stated "yes: to getting up to the wc. Pt initiated bed mobility moving LLE towards EOB and using L UE to assist with moving R UE towards EOB. Still needed max A to get to sitting. Max A to thread pants at EOB, then attempted partial stand with 2 person assist, able to get bottom slightly off bed while +2 assist to pull pants over hips. Stedy used for sit<>stand with max/total A +2 and strong push to the R in standing. Was able to get pt to [name] using Stedy with heavy +2 assist.  OT provided gentle stretching and ROM to R UE. OT placed SAEBO e-stim on shoulder to reduce sublux.  SAEBO left on for 60 minutes. OT returned to remove SAEBO with skin intact and no adverse reactions.  Saebo Stim One 330 pulse width 35 Hz pulse rate On 8 sec/ off 8 sec Ramp up/ down 2 sec Symmetrical Biphasic wave form  Max intensity at 500 Ohm load  Pt drank 4 oz cup of thickened water without coughing.   Pt left seated in [name] with alarm belt on, call ball in reach, and needs met.     Therapy Documentation Precautions:  Precautions Precautions: Fall Precaution Comments: R hemiplegia, aphasia (inconsistent yes/no) Restrictions Weight Bearing Restrictions: No Pain:  Indicated pain in R hip when standing. Rest and repositioned for pain management.    Therapy/Group: Individual Therapy  [name] 11/02/2022, 2:02 PM

## 2022-11-02 NOTE — Patient Care Conference (Signed)
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 Elijah Birk Social Worker Present: Cecile Sheerer, LCSWA Nurse Present: Vedia Pereyra, RN PT Present: Darrold Span, PT OT Present: Kearney Hard, OT SLP Present: Feliberto Gottron, SLP PPS Coordinator present : Edson Snowball, PT     Current Status/Progress Goal Weekly Team Focus  Bowel/Bladder   Pt incontinent B/B  with i/o cath>350 q6hrs bladder scan. LBM 11/02/22   Will regain B/B pattern   Assist with toileting and bladder scan q6hrs and cath over volumes 350cc    Swallow/Nutrition/ Hydration   Dys. 2 textures with nectar-thick liquids, Min-Mod A for use of swallowing strategies   Min A  tolerance of current diet, use of swallowing strategies, trials of upgraded textures/liquids    ADL's   Total A +2 for BADLs and transfers using maximove, pusher to the R, sitting balance in descent, min A   min-mod A overall   self-care retraining, activity tolernace, R NMR, NMES, barriers are no family support    Mobility   maxA bed mobility, totalA x2 transfers via maxi move   minA transfers, supervision WC  Barriers: decreased physical assist, STE. Continue to work on seated balance,  standing tolerance (standing frame), transfer training    Communication   Max-Total A for multimodal communication   Mod A   yes/no accuracy, following commands, use of multimodal communication    Safety/Cognition/ Behavioral Observations  Mod A   Mod A   sustained attention during functional tasks    Pain   Bilateral hip /thigh pains well managed with scheduled q6 Tramadol   Will be free from pain   Assess pt for pain qshift/prn    Skin   Skin is intact   Will mainatain skin intergrity  Assist with turning q2hrs and assess skin qshift/prn for breakdown      Discharge Planning:  Discharge is pending based on levle of care required at discharge. If pt discharges home at a supervision level, pt will have intermittent support from siblings. SW will confirm there are no barriers to discharge.   Team Discussion: Left MCA/CVA. Time toileting with urinary retention. Pain managed with PRN medications. Patient doesn't like taking pain medication and is concerned about addiction. Skin intact. Encouraging fluids. Sleep chart. Was using Stedy for transfers but using Maximove due to increased pushing. Barriers are aphasia with encephalopathy and not engaging during sessions. Does better in the morning. Tolerating D2 diet. Patient on target to meet rehab goals: Continuing to work towards goals with discharge date of 11/17/22  *See Care Plan and progress notes for long and short-term goals.   Revisions to Treatment Plan:  U/A with C&S. Medication adjustments. IV fluids. Monitor labs/VS  Teaching Needs: Medications, safety, self care,  gait/transfer training, etc.   Current Barriers to Discharge: Incontinence, Lack of/limited family support, and Behavior  Possible Resolutions to Barriers: Family education Retrain bowel/bladder Encourage PO intake Encourage pain medication or other alternatives to decrease pain Order recommended DME      Medical Summary Current Status: medically  complicated by AKI on CKD on IVF, aphasia with encephalopathy, pain/spasms, urinary retention, and orthostatic hypotension  Barriers to Discharge: Behavior/Mood;Electrolyte abnormality;Hypotension;Medical stability;Renal Insufficiency/Failure;Self-care education;Spasticity;Uncontrolled Pain;Neurogenic Bowel & Bladder  Barriers to Discharge Comments: urinary retention/neurogenic bladder, AKI on CKD with intermittent IVF, uncontrolled spasticity/pain, orthostatic hypotension Possible Resolutions to [employer] Focus: titrate medications and timed toiletting for retention, monitor labs and encourage PO intakes, titrate medications for spasticity and pain, limit sedating medications   Continued Need for Acute Rehabilitation Level of Care: The patient requires daily medical management by a physician with specialized training in physical medicine and rehabilitation for the following reasons: Direction of a multidisciplinary physical rehabilitation program to maximize functional independence : Yes Medical management of patient stability for increased activity during participation in an intensive rehabilitation regime.: Yes Analysis of laboratory values and/or radiology reports with any subsequent need for medication adjustment and/or medical intervention. : Yes   I attest that I was present, lead the team conference, and concur with the assessment and plan of the team.   Jearld Adjutant 11/02/2022, 2:02 PM

## 2022-11-02 NOTE — Progress Notes (Signed)
Urine specimen collected and sent to lab as ordere.d

## 2022-11-02 NOTE — Progress Notes (Signed)
PROGRESS NOTE   Subjective/Complaints:  Cr  improved with IV fluid, BUN remains uptrending, as well as K. Remains hypotensive, orthostatic.  However, p.o.'s remain excellent.  Per SLP, will drink water readily with encouragement, gust with therapies offering fluids today. UA ovenright negative except foir large amount of blood  3x ISC yesterday, medium BM yesterday  Seen in gym, working on standing and framed.  Denies any acute discomfort, doing well.  ROS: Difficult to ascertain due to aphasia; see HPI above  Objective:   No results found. Recent Labs    11/01/22 0557  WBC 4.1  HGB 13.1  HCT 39.8  PLT 220    Recent Labs    11/01/22 0557 11/02/22 0555  NA 136 138  K 4.9 5.3*  CL 106 107  CO2 22 23  GLUCOSE 88 94  BUN 31* 36*  CREATININE 1.62* 1.46*  CALCIUM 8.9 9.3     Intake/Output Summary (Last 24 hours) at 11/02/2022 0919 Last data filed at 11/02/2022 0000 Gross per 24 hour  Intake 180 ml  Output 1100 ml  Net -920 ml        Physical Exam: Vital Signs Blood pressure 96/78, pulse 73, temperature (!) 97.5 F (36.4 C), temperature source Oral, resp. rate 17, height 6\' 3"  (1.905 m), weight 97.6 kg, SpO2 95%.  General: No apparent distress.  Standing in frame with wheelchair behind him. HEENT: Head is normocephalic, atraumatic, PERRLA, mild left exotropia, difficulty with fixation neck: Supple without JVD or lymphadenopathy Heart: reg rate, irregularly irregular rhythm. No murmurs rubs or gallops Chest: CTA bilaterally without wheezes, rales, or rhonchi; no distress Abdomen: Soft, non-tender, non-distended, bowel sounds positive. Extremities: No clubbing, cyanosis, or edema.   Psych: Flat affect, difficult d/t aphasia Skin: Clean and intact without signs of breakdown over exposed surfaces MsK: no apparent deformities or tenderness Neuro: Orientation assessment limited by aphasia, is oriented to self  and place as hospital; year at 2024, month is August, cannot get day + expressive greater than receptive aphasia, still fairly severe + Right hemineglect CN: + right facial droop, decreased shoulder shrug on the right  Strength 5 out of 5 in left upper extremity left lower extremity, with exception of left hip flexion 3-/5 due to pain Strength antigravity out of in right upper extremity and right lower extremity during functional tasks with therapies Sensation to light touch decreased in right upper extremity and right lower extremity  Tone: Unable to assess today due to positioning MAS 1 R elbow flexors, shoulder adductors MAS 2 R hamstrings and right plantar flexors - reduced       Assessment/Plan: 1. Functional deficits which require 3+ hours per day of interdisciplinary therapy in a comprehensive inpatient rehab setting. Physiatrist is providing close team supervision and 24 hour management of active medical problems listed below. Physiatrist and rehab team continue to assess barriers to discharge/monitor patient progress toward functional and medical goals  Care Tool:  Bathing    Body parts bathed by patient: Chest, Abdomen, Right upper leg, Left upper leg, Face   Body parts bathed by helper: Right arm, Front perineal area, Buttocks, Right lower leg, Left lower leg  Bathing assist Assist Level: 2 Helpers     Upper Body Dressing/Undressing Upper body dressing   What is the patient wearing?: Pull over shirt    Upper body assist Assist Level: Maximal Assistance - Patient 25 - 49%    Lower Body Dressing/Undressing Lower body dressing      What is the patient wearing?: Pants, Incontinence brief     Lower body assist Assist for lower body dressing: 2 Helpers     Toileting Toileting    Toileting assist Assist for toileting: 2 Helpers     Transfers Chair/bed transfer  Transfers assist  Chair/bed transfer activity did not occur: Safety/medical  concerns  Chair/bed transfer assist level: Dependent - mechanical lift     Locomotion Ambulation   Ambulation assist   Ambulation activity did not occur: Safety/medical concerns          Walk 10 feet activity   Assist  Walk 10 feet activity did not occur: Safety/medical concerns        Walk 50 feet activity   Assist Walk 50 feet with 2 turns activity did not occur: Safety/medical concerns         Walk 150 feet activity   Assist Walk 150 feet activity did not occur: Safety/medical concerns         Walk 10 feet on uneven surface  activity   Assist Walk 10 feet on uneven surfaces activity did not occur: Safety/medical concerns         Wheelchair     Assist Is the patient using a wheelchair?: Yes Type of Wheelchair: Manual    Wheelchair assist level: Dependent - Patient 0% (pt unable to assist due to strength and tonal deficits)      Wheelchair 50 feet with 2 turns activity    Assist        Assist Level: Dependent - Patient 0%   Wheelchair 150 feet activity     Assist      Assist Level: Dependent - Patient 0%   Blood pressure 96/78, pulse 73, temperature (!) 97.5 F (36.4 C), temperature source Oral, resp. rate 17, height 6\' 3"  (1.905 m), weight 97.6 kg, SpO2 95%.  Medical Problem List and Plan: 1. 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 Pt has hx of prior CVA with residual aphasia.              -patient may shower             -ELOS/Goals: 14-18, PT/OT/SLP min A to sup             -Order PRAFO/WHO -utilize nightly             -Continue CIR  - 8/15 cognitive status mildly reduced from yesterday, is still within normal limits from admission given documentation but given CT head 8-12 with questionable IPH, recommended serial repeat, will repeat CT head without contrast today  -8-16: CT head without significant changes, doing better cognitively  today with encouragement, monitor  - 8/ 20: Cognition and participation waxes and wanes depending on day and time of day.  Overall, participates well but tends to "push"/flexor withdrawal with weightbearing on right leg which limits standing tolerance.   2.  Antithrombotics: -DVT/anticoagulation:  Pharmaceutical: Xarelto 20mg  daily w/breakfast?             -antiplatelet therapy: N/A  3. Pain Management/right sided spasticity:  Tylenol. Local measures.  --Per chart review was  on tramadol? Naltrexone? Lidocaine patches?    -8-15: Increase spasticity right lower extremity, add baclofen 10 mg 3 times daily -8-16: Pain in right lower extremity, spasticity versus neuropathic, start gabapentin 100 mg 3 times daily.  Check renal function on Monday 8/19: BL LE pain, aching today; increase tramadol to 50 mg BID; cannot increase baclofen/gabapentin d/t aki 8/20: Using PRN tylenol frequently; schedule tylenol 1000 mg TID, switch tramadol to 50 mg Q6H PRN  4. Mood/Behavior/Sleep: LCSW to follow for evaluation and support.              -antipsychotic agents: N/A  5. Neuropsych/cognition: This patient is not capable of making decisions on his own behalf.  6. Skin/Wound Care: Routine pressure relief measures  7. Fluids/Electrolytes/Nutrition/Dysphagia: Monitor I/O. Check CMET in am             --assist with meals  -8-15: Labs improved, discontinue IV fluid 50 cc/h; check Monday  eating 75-100% meals per report  -therapies to work on encouraging p.o. fluids today  8. HTN: Home meds Amlodipine 5mg , Hydralazine 25mg  daily,  goal SBP < 180, long term goal normotensive -Monitor BP TID continue amlodipine - stable -8-15: Has been mostly normotensive, will reduce amlodipine back to 5 mg as perfusion difficulty may be contributing to cognitive difficulties at this time. -8-16: Remains hypotensive today, will DC amlodipine; no current medications -8/17-18/24 BPs improving, monitor 8/19: Orthostatic; IVF, TEDs,  Binder, currently not on PO BP medications  Vitals:   10/29/22 0555 10/29/22 1505 10/29/22 1939 10/30/22 0530  BP: 126/87 107/77 117/79 122/84   10/30/22 2031 10/31/22 0409 10/31/22 1634 10/31/22 2010  BP: 108/72 105/79 113/78 106/86   11/01/22 0509 11/01/22 1345 11/01/22 2010 11/02/22 0534  BP: 114/77 105/73 107/78 96/78     9. A fib: Monitor HR TID. On xarelto. Heart rate controlled  -10/30/22 rate controlled  10.  Thrombocytopenia: Recheck platelets in am. Monitor for signs of bleeding.-- plt 156 on 10/28/22  11. Dysphagia: On D2,thins. Encourage fluid intake   - see above  12. Acute on chronic renal failure:  SCr 1.32 at admission. Appears baseline 1.3-1.4 --Continue to monitor and encourage intake  - uptrending on last labs 8/12, encourage fluids, BMP in AM -stable, reassess q. Monday - 8/19: AKI Cr 1.6 again; IVF today 75 cc/hr - 8/20: 1.4 today, DC IVF, POs excellent  13. Hyponatremia: Na dropped to 132 in 48 hours. Recheck in am   - 8-15: Stable 132-135, monitor   8/19: 136; resolved  14.  H/o CAD s/p PCI: Monitor for symptoms with increase in activity.             --continue Crestor 40mg  daily  15.  Situational depression: Lexapro 10mg  added on acute.    - Also benefit for motor recovery of UE; would not adjust  16. Hx of multiple cancers: R-RCC s/p nephrectomy 2015.  -Right adrenal pheochromocytoma  s/p right adrenalectomy 2017.  -Prostate CA s/p IMRT 2018.  Continue flomax -RUL nodule being monitored.  17. H/o Emphysema?: Noted on CT chest on care everywhere.    - stable  18. HLD. Continue statin as above  19. Substance abuse. Encourage cessation  20.  Urinary retention.  On Flomax 0.4 mg at baseline, increased to 0.8 mg today -10/30/22 Remains incontinent but low PVRs, encourage up to bedside and monitor-- could claritin be interfering? -10/31/22 needed caths overnight; defer adjustments to weekday team 8/19: Add bethenacol 10 mg TID for retention 8/20:  Increase to  25 mg TID  21. Constipation: -10/30/22 reported BM 2 days ago but looks like he had two yesterday but none for 2 days prior. Will schedule senokot 1 tab daily and add miralax daily PRN for now-- monitor, if oversoftened/too frequent with senokot then may just switch back to PRN but pt will have difficult time asking for PRNs -10/31/22 LBM yesterday, however has been smears since 8/17; increase bowel regimen to sennakpot-s 2 tabs BID + miralax BID  LOS: 7 days A FACE TO FACE EVALUATION WAS PERFORMED  Angelina Sheriff 11/02/2022, 9:19 AM

## 2022-11-02 NOTE — Progress Notes (Addendum)
Patient ID: Frank Barron, male   DOB: February 13, 1951, 72 y.o.   MRN: [id number]   1155- SW spoke with pt dtr [name] and brother [name] to provide updates from team conference, and d/c date 9/4. SW informed on current Max level of care, and likely at discharge anticipated Min Ass-Mod Asst. Reports pt wife is not going to be able to care for him due to her own disabilities. SW discussed in length [facility] services offered through [facility] vs [place]. SW asked family to confirm preferred insurance to use. SW discussed family edu next week Tuesday or Wednesday. Pt brother [name] to discuss with brother's wife and will follow-up with SW.   SW left message for Lowry Ram [phone] ext. [phone]  to discuss request for aide.  *SW received updates from [place] SW Cassie reporting she will put in aide request.   Cecile Sheerer, MSW, LCSWA Office: [phone] Cell: [phone] Fax: [phone]

## 2022-11-02 NOTE — Progress Notes (Signed)
Speech Language Pathology Daily Session Note  Patient Details  Name: Frank Barron MRN: [id number] Date of Birth: 01/06/1951  Today's Date: 11/02/2022 SLP Individual Time: 0730-0820 SLP Individual Time Calculation (min): 50 min Missed Time: 10 mins, fatigue  Short Term Goals: Week 1: SLP Short Term Goal 1 (Week 1): Patient will utilize compensatory strategies to consume dys2 / thin liquids with no overt s/sx of aspiration with maxA SLP Short Term Goal 2 (Week 1): Patient will utilize multimodal means of communication to express wants/needs with maxA SLP Short Term Goal 3 (Week 1): Patient will participate in automatic speech tasks with 25% accuracy and max multimodal A SLP Short Term Goal 4 (Week 1): Patient will increase word finding of common objects using compensatory strategies with max multimodal A SLP Short Term Goal 5 (Week 1): Patinet will demonstrate sustained attention with max multimodal A for 5 minutes during functional tasks  Skilled Therapeutic Interventions: Skilled treatment session focused on dysphagia and communication goals. Upon arrival, patient was awake but appeared mildly lethargic while upright in bed. Patient with minimal eye contact with SLP in left visual field throughout session despite Max A multimodal cues. Max A multimodal cues were also needed for patient to engage in basic exchange with head nods or simple yes/no verbalizations in regards to wants and needs. SLP assisted patient with self-feeding with breakfast meal of Dys. 2 textures with nectar-thick liquids. Patient without overt s/s of aspiration but required Mod verbal cues for small bites/sips and a slow rate of self-feeding. Patient with decreased PO intake today with SLP providing oral care via the suction toothbrush at end of session to remove mild oral residue. Recommend patient continue current diet with full supervision. Patient missed remaining 10 mins of session due to fatigue and limited engagement.  Nursing made aware. Patient left upright in bed with alarm on and all needs within reach. Continue with current plan of care.      Pain No/Denies Pain  Therapy/Group: Individual Therapy  [name] 11/02/2022, 8:37 AM

## 2022-11-02 NOTE — Progress Notes (Signed)
Physical Therapy Session Note  Patient Details  Name: Frank Barron MRN: [id number] Date of Birth: 1950/04/05  Today's Date: 11/02/2022 PT Individual Time: 1051-1207 PT Individual Time Calculation (min): 76 min   Short Term Goals: Week 1:  PT Short Term Goal 1 (Week 1): Pt will complete supine to sit EOB min assist PT Short Term Goal 2 (Week 1): Pt will complete sit<>stand mod assist PT Short Term Goal 3 (Week 1): Pt will complete bed<>WC transfer with mod assist and LRAD PT Short Term Goal 4 (Week 1): Pt will initiate WC mobility training  Skilled Therapeutic Interventions/Progress Updates:    Pt presents in room in [name], agreeable to PT via head nod. Pt unable to report pain. Session focused on NMR for midline orientation in standing as well as standing tolerance and WC mobility for BLE coordination.  Pt transported to day room and positioned in front of standing frame. Pt completes standing 8 min, 6 min, and 4 min with pt demonstrating increased LUE pushing with immediate standing, improves with increased time in position and with reaching activity with pt reaching for squigz.positioned to L side and overhead to promote L lateral lean and facilitate upright posturing and posterior chain activation.  Pt requires extended seated rest break between standing trials due to fatigue and for monitoring vitals (noted below).  Pt then participates with WC mobility for utilizing BLEs to propel, to promote BLE coordination and facilitate participation with mobility. Pt requires max assist for propelling WC, therapist with manual facilitation for propelling BLEs initially with pt able to initiate use of LLE without assist however pt continues to require max assist for RLE facilitation. Pt propels ~120' max assist.  Pt returns to room and remains seated in [facility] with all needs within reach, cal light in place and chair alarm donned and activated at end of session. Pt indicates needing something,  increased time required for communication secondary to aphasia to determine pt requesting to don glasses.  Therapy Documentation Precautions:  Precautions Precautions: Fall Precaution Comments: R hemiplegia, aphasia (inconsistent yes/no) Restrictions Weight Bearing Restrictions: No    Therapy/Group: Individual Therapy  Edwin Cap PT, DPT 11/02/2022, 12:25 PM

## 2022-11-03 ENCOUNTER — Inpatient Hospital Stay (HOSPITAL_COMMUNITY): Payer: No Typology Code available for payment source

## 2022-11-03 DIAGNOSIS — I63512 Cerebral infarction due to unspecified occlusion or stenosis of left middle cerebral artery: Secondary | ICD-10-CM | POA: Diagnosis not present

## 2022-11-03 MED ORDER — BETHANECHOL CHLORIDE 25 MG PO TABS
25.0000 mg | ORAL_TABLET | Freq: Four times a day (QID) | ORAL | Status: DC
  Administered 2022-11-03 – 2022-11-04 (×5): 25 mg via ORAL
  Filled 2022-11-03 (×5): qty 1

## 2022-11-03 NOTE — Progress Notes (Signed)
Physical Therapy Session Note  Patient Details  Name: Frank Barron MRN: [id number] Date of Birth: March 14, 1951  Today's Date: 11/03/2022 PT Individual Time: 4132-4401 PT Individual Time Calculation (min): 72 min   Short Term Goals: Week 1:  PT Short Term Goal 1 (Week 1): Pt will complete supine to sit EOB min assist PT Short Term Goal 2 (Week 1): Pt will complete sit<>stand mod assist PT Short Term Goal 3 (Week 1): Pt will complete bed<>WC transfer with mod assist and LRAD PT Short Term Goal 4 (Week 1): Pt will initiate WC mobility training  Skilled Therapeutic Interventions/Progress Updates:     Pt received seated in tilt in space WC and agrees to therapy. Indicates pain in Rt hip. PT provides rest breaks as needed to manage pain. WC transport to gym for time management. Pt performs sit to stand with maxA +2 and cues for anterior weight shifting, initiation, and sequencing. Pt able to achieve ~75% of full stand and grunts and groans while in standing, also pushing firmly to the Rt. Seated rest break. Squat pivot to the Rt with maxA +2 and demonstration from PT. PT performs sitting balance training in short sitting at edge of mat, demonstrating relatively good midline orientation in sitting, with only slight Rt sided lean. Pt performs anterior weight shifting with platform placed between legs, with cue to retrieve pegs and place in box positioned on Rt side to encourage Rt sided attention and weight shifting. Pt requires max cueing and demonstration to correctly perform. Pt then performs repeated sit-to-squat transitions to promote WB through Rt hemibody. Pt has difficulty attending to task and has inconsistent ability to motor plan movements. PT utilizes sheet around hips to provide leverage and assist with anterior weight shifting. Pt completes 3x5. Pt then performs squat pivot to WC with maxA +2 and same cues. Pt participates in standing frame activity for WB through BLEs and engagement of hip  extensors and core muscles. PT provides cues for posture, as well as foot placement and verbal and tactile cues for hip extension. WC transport back to room. Squat pivot to bed with max/totalA+1. TotalA +2 for sit to supine. Left with alarm intact and all needs within reach.   Therapy Documentation Precautions:  Precautions Precautions: Fall Precaution Comments: R hemiplegia, aphasia (inconsistent yes/no) Restrictions Weight Bearing Restrictions: No   Therapy/Group: Individual Therapy  Beau Fanny, PT, DPT 11/03/2022, 4:19 PM

## 2022-11-03 NOTE — Progress Notes (Signed)
Occupational Therapy Session Note  Patient Details  Name: Frank Barron MRN: [id number] Date of Birth: 28-Nov-1950  Today's Date: 11/03/2022 OT Individual Time: 4098-1191 OT Individual Time Calculation (min): 30 min    Short Term Goals: Week 1:  OT Short Term Goal 1 (Week 1): Pt will don shirt with minA OT Short Term Goal 2 (Week 1): Pt will complete sit <> stand with LUE support, mod A OT Short Term Goal 3 (Week 1): Pt will complete LB dressing with max A +1  Skilled Therapeutic Interventions/Progress Updates:    Pt received supine with no c/o pain, agreeable to OT session via nodding. He demonstrated difficulty motor planning rolling to the R and required mod A as well as facilitation for LUE placement. He was able to maintain sidelying with min A occasionally. Guided pt through RUE elbow flexion/extension to encourage visual attention to RUE and to encourage R UE NMR. He demonstrated no active movement in the bicep or tricep. He was able to flex fingers with cueing. He demonstrated sudden reflexive flexor synergy in the RUE while grunting and odor of BM became evident. He required max A to roll R and L with +2 assist and total A for peri hygiene to clean incontinent BM and urine. He was left supine with all needs met, bed alarm set.   Therapy Documentation Precautions:  Precautions Precautions: Fall Precaution Comments: R hemiplegia, aphasia (inconsistent yes/no) Restrictions Weight Bearing Restrictions: No    Therapy/Group: Individual Therapy  Crissie Reese 11/03/2022, 3:03 PM

## 2022-11-03 NOTE — Progress Notes (Signed)
PROGRESS NOTE   Subjective/Complaints:  Large BM yesterday Remains with ISC for all voids, UA yesterday negative. Renal US today to look for causes of retention since 8/16, bethenachol increasd to 25 mg QID. Fluid intakes improved yesteryda, BP low but no orthostasis. If recurrent would DC bethenachol and place foley Denies any issues today, endorses pain but cannot specify further location or quality.  When asked to point at painful area, does not indicate appropriately.  ROS: Difficult to ascertain due to aphasia; see HPI above  Objective:   No results found. Recent Labs    11/01/22 0557  WBC 4.1  HGB 13.1  HCT 39.8  PLT 220    Recent Labs    11/01/22 0557 11/02/22 0555  NA 136 138  K 4.9 5.3*  CL 106 107  CO2 22 23  GLUCOSE 88 94  BUN 31* 36*  CREATININE 1.62* 1.46*  CALCIUM 8.9 9.3     Intake/Output Summary (Last 24 hours) at 11/03/2022 0856 Last data filed at 11/03/2022 0824 Gross per 24 hour  Intake 1072 ml  Output 1275 ml  Net -203 ml        Physical Exam: Vital Signs Blood pressure 125/84, pulse 65, temperature 97.7 F (36.5 C), temperature source Oral, resp. rate 18, height 6\' 3"  (1.905 m), weight 97.6 kg, SpO2 100%.  General: No apparent distress.  Sitting in bedside chair, eating. HEENT: Head is normocephalic,  mild left exotropia, difficulty with fixation Heart: reg rate, irregularly irregular rhythm. No murmurs rubs or gallops Chest: CTA bilaterally without wheezes, rales, or rhonchi; no distress Abdomen: Soft, non-tender, non-distended, bowel sounds positive. Extremities: No clubbing, cyanosis, or edema.   Psych: Flat affect, difficult d/t aphasia Skin: Clean and intact without signs of breakdown MsK: no apparent deformities or tenderness Neuro: Orientation assessment limited by aphasia, is oriented to self and place as hospital; not oriented to date or year with options + expressive  greater than receptive aphasia, still fairly severe + Right hemineglect -moderate, able to overcome with minimal stimulation CN: + right facial droop, decreased shoulder shrug on the right  Strength 5 out of 5 in left upper extremity left lower extremity, with exception of left hip flexion 3-/5 due to pain Strength antigravity out of in right upper extremity and right lower extremity during functional tasks with therapies Sensation to light touch decreased in right upper extremity and right lower extremity  MAS 1 R elbow flexors, shoulder adductors MAS 1+ R hamstrings and right plantar flexors -improving       Assessment/Plan: 1. Functional deficits which require 3+ hours per day of interdisciplinary therapy in a comprehensive inpatient rehab setting. Physiatrist is providing close team supervision and 24 hour management of active medical problems listed below. Physiatrist and rehab team continue to assess barriers to discharge/monitor patient progress toward functional and medical goals  Care Tool:  Bathing    Body parts bathed by patient: Chest, Abdomen, Right upper leg, Left upper leg, Face   Body parts bathed by helper: Right arm, Front perineal area, Buttocks, Right lower leg, Left lower leg     Bathing assist Assist Level: 2 Helpers     Upper Body  Dressing/Undressing Upper body dressing   What is the patient wearing?: Pull over shirt    Upper body assist Assist Level: Maximal Assistance - Patient 25 - 49%    Lower Body Dressing/Undressing Lower body dressing      What is the patient wearing?: Pants, Incontinence brief     Lower body assist Assist for lower body dressing: 2 Helpers     Toileting Toileting    Toileting assist Assist for toileting: 2 Helpers     Transfers Chair/bed transfer  Transfers assist  Chair/bed transfer activity did not occur: Safety/medical concerns  Chair/bed transfer assist level: Dependent - mechanical lift      Locomotion Ambulation   Ambulation assist   Ambulation activity did not occur: Safety/medical concerns          Walk 10 feet activity   Assist  Walk 10 feet activity did not occur: Safety/medical concerns        Walk 50 feet activity   Assist Walk 50 feet with 2 turns activity did not occur: Safety/medical concerns         Walk 150 feet activity   Assist Walk 150 feet activity did not occur: Safety/medical concerns         Walk 10 feet on uneven surface  activity   Assist Walk 10 feet on uneven surfaces activity did not occur: Safety/medical concerns         Wheelchair     Assist Is the patient using a wheelchair?: Yes Type of Wheelchair: Manual    Wheelchair assist level: Dependent - Patient 0% (pt unable to assist due to strength and tonal deficits)      Wheelchair 50 feet with 2 turns activity    Assist        Assist Level: Dependent - Patient 0%   Wheelchair 150 feet activity     Assist      Assist Level: Dependent - Patient 0%   Blood pressure 125/84, pulse 65, temperature 97.7 F (36.5 C), temperature source Oral, resp. rate 18, height 6\' 3"  (1.905 m), weight 97.6 kg, SpO2 100%.  Medical Problem List and Plan: 1. 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 Pt has hx of prior CVA with residual aphasia.              -patient may shower             -ELOS/Goals: 14-18, PT/OT/SLP min A to sup             -Order PRAFO/WHO -utilize nightly             -Continue CIR  - 8/15 cognitive status mildly reduced from yesterday, is still within normal limits from admission given documentation but given CT head 8-12 with questionable IPH, recommended serial repeat, will repeat CT head without contrast today  -8-16: CT head without significant changes, doing better cognitively today with encouragement, monitor  - 8/ 20: Cognition and participation waxes  and wanes depending on day and time of day.  Overall, participates well but tends to "push"/flexor withdrawal with weightbearing on right leg which limits standing tolerance.   2.  Antithrombotics: -DVT/anticoagulation:  Pharmaceutical: Xarelto 20mg  daily w/breakfast?             -antiplatelet therapy: N/A  3. Pain Management/right sided spasticity:  Tylenol. Local measures.  --Per chart review was on tramadol? Naltrexone? Lidocaine patches?    -8-15: Increase spasticity right lower  extremity, add baclofen 10 mg 3 times daily -8-16: Pain in right lower extremity, spasticity versus neuropathic, start gabapentin 100 mg 3 times daily.  Check renal function on Monday 8/19: BL LE pain, aching today; increase tramadol to 50 mg BID; cannot increase baclofen/gabapentin d/t aki 8/20: Using PRN tylenol frequently; schedule tylenol 1000 mg TID, switch tramadol to 50 mg Q6H PRN 8-21: Cognition is improved and pain seems fairly well-controlled, monitor  4. Mood/Behavior/Sleep: LCSW to follow for evaluation and support.              -antipsychotic agents: N/A  5. Neuropsych/cognition: This patient is not capable of making decisions on his own behalf.  6. Skin/Wound Care: Routine pressure relief measures  7. Fluids/Electrolytes/Nutrition/Dysphagia: Monitor I/O. Check CMET in am             --assist with meals  -8-15: Labs improved, discontinue IV fluid 50 cc/h; check Monday  eating 75-100% meals per report  -therapies to work on encouraging p.o. fluids today  8. HTN: Home meds Amlodipine 5mg , Hydralazine 25mg  daily,  goal SBP < 180, long term goal normotensive -Monitor BP TID continue amlodipine - stable -8-15: Has been mostly normotensive, will reduce amlodipine back to 5 mg as perfusion difficulty may be contributing to cognitive difficulties at this time. -8-16: Remains hypotensive today, will DC amlodipine; no current medications -8/17-18/24 BPs improving, monitor 8/19: Orthostatic; IVF, TEDs,  Binder, currently not on PO BP medications  Vitals:   10/30/22 0530 10/30/22 2031 10/31/22 0409 10/31/22 1634  BP: 122/84 108/72 105/79 113/78   10/31/22 2010 11/01/22 0509 11/01/22 1345 11/01/22 2010  BP: 106/86 114/77 105/73 107/78   11/02/22 0534 11/02/22 1436 11/02/22 2002 11/03/22 0424  BP: 96/78 104/82 111/78 125/84     9. A fib: Monitor HR TID. On xarelto. Heart rate controlled  -10/30/22 rate controlled  10.  Thrombocytopenia: Recheck platelets in am. Monitor for signs of bleeding.-- plt 156 on 10/28/22  11. Dysphagia: On D2,thins. Encourage fluid intake   - see above  12. Acute on chronic renal failure:  SCr 1.32 at admission. Appears baseline 1.3-1.4 --Continue to monitor and encourage intake  - uptrending on last labs 8/12, encourage fluids, BMP in AM -stable, reassess q. Monday - 8/19: AKI Cr 1.6 again; IVF today 75 cc/hr - 8/20: 1.4 today, DC IVF, POs excellent  13. Hyponatremia: Na dropped to 132 in 48 hours. Recheck in am   - 8-15: Stable 132-135, monitor   8/19: 136; resolved  14.  H/o CAD s/p PCI: Monitor for symptoms with increase in activity.             --continue Crestor 40mg  daily  15.  Situational depression: Lexapro 10mg  added on acute.    - Also benefit for motor recovery of UE; would not adjust  16. Hx of multiple cancers: R-RCC s/p nephrectomy 2015.  -Right adrenal pheochromocytoma  s/p right adrenalectomy 2017.  -Prostate CA s/p IMRT 2018.  Continue flomax -RUL nodule being monitored.  17. H/o Emphysema?: Noted on CT chest on care everywhere.    - stable  18. HLD. Continue statin as above  19. Substance abuse. Encourage cessation  20.  Urinary retention.  On Flomax 0.4 mg at baseline, increased to 0.8 mg today -10/30/22 Remains incontinent but low PVRs, encourage up to bedside and monitor-- could claritin be interfering? -10/31/22 needed caths overnight; defer adjustments to weekday team 8/19: Add bethenacol 10 mg TID for retention 8/20:  Increase to 25 mg TID  8-21: Increase bethanechol to 25 mg 3 times daily, and obtain renal ultrasound to ensure no obstruction.  If no improvement in 24 hours, will replace Foley.  21. Constipation: -10/30/22 reported BM 2 days ago but looks like he had two yesterday but none for 2 days prior. Will schedule senokot 1 tab daily and add miralax daily PRN for now-- monitor, if oversoftened/too frequent with senokot then may just switch back to PRN but pt will have difficult time asking for PRNs -10/31/22 LBM yesterday, however has been smears since 8/17; increase bowel regimen to sennakpot-s 2 tabs BID + miralax BID Last bowel movement 8-20  LOS: 8 days A FACE TO FACE EVALUATION WAS PERFORMED  Angelina Sheriff 11/03/2022, 8:56 AM

## 2022-11-03 NOTE — Progress Notes (Signed)
Occupational Therapy Session Note  Patient Details  Name: Frank Barron MRN: [id number] Date of Birth: [birth year]  Today's Date: 11/03/2022 OT Individual Time: 1121-1201 OT Individual Time Calculation (min): 40 min    Short Term Goals: Week 1:  OT Short Term Goal 1 (Week 1): Pt will don shirt with minA OT Short Term Goal 2 (Week 1): Pt will complete sit <> stand with LUE support, mod A OT Short Term Goal 3 (Week 1): Pt will complete LB dressing with max A +1  Skilled Therapeutic Interventions/Progress Updates:  Pt greeted supine in bed, pt following commands during session and nodding/gesturing appropriately. Donned pants from supine with MAXA with pt rolling R<>L with MIN A to L and MOD A to R side. Utilized maxi move for OOB dependent transfer.   Once in TIS, worked on self care at sink with an emphasis on postural control and R sided attention with pt able to lean forward to turn on/off water and wash face with initial hand over hand assist but progressed to supervision. Pt washed RUE and applied lotion with MOD cueing to initiate task but then only needed supervision. Pt completed oral care with pt able to apply toothpaste to brush and brush teeth with with supervision for safety d/t liquid restrictons. Donned new shirt with pt needing assist to thread RUE but able to complete remainder of task with MODA.   Ended session with pt up in TIS with alarm belt activated and all needs within reach.   Therapy Documentation Precautions:  Precautions Precautions: Fall Precaution Comments: R hemiplegia, aphasia (inconsistent yes/no) Restrictions Weight Bearing Restrictions: No  Pain: pt grimaced when bending RLE during rolling, decreased ROM for pain mgmt.     Therapy/Group: Individual Therapy  Pollyann Glen [facility] 11/03/2022, 12:08 PM

## 2022-11-03 NOTE — Progress Notes (Signed)
Speech Language Pathology Daily Session Note  Patient Details  Name: [name] MRN: [id number] Date of Birth: Nov 03, 1950  Today's Date: 11/03/2022 SLP Individual Time: 1000-1020 SLP Individual Time Calculation (min): 20 min and Today's Date: 11/03/2022 SLP Missed Time: 40 Minutes Missed Time Reason:  pt fatigue/refusal  Short Term Goals: Week 1: SLP Short Term Goal 1 (Week 1): Patient will utilize compensatory strategies to consume dys2 / thin liquids with no overt s/sx of aspiration with maxA SLP Short Term Goal 2 (Week 1): Patient will utilize multimodal means of communication to express wants/needs with maxA SLP Short Term Goal 3 (Week 1): Patient will participate in automatic speech tasks with 25% accuracy and max multimodal A SLP Short Term Goal 4 (Week 1): Patient will increase word finding of common objects using compensatory strategies with max multimodal A SLP Short Term Goal 5 (Week 1): Patinet will demonstrate sustained attention with max multimodal A for 5 minutes during functional tasks  Skilled Therapeutic Interventions:   Pt greeted at bedside as staff was completing bedside imaging. SLP assisted to reposition pt after imaging, however, no acknowledgement of SLP was noted by pt. Despite being on pt's L side, he did not look at SLP and kept his eyes closed. Pt able to open his eyes with maxA tactile/verbal cueing, though unable to keep his eyes open for more than ~5 seconds. He was able to answer initial Y/N questions re care via head nod, as he responded "no" to pain but "yes" to not feeling well. However, no responses noted to additional questions re why he didn't feel well or anything SLP could do to assist. Suspect limited responsiveness was behavioral, however, unable to determine if true fatigue vs behavioral/refusal to participate as pt is unknown to this SLP. Unable to continue tx tasks 2* lack of pt responses. He was left in his bed with the alarm set and call light  within reach. Nursing notified. Recommend cont ST per POC.   Pain  Shook his head "no" to indicate no pain  Therapy/Group: Individual Therapy  Frank Barron 11/03/2022, 10:27 AM

## 2022-11-04 ENCOUNTER — Encounter (HOSPITAL_COMMUNITY): Payer: Self-pay | Admitting: Physical Medicine and Rehabilitation

## 2022-11-04 DIAGNOSIS — I63512 Cerebral infarction due to unspecified occlusion or stenosis of left middle cerebral artery: Secondary | ICD-10-CM | POA: Diagnosis not present

## 2022-11-04 LAB — BASIC METABOLIC PANEL
Anion gap: 10 (ref 5–15)
BUN: 36 mg/dL — ABNORMAL HIGH (ref 8–23)
CO2: 24 mmol/L (ref 22–32)
Calcium: 9.2 mg/dL (ref 8.9–10.3)
Chloride: 101 mmol/L (ref 98–111)
Creatinine, Ser: 1.3 mg/dL — ABNORMAL HIGH (ref 0.61–1.24)
GFR, Estimated: 59 mL/min — ABNORMAL LOW (ref >60)
Glucose, Bld: 94 mg/dL (ref 70–99)
Potassium: 4.6 mmol/L (ref 3.5–5.1)
Sodium: 135 mmol/L (ref 135–145)

## 2022-11-04 LAB — CBC
HCT: 39.8 % (ref 39.0–52.0)
Hemoglobin: 13.3 g/dL (ref 13.0–17.0)
MCH: 30.6 pg (ref 26.0–34.0)
MCHC: 33.4 g/dL (ref 30.0–36.0)
MCV: 91.5 fL (ref 80.0–100.0)
Platelets: 238 10*3/uL (ref 150–400)
RBC: 4.35 MIL/uL (ref 4.22–5.81)
RDW: 14.1 % (ref 11.5–15.5)
WBC: 4.9 10*3/uL (ref 4.0–10.5)
nRBC: 0 % (ref 0.0–0.2)

## 2022-11-04 MED ORDER — CHLORHEXIDINE GLUCONATE CLOTH 2 % EX PADS
6.0000 | MEDICATED_PAD | Freq: Two times a day (BID) | CUTANEOUS | Status: AC
  Administered 2022-11-04 – 2022-11-14 (×19): 6 via TOPICAL

## 2022-11-04 NOTE — Progress Notes (Signed)
PROGRESS NOTE   Subjective/Complaints:  Vitals stable. No events overnight.  Cr improved, BUN stable 36; ? Due to nutritional supplements Remains ISC for all voids; discussed with the patient, he denies any sensation of needing to void or the ability to void when attempting.  He does endorse past medical history of urinary difficulties prior to his stroke.  Renal ultrasound yesterday without any apparent obstruction, does show trabeculated and thickened bladder.   ROS: Difficult to ascertain due to aphasia; see HPI above  Objective:   US RENAL  Result Date: 11/03/2022 CLINICAL DATA:  Urinary retention EXAM: RENAL / URINARY TRACT ULTRASOUND COMPLETE COMPARISON:  Noncontrast CT 01/01/2016 FINDINGS: Right Kidney: Surgically absent as per history. No abnormal soft tissue in the surgical bed. Left Kidney: Renal measurements: 11.4 x 5.9 x 5.7 cm = volume: 199.1 mL. Echogenicity within normal limits. No mass or hydronephrosis visualized. Bladder: Distended urinary bladder with slight wall thickening and trabeculation. Left ureteral jet is seen. Other: None. IMPRESSION: Previous right nephrectomy. No left-sided collecting system dilatation. Distended urinary bladder with some wall thickening and trabeculation Electronically Signed   By: Karen Kays M.D.   On: 11/03/2022 13:49   Recent Labs    11/04/22 0553  WBC 4.9  HGB 13.3  HCT 39.8  PLT 238    Recent Labs    11/02/22 0555 11/04/22 0553  NA 138 135  K 5.3* 4.6  CL 107 101  CO2 23 24  GLUCOSE 94 94  BUN 36* 36*  CREATININE 1.46* 1.30*  CALCIUM 9.3 9.2     Intake/Output Summary (Last 24 hours) at 11/04/2022 0925 Last data filed at 11/04/2022 0811 Gross per 24 hour  Intake 840 ml  Output 625 ml  Net 215 ml        Physical Exam: Vital Signs Blood pressure (!) 119/94, pulse 63, temperature 97.8 F (36.6 C), resp. rate 18, height 6\' 3"  (1.905 m), weight 97.6 kg, SpO2  100%.  General: No apparent distress.  Sitting in bedside chair, reclined, back to get cath. HEENT: Head is normocephalic,  mild left exotropia, difficulty with fixation Heart: reg rate, irregularly irregular rhythm. No murmurs rubs or gallops Chest: CTA bilaterally without wheezes, rales, or rhonchi; no distress Abdomen: Soft, non-tender, non-distended, bowel sounds positive. Extremities: No clubbing, cyanosis, or edema.   Psych: Flat affect, difficult d/t aphasia Skin: Clean and intact without signs of breakdown MsK: no apparent deformities or tenderness Neuro: Orientation assessment limited by aphasia, is oriented to self and place as hospital; not oriented to date or year with options + expressive greater than receptive aphasia, able to follow simple commands 2-3 + Right hemineglect -moderate, able to overcome with minimal stimulation CN: + right facial droop, decreased shoulder shrug on the right  Strength 5 out of 5 in left upper extremity left lower extremity, with exception of left hip flexion 3-/5 due to pain Strength reduced in right upper and lower extremity, limited somewhat by neglect but antigravity right upper extremity and not antigravity right lower extremity   sensation to light touch decreased in right upper extremity and right lower extremity  MAS 1 R elbow flexors, shoulder adductors MAS 1+ R  hamstrings and right plantar flexors -improved       Assessment/Plan: 1. Functional deficits which require 3+ hours per day of interdisciplinary therapy in a comprehensive inpatient rehab setting. Physiatrist is providing close team supervision and 24 hour management of active medical problems listed below. Physiatrist and rehab team continue to assess barriers to discharge/monitor patient progress toward functional and medical goals  Care Tool:  Bathing    Body parts bathed by patient: Chest, Abdomen, Right upper leg, Left upper leg, Face   Body parts bathed by helper:  Right arm, Front perineal area, Buttocks, Right lower leg, Left lower leg     Bathing assist Assist Level: 2 Helpers     Upper Body Dressing/Undressing Upper body dressing   What is the patient wearing?: Pull over shirt    Upper body assist Assist Level: Moderate Assistance - Patient 50 - 74%    Lower Body Dressing/Undressing Lower body dressing      What is the patient wearing?: Pants     Lower body assist Assist for lower body dressing: Maximal Assistance - Patient 25 - 49%     Toileting Toileting    Toileting assist Assist for toileting: 2 Helpers     Transfers Chair/bed transfer  Transfers assist  Chair/bed transfer activity did not occur: Safety/medical concerns  Chair/bed transfer assist level: Dependent - mechanical lift     Locomotion Ambulation   Ambulation assist   Ambulation activity did not occur: Safety/medical concerns          Walk 10 feet activity   Assist  Walk 10 feet activity did not occur: Safety/medical concerns        Walk 50 feet activity   Assist Walk 50 feet with 2 turns activity did not occur: Safety/medical concerns         Walk 150 feet activity   Assist Walk 150 feet activity did not occur: Safety/medical concerns         Walk 10 feet on uneven surface  activity   Assist Walk 10 feet on uneven surfaces activity did not occur: Safety/medical concerns         Wheelchair     Assist Is the patient using a wheelchair?: Yes Type of Wheelchair: Manual    Wheelchair assist level: Dependent - Patient 0% (pt unable to assist due to strength and tonal deficits)      Wheelchair 50 feet with 2 turns activity    Assist        Assist Level: Dependent - Patient 0%   Wheelchair 150 feet activity     Assist      Assist Level: Dependent - Patient 0%   Blood pressure (!) 119/94, pulse 63, temperature 97.8 F (36.6 C), resp. rate 18, height 6\' 3"  (1.905 m), weight 97.6 kg, SpO2  100%.  Medical Problem List and Plan: 1. 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 Pt has hx of prior CVA with residual aphasia.              -patient may shower             -ELOS/Goals: 14-18, PT/OT/SLP min A to sup - 9/4 estimated discharge             -Order PRAFO/WHO -utilize nightly             -Continue CIR  - 8/15 cognitive status mildly reduced from yesterday, is still within normal limits  from admission given documentation but given CT head 8-12 with questionable IPH, recommended serial repeat, will repeat CT head without contrast today  -8-16: CT head without significant changes, doing better cognitively today with encouragement, monitor  - 8/ 20: Cognition and participation waxes and wanes depending on day and time of day.  Overall, participates well but tends to "push"/flexor withdrawal with weightbearing on right leg which limits standing tolerance.   2.  Antithrombotics: -DVT/anticoagulation:  Pharmaceutical: Xarelto 20mg  daily w/breakfast?             -antiplatelet therapy: N/A  3. Pain Management/right sided spasticity:  Tylenol. Local measures.  --Per chart review was on tramadol? Naltrexone? Lidocaine patches?    -8-15: Increase spasticity right lower extremity, add baclofen 10 mg 3 times daily -8-16: Pain in right lower extremity, spasticity versus neuropathic, start gabapentin 100 mg 3 times daily.  Check renal function on Monday 8/19: BL LE pain, aching today; increase tramadol to 50 mg BID; cannot increase baclofen/gabapentin d/t aki 8/20: Using PRN tylenol frequently; schedule tylenol 1000 mg TID, switch tramadol to 50 mg Q6H PRN 8-21: Cognition is improved and pain seems fairly well-controlled, monitor  4. Mood/Behavior/Sleep: LCSW to follow for evaluation and support.              -antipsychotic agents: N/A  5. Neuropsych/cognition: This patient is not capable of making decisions on  his own behalf.  6. Skin/Wound Care: Routine pressure relief measures  7. Fluids/Electrolytes/Nutrition/Dysphagia: Monitor I/O. Check CMET in am             --assist with meals  -8-15: Labs improved, discontinue IV fluid 50 cc/h; check Monday  eating 75-100% meals per report  -therapies to work on encouraging p.o. fluids today  8. HTN: Home meds Amlodipine 5mg , Hydralazine 25mg  daily,  goal SBP < 180, long term goal normotensive -Monitor BP TID continue amlodipine - stable -8-15: Has been mostly normotensive, will reduce amlodipine back to 5 mg as perfusion difficulty may be contributing to cognitive difficulties at this time. -8-16: Remains hypotensive today, will DC amlodipine; no current medications -8/17-18/24 BPs improving, monitor 8/19: Orthostatic; IVF, TEDs, Binder, currently not on PO BP medications 8-22: Much better with improved water intake  Vitals:   10/31/22 1634 10/31/22 2010 11/01/22 0509 11/01/22 1345  BP: 113/78 106/86 114/77 105/73   11/01/22 2010 11/02/22 0534 11/02/22 1436 11/02/22 2002  BP: 107/78 96/78 104/82 111/78   11/03/22 0424 11/03/22 1438 11/03/22 1941 11/04/22 0421  BP: 125/84 (!) 116/93 105/79 (!) 119/94     9. A fib: Monitor HR TID. On xarelto. Heart rate controlled  -10/30/22 rate controlled  10.  Thrombocytopenia: Recheck platelets in am. Monitor for signs of bleeding.-- plt 156 on 10/28/22  11. Dysphagia: On D2,thins. Encourage fluid intake   - see above  12. Acute on chronic renal failure:  SCr 1.32 at admission. Appears baseline 1.3-1.4 --Continue to monitor and encourage intake  - uptrending on last labs 8/12, encourage fluids, BMP in AM -stable, reassess q. Monday - 8/19: AKI Cr 1.6 again; IVF today 75 cc/hr - 8/20: 1.4 today, DC IVF, POs excellent 8-22: Creatinine 1.3, improved.  Stable on p.o. regimen.  13. Hyponatremia: Na dropped to 132 in 48 hours. Recheck in am   - 8-15: Stable 132-135, monitor   8/19: 136; resolved  14.  H/o  CAD s/p PCI: Monitor for symptoms with increase in activity.             --continue  Crestor 40mg  daily  15.  Situational depression: Lexapro 10mg  added on acute.    - Also benefit for motor recovery of UE; would not adjust  16. Hx of multiple cancers: R-RCC s/p nephrectomy 2015.  -Right adrenal pheochromocytoma  s/p right adrenalectomy 2017.  -Prostate CA s/p IMRT 2018.  Continue flomax -RUL nodule being monitored.  17. H/o Emphysema?: Noted on CT chest on care everywhere.    - stable  18. HLD. Continue statin as above  19. Substance abuse. Encourage cessation  20.  Urinary retention/Prostate CA s/p IMRT 2018.  Continue flomax.  -10/30/22 Remains incontinent but low PVRs, encourage up to bedside and monitor-- could claritin be interfering? -10/31/22 needed caths overnight; defer adjustments to weekday team 8/19: Add bethenacol 10 mg TID for retention 8/20: Increase to 25 mg TID 8-21: Increase bethanechol to 25 mg 3 times daily, and obtain renal ultrasound to ensure no obstruction.  If no improvement in 24 hours, will replace Foley. 8-22: Remains completely incontinent, renal ultrasound shows mildly thickened and trabeculated bladder but no gross obstruction.  Replace Foley, give patient urinary rest over the weekend and DC bethanechol due to potential side effects.  Will non-emergently consult urology in a.m. for management recs.  20. Constipation: -10/30/22 reported BM 2 days ago but looks like he had two yesterday but none for 2 days prior. Will schedule senokot 1 tab daily and add miralax daily PRN for now-- monitor, if oversoftened/too frequent with senokot then may just switch back to PRN but pt will have difficult time asking for PRNs -10/31/22 LBM yesterday, however has been smears since 8/17; increase bowel regimen to sennakpot-s 2 tabs BID + miralax BID Last bowel movement 8-22  LOS: 9 days A FACE TO FACE EVALUATION WAS PERFORMED  Angelina Sheriff 11/04/2022, 9:25 AM

## 2022-11-04 NOTE — Progress Notes (Signed)
Speech Language Pathology Daily Session Note  Patient Details  Name: Frank Barron MRN: [id number] Date of Birth: [birth year]  Today's Date: 11/04/2022 SLP Individual Time: 1255-1345 SLP Individual Time Calculation (min): 50 min  Short Term Goals: Week 1: SLP Short Term Goal 1 (Week 1): Patient will utilize compensatory strategies to consume dys2 / thin liquids with no overt s/sx of aspiration with maxA SLP Short Term Goal 2 (Week 1): Patient will utilize multimodal means of communication to express wants/needs with maxA SLP Short Term Goal 3 (Week 1): Patient will participate in automatic speech tasks with 25% accuracy and max multimodal A SLP Short Term Goal 4 (Week 1): Patient will increase word finding of common objects using compensatory strategies with max multimodal A SLP Short Term Goal 5 (Week 1): Patinet will demonstrate sustained attention with max multimodal A for 5 minutes during functional tasks  Skilled Therapeutic Interventions:   Pt greeted in his room. He was awake/alert upon ST arrival and agreeable to tx tasks targeting dysphagia and language. Assisted to ST tx room via wheelchair. Very pleasant mood noted overall today as evidenced by laughter, smiling, and active participation. SLP facilitated thin liquid trials of coffee. He tolerated well overall, only presenting w/ cough x1 across ~8 oz. Moderate R anterior spillage noted throughout trials. Throughout tx session, pt answered Y/N questions via head nods or verbalizations of Y/N. He required only spv cues for confirmation of responses 2* conflicting responses. He benefited from max visual/verbal cues for very basic/functional phrase completion tasks, though totalA required for simple sentence completion tasks 2* apraxic distortions. He was able to unscramble the numbers 1-10 INDly during functional reading comprehension task. Max visual/verbal cues required to verbalize 1-10 after unscrambling. Pt noted to perseverate on "2"  during verbalizations. Pt demonstrated adequate error awareness throughout tx tasks. Majority of the time, he was unable to self correct despite SLP demos, however, responded well to verbal encouragement provided by SLP. SLP also provided additional education re apraxia and aphasia and pt utilized head nod to "verbalize" understanding. Would benefit from continued education. Plan to trial Dys 3 textures and continue w/ thin liquid trials in upcoming tx session 8/23. Pt was assisted back to his room and left w/ the chair alarm set/call light within reach. RNT also present upon SLP departure. Recommend cont ST per POC.   Pain  No pain reported   Therapy/Group: Individual Therapy  Pati Gallo 11/04/2022, 4:32 PM

## 2022-11-04 NOTE — Progress Notes (Signed)
Physical Therapy Session Note  Patient Details  Name: Frank Barron MRN: [id number] Date of Birth: May 19, 1950  Today's Date: 11/04/2022 PT Individual Time: 0920-1004 PT Individual Time Calculation (min): 44 min   Short Term Goals: Week 1:  PT Short Term Goal 1 (Week 1): Pt will complete supine to sit EOB min assist PT Short Term Goal 2 (Week 1): Pt will complete sit<>stand mod assist PT Short Term Goal 3 (Week 1): Pt will complete bed<>WC transfer with mod assist and LRAD PT Short Term Goal 4 (Week 1): Pt will initiate WC mobility training  Skilled Therapeutic Interventions/Progress Updates:    Pt presents in room in bed, agreeable to PT via head nod, unable to report pain but in no visible distress. Session focused on bed mobility, positioning in WC, and NMR for RLE muscle fiber recruitment needed for functional transfers, bed mobility, and ambulation.  Therapist dons TED hose and threads pants total assist in supine. Pt completes rolling with max verbal/visual cues for sequencing rolling, max assist to R, max assist x2 to L for donning pants and placement of maxi move pad. Pt then completes total assist x2 transfer bed to WC. During transfer pt with notable odor suggestive of BM. Pt able to verbalize clearly "yes" when asked if he thought he had had BM. Pt transferred back to bed totalAx2 for periarea hygiene and brief change with rolling max assist x2. Pt then transferred back to [facility] via maxi move and transported to main gym. Pt provided with padding to R leg rest due to positioning of R leg to decrease risk of skin breakdown. Pt completes NMR while seated in WC for RLE x10 AAROM marching RLE and x10 AAROM LAQ. Pt provided with max verbal/tactile cues for desired muscle activation with pt demonstrating trace muscle activity.  Pt returns to room and remains seated in [facility] with all needs within reach, cal light in place and chair alarm donned and activated at end of session.   Therapy  Documentation Precautions:  Precautions Precautions: Fall Precaution Comments: R hemiplegia, aphasia (inconsistent yes/no) Restrictions Weight Bearing Restrictions: No   Therapy/Group: Individual Therapy  Edwin Cap PT, DPT 11/04/2022, 12:32 PM

## 2022-11-04 NOTE — Progress Notes (Signed)
Occupational Therapy Weekly Progress Note  Patient Details  Name: Frank Barron MRN: [id number] Date of Birth: [birth year]  Beginning of progress report period: October 27, 2022 End of progress report period: November 04, 2022  Today's Date: 11/04/2022 Session 1 OT Individual Time: 4098-1191 OT Individual Time Calculation (min): 72 min   Session 2 OT Individual Time: 1415-1445 OT Individual Time Calculation (min): 30 min    Patient has met 0 of 3 short term goals.  Pt is making slow progress towards OT goals. Pt has started pushing hard to the R when standing in [name] and has needed mostly the maximove for transfers right now. Pt continues to need +2 assistance for BADL tasks. His sitting balance, however, has improved to mostly Supervision/CGA, but still needs min A for dynamic tasks in sitting Pt has demonstrated some improved attention to the R side but has strong tone in R LE and mild to moderate tone in R hand. Continue current POC at this time.   Patient continues to demonstrate the following deficits: muscle weakness, decreased cardiorespiratoy endurance, impaired timing and sequencing, abnormal tone, unbalanced muscle activation, motor apraxia, ataxia, decreased coordination, and decreased motor planning, decreased midline orientation, decreased attention to right, right side neglect, decreased motor planning, and ideational apraxia, decreased initiation, decreased attention, decreased awareness, decreased problem solving, decreased safety awareness, decreased memory, and delayed processing, and decreased sitting balance, decreased standing balance, decreased postural control, hemiplegia, and decreased balance strategies and therefore will continue to benefit from skilled OT intervention to enhance overall performance with BADL, iADL, and Reduce care partner burden.  Patient progressing toward long term goals..  Continue plan of care.  OT Short Term Goals Week 1:  OT Short Term Goal 1  (Week 1): Pt will don shirt with minA OT Short Term Goal 1 - Progress (Week 1): Progressing toward goal OT Short Term Goal 2 (Week 1): Pt will complete sit <> stand with LUE support, mod A OT Short Term Goal 2 - Progress (Week 1): Progressing toward goal OT Short Term Goal 3 (Week 1): Pt will complete LB dressing with max A +1 OT Short Term Goal 3 - Progress (Week 1): Progressing toward goal Week 2:  OT Short Term Goal 1 (Week 2): Patient will complete toilet transfer with max A of 1 OT Short Term Goal 2 (Week 2): Patient will don shirt with mod A OT Short Term Goal 3 (Week 2): Pt will locate 2 items on L side of the sink with mod ques  Skilled Therapeutic Interventions/Progress Updates:  Session 1   Pt greeted sitting in [name] and agreeable to OT treatment session. Pt brought to therapy gym and worked on slideboard transfer from [name] wc to therapy mat to weaker R side with Max A +2, however some initiation noted. Addressed sitting balance with close supervision, but CGA/min A with dynamic balance challenge. Addressed functional use of R UE and R visual scanning with cone stacking activity. Pt with trace activation in shoulder and was able to use tone on R hand to help grasp cones. Hand over hand for stacking. Focus on repetition of activity. Worked on attention to R UE and visual scanning. Educated on gentle self ROM with with initially demonstrating some understanding, but minimal carrover. OT placed SAEBO e-stim on wrist extensors and provided pt with foam block. Worked on trying to flex fingers when SAEBO turned off. Blocked practice for slideboard transfers L and R. Pt initially pushing himself when OT tried to assist too  much, but with practice, pt initiated the scoot across slideboard well and was able to slide towards stronger L side with mod A and 1 person steadying the chair.    SAEBO left on for 60 minutes. OT returned to remove SAEBO with skin intact and no adverse reactions.  Saebo Stim  One 330 pulse width 35 Hz pulse rate On 8 sec/ off 8 sec Ramp up/ down 2 sec Symmetrical Biphasic wave form  Max intensity at 500 Ohm load  Pt indicated he wanted to stay up in chair and left seated in [name] with alarm on, call bell in reach, and needs met.   Pain:   Denies pain  Session 2 Pt greeted semi-reclined in [name]. Pt noted to be incontinent of urine and soaked through brief and pants. Worked on slideboard transfer from wc to bed with slidebaord and max A of 1 person! Max A roll to the R but +2 still needed ro roll L with total A for peri-care and brief change. Shirt also changed due to urine on shirt with mod A and good initiation to doff and don shirt. Pt declined further treatment session shaking head no and pt left semi-reclined in bed with bed alarm on,c call bell in reach, and needs met.   Therapy Documentation Precautions:  Precautions Precautions: Fall Precaution Comments: R hemiplegia, aphasia (inconsistent yes/no) Restrictions Weight Bearing Restrictions: No Pain:   Denies pain  Therapy/Group: Individual Therapy  [name] 11/04/2022, 3:16 PM

## 2022-11-04 NOTE — Progress Notes (Signed)
RN assisted by NT inserted 37fr coude foley catheter per order. Peri care performed prior and post insertion. Successful insertion with urine collected.

## 2022-11-05 DIAGNOSIS — I63512 Cerebral infarction due to unspecified occlusion or stenosis of left middle cerebral artery: Secondary | ICD-10-CM | POA: Diagnosis not present

## 2022-11-05 NOTE — Progress Notes (Signed)
PROGRESS NOTE   Subjective/Complaints:  Vitals stable. No events overnight.  Large BM overnight Foley placed yesterday.  Patient denies discomfort.  Spoke with patient's wife and daughter Frank Barron.  Both confirm that he has a past medical history of drug induced urinary retention, do not think that he had an outpatient urologist and unsure how this was managed.  He also has history of prostatectomy for prostate cancer, as well as prior documented kidney removal for renal cell carcinoma.  Note, patient's wife is currently in the hospital with a kidney infection.  ROS: Difficult to ascertain due to aphasia; see HPI above  Objective:   US RENAL  Result Date: 11/03/2022 CLINICAL DATA:  Urinary retention EXAM: RENAL / URINARY TRACT ULTRASOUND COMPLETE COMPARISON:  Noncontrast CT 01/01/2016 FINDINGS: Right Kidney: Surgically absent as per history. No abnormal soft tissue in the surgical bed. Left Kidney: Renal measurements: 11.4 x 5.9 x 5.7 cm = volume: 199.1 mL. Echogenicity within normal limits. No mass or hydronephrosis visualized. Bladder: Distended urinary bladder with slight wall thickening and trabeculation. Left ureteral jet is seen. Other: None. IMPRESSION: Previous right nephrectomy. No left-sided collecting system dilatation. Distended urinary bladder with some wall thickening and trabeculation Electronically Signed   By: Karen Kays M.D.   On: 11/03/2022 13:49   Recent Labs    11/04/22 0553  WBC 4.9  HGB 13.3  HCT 39.8  PLT 238    Recent Labs    11/04/22 0553  NA 135  K 4.6  CL 101  CO2 24  GLUCOSE 94  BUN 36*  CREATININE 1.30*  CALCIUM 9.2     Intake/Output Summary (Last 24 hours) at 11/05/2022 0916 Last data filed at 11/05/2022 0503 Gross per 24 hour  Intake 238 ml  Output 1300 ml  Net -1062 ml        Physical Exam: Vital Signs Blood pressure 129/86, pulse (!) 59, temperature 97.6 F (36.4 C), resp.  rate 17, height 6\' 3"  (1.905 m), weight 98.9 kg, SpO2 100%.  General: No apparent distress.  Sitting in wheelchair, reclined HEENT: mild left exotropia, pupils equal and round, unchanged Heart: reg rate, irregularly irregular rhythm. No murmurs rubs or gallops Chest: CTA bilaterally without wheezes, rales, or rhonchi; no distress Abdomen: Soft, non-tender, non-distended, bowel sounds positive.+ Foley, draining clear urine Extremities: No clubbing, cyanosis, or edema.   Psych: Flat affect, difficult d/t aphasia Skin: Clean and intact without signs of breakdown MsK: no apparent deformities or tenderness Neuro: Orientation assessment limited by aphasia, is oriented to self ; not place or date with options  + expressive greater than receptive aphasia, able to follow simple commands + right hemineglect -waxing and waning able to overcome CN: + right facial droop, decreased shoulder shrug on the right  Strength 5 out of 5 in left upper extremity left lower extremity, with exception of left hip flexion 3-/5 due to pain Strength 0 out of 5 in right upper extremity and right lower extremity, complicated by neglect  sensation to light touch decreased in right upper extremity; not right lower extremity.  Sensation to pain intact.  MAS 1 R elbow flexors, shoulder adductors MAS 1+ R hamstrings and right  plantar flexors -improved       Assessment/Plan: 1. Functional deficits which require 3+ hours per day of interdisciplinary therapy in a comprehensive inpatient rehab setting. Physiatrist is providing close team supervision and 24 hour management of active medical problems listed below. Physiatrist and rehab team continue to assess barriers to discharge/monitor patient progress toward functional and medical goals  Care Tool:  Bathing    Body parts bathed by patient: Chest, Abdomen, Right upper leg, Left upper leg, Face, Right arm   Body parts bathed by helper: Right lower leg, Left lower leg,  Buttocks, Front perineal area, Left arm     Bathing assist Assist Level: Maximal Assistance - Patient 24 - 49%     Upper Body Dressing/Undressing Upper body dressing   What is the patient wearing?: Button up shirt    Upper body assist Assist Level: Moderate Assistance - Patient 50 - 74%    Lower Body Dressing/Undressing Lower body dressing      What is the patient wearing?: Pants     Lower body assist Assist for lower body dressing: 2 Helpers     Toileting Toileting    Toileting assist Assist for toileting: 2 Helpers     Transfers Chair/bed transfer  Transfers assist  Chair/bed transfer activity did not occur: Safety/medical concerns  Chair/bed transfer assist level: Dependent - mechanical lift     Locomotion Ambulation   Ambulation assist   Ambulation activity did not occur: Safety/medical concerns          Walk 10 feet activity   Assist  Walk 10 feet activity did not occur: Safety/medical concerns        Walk 50 feet activity   Assist Walk 50 feet with 2 turns activity did not occur: Safety/medical concerns         Walk 150 feet activity   Assist Walk 150 feet activity did not occur: Safety/medical concerns         Walk 10 feet on uneven surface  activity   Assist Walk 10 feet on uneven surfaces activity did not occur: Safety/medical concerns         Wheelchair     Assist Is the patient using a wheelchair?: Yes Type of Wheelchair: Manual    Wheelchair assist level: Dependent - Patient 0% (pt unable to assist due to strength and tonal deficits)      Wheelchair 50 feet with 2 turns activity    Assist        Assist Level: Dependent - Patient 0%   Wheelchair 150 feet activity     Assist      Assist Level: Dependent - Patient 0%   Blood pressure 129/86, pulse (!) 59, temperature 97.6 F (36.4 C), resp. rate 17, height 6\' 3"  (1.905 m), weight 98.9 kg, SpO2 100%.  Medical Problem List and Plan: 1.  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 Pt has hx of prior CVA with residual aphasia.              -patient may shower             -ELOS/Goals: 14-18, PT/OT/SLP min A to sup - 9/4 estimated discharge             -Order PRAFO/WHO -utilize nightly             -Continue CIR  - 8/15 cognitive status mildly reduced from yesterday, is still within normal limits from  admission given documentation but given CT head 8-12 with questionable IPH, recommended serial repeat, will repeat CT head without contrast today  -8-16: CT head without significant changes, doing better cognitively today with encouragement, monitor  - 8/ 20: Cognition and participation waxes and wanes depending on day and time of day.  Overall, participates well but tends to "push"/flexor withdrawal with weightbearing on right leg which limits standing tolerance.   2.  Antithrombotics: -DVT/anticoagulation:  Pharmaceutical: Xarelto 20mg  daily w/breakfast?             -antiplatelet therapy: N/A  3. Pain Management/right sided spasticity:  Tylenol. Local measures.  --Per chart review was on tramadol? Naltrexone? Lidocaine patches?    -8-15: Increase spasticity right lower extremity, add baclofen 10 mg 3 times daily -8-16: Pain in right lower extremity, spasticity versus neuropathic, start gabapentin 100 mg 3 times daily.  Check renal function on Monday 8/19: BL LE pain, aching today; increase tramadol to 50 mg BID; cannot increase baclofen/gabapentin d/t aki 8/20: Using PRN tylenol frequently; schedule tylenol 1000 mg TID, switch tramadol to 50 mg Q6H PRN 8-21: Cognition is improved and pain seems fairly well-controlled, monitor  4. Mood/Behavior/Sleep: LCSW to follow for evaluation and support.              -antipsychotic agents: N/A  5. Neuropsych/cognition: This patient is not capable of making decisions on his own behalf.  6. Skin/Wound Care:  Routine pressure relief measures  7. Fluids/Electrolytes/Nutrition/Dysphagia: Monitor I/O. Check CMET in am             --assist with meals  -8-15: Labs improved, discontinue IV fluid 50 cc/h; check Monday  eating 75-100% meals per report  -therapies to work on encouraging p.o. fluids   8. HTN: Home meds Amlodipine 5mg , Hydralazine 25mg  daily,  goal SBP < 180, long term goal normotensive -Monitor BP TID continue amlodipine - stable -8-15: Has been mostly normotensive, will reduce amlodipine back to 5 mg as perfusion difficulty may be contributing to cognitive difficulties at this time. -8-16: Remains hypotensive today, will DC amlodipine; no current medications -8/17-18/24 BPs improving, monitor 8/19: Orthostatic; IVF, TEDs, Binder, currently not on PO BP medications 8-22: Much better with improved water intake -stable  Vitals:   11/01/22 1345 11/01/22 2010 11/02/22 0534 11/02/22 1436  BP: 105/73 107/78 96/78 104/82   11/02/22 2002 11/03/22 0424 11/03/22 1438 11/03/22 1941  BP: 111/78 125/84 (!) 116/93 105/79   11/04/22 0421 11/04/22 1352 11/04/22 1918 11/05/22 0456  BP: (!) 119/94 (!) 117/93 (!) 112/91 129/86     9. A fib: Monitor HR TID. On xarelto. Heart rate controlled  - rate controlled, monitor  10.  Thrombocytopenia: Recheck platelets in am. Monitor for signs of bleeding.-- plt 156 on 10/28/22  11. Dysphagia: On D2,thins. Encourage fluid intake   - see above  12. Acute on chronic renal failure:  SCr 1.32 at admission. Appears baseline 1.3-1.4 --Continue to monitor and encourage intake  - uptrending on last labs 8/12, encourage fluids, BMP in AM -stable, reassess q. Monday - 8/19: AKI Cr 1.6 again; IVF today 75 cc/hr - 8/20: 1.4 today, DC IVF, POs excellent 8-22: Creatinine 1.3, improved.  Stable on p.o. regimen.  13. Hyponatremia: Na dropped to 132 in 48 hours. Recheck in am   - 8-15: Stable 132-135, monitor   8/19: 136; resolved  14.  H/o CAD s/p PCI: Monitor for  symptoms with increase in activity.             --  continue Crestor 40mg  daily  15.  Situational depression: Lexapro 10mg  added on acute.    - Also benefit for motor recovery of UE; would not adjust  16. Hx of multiple cancers: R-RCC s/p nephrectomy 2015.  -Right adrenal pheochromocytoma  s/p right adrenalectomy 2017.  -Prostate CA s/p IMRT 2018.  Continue flomax -RUL nodule being monitored.  17. H/o Emphysema?: Noted on CT chest on care everywhere.    - stable  18. HLD. Continue statin as above  19. Substance abuse. Encourage cessation  20.  Urinary retention/Prostate CA s/p IMRT 2018.  Continue flomax.  -10/30/22 Remains incontinent but low PVRs, encourage up to bedside and monitor-- could claritin be interfering? -10/31/22 needed caths overnight; defer adjustments to weekday team 8/19: Add bethenacol 10 mg TID for retention 8/20: Increase to 25 mg TID 8-21: Increase bethanechol to 25 mg 3 times daily, and obtain renal ultrasound to ensure no obstruction.  If no improvement in 24 hours, will replace Foley. 8-22: Remains completely incontinent, renal ultrasound shows mildly thickened and trabeculated bladder but no gross obstruction.  Replace Foley, give patient urinary rest over the weekend and DC bethanechol due to potential side effects. 8/23: non-emergent consult to urology for management recs given history of drug-induced retention and prior prostatectomy; may see over the weekend or Monday depending on availability.  Likely Foley to stay in place till discharge.  20. Constipation: -10/30/22 reported BM 2 days ago but looks like he had two yesterday but none for 2 days prior. Will schedule senokot 1 tab daily and add miralax daily PRN for now-- monitor, if oversoftened/too frequent with senokot then may just switch back to PRN but pt will have difficult time asking for PRNs -10/31/22 LBM yesterday, however has been smears since 8/17; increase bowel regimen to sennakpot-s 2 tabs BID +  miralax BID Last bowel movement 8-22  LOS: 10 days A FACE TO FACE EVALUATION WAS PERFORMED  Angelina Sheriff 11/05/2022, 9:16 AM

## 2022-11-05 NOTE — Progress Notes (Signed)
Physical Therapy Weekly Progress Note  Patient Details  Name: Frank Barron MRN: [id number] Date of Birth: 07/08/1950  Beginning of progress report period: October 27, 2022 End of progress report period: November 05, 2022  Today's Date: 11/05/2022 PT Individual Time: 1117-1200 PT Individual Time Calculation (min): 43 min   Patient has met 2 of 4 short term goals.  Pt is making slow progress towards functional goals. Pt currently demonstrating pushing behavior to R when using sara stedy, pt using maxi move transfer for bed<>wc transfers at this time for safety. Pt sitting balance supervision. Pt is able to maintain standing in standing frame up to 8 minutes. Pt tone in RLE is improving but continues to limit functional mobility. Pt completes WC mobility with max assist 150' with LLE. Continue current POC.  Patient continues to demonstrate the following deficits muscle weakness, decreased cardiorespiratoy endurance, impaired timing and sequencing, abnormal tone, unbalanced muscle activation, motor apraxia, decreased coordination, and decreased motor planning, decreased visual perceptual skills, decreased midline orientation, decreased attention to right, and decreased motor planning, decreased initiation, decreased attention, decreased awareness, decreased problem solving, decreased safety awareness, decreased memory, and delayed processing, and decreased sitting balance, decreased standing balance, decreased postural control, hemiplegia, and decreased balance strategies and therefore will continue to benefit from skilled PT intervention to increase functional independence with mobility.  Patient progressing toward long term goals..  Continue plan of care.  PT Short Term Goals Week 1:  PT Short Term Goal 1 (Week 1): Pt will complete supine to sit EOB min assist PT Short Term Goal 1 - Progress (Week 1): Met PT Short Term Goal 2 (Week 1): Pt will complete sit<>stand mod assist PT Short Term Goal 2 -  Progress (Week 1): Progressing toward goal PT Short Term Goal 3 (Week 1): Pt will complete bed<>WC transfer with mod assist and LRAD PT Short Term Goal 3 - Progress (Week 1): Progressing toward goal (inconsistent) PT Short Term Goal 4 (Week 1): Pt will initiate WC mobility training PT Short Term Goal 4 - Progress (Week 1): Met Week 2:  PT Short Term Goal 1 (Week 2): Pt will complete bed mobility (rolling, supine<>sit) min assist with hospital bed features PT Short Term Goal 2 (Week 2): Pt will complete sit<>stand min assist PT Short Term Goal 3 (Week 2): Pt will maintain standing with min assist x1 min PT Short Term Goal 4 (Week 2): Pt will consistently complete bed<>wc transfers with min assist  Skilled Therapeutic Interventions/Progress Updates:  Ambulation/gait training;DME/adaptive equipment instruction;Stair training;UE/LE Strength taining/ROM;Wheelchair propulsion/positioning;Neuromuscular re-education;Community reintegration;Balance/vestibular training;Discharge planning;Functional electrical stimulation;Pain management;Therapeutic Activities;UE/LE Coordination activities;Cognitive remediation/compensation;Disease management/prevention;Functional mobility training;Patient/family education;Splinting/orthotics;Therapeutic Exercise;Visual/perceptual remediation/compensation   Pt presents in room in [facility], agreeable to PT. Pt unable to report pain but in no visible distress. Session focused on transfer training, NMR for midline orientation in standing, and WC mobility.  Pt transported to day room dependently for time management. Pt completes slideboard transfer with min assist x2 initially due to pt with poor initiation and motor apraxia however able to progress to CGA once started and with verbal cues. Pt then completes x5 sit<>stands with mirror positioned in front with mat elevated, min assist x2 for stand with pt demonstrating decreased pushing behavior without UE support, pt requires min/mod  assist for standing balance due to posterior lean, able to maintain midline with visual cues with mirror. On 3 stands pt completes pulling squigz off mirror to enforce upright posturing and use of LUE to decrease pushing tendency however pt fatigues  quickly, only able to maintain standing ~10 seconds with each stand and requires extended seated rest breaks between. Following final stand pt with increased difficulty responding to questions, completes slideboard transfer with min/mod assist x2 back to [facility] and positioned in tilt for rest break. Pt demonstrates improved responsiveness with increased time in this position, therapist encourages fluids while seated for improved PO. Pt then completes WC mobility with LLE only from dayroom back to room ~150' max assist and verbal cues for initiation and attention to task.  Pt returns to room and remains seated in [facility] with all needs within reach, cal light in place and chair alarm donned and activated at end of session.   Therapy Documentation Precautions:  Precautions Precautions: Fall Precaution Comments: R hemiplegia, aphasia (inconsistent yes/no) Restrictions Weight Bearing Restrictions: No   Therapy/Group: Individual Therapy  Edwin Cap PT, DPT 11/05/2022, 4:27 PM

## 2022-11-05 NOTE — Progress Notes (Signed)
Speech Language Pathology Weekly Progress and Session Note  Patient Details  Name: Dayveon Bulman MRN: [id number] Date of Birth: 01-31-1951  Beginning of progress report period: October 27, 2022 End of progress report period: November 06, 2022  Today's Date: 11/05/2022 SLP Missed Time: 45 Minutes Missed Time Reason: Patient unwilling to participate  Short Term Goals: Week 1: SLP Short Term Goal 1 (Week 1): Patient will utilize compensatory strategies to consume dys2 / thin liquids with no overt s/sx of aspiration with maxA SLP Short Term Goal 1 - Progress (Week 1): Not met SLP Short Term Goal 2 (Week 1): Patient will utilize multimodal means of communication to express wants/needs with maxA SLP Short Term Goal 2 - Progress (Week 1): Not met SLP Short Term Goal 3 (Week 1): Patient will participate in automatic speech tasks with 25% accuracy and max multimodal A SLP Short Term Goal 3 - Progress (Week 1): Not met SLP Short Term Goal 4 (Week 1): Patient will increase word finding of common objects using compensatory strategies with max multimodal A SLP Short Term Goal 4 - Progress (Week 1): Not met SLP Short Term Goal 5 (Week 1): Patinet will demonstrate sustained attention with max multimodal A for 5 minutes during functional tasks SLP Short Term Goal 5 - Progress (Week 1): Met    New Short Term Goals: Week 2: SLP Short Term Goal 1 (Week 2): Patient will utilize consume least restrictive diet texture w/ minA visual/verbal cues to clear oral residue and R buccal pocketing SLP Short Term Goal 2 (Week 2): Pt will tolerate thin liquids via cup w/ minimal overt s/s of aspiration SLP Short Term Goal 3 (Week 2): Patient will utilize multimodal means of communication to express wants/needs with maxA SLP Short Term Goal 4 (Week 2): Pt will complete simple/functional naming tasks w/ maxA multimodal cues SLP Short Term Goal 5 (Week 2): Pt will actively participate in speech therapy tasks targeting  dysphagia, motor speech, language, and/or cognition 75% of the time w/ modA verbal cues  Weekly Progress Updates:  Pt met 1/5 STGs this week. Minimal progress noted 2* extremely limited participation. At this time, he requires maxA-totalA overall for communication of wants/needs 2* nonfluent aphasia and apraxia. He demonstrates emerging success w/ automatic speech production/responses and functional naming tasks, but is unable to consistently meet any of his expression goals at this time given limited participation in ST. He does present w/ improved sustained attention and demonstrates the ability to achieve remaining STGs if participation improves. He remains on Dys 2 diet w/ nectar thick liquids at this time, though ST to upgrade liquid/diet texture to Dys 3/thin liquids in upcoming tx sessions if success continues. Pt education ongoing. He would benefit from skilled ST services to improve overall communication of wants/needs, maximize pt independence, and reduce caregiver burden.    Intensity: Minumum of 1-2 x/day, 30 to 90 minutes Frequency: 3 to 5 out of 7 days Duration/Length of Stay: 9/4 Treatment/Interventions: Dysphagia/aspiration precaution training;Internal/external aids;Speech/Language facilitation;Cueing hierarchy;Therapeutic Activities;Functional tasks;Multimodal communication approach;Patient/family education;Therapeutic Exercise    Pain  No pain reported   Pati Gallo 11/05/2022, 12:50 PM

## 2022-11-05 NOTE — Progress Notes (Addendum)
Speech Language Pathology Daily Session Note  Patient Details  Name: Frank Barron MRN: [id number] Date of Birth: 02/17/1951  Today's Date: 11/05/2022 SLP Individual Time: 1446-1530 SLP Individual Time Calculation (min): 44 min  Short Term Goals: Week 2: SLP Short Term Goal 1 (Week 2): Patient will utilize consume least restrictive diet texture w/ minA visual/verbal cues to clear oral residue and R buccal pocketing SLP Short Term Goal 2 (Week 2): Pt will tolerate thin liquids via cup w/ minimal overt s/s of aspiration SLP Short Term Goal 3 (Week 2): Patient will utilize multimodal means of communication to express wants/needs with maxA SLP Short Term Goal 4 (Week 2): Pt will complete simple/functional naming tasks w/ maxA multimodal cues SLP Short Term Goal 5 (Week 2): Pt will actively participate in speech therapy tasks targeting dysphagia, motor speech, language, and/or cognition 75% of the time w/ modA verbal cues  Skilled Therapeutic Interventions: SLP conducted skilled therapy session targeting dysphagia management and communication goals. Patient agreeable to participate and was actively engaged throughout session. During session, SLP assessed patient with upgraded Dys3/thin liquid textures noting no overt s/sx of penetration/aspiration across trials.  Right anterior labial spillage observed x1 with large, consecutive sips of thin liquids from cup. Recommend diet upgade to Dys3/thin liquid. Continue with meds crushed in puree, full supervision, and no straws. Check for oral residue. SLP facilitated communication tasks with patient requiring maxA to utilize multimodal forms to communicate answers to basic biographical questions. During simple, functional naming tasks, patient unable to name environmental objects 2* severe apraxic errors despite total multimodal cues, attempts at unison speech, and tactile articulatory placement. During yes/no questions, patient required max-totalA to correctly  answer basic biographical questions. During speech sound trials, patient approximated /a/ given models, total multimodal cues, and unison speech in 4/5 trials, /o/ in 3/5 trials, and /u/ in 1/5 trials. Patient was left in lowered bed with call bell in reach and bed alarm set. SLP will continue to target goals per plan of care.      Pain Pain Assessment Pain Scale: 0-10 Pain Score: 0-No pain  Therapy/Group: Individual Therapy  Jeannie Done, M.A., CCC-SLP  [name] 11/05/2022, 2:57 PM

## 2022-11-05 NOTE — Progress Notes (Signed)
SLP Cancellation Note  Patient Details Name: Frank Barron MRN: [id number] DOB: 05-11-1950   Cancelled treatment: Attempted to see pt for scheduled tx session @ 1030, however, pt refused via lack of acknowledgement of SLP, turning away from SLP, and refusal to respond to Y/N questions. Pt scheduled for additional tx sessoin this afternoon pending pt participation.                                                                                          Pati Gallo 11/05/2022, 4:48 PM

## 2022-11-05 NOTE — Progress Notes (Signed)
Occupational Therapy Session Note  Patient Details  Name: Frank Barron MRN: [id number] Date of Birth: 03/28/1950  Today's Date: 11/05/2022 OT Individual Time: 0730-0827 OT Individual Time Calculation (min): 57 min    Short Term Goals: Week 2:  OT Short Term Goal 1 (Week 2): Patient will complete toilet transfer with max A of 1 OT Short Term Goal 2 (Week 2): Patient will don shirt with mod A OT Short Term Goal 3 (Week 2): Pt will locate 2 items on L side of the sink with mod ques  Skilled Therapeutic Interventions/Progress Updates:    Pt greeted semi-reclined in bed with nurse techs bringing in breakfast. Pt noted to ve incontinent of stool so pt changed at bed level with max A +2 for rolling, peri-care and brief change. Pt also with Foley catheter in place. LB dressing completed at bed level with max A +2. Pt initiated bed mobility but still needed max A to get to upright position at EOB. Max+2 slideboard transfer from bed [name]. Pt set-up for breakfast where he self-fed his entire meal with cues for appropriate bite sizes, cues for pocketing on R and oral residue after bites. Pt brought to the sink for oral care and UB bathing/dressing. Pt needed moderate cues to trun on the water and set-up A with toothbrush. He then brushed teeth with suction used to ensure he did not swallow thin water. Mod A with improved initiation overall for UB bathing. Cues for threading R UE first with pt demonstrating  poor carryover requiring OT to start shirt this way. Pt then left seated in wc at end of session with alarm belt on, call bell in reach, and needs met.   Therapy Documentation Precautions:  Precautions Precautions: Fall Precaution Comments: R hemiplegia, aphasia (inconsistent yes/no) Restrictions Weight Bearing Restrictions: No Pain: Denies pain   Therapy/Group: Individual Therapy  [name] 11/05/2022, 7:56 AM

## 2022-11-06 DIAGNOSIS — K5901 Slow transit constipation: Secondary | ICD-10-CM | POA: Diagnosis not present

## 2022-11-06 DIAGNOSIS — I63512 Cerebral infarction due to unspecified occlusion or stenosis of left middle cerebral artery: Secondary | ICD-10-CM | POA: Diagnosis not present

## 2022-11-06 DIAGNOSIS — I10 Essential (primary) hypertension: Secondary | ICD-10-CM | POA: Diagnosis not present

## 2022-11-06 NOTE — Progress Notes (Signed)
PROGRESS NOTE   Subjective/Complaints:  Pt doing ok today, acknowledges that he slept ok, denies pain, and nods his head yes when asked if he feels ok. Unable to really participate any further in history. No family at bedside.   ROS: Difficult to ascertain due to aphasia; denies pain. See HPI above  Objective:   No results found. Recent Labs    11/04/22 0553  WBC 4.9  HGB 13.3  HCT 39.8  PLT 238    Recent Labs    11/04/22 0553  NA 135  K 4.6  CL 101  CO2 24  GLUCOSE 94  BUN 36*  CREATININE 1.30*  CALCIUM 9.2     Intake/Output Summary (Last 24 hours) at 11/06/2022 1131 Last data filed at 11/06/2022 0900 Gross per 24 hour  Intake 840 ml  Output 1550 ml  Net -710 ml        Physical Exam: Vital Signs Blood pressure (!) 123/94, pulse 73, temperature 98.4 F (36.9 C), temperature source Oral, resp. rate 15, height 6\' 3"  (1.905 m), weight 98.9 kg, SpO2 100%.  General: No apparent distress.  Sitting up in bed.  HEENT: mild left exotropia, pupils equal and round, unchanged Heart: reg rate, irregularly irregular rhythm. No murmurs rubs or gallops Chest: CTA bilaterally without wheezes, rales, or rhonchi; no distress Abdomen: Soft, non-tender, non-distended, bowel sounds positive.+ Foley, draining clear urine Extremities: No clubbing, cyanosis, or edema.   Psych: Flat affect, difficult d/t aphasia Skin: Clean and intact without signs of breakdown over exposed surfaces MsK: no apparent deformities or tenderness  PRIOR EXAMS: Neuro: Orientation assessment limited by aphasia, is oriented to self ; not place or date with options  + expressive greater than receptive aphasia, able to follow simple commands + right hemineglect -waxing and waning able to overcome CN: + right facial droop, decreased shoulder shrug on the right  Strength 5 out of 5 in left upper extremity left lower extremity, with exception of left  hip flexion 3-/5 due to pain Strength 0 out of 5 in right upper extremity and right lower extremity, complicated by neglect  sensation to light touch decreased in right upper extremity; not right lower extremity.  Sensation to pain intact.  MAS 1 R elbow flexors, shoulder adductors MAS 1+ R hamstrings and right plantar flexors -improved       Assessment/Plan: 1. Functional deficits which require 3+ hours per day of interdisciplinary therapy in a comprehensive inpatient rehab setting. Physiatrist is providing close team supervision and 24 hour management of active medical problems listed below. Physiatrist and rehab team continue to assess barriers to discharge/monitor patient progress toward functional and medical goals  Care Tool:  Bathing    Body parts bathed by patient: Chest, Abdomen, Right upper leg, Left upper leg, Face, Right arm   Body parts bathed by helper: Right lower leg, Left lower leg, Buttocks, Front perineal area, Left arm     Bathing assist Assist Level: Maximal Assistance - Patient 24 - 49%     Upper Body Dressing/Undressing Upper body dressing   What is the patient wearing?: Button up shirt    Upper body assist Assist Level: Moderate Assistance - Patient  50 - 74%    Lower Body Dressing/Undressing Lower body dressing      What is the patient wearing?: Pants     Lower body assist Assist for lower body dressing: 2 Helpers     Toileting Toileting    Toileting assist Assist for toileting: 2 Helpers     Transfers Chair/bed transfer  Transfers assist  Chair/bed transfer activity did not occur: Safety/medical concerns  Chair/bed transfer assist level: Dependent - mechanical lift     Locomotion Ambulation   Ambulation assist   Ambulation activity did not occur: Safety/medical concerns          Walk 10 feet activity   Assist  Walk 10 feet activity did not occur: Safety/medical concerns        Walk 50 feet activity   Assist  Walk 50 feet with 2 turns activity did not occur: Safety/medical concerns         Walk 150 feet activity   Assist Walk 150 feet activity did not occur: Safety/medical concerns         Walk 10 feet on uneven surface  activity   Assist Walk 10 feet on uneven surfaces activity did not occur: Safety/medical concerns         Wheelchair     Assist Is the patient using a wheelchair?: Yes Type of Wheelchair: Manual    Wheelchair assist level: Maximal Assistance - Patient 25 - 49% Max wheelchair distance: 150    Wheelchair 50 feet with 2 turns activity    Assist        Assist Level: Maximal Assistance - Patient 25 - 49%   Wheelchair 150 feet activity     Assist      Assist Level: Maximal Assistance - Patient 25 - 49%   Blood pressure (!) 123/94, pulse 73, temperature 98.4 F (36.9 C), temperature source Oral, resp. rate 15, height 6\' 3"  (1.905 m), weight 98.9 kg, SpO2 100%.  Medical Problem List and Plan: 1. 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 Pt has hx of prior CVA with residual aphasia.              -patient may shower             -ELOS/Goals: 14-18, PT/OT/SLP min A to sup - 9/4 estimated discharge             -Order PRAFO/WHO -utilize nightly             -Continue CIR  - 8/15 cognitive status mildly reduced from yesterday, is still within normal limits from admission given documentation but given CT head 8-12 with questionable IPH, recommended serial repeat, will repeat CT head without contrast today  -8-16: CT head without significant changes, doing better cognitively today with encouragement, monitor  - 8/ 20: Cognition and participation waxes and wanes depending on day and time of day.  Overall, participates well but tends to "push"/flexor withdrawal with weightbearing on right leg which limits standing tolerance.   2.  Antithrombotics: -DVT/anticoagulation:   Pharmaceutical: Xarelto 20mg  daily w/breakfast?             -antiplatelet therapy: N/A  3. Pain Management/right sided spasticity:  Tylenol. Local measures.  --Per chart review was on tramadol? Naltrexone? Lidocaine patches?    -8-15: Increase spasticity right lower extremity, add baclofen 10 mg 3 times daily -8-16: Pain in right lower extremity, spasticity versus neuropathic, start gabapentin 100 mg 3  times daily.  Check renal function on Monday 8/19: BL LE pain, aching today; increase tramadol to 50 mg BID; cannot increase baclofen/gabapentin d/t aki 8/20: Using PRN tylenol frequently; schedule tylenol 1000 mg TID, switch tramadol to 50 mg Q6H PRN 8-21: Cognition is improved and pain seems fairly well-controlled, monitor  4. Mood/Behavior/Sleep: LCSW to follow for evaluation and support.              -antipsychotic agents: N/A  -Trazodone PRN  5. Neuropsych/cognition: This patient is not capable of making decisions on his own behalf.  6. Skin/Wound Care: Routine pressure relief measures  7. Fluids/Electrolytes/Nutrition/Dysphagia: Monitor I/O. Check CMET in am             --assist with meals  -8-15: Labs improved, discontinue IV fluid 50 cc/h; check Monday eating 75-100% meals per report  -therapies to work on encouraging p.o. fluids   8. HTN: Home meds Amlodipine 5mg , Hydralazine 25mg  daily,  goal SBP < 180, long term goal normotensive -Monitor BP TID continue amlodipine - stable -8-15: Has been mostly normotensive, will reduce amlodipine back to 5 mg as perfusion difficulty may be contributing to cognitive difficulties at this time. -8-16: Remains hypotensive today, will DC amlodipine; no current medications -8/17-18/24 BPs improving, monitor 8/19: Orthostatic; IVF, TEDs, Binder, currently not on PO BP medications 8-22: Much better with improved water intake -stable 11/06/22 stable BPs, monitor  Vitals:   11/02/22 1436 11/02/22 2002 11/03/22 0424 11/03/22 1438  BP: 104/82 111/78  125/84 (!) 116/93   11/03/22 1941 11/04/22 0421 11/04/22 1352 11/04/22 1918  BP: 105/79 (!) 119/94 (!) 117/93 (!) 112/91   11/05/22 0456 11/05/22 1354 11/05/22 2001 11/06/22 0520  BP: 129/86 (!) 116/94 (!) 128/97 (!) 123/94     9. A fib: Monitor HR TID. On xarelto. Heart rate controlled  - rate controlled, monitor  10.  Thrombocytopenia: Recheck platelets in am. Monitor for signs of bleeding.-- plt 156 on 10/28/22  11. Dysphagia: On D2,thins. Encourage fluid intake   - see above  12. Acute on chronic renal failure:  SCr 1.32 at admission. Appears baseline 1.3-1.4 --Continue to monitor and encourage intake  - uptrending on last labs 8/12, encourage fluids, BMP in AM -stable, reassess q. Monday - 8/19: AKI Cr 1.6 again; IVF today 75 cc/hr - 8/20: 1.4 today, DC IVF, POs excellent -8-22: Creatinine 1.3, improved.  Stable on p.o. regimen.  13. Hyponatremia: Na dropped to 132 in 48 hours. Recheck in am   - 8-15: Stable 132-135, monitor   -8/19: 136; resolved  14.  H/o CAD s/p PCI: Monitor for symptoms with increase in activity.             --continue Crestor 40mg  daily  15.  Situational depression: Lexapro 10mg  added on acute.    - Also benefit for motor recovery of UE; would not adjust  16. Hx of multiple cancers: R-RCC s/p nephrectomy 2015.  -Right adrenal pheochromocytoma  s/p right adrenalectomy 2017.  -Prostate CA s/p IMRT 2018.  Continue flomax -RUL nodule being monitored.  17. H/o Emphysema?: Noted on CT chest on care everywhere.    - stable  18. HLD. Continue statin as above  19. Substance abuse. Encourage cessation  20.  Urinary retention/Prostate CA s/p IMRT 2018.  Continue flomax.   -10/30/22 Remains incontinent but low PVRs, encourage up to bedside and monitor-- could claritin be interfering? -10/31/22 needed caths overnight; defer adjustments to weekday team 8/19: Add bethenacol 10 mg TID for retention 8/20:  Increase to 25 mg TID 8-21: Increase bethanechol to 25  mg 3 times daily, and obtain renal ultrasound to ensure no obstruction.  If no improvement in 24 hours, will replace Foley. 8-22: Remains completely incontinent, renal ultrasound shows mildly thickened and trabeculated bladder but no gross obstruction.  Replace Foley, give patient urinary rest over the weekend and DC bethanechol due to potential side effects. 8/23: non-emergent consult to urology for management recs given history of drug-induced retention and prior prostatectomy; may see over the weekend or Monday depending on availability.  Likely Foley to stay in place till discharge.  20. Constipation: -10/30/22 reported BM 2 days ago but looks like he had two yesterday but none for 2 days prior. Will schedule senokot 1 tab daily and add miralax daily PRN for now-- monitor, if oversoftened/too frequent with senokot then may just switch back to PRN but pt will have difficult time asking for PRNs -10/31/22 LBM yesterday, however has been smears since 8/17; increase bowel regimen to sennakpot-s 2 tabs BID + miralax BID Last bowel movement 8-22 -11/06/22 LBM yesterday 8/23, monitor with current regimen  LOS: 11 days A FACE TO FACE EVALUATION WAS PERFORMED  [address] 11/06/2022, 11:31 AM

## 2022-11-06 NOTE — Progress Notes (Signed)
Speech Language Pathology Daily Session Note  Patient Details  Name: Frank Barron MRN: [id number] Date of Birth: 1950-12-08  Today's Date: 11/06/2022 SLP Individual Time: 0900-1000 SLP Individual Time Calculation (min): 60 min  Short Term Goals: Week 2: SLP Short Term Goal 1 (Week 2): Patient will utilize consume least restrictive diet texture w/ minA visual/verbal cues to clear oral residue and R buccal pocketing SLP Short Term Goal 2 (Week 2): Pt will tolerate thin liquids via cup w/ minimal overt s/s of aspiration SLP Short Term Goal 3 (Week 2): Patient will utilize multimodal means of communication to express wants/needs with maxA SLP Short Term Goal 4 (Week 2): Pt will complete simple/functional naming tasks w/ maxA multimodal cues SLP Short Term Goal 5 (Week 2): Pt will actively participate in speech therapy tasks targeting dysphagia, motor speech, language, and/or cognition 75% of the time w/ modA verbal cues  Skilled Therapeutic Interventions:    Patient was seen for skilled ST services targeting aphasia and apraxia.  He was fully engaged in today's session with no encouragement needed to participate.  SLP assisted patient in calling his daughter, [name].  Portion explained that he was having some difficulty with word finding from previous stroke, but had mostly " recovered".  SLP reviewed " yes/no" as well as communication board.  SLP added darker lines to most used icons on communication board re: yes, no, glasses, dentures, bed.  Patient was in agreement and was able to find all icons on the left, but needed cueing to visually scan to right to find " dentures".   Yes/no questions (repetition and y/n cues used) -Biographical: 100% -Simple: 80% -Complex: 50%  1-step directions given initial model: 70% with repetition of direction x2  Patient produced the following sounds and words with max multimodal cueing: /m/ x 2 /b/ x5 /p/ x 3 Hi, bye, open   Patient benefited from  mirror for visual feedback.   Patient was left in room with all immediate needs within reach and all safety measures activated.  Continue with current ST POC.  Pain Pain Assessment Pain Scale: 0-10 Pain Score: 0-No pain  Therapy/Group: Individual Therapy  Dorena Bodo 11/06/2022, 12:30 PM

## 2022-11-06 NOTE — Progress Notes (Signed)
Refused resting hand splint this evening. Arm repositioned with pillow.

## 2022-11-07 DIAGNOSIS — I10 Essential (primary) hypertension: Secondary | ICD-10-CM | POA: Diagnosis not present

## 2022-11-07 DIAGNOSIS — I63512 Cerebral infarction due to unspecified occlusion or stenosis of left middle cerebral artery: Secondary | ICD-10-CM | POA: Diagnosis not present

## 2022-11-07 DIAGNOSIS — K5901 Slow transit constipation: Secondary | ICD-10-CM | POA: Diagnosis not present

## 2022-11-07 NOTE — Progress Notes (Signed)
PROGRESS NOTE   Subjective/Complaints:  Pt doing "alright" today, acknowledges that he slept well, denies pain, and nods his head yes when asked if he feels ok. Unable to really participate any further in history. No family at bedside. LBM per documentation was this morning. Still with foley.  ROS: Difficult to ascertain due to aphasia; denies pain. See HPI above  Objective:   No results found. No results for input(s): "WBC", "HGB", "HCT", "PLT" in the last 72 hours.   No results for input(s): "NA", "K", "CL", "CO2", "GLUCOSE", "BUN", "CREATININE", "CALCIUM" in the last 72 hours.    Intake/Output Summary (Last 24 hours) at 11/07/2022 1033 Last data filed at 11/07/2022 0801 Gross per 24 hour  Intake 716 ml  Output 1350 ml  Net -634 ml        Physical Exam: Vital Signs Blood pressure 108/86, pulse 60, temperature (!) 97.4 F (36.3 C), resp. rate 18, height 6\' 3"  (1.905 m), weight 98.9 kg, SpO2 97%.  General: No apparent distress.  Sitting up in bed.  HEENT: mild left exotropia, pupils equal and round, unchanged Heart: reg rate, irregularly irregular rhythm. No murmurs rubs or gallops Chest: CTA bilaterally without wheezes, rales, or rhonchi; no distress Abdomen: Soft, non-tender, non-distended, bowel sounds positive.+ Foley, draining clear urine--not seen today Extremities: No clubbing, cyanosis, or edema.   Psych: Flat affect, difficult d/t aphasia Skin: Clean and intact without signs of breakdown over exposed surfaces MsK: no apparent deformities or tenderness  PRIOR EXAMS: Neuro: Orientation assessment limited by aphasia, is oriented to self ; not place or date with options  + expressive greater than receptive aphasia, able to follow simple commands + right hemineglect -waxing and waning able to overcome CN: + right facial droop, decreased shoulder shrug on the right  Strength 5 out of 5 in left upper extremity  left lower extremity, with exception of left hip flexion 3-/5 due to pain Strength 0 out of 5 in right upper extremity and right lower extremity, complicated by neglect  sensation to light touch decreased in right upper extremity; not right lower extremity.  Sensation to pain intact.  MAS 1 R elbow flexors, shoulder adductors MAS 1+ R hamstrings and right plantar flexors -improved       Assessment/Plan: 1. Functional deficits which require 3+ hours per day of interdisciplinary therapy in a comprehensive inpatient rehab setting. Physiatrist is providing close team supervision and 24 hour management of active medical problems listed below. Physiatrist and rehab team continue to assess barriers to discharge/monitor patient progress toward functional and medical goals  Care Tool:  Bathing    Body parts bathed by patient: Chest, Abdomen, Right upper leg, Left upper leg, Face, Right arm   Body parts bathed by helper: Right lower leg, Left lower leg, Buttocks, Front perineal area, Left arm     Bathing assist Assist Level: Maximal Assistance - Patient 24 - 49%     Upper Body Dressing/Undressing Upper body dressing   What is the patient wearing?: Button up shirt    Upper body assist Assist Level: Moderate Assistance - Patient 50 - 74%    Lower Body Dressing/Undressing Lower body dressing  What is the patient wearing?: Pants     Lower body assist Assist for lower body dressing: 2 Helpers     Toileting Toileting    Toileting assist Assist for toileting: 2 Helpers     Transfers Chair/bed transfer  Transfers assist  Chair/bed transfer activity did not occur: Safety/medical concerns  Chair/bed transfer assist level: Dependent - mechanical lift     Locomotion Ambulation   Ambulation assist   Ambulation activity did not occur: Safety/medical concerns          Walk 10 feet activity   Assist  Walk 10 feet activity did not occur: Safety/medical concerns         Walk 50 feet activity   Assist Walk 50 feet with 2 turns activity did not occur: Safety/medical concerns         Walk 150 feet activity   Assist Walk 150 feet activity did not occur: Safety/medical concerns         Walk 10 feet on uneven surface  activity   Assist Walk 10 feet on uneven surfaces activity did not occur: Safety/medical concerns         Wheelchair     Assist Is the patient using a wheelchair?: Yes Type of Wheelchair: Manual    Wheelchair assist level: Maximal Assistance - Patient 25 - 49% Max wheelchair distance: 150    Wheelchair 50 feet with 2 turns activity    Assist        Assist Level: Maximal Assistance - Patient 25 - 49%   Wheelchair 150 feet activity     Assist      Assist Level: Maximal Assistance - Patient 25 - 49%   Blood pressure 108/86, pulse 60, temperature (!) 97.4 F (36.3 C), resp. rate 18, height 6\' 3"  (1.905 m), weight 98.9 kg, SpO2 97%.  Medical Problem List and Plan: 1. 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 Pt has hx of prior CVA with residual aphasia.              -patient may shower             -ELOS/Goals: 14-18, PT/OT/SLP min A to sup - 9/4 estimated discharge             -Order PRAFO/WHO -utilize nightly             -Continue CIR  - 8/15 cognitive status mildly reduced from yesterday, is still within normal limits from admission given documentation but given CT head 8-12 with questionable IPH, recommended serial repeat, will repeat CT head without contrast today  -8-16: CT head without significant changes, doing better cognitively today with encouragement, monitor  - 8/ 20: Cognition and participation waxes and wanes depending on day and time of day.  Overall, participates well but tends to "push"/flexor withdrawal with weightbearing on right leg which limits standing tolerance.   2.   Antithrombotics: -DVT/anticoagulation:  Pharmaceutical: Xarelto 20mg  daily w/breakfast?             -antiplatelet therapy: N/A  3. Pain Management/right sided spasticity:  Tylenol. Local measures.  --Per chart review was on tramadol? Naltrexone? Lidocaine patches?    -8-15: Increase spasticity right lower extremity, add baclofen 10 mg 3 times daily -8-16: Pain in right lower extremity, spasticity versus neuropathic, start gabapentin 100 mg 3 times daily.  Check renal function on Monday 8/19: BL LE pain, aching today; increase tramadol to 50 mg BID;  cannot increase baclofen/gabapentin d/t aki 8/20: Using PRN tylenol frequently; schedule tylenol 1000 mg TID, switch tramadol to 50 mg Q6H PRN 8-21: Cognition is improved and pain seems fairly well-controlled, monitor  4. Mood/Behavior/Sleep: LCSW to follow for evaluation and support.              -antipsychotic agents: N/A  -Trazodone PRN  5. Neuropsych/cognition: This patient is not capable of making decisions on his own behalf.  6. Skin/Wound Care: Routine pressure relief measures  7. Fluids/Electrolytes/Nutrition/Dysphagia: Monitor I/O. Check CMET in am             --assist with meals  -8-15: Labs improved, discontinue IV fluid 50 cc/h; check Monday eating 75-100% meals per report  -therapies to work on encouraging p.o. fluids   8. HTN: Home meds Amlodipine 5mg , Hydralazine 25mg  daily,  goal SBP < 180, long term goal normotensive -Monitor BP TID continue amlodipine - stable -8-15: Has been mostly normotensive, will reduce amlodipine back to 5 mg as perfusion difficulty may be contributing to cognitive difficulties at this time. -8-16: Remains hypotensive today, will DC amlodipine; no current medications -8/17-18/24 BPs improving, monitor 8/19: Orthostatic; IVF, TEDs, Binder, currently not on PO BP medications 8-22: Much better with improved water intake -stable -8/24-25/24 stable BPs, monitor  Vitals:   11/03/22 1941 11/04/22 0421  11/04/22 1352 11/04/22 1918  BP: 105/79 (!) 119/94 (!) 117/93 (!) 112/91   11/05/22 0456 11/05/22 1354 11/05/22 2001 11/06/22 0520  BP: 129/86 (!) 116/94 (!) 128/97 (!) 123/94   11/06/22 1340 11/06/22 1608 11/06/22 1926 11/07/22 0512  BP: 108/81 (!) 119/91 110/85 108/86     9. A fib: Monitor HR TID. On xarelto. Heart rate controlled  - rate controlled, monitor  10.  Thrombocytopenia: Recheck platelets in am. Monitor for signs of bleeding.-- plt 156 on 10/28/22  11. Dysphagia: On D2,thins. Encourage fluid intake   - see above  12. Acute on chronic renal failure:  SCr 1.32 at admission. Appears baseline 1.3-1.4 --Continue to monitor and encourage intake  - uptrending on last labs 8/12, encourage fluids, BMP in AM -stable, reassess q. Monday - 8/19: AKI Cr 1.6 again; IVF today 75 cc/hr - 8/20: 1.4 today, DC IVF, POs excellent -8-22: Creatinine 1.3, improved.  Stable on p.o. regimen.  13. Hyponatremia: Na dropped to 132 in 48 hours. Recheck in am   - 8-15: Stable 132-135, monitor   -8/19: 136; resolved  14.  H/o CAD s/p PCI: Monitor for symptoms with increase in activity.             --continue Crestor 40mg  daily  15.  Situational depression: Lexapro 10mg  added on acute.    - Also benefit for motor recovery of UE; would not adjust  16. Hx of multiple cancers: R-RCC s/p nephrectomy 2015.  -Right adrenal pheochromocytoma  s/p right adrenalectomy 2017.  -Prostate CA s/p IMRT 2018.  Continue flomax -RUL nodule being monitored.  17. H/o Emphysema?: Noted on CT chest on care everywhere.    - stable  18. HLD. Continue statin as above  19. Substance abuse. Encourage cessation  20.  Urinary retention/Prostate CA s/p IMRT 2018.  Continue flomax.   -10/30/22 Remains incontinent but low PVRs, encourage up to bedside and monitor-- could claritin be interfering? -10/31/22 needed caths overnight; defer adjustments to weekday team 8/19: Add bethenacol 10 mg TID for retention 8/20: Increase  to 25 mg TID 8-21: Increase bethanechol to 25 mg 3 times daily, and obtain renal ultrasound to  ensure no obstruction.  If no improvement in 24 hours, will replace Foley. 8-22: Remains completely incontinent, renal ultrasound shows mildly thickened and trabeculated bladder but no gross obstruction.  Replace Foley, give patient urinary rest over the weekend and DC bethanechol due to potential side effects. 8/23: non-emergent consult to urology for management recs given history of drug-induced retention and prior prostatectomy; may see over the weekend or Monday depending on availability.  Likely Foley to stay in place till discharge.  20. Constipation: -10/30/22 reported BM 2 days ago but looks like he had two yesterday but none for 2 days prior. Will schedule senokot 1 tab daily and add miralax daily PRN for now-- monitor, if oversoftened/too frequent with senokot then may just switch back to PRN but pt will have difficult time asking for PRNs -10/31/22 LBM yesterday, however has been smears since 8/17; increase bowel regimen to sennakpot-s 2 tabs BID + miralax BID Last bowel movement 8-22 -11/07/22 LBM today, monitor with current regimen  LOS: 12 days A FACE TO FACE EVALUATION WAS PERFORMED  [address] 11/07/2022, 10:33 AM

## 2022-11-08 DIAGNOSIS — I63512 Cerebral infarction due to unspecified occlusion or stenosis of left middle cerebral artery: Secondary | ICD-10-CM | POA: Diagnosis not present

## 2022-11-08 LAB — BASIC METABOLIC PANEL
Anion gap: 7 (ref 5–15)
BUN: 25 mg/dL — ABNORMAL HIGH (ref 8–23)
CO2: 23 mmol/L (ref 22–32)
Calcium: 9 mg/dL (ref 8.9–10.3)
Chloride: 105 mmol/L (ref 98–111)
Creatinine, Ser: 1.57 mg/dL — ABNORMAL HIGH (ref 0.61–1.24)
GFR, Estimated: 47 mL/min — ABNORMAL LOW (ref >60)
Glucose, Bld: 88 mg/dL (ref 70–99)
Potassium: 4.6 mmol/L (ref 3.5–5.1)
Sodium: 135 mmol/L (ref 135–145)

## 2022-11-08 LAB — CBC
HCT: 37.9 % — ABNORMAL LOW (ref 39.0–52.0)
Hemoglobin: 12.5 g/dL — ABNORMAL LOW (ref 13.0–17.0)
MCH: 30.1 pg (ref 26.0–34.0)
MCHC: 33 g/dL (ref 30.0–36.0)
MCV: 91.3 fL (ref 80.0–100.0)
Platelets: 213 10*3/uL (ref 150–400)
RBC: 4.15 MIL/uL — ABNORMAL LOW (ref 4.22–5.81)
RDW: 13.8 % (ref 11.5–15.5)
WBC: 3.9 10*3/uL — ABNORMAL LOW (ref 4.0–10.5)
nRBC: 0 % (ref 0.0–0.2)

## 2022-11-08 MED ORDER — SODIUM CHLORIDE 0.9 % IV SOLN: INTRAVENOUS | Status: AC

## 2022-11-08 NOTE — Consult Note (Signed)
Urology Consult Note   Requesting Attending Physician:  Angelina Sheriff, DO Service Providing Consult: Urology  Consulting Attending: Dr. Annabell Howells   Reason for Consult:  Urinary retention  HPI: Frank Barron is seen in consultation for reasons noted above at the request of Angelina Sheriff, DO.  Patient has received the majority of his medical care at the [place].  Per his notes he was diagnosed with prostate cancer, s/p XRT in 2018- renal cell carcinoma status post left nephrectomy, and pheochromocytoma- status post left adrenalectomy in 12/17.  PMH significant for HTN, cocaine abuse, EtOH abuse, CAD, A-fib, stroke, DVT of lower extremity, hep C, back pain, anxiety/depression.   He initially presented to the emergency department on 10/22/2022 for acute onset right-sided weakness.  He reported to have not been compliant with Xarelto.  CT scan eventually revealed acute left M1 occlusion. on assessment patient was seemingly alert and oriented, though he only answered yes or no questions.  He was in an incontinence brief on arrival and nursing reports him incontinent of stool.  Foley catheter in place draining clear yellow urine. ------------------  Assessment:  72 y.o. male with indwelling Foley catheter 2/2 urinary retention.   Recommendations: # Urinary retention Patient is reported to have a history of drug-induced urinary retention.  He does not appear to be on any medications at this time that would be responsible.  He is also on twice daily Flomax. I do strongly discourage usage of trazodone in men.  It is our #1 cause of priapism.  This may occur in individuals who have tolerated well for long periods of time. Considering he is also incontinent of stool following his recent stroke, this is presumably neurologic in nature.  Recommend he follow-up for urodynamic study to fully characterize dysfunction, with possible voiding trial after discharge. If discharge is delayed, please exchange foley  every 30d.  Please call with questions or acute changes.    Case and plan discussed with Dr. Annabell Howells  Past Medical History: Past Medical History:  Diagnosis Date   Anemia    Anxiety    Arthritis    Atrial fibrillation (HCC)    CAD (coronary artery disease)    PCI to LAD 2012   CHF (congestive heart failure) (HCC)    Chronic back pain    Depression    [name] 07/19/2005 (09/30/2012)   H/O ETOH abuse    Hepatitis C    pt denies this hx on 09/30/2012   High cholesterol    History of DVT of lower extremity    "left; long time ago" (09/30/2012)   Hypertension    OA (osteoarthritis) of hip    moderate to severe Right hip   Prostate cancer (HCC)    Renal cell carcinoma (HCC)    Stroke (HCC) 11/18/2011   [name] 07/31/2012; expressive aphasia noted on 09/30/2012    Past Surgical History:  Past Surgical History:  Procedure Laterality Date   COLONOSCOPY N/A 10/01/2012   Procedure: COLONOSCOPY;  Surgeon: Louis Meckel, MD;  Location: [facility] ENDOSCOPY;  Service: Endoscopy;  Laterality: N/A;   CORONARY ANGIOPLASTY WITH STENT PLACEMENT     ESOPHAGOGASTRODUODENOSCOPY N/A 10/01/2012   Procedure: ESOPHAGOGASTRODUODENOSCOPY (EGD);  Surgeon: Louis Meckel, MD;  Location: [facility] ENDOSCOPY;  Service: Endoscopy;  Laterality: N/A;   EYE SURGERY Right 1975   /notes 07/19/2005; "injured playing football" (09/29/2012)   GIVENS CAPSULE STUDY N/A 10/02/2012   Procedure: GIVENS CAPSULE STUDY;  Surgeon: Louis Meckel, MD;  Location: [facility] ENDOSCOPY;  Service: Endoscopy;  Laterality: N/A;   IR ANGIO VERTEBRAL SEL SUBCLAVIAN INNOMINATE UNI L MOD SED  10/22/2022   IR CT HEAD LTD  10/22/2022   IR CT HEAD LTD  10/22/2022   IR PERCUTANEOUS ART THROMBECTOMY/INFUSION INTRACRANIAL INC DIAG ANGIO  10/22/2022   JOINT REPLACEMENT  01/03/2007   LUMBAR DISC SURGERY  2001   herniated disc/notes 07/19/2005 (09/30/2012)   NEPHRECTOMY  12/26/2013   Laparascopic , [facility]   RADIOLOGY WITH ANESTHESIA N/A 10/22/2022   Procedure: IR WITH  ANESTHESIA;  Surgeon: Radiologist, Medication, MD;  Location: [facility] OR;  Service: Radiology;  Laterality: N/A;   TOTAL HIP ARTHROPLASTY Left 01/03/2007   [name] 01/03/2007 (09/30/2012)   TOTAL KNEE ARTHROPLASTY Left 09/12/2014   Procedure: LEFT TOTAL KNEE ARTHROPLASTY;  Surgeon: Tarry Kos, MD;  Location: [facility] OR;  Service: Orthopedics;  Laterality: Left;    Medication: Current Facility-Administered Medications  Medication Dose Route Frequency Provider Last Rate Last Admin   0.9 %  sodium chloride infusion   Intravenous Continuous Elijah Birk C, DO       acetaminophen (TYLENOL) tablet 1,000 mg  1,000 mg Oral TID Elijah Birk C, DO   1,000 mg at 11/08/22 0829   alum & mag hydroxide-simeth (MAALOX/MYLANTA) 200-200-20 MG/5ML suspension 30 mL  30 mL Oral Q4H PRN Love, Pamela S, PA-C       baclofen (LIORESAL) tablet 10 mg  10 mg Oral TID Elijah Birk C, DO   10 mg at 11/08/22 1610   bisacodyl (DULCOLAX) suppository 10 mg  10 mg Rectal Daily PRN Love, Pamela S, PA-C       Chlorhexidine Gluconate Cloth 2 % PADS 6 each  6 each Topical BID Elijah Birk C, DO   6 each at 11/08/22 0550   diphenhydrAMINE (BENADRYL) capsule 25 mg  25 mg Oral Q6H PRN Love, Pamela S, PA-C       escitalopram (LEXAPRO) tablet 10 mg  10 mg Oral Daily Delle Reining S, PA-C   10 mg at 11/08/22 9604   gabapentin (NEURONTIN) capsule 100 mg  100 mg Oral TID Elijah Birk C, DO   100 mg at 11/08/22 5409   guaiFENesin-dextromethorphan (ROBITUSSIN DM) 100-10 MG/5ML syrup 5-10 mL  5-10 mL Oral Q6H PRN Love, Pamela S, PA-C       lidocaine (XYLOCAINE) 2 % jelly   Topical PRN Jacquelynn Cree, PA-C   6 Application at 11/03/22 1547   loratadine (CLARITIN) tablet 10 mg  10 mg Oral Daily Elijah Birk C, DO   10 mg at 11/08/22 8119   multivitamin with minerals tablet 1 tablet  1 tablet Oral Daily Jacquelynn Cree, PA-C   1 tablet at 11/08/22 1478   Oral care mouth rinse  15 mL Mouth Rinse 4 times per day Elijah Birk C, DO   15 mL at  11/08/22 1157   Oral care mouth rinse  15 mL Mouth Rinse PRN Elijah Birk C, DO       polyethylene glycol (MIRALAX / GLYCOLAX) packet 17 g  17 g Oral BID Elijah Birk C, DO   17 g at 11/08/22 2956   rivaroxaban (XARELTO) tablet 20 mg  20 mg Oral Q supper Elijah Birk C, DO   20 mg at 11/07/22 1806   rosuvastatin (CRESTOR) tablet 40 mg  40 mg Oral Daily Jacquelynn Cree, PA-C   40 mg at 11/08/22 2130   senna-docusate (Senokot-S) tablet 2 tablet  2 tablet Oral BID Angelina Sheriff, DO  2 tablet at 11/08/22 8295   sodium phosphate (FLEET) enema 1 enema  1 enema Rectal Once PRN Love, Pamela S, PA-C       tamsulosin (FLOMAX) capsule 0.8 mg  0.8 mg Oral QPC supper Angelina Sheriff, DO   0.8 mg at 11/07/22 1806   thiamine (VITAMIN B1) tablet 100 mg  100 mg Oral Daily Jacquelynn Cree, PA-C   100 mg at 11/08/22 6213   traMADol (ULTRAM) tablet 50 mg  50 mg Oral Q6H PRN Angelina Sheriff, DO       traZODone (DESYREL) tablet 25-50 mg  25-50 mg Oral QHS PRN Love, Pamela S, PA-C        Allergies: No Known Allergies  Social History: Social History   Tobacco Use   Smoking status: Never   Smokeless tobacco: Never  Vaping Use   Vaping status: Never Used  Substance Use Topics   Alcohol use: Yes    Comment: drinks about 1-2 drinks once a week   Drug use: No    Frequency: 1.0 times per week    Types: "Crack" cocaine, Cocaine    Comment: 09/30/2012 "stopped a long time ago"    Family History Family History  Problem Relation Age of Onset   Hypertension Mother    Deep vein thrombosis Mother    Cancer Neg Hx     Review of Systems  Unable to perform ROS: Medical condition     Objective   Vital signs in last 24 hours: BP 104/75 (BP Location: Left Arm)   Pulse (!) 58   Temp 98 F (36.7 C)   Resp 20   Ht 6\' 3"  (1.905 m)   Wt 98.9 kg   SpO2 98%   BMI 27.25 kg/m   Physical Exam General: NAD, alert, resting, appropriate HEENT: /AT Pulmonary: Normal work of breathing Cardiovascular:  RRR, no cyanosis Abdomen: Soft, NTTP, nondistended GU: Foley in place draining clear yellow urine   Most Recent Labs: Lab Results  Component Value Date   WBC 3.9 (L) 11/08/2022   HGB 12.5 (L) 11/08/2022   HCT 37.9 (L) 11/08/2022   PLT 213 11/08/2022    Lab Results  Component Value Date   NA 135 11/08/2022   K 4.6 11/08/2022   CL 105 11/08/2022   CO2 23 11/08/2022   BUN 25 (H) 11/08/2022   CREATININE 1.57 (H) 11/08/2022   CALCIUM 9.0 11/08/2022    Lab Results  Component Value Date   INR 1.2 10/22/2022   APTT 27 10/22/2022     Urine Culture: @[id number] (laburin,org,[id number])@   IMAGING: No results found.  ------  Elmon Kirschner, NP Pager: [phone]   Please contact the urology consult pager with any further questions/concerns.

## 2022-11-08 NOTE — Progress Notes (Signed)
PROGRESS NOTE   Subjective/Complaints:  No events overnight. Vitals stable. Labs Cr 1.57, otherwise stable Last BM 8/26   ROS: Difficult to ascertain due to aphasia; denies pain. See HPI above  Objective:   No results found. Recent Labs    11/08/22 0624  WBC 3.9*  HGB 12.5*  HCT 37.9*  PLT 213     Recent Labs    11/08/22 0624  NA 135  K 4.6  CL 105  CO2 23  GLUCOSE 88  BUN 25*  CREATININE 1.57*  CALCIUM 9.0      Intake/Output Summary (Last 24 hours) at 11/08/2022 0823 Last data filed at 11/08/2022 0552 Gross per 24 hour  Intake 716 ml  Output 1100 ml  Net -384 ml        Physical Exam: Vital Signs Blood pressure 104/75, pulse (!) 58, temperature 98 F (36.7 C), resp. rate 20, height 6\' 3"  (1.905 m), weight 98.9 kg, SpO2 98%.  General: No apparent distress.  Laying in bed HEENT: mild left exotropia, pupils equal and round, unchanged Heart: reg rate, irregularly irregular rhythm. No murmurs rubs or gallops Chest: CTA bilaterally without wheezes, rales, or rhonchi; no distress Abdomen: Soft, non-tender, non-distended, bowel sounds positive. + Foley, draining clear urine Extremities: No clubbing, cyanosis, or edema.   Psych: Flat affect, difficult d/t aphasia Skin: Clean and intact without signs of breakdown over exposed surfaces MsK: no apparent deformities or tenderness  Neuro: Orientation assessment limited by aphasia, is oriented to self only + expressive greater than receptive aphasia, able to follow simple commands + right hemineglect -severe CN: + right facial droop, decreased shoulder shrug on the right  Strength 5 out of 5 in left upper extremity left lower extremity, with exception of left hip flexion 3-/5 due to pain Strength 0 out of 5 in right upper extremity and right lower extremity, complicated by neglect - single instance of 3/5 finger abduction on R hand not able to repeat on  exam   sensation to light touch decreased in right upper extremity; not right lower extremity.  Sensation to pain intact.  MAS 1 R elbow flexors, shoulder adductors MAS 2 R hamstrings and MAS 1+ right plantar flexors        Assessment/Plan: 1. Functional deficits which require 3+ hours per day of interdisciplinary therapy in a comprehensive inpatient rehab setting. Physiatrist is providing close team supervision and 24 hour management of active medical problems listed below. Physiatrist and rehab team continue to assess barriers to discharge/monitor patient progress toward functional and medical goals  Care Tool:  Bathing    Body parts bathed by patient: Chest, Abdomen, Right upper leg, Left upper leg, Face, Right arm   Body parts bathed by helper: Right lower leg, Left lower leg, Buttocks, Front perineal area, Left arm     Bathing assist Assist Level: Maximal Assistance - Patient 24 - 49%     Upper Body Dressing/Undressing Upper body dressing   What is the patient wearing?: Button up shirt    Upper body assist Assist Level: Moderate Assistance - Patient 50 - 74%    Lower Body Dressing/Undressing Lower body dressing      What  is the patient wearing?: Pants     Lower body assist Assist for lower body dressing: 2 Helpers     Toileting Toileting    Toileting assist Assist for toileting: 2 Helpers     Transfers Chair/bed transfer  Transfers assist  Chair/bed transfer activity did not occur: Safety/medical concerns  Chair/bed transfer assist level: Dependent - mechanical lift     Locomotion Ambulation   Ambulation assist   Ambulation activity did not occur: Safety/medical concerns          Walk 10 feet activity   Assist  Walk 10 feet activity did not occur: Safety/medical concerns        Walk 50 feet activity   Assist Walk 50 feet with 2 turns activity did not occur: Safety/medical concerns         Walk 150 feet activity   Assist  Walk 150 feet activity did not occur: Safety/medical concerns         Walk 10 feet on uneven surface  activity   Assist Walk 10 feet on uneven surfaces activity did not occur: Safety/medical concerns         Wheelchair     Assist Is the patient using a wheelchair?: Yes Type of Wheelchair: Manual    Wheelchair assist level: Maximal Assistance - Patient 25 - 49% Max wheelchair distance: 150    Wheelchair 50 feet with 2 turns activity    Assist        Assist Level: Maximal Assistance - Patient 25 - 49%   Wheelchair 150 feet activity     Assist      Assist Level: Maximal Assistance - Patient 25 - 49%   Blood pressure 104/75, pulse (!) 58, temperature 98 F (36.7 C), resp. rate 20, height 6\' 3"  (1.905 m), weight 98.9 kg, SpO2 98%.  Medical Problem List and Plan: 1. 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 Pt has hx of prior CVA with residual aphasia.              -patient may shower             -ELOS/Goals: 14-18, PT/OT/SLP min A to sup - 9/4 estimated discharge             -Order PRAFO/WHO -utilize nightly             -Continue CIR  - 8/15 cognitive status mildly reduced from yesterday, is still within normal limits from admission given documentation but given CT head 8-12 with questionable IPH, recommended serial repeat, will repeat CT head without contrast today  -8-16: CT head without significant changes, doing better cognitively today with encouragement, monitor  - 8/ 20: Cognition and participation waxes and wanes depending on day and time of day.  Overall, participates well but tends to "push"/flexor withdrawal with weightbearing on right leg which limits standing tolerance.   2.  Antithrombotics: -DVT/anticoagulation:  Pharmaceutical: Xarelto 20mg  daily w/breakfast?             -antiplatelet therapy: N/A  3. Pain Management/right sided spasticity:  Tylenol. Local  measures.  --Per chart review was on tramadol? Naltrexone? Lidocaine patches?    -8-15: Increase spasticity right lower extremity, add baclofen 10 mg 3 times daily -8-16: Pain in right lower extremity, spasticity versus neuropathic, start gabapentin 100 mg 3 times daily.  Check renal function on Monday 8/19: BL LE pain, aching today; increase tramadol to 50 mg BID; cannot  increase baclofen/gabapentin d/t aki 8/20: Using PRN tylenol frequently; schedule tylenol 1000 mg TID, switch tramadol to 50 mg Q6H PRN 8-21: Cognition is improved and pain seems fairly well-controlled, monitor  4. Mood/Behavior/Sleep: LCSW to follow for evaluation and support.              -antipsychotic agents: N/A  -Trazodone PRN  5. Neuropsych/cognition: This patient is not capable of making decisions on his own behalf.  6. Skin/Wound Care: Routine pressure relief measures  7. Fluids/Electrolytes/ Nutrition/Dysphagia: Monitor I/O. Check CMET in am             --assist with meals  -8-15: Labs improved, discontinue IV fluid 50 cc/h; check Monday eating 75-100% meals per report  -therapies to work on encouraging p.o. fluids   -8/26 IVF 500 ccs today; encourage POs  8. HTN: Home meds Amlodipine 5mg , Hydralazine 25mg  daily,  goal SBP < 180, long term goal normotensive -Monitor BP TID continue amlodipine - stable -8-15: Has been mostly normotensive, will reduce amlodipine back to 5 mg as perfusion difficulty may be contributing to cognitive difficulties at this time. -8-16: Remains hypotensive today, will DC amlodipine; no current medications -8/17-18/24 BPs improving, monitor 8/19: Orthostatic; IVF, TEDs, Binder, currently not on PO BP medications 8-22: Much better with improved water intake -stable -stable; monitor   Vitals:   11/04/22 1918 11/05/22 0456 11/05/22 1354 11/05/22 2001  BP: (!) 112/91 129/86 (!) 116/94 (!) 128/97   11/06/22 0520 11/06/22 1340 11/06/22 1608 11/06/22 1926  BP: (!) 123/94 108/81 (!)  119/91 110/85   11/07/22 0512 11/07/22 1300 11/07/22 1944 11/08/22 0551  BP: 108/86 97/72 118/88 104/75     9. A fib: Monitor HR TID. On xarelto. Heart rate controlled  - rate controlled, monitor  10.  Thrombocytopenia: Recheck platelets in am. Monitor for signs of bleeding.-- plt 156 on 10/28/22  11. Dysphagia: On D2,thins. Encourage fluid intake   - see above  12. Acute on chronic renal failure:  SCr 1.32 at admission. Appears baseline 1.3-1.4 --Continue to monitor and encourage intake  - uptrending on last labs 8/12, encourage fluids, BMP in AM -stable, reassess q. Monday - 8/19: AKI Cr 1.6 again; IVF today 75 cc/hr - 8/20: 1.4 today, DC IVF, POs excellent -8-22: Creatinine 1.3, improved.  Stable on p.o. regimen. - 8/26: Cr increase, BUN downtrending; 500 cc IVF today, recheck in AM  13. Hyponatremia: Na dropped to 132 in 48 hours. Recheck in am   - 8-15: Stable 132-135, monitor   -8/19: 136; resolved  14.  H/o CAD s/p PCI: Monitor for symptoms with increase in activity.             --continue Crestor 40mg  daily  15.  Situational depression: Lexapro 10mg  added on acute.    - Also benefit for motor recovery of UE; would not adjust  16. Hx of multiple cancers: R-RCC s/p nephrectomy 2015.  -Right adrenal pheochromocytoma  s/p right adrenalectomy 2017.  -Prostate CA s/p IMRT 2018.  Continue flomax -RUL nodule being monitored.  17. H/o Emphysema?: Noted on CT chest on care everywhere.    - stable  18. HLD. Continue statin as above  19. Substance abuse. Encourage cessation  20.  Urinary retention/Prostate CA s/p IMRT 2018.  Continue flomax.   -10/30/22 Remains incontinent but low PVRs, encourage up to bedside and monitor-- could claritin be interfering? -10/31/22 needed caths overnight; defer adjustments to weekday team 8/19: Add bethenacol 10 mg TID for retention 8/20: Increase to  25 mg TID 8-21: Increase bethanechol to 25 mg 3 times daily, and obtain renal ultrasound to  ensure no obstruction.  If no improvement in 24 hours, will replace Foley. 8-22: Remains completely incontinent, renal ultrasound shows mildly thickened and trabeculated bladder but no gross obstruction.  Replace Foley, give patient urinary rest over the weekend and DC bethanechol due to potential side effects. 8/23: non-emergent consult to urology for management recs given history of drug-induced retention and prior prostatectomy; may see over the weekend or Monday depending on availability.  Likely Foley to stay in place till discharge.  20. Constipation: -10/30/22 reported BM 2 days ago but looks like he had two yesterday but none for 2 days prior. Will schedule senokot 1 tab daily and add miralax daily PRN for now-- monitor, if oversoftened/too frequent with senokot then may just switch back to PRN but pt will have difficult time asking for PRNs -10/31/22 LBM yesterday, however has been smears since 8/17; increase bowel regimen to sennakpot-s 2 tabs BID + miralax BID -11/08/22 LBM   LOS: 13 days A FACE TO FACE EVALUATION WAS PERFORMED  Angelina Sheriff 11/08/2022, 8:23 AM

## 2022-11-08 NOTE — Progress Notes (Signed)
Occupational Therapy Session Note  Patient Details  Name: Frank Barron MRN: [id number] Date of Birth: [birth year]  Today's Date: 11/08/2022 OT Individual Time: 0918-1000 OT Individual Time Calculation (min): 42 min   Short Term Goals: Week 2:  OT Short Term Goal 1 (Week 2): Patient will complete toilet transfer with max A of 1 OT Short Term Goal 2 (Week 2): Patient will don shirt with mod A OT Short Term Goal 3 (Week 2): Pt will locate 2 items on L side of the sink with mod ques  Skilled Therapeutic Interventions/Progress Updates:    Pt greeted semi-reclined in bed awake and agreeable to OT treatment session. Nursing requested pt return to bed at end of session for IV. Pt with brighter affect this morning and initiated bed mobility, although needed assist to advance R LE towards EOB. Max A to scoot hips to the edge. Worked on threading pant legs at EOB with Pt initiating, but still max A. Max A +2 for sit<>stand without device to pull up pants with total A. Pt with decreased pushing in standing this time. Worked on sitting balance at EOB while working on [employer] Great sitting balance with overall supervision and no LOB. Hand over hand A to integrate R UE into bathing tasks. Noted BM smell so returned pt back to bed with max A. Pt incontinent of BM requiring mod A to roll to the R and total A to roll L for total A peri-care and brief change. Pt left semi-reclined in bed with bed alarm on, call bell in reach, and needs met. OT placed SAEBO e-stim on shoulder for sublux. Marland Kitchen SAEBO left on for 60 minutes. OT returned to remove SAEBO with skin intact and no adverse reactions.  Saebo Stim One 330 pulse width 35 Hz pulse rate On 8 sec/ off 8 sec Ramp up/ down 2 sec Symmetrical Biphasic wave form  Max intensity at 500 Ohm load    Therapy Documentation Precautions:  Precautions Precautions: Fall Precaution Comments: R hemiplegia, aphasia (inconsistent  yes/no) Restrictions Weight Bearing Restrictions: No Pain:  Denies pain  Therapy/Group: Individual Therapy  [name] 11/08/2022, 10:08 AM

## 2022-11-08 NOTE — Progress Notes (Signed)
Speech Language Pathology Daily Session Note  Patient Details  Name: Frank Barron MRN: [id number] Date of Birth: 1951-03-01  Today's Date: 11/08/2022 SLP Individual Time: 1301-1400 SLP Individual Time Calculation (min): 59 min  Short Term Goals: Week 2: SLP Short Term Goal 1 (Week 2): Patient will utilize consume least restrictive diet texture w/ minA visual/verbal cues to clear oral residue and R buccal pocketing SLP Short Term Goal 2 (Week 2): Pt will tolerate thin liquids via cup w/ minimal overt s/s of aspiration SLP Short Term Goal 3 (Week 2): Patient will utilize multimodal means of communication to express wants/needs with maxA SLP Short Term Goal 4 (Week 2): Pt will complete simple/functional naming tasks w/ maxA multimodal cues SLP Short Term Goal 5 (Week 2): Pt will actively participate in speech therapy tasks targeting dysphagia, motor speech, language, and/or cognition 75% of the time w/ modA verbal cues  Skilled Therapeutic Interventions: SLP conducted skilled therapy session targeting swallowing and communication goals. During receptive language tasks, SLP targeted object discrimination and categorization of pictured objects. Patient benefited from written prompt and min to modA to identify categorized items from a group of 36 (6 out of 36 matched category). Patient exhibited reduced problem solving throughout and no initiation to ask for assistance. RN entered halfway through session requesting for patient to take pain medications. SLP observed patient with medications whole in puree with patient exhibiting no overt s/sx of penetration/aspiration nor oral residue. SLP and RN discussed allowing patient to take medications whole in puree from now on and SLP updated room signs. SLP assessed patient's current diet tolerance with Dys3/thin liquids noting no overt concerns. Recommend continuation of current diet. Patient was left in lowered bed with call bell in reach and bed alarm set. SLP  will continue to target goals per plan of care.      Pain Pain Assessment Pain Scale: 0-10 Pain Score: 0-No pain  Therapy/Group: Individual Therapy  Frank Barron, M.A., CCC-SLP  [name] 11/08/2022, 1:14 PM

## 2022-11-08 NOTE — Progress Notes (Signed)
Patient ID: Frank Barron, male   DOB: 1950-11-11, 72 y.o.   MRN: [id number]   SW followed up with pt brother Frank Barron to discuss family edu. Confirms Fam edu on Wed (8/28) 9am-12pm. Reports he will be the only one present, as patient's wife is in the hospital at [facility]  after being found down at home; she has had a kidney transplant and has infections. She will not be a part of his care at time of discharge.   Cecile Sheerer, MSW, LCSWA Office: [phone] Cell: [phone] Fax: [phone]

## 2022-11-08 NOTE — Progress Notes (Signed)
Physical Therapy Session Note  Patient Details  Name: Frank Barron MRN: [id number] Date of Birth: October 14, 1950  Today's Date: 11/08/2022 PT Individual Time: 1131-1201 + 9528-4132 PT Individual Time Calculation (min): 30 min + 74 min  Short Term Goals: Week 2:  PT Short Term Goal 1 (Week 2): Pt will complete bed mobility (rolling, supine<>sit) min assist with hospital bed features PT Short Term Goal 2 (Week 2): Pt will complete sit<>stand min assist PT Short Term Goal 3 (Week 2): Pt will maintain standing with min assist x1 min PT Short Term Goal 4 (Week 2): Pt will consistently complete bed<>wc transfers with min assist  Skilled Therapeutic Interventions/Progress Updates:     SESSION 1:  Pt presents in room, lethargic and difficult to wake at first, improves with mobility. Pt unable to report pain but in no acute distress during session, agreeable to PT. Session focused on sequencing for bed mobility and mass practice for transfers to promote improved independence with functional mobility. RN present during session requesting pt remain in bed at end of session for placement of IV.  Therapist dons bilateral TED hose and shoes total assist for time management. Pt completes supine to sit EOB with min assist for RLE mgmt. Pt then completes mass practice slideboard transfers WC<> bed x4 trials to promote improved sequencing for transfers and independence with functional mobility. Pt completes slideboard with max assist and 2nd person assist for holding slideboard for R with pt demonstrating poor initiation for scooting to R, requires mod/min assist for scooting to L, poor initiation but able to continue movement once initiated scoot onto board with min assist.  Pt then completes sit to supine with mod assist for RLE into bed, verbal cues for positioning once supine. Pt remains supine with all needs wtihin reach, call light in place, and bed alarm activated at end of session.   SESSION 2:  Pt  presents in room in bed, alert and agreeable to PT. Pt unable to report pain but in no visible distress. Session focused on transfer training, standing tolerance, and WC mobility to promote improved independence with functional mobility.  Pt completes supine to sit EOB with min assist for RLE mgmt. Pt completes slideboard bed to [facility] transfer max assist to R with 2nd person holding slideboard in place. Pt transported to day room dependently for time management. Pt completes slideboard transfer to mat max assist to R and max assist for positioning. Pt completes sit<>stand x11 with pt completing NMR in standing for last 4 repetitions, for 2x3 reps marching BLE and weightshifting to L, increased pushing to R noted with fatigue. Mirror positioned in front for visual cues. Pt requires seated rest breaks between stands due to fatigue.  Pt completes slideboard transfer back to [facility] to L with mod assist. Pt completes WC mobility 175' max assist for initiating mobility with LLE and max verbal cues for maintaining path with pt demonstrating path deviation to R requiring assist to correct. Pt the self propels 150' back to room max assist for maintaining momentum.  Pt positioned for transfer and completes slideboard from [facility] back to bed to L min assist. Pt requires mod assist for BLE mgmt into bed max verbal cues. Pt remains supine in bed with all needs within reach, call light in place, and bed alarm activated at end of session.   Therapy Documentation Precautions:  Precautions Precautions: Fall Precaution Comments: R hemiplegia, aphasia (inconsistent yes/no) Restrictions Weight Bearing Restrictions: No   Therapy/Group: Individual Therapy  Luther Parody  Allie Dimmer PT, DPT 11/08/2022, 12:23 PM

## 2022-11-09 LAB — BASIC METABOLIC PANEL
Anion gap: 9 (ref 5–15)
BUN: 25 mg/dL — ABNORMAL HIGH (ref 8–23)
CO2: 22 mmol/L (ref 22–32)
Calcium: 9.1 mg/dL (ref 8.9–10.3)
Chloride: 106 mmol/L (ref 98–111)
Creatinine, Ser: 1.25 mg/dL — ABNORMAL HIGH (ref 0.61–1.24)
GFR, Estimated: >60 mL/min (ref >60)
Glucose, Bld: 89 mg/dL (ref 70–99)
Potassium: 4.5 mmol/L (ref 3.5–5.1)
Sodium: 137 mmol/L (ref 135–145)

## 2022-11-09 MED ORDER — ESCITALOPRAM OXALATE 10 MG PO TABS
20.0000 mg | ORAL_TABLET | Freq: Every day | ORAL | Status: DC
  Administered 2022-11-10 – 2022-11-19 (×8): 20 mg via ORAL
  Filled 2022-11-09 (×10): qty 2

## 2022-11-09 MED ORDER — ACETAMINOPHEN 500 MG PO TABS
1000.0000 mg | ORAL_TABLET | Freq: Three times a day (TID) | ORAL | Status: DC | PRN
  Administered 2022-11-17: 1000 mg via ORAL
  Filled 2022-11-09: qty 2

## 2022-11-09 NOTE — Progress Notes (Signed)
Speech Language Pathology Daily Session Note  Patient Details  Name: Johnaton Ormond MRN: [id number] Date of Birth: [birth year]  Today's Date: 11/09/2022 SLP Individual Time: 0801-0901 SLP Individual Time Calculation (min): 60 min  Short Term Goals: Week 2: SLP Short Term Goal 1 (Week 2): Patient will utilize consume least restrictive diet texture w/ minA visual/verbal cues to clear oral residue and R buccal pocketing SLP Short Term Goal 2 (Week 2): Pt will tolerate thin liquids via cup w/ minimal overt s/s of aspiration SLP Short Term Goal 3 (Week 2): Patient will utilize multimodal means of communication to express wants/needs with maxA SLP Short Term Goal 4 (Week 2): Pt will complete simple/functional naming tasks w/ maxA multimodal cues SLP Short Term Goal 5 (Week 2): Pt will actively participate in speech therapy tasks targeting dysphagia, motor speech, language, and/or cognition 75% of the time w/ modA verbal cues  Skilled Therapeutic Interventions:  Pt was seen in am to address dysphagia management and motor speech disorders. Pt was alert upon SLP arrival and seated upright with HOB support. SLP retrieved pt's breakfast tray consisting of Dys 3 solids and thin liquids. SLP provided set up assist and pt administered trials. Min verbal cues initially for oral clearance and single boluses at a time with cues able to be faded to sup A across session. He consumed thin liquid via cup edge intermittently throughout session. He consumed single and consecutive sips with no overt s/sx pen/asp. In additional minutes of session SLP addressed naming and utilization of multimodal communication system. Utilizing the [facility] pt challenged to verbalize single syllable objects. Pt requiring max verbal and visual cues with pt ultimately producing "ball" in two separate attempts. Pt presented with approximation of the word "sock" by producing "dock" and limited success with the word cup as indicated by adding  additional syllables to word. During session, pt received a phone call and was observed with spontaneous productions including: okay, oh man, oh Lord, yeah, no, hello, hey, and that's right. Pt was finally challenged in identifying words on makeshift AAC board. Given a FO4, pt was challenged to identify drink preferences, dentures, and glasses. Pt identified information with 75% acc indep improving to 88% acc with min A. Pt was left in bed with bed alarm active and call button within reach. SLP to continue POC.  Pain Pain Assessment Pain Scale: Faces Faces Pain Scale: No hurt  Therapy/Group: Individual Therapy  Renaee Munda 11/09/2022, 10:53 AM

## 2022-11-09 NOTE — Progress Notes (Addendum)
Occupational Therapy Session Note  Patient Details  Name: Frank Barron MRN: [id number] Date of Birth: Feb 09, 1951  Today's Date: 11/09/2022 OT Individual Time: 1100-1200 OT Individual Time Calculation (min): 60 min    Short Term Goals: Week 2:  OT Short Term Goal 1 (Week 2): Patient will complete toilet transfer with max A of 1 OT Short Term Goal 2 (Week 2): Patient will don shirt with mod A OT Short Term Goal 3 (Week 2): Pt will locate 2 items on L side of the sink with mod ques  Skilled Therapeutic Interventions/Progress Updates:    Pt greeted seated in [name] and agreeable to OT treatment session. Pt brought down to therapy gym and completed slideboard transfer with min A! Blocked practice at edge of mat for head/hips relationship with lateral scooting. Pt brought into gravity eliminated sidelying position for R UE NMR. Pt needed Pt with trace activation in shoulder. Joint input through elbow and wrist to help bring pt through full ROM; Pt brought back into sitting where we worked on visual scanning, problem solving, and sitting balance with card matching activity. Pt inially needed cues to match suits as well as color and number, but with practice, was able to match 9 cards without cues. Slideboard transfer back to wc with mod A of 1 person. Pt returned to room and left seated in [name] with alarm belt on, call bell in reach, and needs met.  OT placed SAEBO e-stim on wrist extensors. SAEBO left on for 60 minutes. OT returned to remove SAEBO with skin intact and no adverse reactions.  Saebo Stim One 330 pulse width 35 Hz pulse rate On 8 sec/ off 8 sec Ramp up/ down 2 sec Symmetrical Biphasic wave form  Max intensity at 500 Ohm load  Therapy Documentation Precautions:  Precautions Precautions: Fall Precaution Comments: R hemiplegia, aphasia (inconsistent yes/no) Restrictions Weight Bearing Restrictions: No Pain: Pain Assessment Pain Scale: Faces Faces Pain Scale: No  hurt    Therapy/Group: Individual Therapy  [name] 11/09/2022, 11:43 AM

## 2022-11-09 NOTE — Patient Care Conference (Signed)
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 Elijah Birk Social Worker Present: Cecile Sheerer, LCSWA Nurse Present: Vedia Pereyra, RN PT Present: Darrold Span, PT OT Present: Kearney Hard, OT SLP Present: Feliberto Gottron, SLP PPS Coordinator present : Fae Pippin, SLP     Current Status/Progress Goal Weekly Team Focus  Bowel/Bladder   Pt incontinent of bowel with foley catheter. LBM 11/08/22   Pt will regain B/B continence   Toilet pt q2-4 hrs and provide foley care qshift and each bowel movement    Swallow/Nutrition/ Hydration   Dys3/thin liquids, min-supervision for use of swallowing strategies   Min A  tolerance of current diet, use of swallowing strategies    ADL's   Max A +2, can do a slideboard transfer with mod/max A, some improvement with pushing. Supervision/min A sitting balance   min-mod A overall   activity tolerance, R NMR, NMES, self-care retraining, barriers are limited family support    Mobility   min/modA bed mobility, slideboard transfers mod/maxAx2, sit<>stand modAx2, WC mobility maxA   minA transfers, supervision WC  Barriers: decreased caregiver assist, STE. Continue working on Programmer, systems, WC mobility    Armed forces technical officer for multimodal communication   Mod A   yes/no accuracy, following commands, use of multimodal comunication    Safety/Cognition/ Behavioral Observations  Mod A   Mod A   sustained attention during functional tasks    Pain   Bilateral hip pain well managed with scheduled Tylenol 1000mg  q8hr   Will be free from pain   Assess pt for pain qshift/prn    Skin   Skin is intact   Will maintain skin intergrity wiithout breakdown  Assess pt skin qshift/prn for breakdown      Discharge Planning:  Discharge is pending based on level of care required at discharge. If pt discharges home at a supervision level, pt will have intermittent support from siblings. Pt wife unable to provide care due to being in the hospital. Karlene Einstein on Wed (8/28) 9am-12pm with pt brother [name]. SW will confirm there are no barriers to discharge.   Team Discussion: Left MCA/CVA. Foley for urinary retention. Addressing constipation.  Pain managed with scheduled Tylenol and Kpad. Spasms better. Severe hemi neglect. AKI. BUN/Cr trending upward.  Skin intact. Sleep chart. Dysphagia. Tolerating D3 diet. SB transfers improving but still +2 for safety due to pushing. IS every 2 hours while awake. Will require 24/7 assist.  Patient on target to meet rehab goals: Discharge set for 11/17/22. After family education will decide if going to SNF VS home.   *See Care Plan and progress notes for long and short-term goals.   Revisions to Treatment Plan:  Outpatient follow up with urology.  IV  fluids.  Recheck labs. Monitor labs/VS Teaching Needs: Medications, safety, self care, gait/transfer training, appropriate diet/texture training with swallowing strategies, etc.   Current Barriers to Discharge: Lack of/limited family support and communication, urinary retention  Possible Resolutions to Barriers: Family education Home vs SNF Improved communication techniques  Independent  with foley care Order recommended DME if needed     Medical Summary Current Status: medically complicated by spasticity/hemiparesis, urinary retetnion, bowel incontinence/constipation, AKI on CKD, dysphagia  Barriers to Discharge: Electrolyte abnormality;Incontinence;Neurogenic Bowel & Bladder;Medical stability;Self-care education;Spasticity  Barriers to Discharge Comments: pain and spasticity, foley for urinary retention/neurogenic bladder, AKI Possible Resolutions to [employer] Focus: Encouraging POs and monitorring labs for AKI, titrating pain.spasticity medication, Urology evaluation for DC foley failure   Continued Need for Acute Rehabilitation Level of Care: The patient requires daily medical management by a physician with specialized training in physical medicine and rehabilitation for the following reasons: Direction of a multidisciplinary physical rehabilitation program to maximize functional independence : Yes Medical management of patient stability for increased activity during participation in an intensive rehabilitation regime.: Yes Analysis of laboratory values and/or radiology reports with any subsequent need for medication adjustment and/or medical intervention. : Yes   I attest that I was present, lead the team conference, and concur with the assessment and plan of the team.   Jearld Adjutant 11/09/2022, 2:09 PM

## 2022-11-09 NOTE — Progress Notes (Signed)
PROGRESS NOTE   Subjective/Complaints:  No events overnight. Vitals stable. AM labs pending Large liquid BM overnight Urology evaluated yesterday, appreciate their assessment and insight.  ROS: Difficult to ascertain due to aphasia. See HPI above  Objective:   No results found. Recent Labs    11/08/22 0624  WBC 3.9*  HGB 12.5*  HCT 37.9*  PLT 213     Recent Labs    11/08/22 0624  NA 135  K 4.6  CL 105  CO2 23  GLUCOSE 88  BUN 25*  CREATININE 1.57*  CALCIUM 9.0      Intake/Output Summary (Last 24 hours) at 11/09/2022 0930 Last data filed at 11/09/2022 0758 Gross per 24 hour  Intake 957.61 ml  Output 1800 ml  Net -842.39 ml        Physical Exam: Vital Signs Blood pressure (!) 115/98, pulse 69, temperature 98.3 F (36.8 C), temperature source Oral, resp. rate 18, height 6\' 3"  (1.905 m), weight 98.9 kg, SpO2 97%.  General: No apparent distress.  Laying on side in therapy gym HEENT: mild left exotropia, pupils equal and round, unchanged Heart: reg rate, irregularly irregular rhythm. No murmurs rubs or gallops Chest: CTA bilaterally without wheezes, rales, or rhonchi; no distress Abdomen: Soft, non-tender, non-distended, bowel sounds positive. + Foley, draining clear urine Extremities: No clubbing, cyanosis, or edema.   Psych: Flat affect, difficult d/t aphasia Skin: Clean and intact without signs of breakdown over exposed surfaces MsK: no apparent deformities or tenderness  Neuro: Will not verbalize today, does follow simple commands + expressive greater than receptive aphasia + right hemineglect -severe CN: + right facial droop, decreased shoulder shrug on the right  Strength 5 out of 5 in left upper extremity left lower extremity, with exception of left hip flexion 3-/5 due to pain Strength 0 out of 5 in right upper extremity and right lower extremity, complicated by neglect -can occasionally  mobilize both with therapies   sensation to light touch decreased in right upper extremity; not right lower extremity.  Sensation to pain intact.  MAS 1 R elbow flexors, shoulder adductors, MCP and PIP flexors MAS 2 R hamstrings and MAS 1+ right plantar flexors    MSK: No shoulder subluxation, no apparent deformity.  Assessment/Plan: 1. Functional deficits which require 3+ hours per day of interdisciplinary therapy in a comprehensive inpatient rehab setting. Physiatrist is providing close team supervision and 24 hour management of active medical problems listed below. Physiatrist and rehab team continue to assess barriers to discharge/monitor patient progress toward functional and medical goals  Care Tool:  Bathing    Body parts bathed by patient: Chest, Abdomen, Right upper leg, Left upper leg, Face, Right arm   Body parts bathed by helper: Right lower leg, Left lower leg, Buttocks, Front perineal area, Left arm     Bathing assist Assist Level: Maximal Assistance - Patient 24 - 49%     Upper Body Dressing/Undressing Upper body dressing   What is the patient wearing?: Button up shirt    Upper body assist Assist Level: Moderate Assistance - Patient 50 - 74%    Lower Body Dressing/Undressing Lower body dressing  What is the patient wearing?: Pants     Lower body assist Assist for lower body dressing: 2 Helpers     Toileting Toileting    Toileting assist Assist for toileting: 2 Helpers     Transfers Chair/bed transfer  Transfers assist  Chair/bed transfer activity did not occur: Safety/medical concerns  Chair/bed transfer assist level: Dependent - mechanical lift     Locomotion Ambulation   Ambulation assist   Ambulation activity did not occur: Safety/medical concerns          Walk 10 feet activity   Assist  Walk 10 feet activity did not occur: Safety/medical concerns        Walk 50 feet activity   Assist Walk 50 feet with 2 turns  activity did not occur: Safety/medical concerns         Walk 150 feet activity   Assist Walk 150 feet activity did not occur: Safety/medical concerns         Walk 10 feet on uneven surface  activity   Assist Walk 10 feet on uneven surfaces activity did not occur: Safety/medical concerns         Wheelchair     Assist Is the patient using a wheelchair?: Yes Type of Wheelchair: Manual    Wheelchair assist level: Maximal Assistance - Patient 25 - 49% Max wheelchair distance: 150    Wheelchair 50 feet with 2 turns activity    Assist        Assist Level: Maximal Assistance - Patient 25 - 49%   Wheelchair 150 feet activity     Assist      Assist Level: Maximal Assistance - Patient 25 - 49%   Blood pressure (!) 115/98, pulse 69, temperature 98.3 F (36.8 C), temperature source Oral, resp. rate 18, height 6\' 3"  (1.905 m), weight 98.9 kg, SpO2 97%.  Medical Problem List and Plan: 1. 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 Pt has hx of prior CVA with residual aphasia.              -patient may shower             -ELOS/Goals: 14-18, PT/OT/SLP min A to sup - 9/4 estimated discharge             -Order PRAFO/WHO -utilize nightly             -Continue CIR  - 8/15 cognitive status mildly reduced from yesterday, is still within normal limits from admission given documentation but given CT head 8-12 with questionable IPH, recommended serial repeat, will repeat CT head without contrast today  -8-16: CT head without significant changes, doing better cognitively today with encouragement, monitor  - 8/ 20: Cognition and participation waxes and wanes depending on day and time of day.  Overall, participates well but tends to "push"/flexor withdrawal with weightbearing on right leg which limits standing tolerance. -8-27: Does very well intermittently with therapies, complicated by severe  and hemineglect.  Wife is in the hospital with renal infection, family discussing supports.  No current change in dispo plan.   2.  Antithrombotics: -DVT/anticoagulation:  Pharmaceutical: Xarelto 20mg  daily w/breakfast?             -antiplatelet therapy: N/A  3. Pain Management/right sided spasticity:  Tylenol. Local measures.  --Per chart review was on tramadol? Naltrexone? Lidocaine patches?    -8-15: Increase spasticity right lower extremity, add baclofen 10 mg 3 times daily -  8-16: Pain in right lower extremity, spasticity versus neuropathic, start gabapentin 100 mg 3 times daily.  Check renal function on Monday 8/19: BL LE pain, aching today; increase tramadol to 50 mg BID; cannot increase baclofen/gabapentin d/t aki 8/20: Using PRN tylenol frequently; schedule tylenol 1000 mg TID, switch tramadol to 50 mg Q6H PRN 8-21: Cognition is improved and pain seems fairly well-controlled, monitor 8-27: DC tramadol, not utilizing  4. Mood/Behavior/Sleep: LCSW to follow for evaluation and support.              -antipsychotic agents: N/A  -Trazodone PRN -not using, discontinue  5. Neuropsych/cognition: This patient is not capable of making decisions on his own behalf.  6. Skin/Wound Care: Routine pressure relief measures  7. Fluids/Electrolytes/ Nutrition/Dysphagia: Monitor I/O. Check CMET in am             --assist with meals  -8-15: Labs improved, discontinue IV fluid 50 cc/h; check Monday eating 75-100% meals per report  -therapies to work on encouraging p.o. fluids   -8/26 IVF 500 ccs today; encourage POs   8. HTN: Home meds Amlodipine 5mg , Hydralazine 25mg  daily,  goal SBP < 180, long term goal normotensive -Monitor BP TID continue amlodipine - stable -8-15: Has been mostly normotensive, will reduce amlodipine back to 5 mg as perfusion difficulty may be contributing to cognitive difficulties at this time. -8-16: Remains hypotensive today, will DC amlodipine; no current  medications -8/17-18/24 BPs improving, monitor 8/19: Orthostatic; IVF, TEDs, Binder, currently not on PO BP medications 8-22: Much better with improved water intake -stable -stable; monitor   Vitals:   11/05/22 2001 11/06/22 0520 11/06/22 1340 11/06/22 1608  BP: (!) 128/97 (!) 123/94 108/81 (!) 119/91   11/06/22 1926 11/07/22 0512 11/07/22 1300 11/07/22 1944  BP: 110/85 108/86 97/72 118/88   11/08/22 0551 11/08/22 1552 11/08/22 1957 11/09/22 0428  BP: 104/75 121/85 123/89 (!) 115/98     9. A fib: Monitor HR TID. On xarelto. Heart rate controlled  - rate controlled, monitor  10.  Thrombocytopenia: Recheck platelets in am. Monitor for signs of bleeding.-- plt 156 on 10/28/22  11. Dysphagia: On D2,thins. Encourage fluid intake   - see above  12. Acute on chronic renal failure:  SCr 1.32 at admission. Appears baseline 1.3-1.4 --Continue to monitor and encourage intake  - uptrending on last labs 8/12, encourage fluids, BMP in AM -stable, reassess q. Monday - 8/19: AKI Cr 1.6 again; IVF today 75 cc/hr - 8/20: 1.4 today, DC IVF, POs excellent -8-22: Creatinine 1.3, improved.  Stable on p.o. regimen. - 8/26: Cr increase, BUN downtrending; 500 cc IVF today, recheck in AM 8-27 -creatinine back to baseline, BUN unchanged; encourage p.o. fluids, monitor  13. Hyponatremia: Na dropped to 132 in 48 hours. Recheck in am   - 8-15: Stable 132-135, monitor   -8/19: 136; resolved  14.  H/o CAD s/p PCI: Monitor for symptoms with increase in activity.             --continue Crestor 40mg  daily  15.  Situational depression: Lexapro 10mg  added on acute.    - Also benefit for motor recovery of UE; would not adjust  -8-27: Increase Lexapro for processing speed, motor recovery.  DC trazodone, tramadol for serotoninergic risks  16. Hx of multiple cancers: R-RCC s/p nephrectomy 2015.  -Right adrenal pheochromocytoma  s/p right adrenalectomy 2017.  -Prostate CA s/p IMRT 2018.  Continue flomax -RUL  nodule being monitored.  17. H/o Emphysema?: Noted on CT chest  on care everywhere.    - stable  18. HLD. Continue statin as above  19. Substance abuse. Encourage cessation  20.  Urinary retention/Prostate CA s/p IMRT 2018.  Continue flomax.   -10/30/22 Remains incontinent but low PVRs, encourage up to bedside and monitor-- could claritin be interfering? -10/31/22 needed caths overnight; defer adjustments to weekday team 8/19: Add bethenacol 10 mg TID for retention 8/20: Increase to 25 mg TID 8-21: Increase bethanechol to 25 mg 3 times daily, and obtain renal ultrasound to ensure no obstruction.  If no improvement in 24 hours, will replace Foley. 8-22: Remains completely incontinent, renal ultrasound shows mildly thickened and trabeculated bladder but no gross obstruction.  Replace Foley, give patient urinary rest over the weekend and DC bethanechol due to potential side effects. 8/27: Appreciate urology evaluation and recommendation, maintain Foley for every 30 days, outpatient follow-up for urodynamic studies  20. Constipation: -10/30/22 reported BM 2 days ago but looks like he had two yesterday but none for 2 days prior. Will schedule senokot 1 tab daily and add miralax daily PRN for now-- monitor, if oversoftened/too frequent with senokot then may just switch back to PRN but pt will have difficult time asking for PRNs -10/31/22 LBM yesterday, however has been smears since 8/17; increase bowel regimen to sennakpot-s 2 tabs BID + miralax BID -11/08/22 LBM   LOS: 14 days A FACE TO FACE EVALUATION WAS PERFORMED  Angelina Sheriff 11/09/2022, 9:30 AM

## 2022-11-09 NOTE — Progress Notes (Signed)
Speech Language Pathology Daily Session Note  Patient Details  Name: Frank Barron MRN: [id number] Date of Birth: 03/20/1950  Today's Date: 11/09/2022 SLP Individual Time: 1400-1430 SLP Individual Time Calculation (min): 30 min  Short Term Goals: Week 2: SLP Short Term Goal 1 (Week 2): Patient will utilize consume least restrictive diet texture w/ minA visual/verbal cues to clear oral residue and R buccal pocketing SLP Short Term Goal 2 (Week 2): Pt will tolerate thin liquids via cup w/ minimal overt s/s of aspiration SLP Short Term Goal 3 (Week 2): Patient will utilize multimodal means of communication to express wants/needs with maxA SLP Short Term Goal 4 (Week 2): Pt will complete simple/functional naming tasks w/ maxA multimodal cues SLP Short Term Goal 5 (Week 2): Pt will actively participate in speech therapy tasks targeting dysphagia, motor speech, language, and/or cognition 75% of the time w/ modA verbal cues  Skilled Therapeutic Interventions:   Pt greeted in his room. RNT present, as pt requested to return to bed. SLP assisted RNT to transfer the pt back to bed via maxi move. During transfer, pt was able to answer Y/N questions re wants/needs with modA verbal cues. Cueing required 2* conflicting responses as pt would often answer Y an N or say "Y" but nod N. Once in bed, SLP facilitated functional naming tasks. Provided w/ visual cue of written words/numbers, he was able to verbalize 1-10 and the days of the week in unison w/ SLP. 20% accuracy noted d/t perseverations. Increased success noted during phrase completion task, as pt verbalized on word responses to complete automatic phrases (opposites/pairs) w/ maxA visual/verbal cues. Introduced simple responsive naming task w/ lead ins, and pt benefited from maxA visual/verbal cues to name common objects. Excellent participation noted overall today. At the end of tx tasks, he was left in his bed with the alarm set and call light within  reach. Recommend cont ST per POC.   Pain  No pain reported  Therapy/Group: Individual Therapy  Pati Gallo 11/09/2022, 5:19 PM

## 2022-11-09 NOTE — NC FL2 (Signed)
[place] MEDICAID FL2 LEVEL OF CARE FORM     IDENTIFICATION  Patient Name: Frank Barron Birthdate: 07/03/1950 Sex: male Admission Date (Current Location): 10/26/2022  [facility] and [place] Number:      Facility and Address:  [facility], [address] [place], [place] [address]      Provider Number: [id number]  Attending Physician Name and Address:  Angelina Sheriff, DO  Relative Name and Phone Number:  Nash Dimmer (brother) #[phone]; Edythe Clarity (dtr) [phone]    Current Level of Care: Hospital Recommended Level of Care: Skilled Nursing Facility Prior Approval Number:    Date Approved/Denied:   PASRR Number: [id number]  Discharge Plan: SNF    Current Diagnoses: Patient Active Problem List   Diagnosis Date Noted   Acute ischemic left middle cerebral artery (MCA) stroke (HCC) 10/26/2022   Hyponatremia 10/26/2022   Acute ischemic stroke (HCC) 10/22/2022   Middle cerebral artery embolism, left 10/22/2022   Malignant neoplasm of prostate (HCC) 09/08/2016   Severe sepsis (HCC) 01/04/2016   Thrombocytopenia (HCC) 01/04/2016   A-fib (HCC) 01/02/2016   Adrenal mass, right (HCC) 01/02/2016   Solitary pulmonary nodule 01/02/2016   H/O unilateral nephrectomy 01/02/2016   Acute renal failure superimposed on chronic kidney disease (HCC) 01/02/2016   Pyelonephritis 01/01/2016   S/P total knee replacement using cement 09/12/2014   Urinary retention    Cocaine abuse (HCC)    Acute encephalopathy    Altered mental status 02/02/2014   Acute renal failure (HCC) 02/02/2014   Hypertension 02/02/2014   DVT (deep venous thrombosis) (HCC) 02/02/2014   CAD (coronary artery disease)    Anemia 09/29/2012   Melena 09/29/2012   Aphasia as late effect of cerebrovascular accident 07/03/2012   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    Orientation RESPIRATION BLADDER Height & Weight     Self    External catheter  Weight: 218 lb 0.6 oz (98.9 kg) Height:  6\' 3"  (190.5 cm)  BEHAVIORAL SYMPTOMS/MOOD NEUROLOGICAL BOWEL NUTRITION STATUS      Incontinent Diet (D3 thin- frequent swallowing)  AMBULATORY STATUS COMMUNICATION OF NEEDS Skin   Limited Assist Verbally Normal                       Personal Care Assistance Level of Assistance  Bathing, Feeding, Dressing Bathing Assistance: Limited assistance Feeding assistance: Limited assistance Dressing Assistance: Limited assistance     Functional Limitations Info  Sight, Hearing, Speech Sight Info: Adequate Hearing Info: Adequate Speech Info: Impaired    SPECIAL CARE FACTORS FREQUENCY  PT (By licensed PT), OT (By licensed OT), Speech therapy     PT Frequency: 5xs per week OT Frequency: 5xs per week     Speech Therapy Frequency: 5xs per week      Contractures Contractures Info: Not present    Additional Factors Info  Code Status, Allergies Code Status Info: Full Allergies Info: NKA           Current Medications (11/09/2022):  This is the current hospital active medication list Current Facility-Administered Medications  Medication Dose Route Frequency Provider Last Rate Last Admin   acetaminophen (TYLENOL) tablet 1,000 mg  1,000 mg Oral TID PRN Elijah Birk C, DO       alum & mag hydroxide-simeth (MAALOX/MYLANTA) 200-200-20 MG/5ML suspension 30 mL  30 mL Oral Q4H PRN Love, Pamela S, PA-C       baclofen (LIORESAL) tablet 10 mg  10 mg  Oral TID Elijah Birk C, DO   10 mg at 11/09/22 1610   bisacodyl (DULCOLAX) suppository 10 mg  10 mg Rectal Daily PRN Love, Pamela S, PA-C       Chlorhexidine Gluconate Cloth 2 % PADS 6 each  6 each Topical BID Elijah Birk C, DO   6 each at 11/09/22 9604   diphenhydrAMINE (BENADRYL) capsule 25 mg  25 mg Oral Q6H PRN Jacquelynn Cree, PA-C       [START ON 11/10/2022] escitalopram (LEXAPRO) tablet 20 mg  20 mg Oral Daily Elijah Birk C, DO       gabapentin (NEURONTIN) capsule 100 mg  100 mg Oral TID  Elijah Birk C, DO   100 mg at 11/09/22 0754   guaiFENesin-dextromethorphan (ROBITUSSIN DM) 100-10 MG/5ML syrup 5-10 mL  5-10 mL Oral Q6H PRN Love, Pamela S, PA-C       lidocaine (XYLOCAINE) 2 % jelly   Topical PRN Jacquelynn Cree, PA-C   6 Application at 11/03/22 1547   loratadine (CLARITIN) tablet 10 mg  10 mg Oral Daily Elijah Birk C, DO   10 mg at 11/09/22 5409   multivitamin with minerals tablet 1 tablet  1 tablet Oral Daily Jacquelynn Cree, PA-C   1 tablet at 11/09/22 8119   Oral care mouth rinse  15 mL Mouth Rinse 4 times per day Elijah Birk C, DO   15 mL at 11/09/22 1146   Oral care mouth rinse  15 mL Mouth Rinse PRN Elijah Birk C, DO       polyethylene glycol (MIRALAX / GLYCOLAX) packet 17 g  17 g Oral BID Elijah Birk C, DO   17 g at 11/08/22 2018   rivaroxaban (XARELTO) tablet 20 mg  20 mg Oral Q supper Elijah Birk C, DO   20 mg at 11/08/22 1717   rosuvastatin (CRESTOR) tablet 40 mg  40 mg Oral Daily Jacquelynn Cree, PA-C   40 mg at 11/09/22 1478   senna-docusate (Senokot-S) tablet 2 tablet  2 tablet Oral BID Elijah Birk C, DO   2 tablet at 11/09/22 2956   sodium phosphate (FLEET) enema 1 enema  1 enema Rectal Once PRN Love, Pamela S, PA-C       tamsulosin (FLOMAX) capsule 0.8 mg  0.8 mg Oral QPC supper Elijah Birk C, DO   0.8 mg at 11/08/22 1717   thiamine (VITAMIN B1) tablet 100 mg  100 mg Oral Daily Jacquelynn Cree, PA-C   100 mg at 11/09/22 2130     Discharge Medications: Please see discharge summary for a list of discharge medications.  Relevant Imaging Results:  Relevant Lab Results:   Additional Information QM#[id number]  Gretchen Short, LCSW

## 2022-11-09 NOTE — Progress Notes (Signed)
Physical Therapy Session Note  Patient Details  Name: Frank Barron MRN: [id number] Date of Birth: [birth year]  Today's Date: 11/09/2022 PT Individual Time: 0935-1030 PT Individual Time Calculation (min): 55 min   Short Term Goals: Week 2:  PT Short Term Goal 1 (Week 2): Pt will complete bed mobility (rolling, supine<>sit) min assist with hospital bed features PT Short Term Goal 2 (Week 2): Pt will complete sit<>stand min assist PT Short Term Goal 3 (Week 2): Pt will maintain standing with min assist x1 min PT Short Term Goal 4 (Week 2): Pt will consistently complete bed<>wc transfers with min assist  Skilled Therapeutic Interventions/Progress Updates:    Pt presents in room in bed, agreeable to PT. Pt unable to report pain consistently but in no acute distress. Session focused on participation with self care tasks, transfer training, NMR for weightshifting to L to decrease pushing behaviors in standing position.  Pt requires max assist to don pants threaded in supine. Total assist for donning TED hose and shoes in supine for time management. Pt completes supine to sit with min assist and hospital bed rail to sit EOB. Pt able to doff shirt with supervision, dons new shirt with min assist and cues for sequencing, good seated balance during task. Pt completes sit<>stand with mod assist x2 HHA on L, max assist to complete donning pants over hips. Pt then completes slideboard transfer bed to [facility] mod assist to R with 2nd person assist for holding board, therapist initiating scooting with pt able to assist halfway through transfer however demonstrates poor gluteal clearance and insufficient wt shift for task.  Pt transported to day room dependently via [facility] for time management. Pt completes slideboard transfer to R, min assist with pt demonstrating improved initiation for scooting, 2nd person assist for holding slideboard in place. Pt then completes x10 sit<>stands mod assist x2 from EOM with mirror  positioned in front of pt as NMR to decrease R lateropulsion in standing position, cues for leaning to L with each stand, improves with practice. Pt remains standing 5-10 seconds with each stand.  Pt then completes CGA! Slideboard transfer to L mat to [facility], follows all cues and initiates without assist. Pt positioned with L side next to wall with HR to complete standing practice from [facility] with tactile and visual cue of wall for pt to make contact with, mirror positioned in front of pt with verbal cues. Pt requires max assist to stand from [facility] however min/mod assist while in standing to assist pt with leaning into wall, significant!! Decrease in R lateropulsion noted.  Pt returns to room, self propels WC with LLE and mod assist, verbal cues for direction and attendance to task. Pt remains seated in WC with all needs within reach, cal light in place and chair alarm donned and activated at end of session.   Therapy Documentation Precautions:  Precautions Precautions: Fall Precaution Comments: R hemiplegia, aphasia (inconsistent yes/no) Restrictions Weight Bearing Restrictions: No    Therapy/Group: Individual Therapy  Edwin Cap PT, DPT 11/09/2022, 4:52 PM

## 2022-11-09 NOTE — Progress Notes (Addendum)
Patient ID: Frank Barron, male   DOB: November 29, 1950, 72 y.o.   MRN: [id number]  1200- SW spoke with pt brother [name], and pt dtr [name] to provide updates from team conference. Based on Mod Asst level of care, the family does not feel they are able to provide care, and pt wife is unable to assist due to her physical health ailments and being in the hospital. SW discussed in length SNF placement process, and insurance authorization. Family will need pt to be at intermittent supervision at time of discharge from SNF. The plan is to return to home. SW informed will email list based on insurance for review, and to inform SW on preferred SNF locations. Pt brother intends to [employer] facilities.   NCPASRR# [id number]   SW sent out SNF list. SW emailed SNF list to pt brother and dtr.   SW spoke with RN [facility] to confirm SN[facility] placement. SW updated [facility] on above as well.   Cecile Sheerer, MSW, LCSWA Office: [phone] Cell: [phone] Fax: [phone]

## 2022-11-10 DIAGNOSIS — I69320 Aphasia following cerebral infarction: Secondary | ICD-10-CM

## 2022-11-10 MED ORDER — BACLOFEN 10 MG PO TABS: 10.0000 mg | ORAL_TABLET | Freq: Three times a day (TID) | ORAL | Status: DC

## 2022-11-10 MED ORDER — TAMSULOSIN HCL 0.4 MG PO CAPS: 0.8000 mg | ORAL_CAPSULE | Freq: Every day | ORAL | Status: AC

## 2022-11-10 MED ORDER — POLYETHYLENE GLYCOL 3350 17 G PO PACK: 17.0000 g | PACK | Freq: Two times a day (BID) | ORAL | Status: AC

## 2022-11-10 MED ORDER — ACETAMINOPHEN 500 MG PO TABS: 1000.0000 mg | ORAL_TABLET | Freq: Three times a day (TID) | ORAL | Status: AC | PRN

## 2022-11-10 MED ORDER — ESCITALOPRAM OXALATE 20 MG PO TABS: 20.0000 mg | ORAL_TABLET | Freq: Every day | ORAL | Status: AC

## 2022-11-10 MED ORDER — GABAPENTIN 100 MG PO CAPS: 100.0000 mg | ORAL_CAPSULE | Freq: Three times a day (TID) | ORAL | Status: AC

## 2022-11-10 MED ORDER — SENNOSIDES-DOCUSATE SODIUM 8.6-50 MG PO TABS: 2.0000 | ORAL_TABLET | Freq: Two times a day (BID) | ORAL | Status: AC

## 2022-11-10 MED ORDER — SORBITOL 70 % SOLN
15.0000 mL | Freq: Once | Status: AC
  Administered 2022-11-10: 15 mL via ORAL
  Filled 2022-11-10: qty 30

## 2022-11-10 NOTE — Progress Notes (Signed)
Speech Language Pathology Daily Session Note  Patient Details  Name: Frank Barron MRN: [id number] Date of Birth: 18-Apr-1950  Today's Date: 11/10/2022 SLP Individual Time: 1002-1031 SLP Individual Time Calculation (min): 29 min  Short Term Goals: Week 2: SLP Short Term Goal 1 (Week 2): Patient will utilize consume least restrictive diet texture w/ minA visual/verbal cues to clear oral residue and R buccal pocketing SLP Short Term Goal 2 (Week 2): Pt will tolerate thin liquids via cup w/ minimal overt s/s of aspiration SLP Short Term Goal 3 (Week 2): Patient will utilize multimodal means of communication to express wants/needs with maxA SLP Short Term Goal 4 (Week 2): Pt will complete simple/functional naming tasks w/ maxA multimodal cues SLP Short Term Goal 5 (Week 2): Pt will actively participate in speech therapy tasks targeting dysphagia, motor speech, language, and/or cognition 75% of the time w/ modA verbal cues  Skilled Therapeutic Interventions: SLP conducted skilled therapy session targeting communication goals. SLP originally scheduled for family education, however family not present throughout entire session, thus SLP conducted standard treatment tasks. During session, patient was visited by long time friend, Frank Barron. SLP alerted visitor that he was visiting during therapy session, however patient indicated he wanted visitor to stay. Patient indicated visitor was okay to listen to session after specific questioning from SLP. Visitor asked many questions throughout re: current patient status, however SLP attempted to keep answers vague while providing education to patient to uphold protection of patient information despite patient's verbal indication that sharing information with visitor was okay. Given max-totalA and frequent models, patient produced /a/, /o/, and /u/ with 80% accuracy and /i/ with 0% accuracy. Patient was left in chair with call bell in reach and chair alarm set. SLP will  continue to target goals per plan of care.       Pain Pain Assessment Pain Scale: 0-10 Pain Score: 0-No pain  Therapy/Group: Individual Therapy  Jeannie Done, M.A., CCC-SLP  [name] 11/10/2022, 10:38 AM

## 2022-11-10 NOTE — Discharge Summary (Cosign Needed Addendum)
Physician Discharge Summary  Patient ID: Frank Barron MRN: [id number] DOB/AGE: [birth year] 71 y.o.  Admit date: 10/26/2022 Discharge date: 11/19/2022  Discharge Diagnoses:  Principal Problem:   Acute ischemic left middle cerebral artery (MCA) stroke (HCC) Active Problems:   Apraxia   Anemia   Hypertension   Urinary retention   A-fib (HCC)   Solitary pulmonary nodule   Hyponatremia   Foley catheter in place   Chronic kidney disease   Discharged Condition: stable  Significant Diagnostic Studies: US RENAL  Result Date: 11/03/2022 CLINICAL DATA:  Urinary retention EXAM: RENAL / URINARY TRACT ULTRASOUND COMPLETE COMPARISON:  Noncontrast CT 01/01/2016 FINDINGS: Right Kidney: Surgically absent as per history. No abnormal soft tissue in the surgical bed. Left Kidney: Renal measurements: 11.4 x 5.9 x 5.7 cm = volume: 199.1 mL. Echogenicity within normal limits. No mass or hydronephrosis visualized. Bladder: Distended urinary bladder with slight wall thickening and trabeculation. Left ureteral jet is seen. Other: None. IMPRESSION: Previous right nephrectomy. No left-sided collecting system dilatation. Distended urinary bladder with some wall thickening and trabeculation Electronically Signed   By: Karen Kays M.D.   On: 11/03/2022 13:49   CT HEAD WO CONTRAST ( )  Result Date: 10/28/2022 CLINICAL DATA:  Follow-up examination for stroke. Mental status change. EXAM: CT HEAD WITHOUT CONTRAST TECHNIQUE: Contiguous axial images were obtained from the base of the skull through the vertex without intravenous contrast. RADIATION DOSE REDUCTION: This exam was performed according to the departmental dose-optimization program which includes automated exposure control, adjustment of the mA and/or kV according to patient size and/or use of iterative reconstruction technique. COMPARISON:  Prior CT from 10/25/2022 as well as earlier studies. FINDINGS: Brain: Evolving left MCA territory infarct again seen, most  pronounced at the left basal ganglia, relatively similar to previous MRI. Persistent hyperdensity at the left sylvian fissure, similar to prior. Given persistence, finding is consistent with small volume subarachnoid hemorrhage. Overall, this is slightly decreased from prior. Additional small volume subarachnoid noted superiorly at the high left frontal region (series 3, image 24). No other acute intracranial hemorrhage. No other acute large vessel territory infarct. No mass lesion or midline shift. No hydrocephalus or extra-axial fluid collection. Vascular: Persistent hyperdensity within the left MCA, suggesting thrombus and/or diminished flow, similar to prior. Scattered calcified atherosclerosis about the skull base. Skull: Scalp soft tissues demonstrate no acute finding. Calvarium intact. Sinuses/Orbits: Prior scleral buckle on the right. Paranasal sinuses are largely clear. Trace left mastoid effusion, of doubtful significance. Other: None. IMPRESSION: 1. Evolving left MCA territory infarct, relatively stable in size and distribution from prior. 2. Persistent hyperdensity at the left sylvian fissure and high left frontal lobe, consistent with small volume subarachnoid hemorrhage. 3. Persistent hyperdensity within the left MCA, suggesting thrombus and/or diminished flow, similar to prior. 4. No other new acute intracranial abnormality. Electronically Signed   By: Rise Mu M.D.   On: 10/28/2022 21:18    Labs:  Basic Metabolic Panel: Recent Labs  Lab 11/12/22 1249 11/15/22 0630 11/18/22 0644  NA 137 135 139  K 3.7 4.3 4.5  CL 105 105 107  CO2 22 23 27   GLUCOSE 100* 85 90  BUN 18 17 21   CREATININE 1.33* 1.29* 1.25*  CALCIUM 8.8* 9.0 9.1    CBC:    Latest Ref Rng & Units 11/18/2022    6:44 AM 11/15/2022    6:30 AM 11/11/2022    6:45 AM  CBC  WBC 4.0 - 10.5 K/uL 3.4  3.9  3.8  Hemoglobin 13.0 - 17.0 g/dL 09.3  23.5  57.3   Hematocrit 39.0 - 52.0 % 36.2  36.4  35.8   Platelets 150  - 400 K/uL 161  176  203      CBG: No results for input(s): "GLUCAP" in the last 168 hours.  Brief HPI:   Frank Barron is a 72 y.o. male with history of A-fib-on Xarelto, CAD, CHF, DVT, HTN, lumbar spondylosis with chronic pain, OA right hip, RCC, prostate cancer, stroke 2013 with residual aphasia who was admitted to [facility] in 10/22/2022 after found on the floor early in the morning.  UDS was positive for cocaine.  CT head showed possible new indeterminate infarct in left basal ganglia and CTA showed left-M1 occlusion with 65 cc ischemic penumbra.  He underwent cerebral angio with T1C1 2 revascularization and postprocedure CT head showed mild left SAH and distal parietal cortical region.  MRI brain showed new completed left basal ganglia stroke with abnormal FLAIR signal in proximal left MCA S/O slow flow versus occlusion.  Patient was not a candidate for repeat revascularization due to risk of hemorrhagic transformation.  Hospital course significant for depression and Lexapro added for mood stabilization.  On 08/12 he was found to be lethargic, nonverbal with transient left-sided weakness.  Repeat CT head showed evolving left MCA infarct with 1.2 cm time 0.8 cm hyperdense region question contrast pooling versus IPH.  He was treated with 500 cc fluid bolus with improvement.  EEG done showing mild to moderate diffuse encephalopathy without seizure.  Repeat CT head 08/15 showed evolving left MCA territory infarct relatively stable in size and persistent hypodensity left sylvian fissure and high right left frontal lobe consistent with small volume SAH.  Dr. Pearlean Brownie felt the stroke was cardioembolic in setting of A-fib on Xarelto but there is a question of compliance.  Therapy was working with patient who continued to be limited by dysphagia, aphasia, apraxia, right inattention and was requiring mod to max assist with ADLs tasks and working on pre-gait activity.  CIR was recommended to functional  decline.   Hospital Course: Frank Barron was admitted to rehab 10/26/2022 for inpatient therapies to consist of PT, ST and OT at least three hours five days a week. Past admission physiatrist, therapy team and rehab RN have worked together to provide customized collaborative inpatient rehab. Admission labs showed acute on chronic renal failure with uptrending BUN and fluids were encouraged.  He was also noted to have issues with urinary retention and Flomax was titrated 0.8 mg.  His blood pressures were monitored on TID basis and amlodipinws discontinued due to orthostatic symptoms.  Marland Kitchen    He was noted to have pain and difficulty weightbearing through right lower extremity due to increasing tone.  Baclofen was added and titrated up to 10 mg 3 times daily with some improvement. However due to ongoing issues with spasticity is tizanidine was added additionally and baclofen changed to prn on 08/29. His bowel program has been augmented with titration of MiraLAX to twice daily in addition to senna twice daily.  Serial check of labs showed worsening of renal status with rising BUN to 31/SCr- 1.62 on 08/19 despite addition of IV fluids.  He continued to have issues with urinary retention therefore bethanechol was ordered with recheck of UA which was negative for infection.  Renal ultrasound was done for workup showing distended urinary bladder with wall thickening and trabeculation.   Indwelling foley placed on 08/22 and urology was consulted for  input.  Recommendations are for follow-up in office for urodynamic study to characterize dysfunction with possible voiding trial in the future, to change Foley every 30 days as well as recommendations to avoid usage of trazodone in men.  Lamictal was discontinued.  He did have recurrent worsening of renal status and was treated briefly with IV fluids with follow-up labs showing renal status trending to baseline with creatinine around 1.3.  Recommend continue to encourage  p.o. fluids to maintain adequate hydration.  Follow-up CBC shows H&H to be stable.  Due to intermittent issues with acute on chronic renal insufficiency pharmacy recommended changing of Xarelto to Eliquis as better option on 08/29.    Swallow function has improved and he was advanced to D3, thin liquids. Writing is most effective method of communication and he requires written w/verbal cues to follow commands. 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 Family is unable to provide extensive care needed and has elected on SNF for progressive therapy. He was discharged to [facility] on 11/19/22.     Rehab course: During patient's stay in rehab weekly team conferences were held to monitor patient's progress, set goals and discuss barriers to discharge. At admission, patient required max assist with basic ADL tasks due to hemiplegia, motor planning deficits, decreased visual activity, right inattention as well as cognitive deficits affecting problem solving safety awareness and processing. He required max to total assist with mobility. He exhibited moderate oropharyngeal dysphagia with severe aphasia with question of apraxia, was 50% accurate to answer Y/N questions and  was able to follow simple one step command. He  has had improvement in activity tolerance, balance, postural control as well as ability to compensate for deficits.  He requires mod to max assist with ADL tasks. He is able to perform  max assist for sit to supine, Stedy transfer with max assit X 2 due to right lateral lean, stands X 3 with  mod to max assist X 2 for weight shifting after hamstring stretch/passive.  He is tolerating current diet without s/s of aspiration. He is able to follow commands best with written directions and is 100% accurate with min assist.      Disposition:  SNF/[facility]   Diet:   Regular, thin liquids   Special Instructions: Please assist with set up and at meals Foley care bid and prn if soiled. To change foley every 30 days--last changed on 11/05/22 3.  Patient has follow up with [facility] on 09/25 at 9 am. Facility to will to send a copy of medications, patient history, insurance card and demographic information with patient to that appointment.     Allergies as of 11/19/2022   No Known Allergies      Medication List     STOP taking these medications    lidocaine 5 % Commonly known as: LIDODERM   naltrexone 50 MG tablet Commonly known as: DEPADE   rivaroxaban 20 MG Tabs tablet Commonly known as: XARELTO       TAKE these medications    acetaminophen 500 MG tablet Commonly known as: TYLENOL Take 2 tablets (1,000 mg total) by mouth 3 (three) times daily as needed for moderate pain or headache.   amLODipine 2.5 MG tablet Commonly known as: NORVASC Take 1 tablet (2.5 mg total) by mouth daily. Start taking on: November 20, 2022 What changed:  medication strength how  much to take   apixaban 5 MG Tabs tablet Commonly known as: ELIQUIS Take 1 tablet (5 mg total) by mouth 2 (two) times daily.   baclofen 10 MG tablet Commonly known as: LIORESAL Take 1 tablet (10 mg total) by mouth 3 (three) times daily as needed for muscle spasms.   Chlorhexidine Gluconate Cloth 2 % Pads Apply 6 each topically 2 (two) times daily.   escitalopram 20 MG tablet Commonly known as: LEXAPRO Take 1 tablet (20 mg total) by mouth daily. What changed:  medication strength how much to take   gabapentin 100 MG capsule Commonly known as: NEURONTIN Take 1 capsule (100 mg total) by mouth 3 (three) times daily.   loratadine 10 MG tablet Commonly known as: CLARITIN Take 10 mg by mouth daily.   MULTIVITAMIN ADULT PO Take 1 tablet by mouth daily.   polyethylene glycol 17 g packet Commonly known as: MIRALAX / GLYCOLAX Take 17 g by mouth 2 (two) times  daily.   rosuvastatin 40 MG tablet Commonly known as: CRESTOR Take 1 tablet (40 mg total) by mouth daily.   senna-docusate 8.6-50 MG tablet Commonly known as: Senokot-S Take 2 tablets by mouth 2 (two) times daily.   tamsulosin 0.4 MG Caps capsule Commonly known as: FLOMAX Take 2 capsules (0.8 mg total) by mouth daily. What changed: how much to take   thiamine 100 MG tablet Commonly known as: Vitamin B-1 Take 100 mg by mouth daily.   tiZANidine 2 MG tablet Commonly known as: ZANAFLEX Take 1 tablet (2 mg total) by mouth 2 (two) times daily.   Voltaren Arthritis Pain 1 % Gel Generic drug: diclofenac Sodium Apply 2 g topically as needed.        Contact information for follow-up providers     [facility] Follow up.   Why: Call in 1-2 days for post hospital follow up Contact information: [address] [facility] [facility] [place] [address] [phone]         Micki Riley, MD Follow up.   Specialties: Neurology, Radiology Why: office will call you with follow up appointment Contact information: [address] [place] [address] [phone]         [facility] Follow up on 12/08/2022.   Why: Appointment at 9 am--follow up on urinary retention. Patient will need to bring medication list, medical history, insurance information and demographic. Contact information: [address] [place] [address] [phone]        Fanny Dance, MD Follow up.   Specialty: Physical Medicine and Rehabilitation Why: office will call you with follow up appointment Contact information: [address] [place] [address] [phone]              Contact information for after-discharge care     Destination     [facility] Preferred SNF .   Service: Skilled Nursing Contact information: [address] [place] [address] [phone]                      Signed: Jacquelynn Cree 11/19/2022, 12:04 PM

## 2022-11-10 NOTE — Discharge Instructions (Addendum)
Inpatient Rehab Discharge Instructions  Frank Barron Discharge date and time:  11/19/22  Activities/Precautions/ Functional Status: Activity: activity as tolerated Diet: cardiac diet and regular texture  Wound Care: none needed   Functional status:  ___ No restrictions     ___ Walk up steps independently _X__ 24/7 supervision/assistance   ___ Walk up steps with assistance ___ Intermittent supervision/assistance  ___ Bathe/dress independently ___ Walk with walker     _X__ Bathe/dress with assistance ___ Walk Independently    ___ Shower independently ___ Walk with assistance    ___ Shower with assistance _X__ No alcohol     ___ Return to work/school ________   Special Instructions:   STROKE/TIA DISCHARGE INSTRUCTIONS SMOKING Cigarette smoking nearly doubles your risk of having a stroke & is the single most alterable risk factor  If you smoke or have smoked in the last 12 months, you are advised to quit smoking for your health. Most of the excess cardiovascular risk related to smoking disappears within a year of stopping. Ask you doctor about anti-smoking medications [place] Quit Line: 1-800-QUIT NOW Free Smoking Cessation Classes [phone]  CHOLESTEROL Know your levels; limit fat & cholesterol in your diet  Lipid Panel     Component Value Date/Time   CHOL 142 10/23/2022 0618   TRIG 42 10/23/2022 0618   HDL 42 10/23/2022 0618   CHOLHDL 3.4 10/23/2022 0618   VLDL 8 10/23/2022 0618   LDLCALC 92 10/23/2022 0618     Many patients benefit from treatment even if their cholesterol is at goal. Goal: Total Cholesterol (CHOL) less than 160 Goal:  Triglycerides (TRIG) less than 150 Goal:  HDL greater than 40 Goal:  LDL (LDLCALC) less than 100   BLOOD PRESSURE American Stroke Association blood pressure target is less that 120/80 mm/Hg  Your discharge blood pressure is:  BP: (!) 117/92 Monitor your blood pressure Limit your salt and alcohol intake Many individuals will require  more than one medication for high blood pressure  DIABETES (A1c is a blood sugar average for last 3 months) Goal HGBA1c is under 7% (HBGA1c is blood sugar average for last 3 months)  Diabetes: No known diagnosis of diabetes    Lab Results  Component Value Date   HGBA1C 5.2 10/22/2022    Your HGBA1c can be lowered with medications, healthy diet, and exercise. Check your blood sugar as directed by your physician Call your physician if you experience unexplained or low blood sugars.  PHYSICAL ACTIVITY/REHABILITATION Goal is 30 minutes at least 4 days per week  Activity: No driving, Therapies: see above Return to work: N/A Activity decreases your risk of heart attack and stroke and makes your heart stronger.  It helps control your weight and blood pressure; helps you relax and can improve your mood. Participate in a regular exercise program. Talk with your doctor about the best form of exercise for you (dancing, walking, swimming, cycling).  DIET/WEIGHT Goal is to maintain a healthy weight  Your discharge diet is:  Diet Order             DIET DYS 3 Room service appropriate? Yes; Fluid consistency: Thin  Diet effective now                   liquids Your height is:  Height: 6\' 3"  (190.5 cm) Your current weight is: Weight: 98.9 kg Your Body Mass Index (BMI) is:  BMI (Calculated): 27.25 Following the type of diet specifically designed for you will help prevent another  stroke. Your goal weight range is:   Your goal Body Mass Index (BMI) is 19-24. Healthy food habits can help reduce 3 risk factors for stroke:  High cholesterol, hypertension, and excess weight.  RESOURCES Stroke/Support Group:  Call [phone]   STROKE EDUCATION PROVIDED/REVIEWED AND GIVEN TO PATIENT Stroke warning signs and symptoms How to activate emergency medical system (call 911). Medications prescribed at discharge. Need for follow-up after discharge. Personal risk factors for stroke. Pneumonia vaccine given:   Flu vaccine given:  My questions have been answered, the writing is legible, and I understand these instructions.  I will adhere to these goals & educational materials that have been provided to me after my discharge from the hospital.          My questions have been answered and I understand these instructions. I will adhere to these goals and the provided educational materials after my discharge from the hospital.  Patient/Caregiver Signature _______________________________ Date __________  Clinician Signature _______________________________________ Date __________  Please bring this form and your medication list with you to all your follow-up doctor's appointments.   Information on my medicine - ELIQUIS (apixaban)  This medication education was reviewed with me or my healthcare representative as part of my discharge preparation.    Why was Eliquis prescribed for you? Eliquis was prescribed for you to reduce the risk of a blood clot forming that can cause a stroke if you have a medical condition called atrial fibrillation (a type of irregular heartbeat).  What do You need to know about Eliquis ? Take your Eliquis TWICE DAILY - one tablet in the morning and one tablet in the evening with or without food. If you have difficulty swallowing the tablet whole please discuss with your pharmacist how to take the medication safely.  Take Eliquis exactly as prescribed by your doctor and DO NOT stop taking Eliquis without talking to the doctor who prescribed the medication.  Stopping may increase your risk of developing a stroke.  Refill your prescription before you run out.  After discharge, you should have regular check-up appointments with your healthcare provider that is prescribing your Eliquis.  In the future your dose may need to be changed if your kidney function or weight changes by a significant amount or as you get older.  What do you do if you miss a dose? If you miss a  dose, take it as soon as you remember on the same day and resume taking twice daily.  Do not take more than one dose of ELIQUIS at the same time to make up a missed dose.  Important Safety Information A possible side effect of Eliquis is bleeding. You should call your healthcare provider right away if you experience any of the following: Bleeding from an injury or your nose that does not stop. Unusual colored urine (red or dark brown) or unusual colored stools (red or black). Unusual bruising for unknown reasons. A serious fall or if you hit your head (even if there is no bleeding).  Some medicines may interact with Eliquis and might increase your risk of bleeding or clotting while on Eliquis. To help avoid this, consult your healthcare provider or pharmacist prior to using any new prescription or non-prescription medications, including herbals, vitamins, non-steroidal anti-inflammatory drugs (NSAIDs) and supplements.  This website has more information on Eliquis (apixaban): http://www.eliquis.com/eliquis/home

## 2022-11-10 NOTE — Progress Notes (Signed)
Patient ID: Frank Barron, male   DOB: 06-15-1950, 72 y.o.   MRN: [id number]  SW returned phone call to pt brother [name] who reports unable to attend fam edu today due to dtr being hospitalized late last night and she is not doing well. SW will follow-up on Friday about SNF preference, and if able to schedule family edu prior to discharge, will put in place.   Cecile Sheerer, MSW, LCSWA Office: [phone] Cell: [phone] Fax: [phone]

## 2022-11-10 NOTE — Progress Notes (Addendum)
Occupational Therapy Session Note  Patient Details  Name: Frank Barron MRN: [id number] Date of Birth: 01-01-1951  Session 1 Today's Date: 11/10/2022 OT Individual Time: 6962-9528 OT Individual Time Calculation (min): 40 min    Session 2 Today's Date: 11/10/2022 OT Missed Time: 75 Minutes Missed Time Reason: Patient unwilling/refused to participate without medical reason   Short Term Goals: Week 2:  OT Short Term Goal 1 (Week 2): Patient will complete toilet transfer with max A of 1 OT Short Term Goal 2 (Week 2): Patient will don shirt with mod A OT Short Term Goal 3 (Week 2): Pt will locate 2 items on L side of the sink with mod ques  Skilled Therapeutic Interventions/Progress Updates:    Pt received supine with no c/o pain, agreeable to OT session. He was much more engaged today and attempting to communicate via gestures and single word utterances throughout session. He rolled R with min A and L with mod A for max A donning of shorts at bed level. He came to EOB with mod A overall. Teds and shoes donned total A. Cueing for positioning EOB and mod A overall. He required mod A for slideboard transfer toward the R, requiring facilitation for technique initiation and then had great sequencing throughout transfer. Oral care and grooming tasks at the sink with set up assist. He changed his shirt with mod A to facilitate hem technique. Pt was left sitting up in the w/c with all needs met, chair alarm set, and call bell within reach.    Saebo Stim One was placed on his R bicep to facilitate muscle activation and promote R attention to limb. 60 min unattended e-stim. No c/ o pain or adverse skin reaction. 330 pulse width 35 Hz pulse rate On 8 sec/ off 8 sec Ramp up/ down 2 sec Symmetrical Biphasic wave form  Max intensity at 500 Ohm load  Session was scheduled as family education- no family members arrived.    Session 2  Pt supine, not smiling and shaking head no as soon as OT  entered room. He clearly shook his head no and stated "no" to several options for session. He declined pain or allowing OT to reposition or check brief for incontinence. He avoided eye contact. He finally stated yes when asked if he wanted OT to leave room. 75 min missed.   Therapy Documentation Precautions:  Precautions Precautions: Fall Precaution Comments: R hemiplegia, aphasia (inconsistent yes/no) Restrictions Weight Bearing Restrictions: No  Therapy/Group: Individual Therapy  Crissie Reese 11/10/2022, 6:17 AM

## 2022-11-10 NOTE — Progress Notes (Signed)
     Subjective: NAEON.  Patient was up in his wheelchair on arrival this morning.  Significant improvement in mentation compared to 2 days ago.  He was alert, oriented, and engaged with questions.  Objective: Vital signs in last 24 hours: Temp:  [98 F (36.7 C)-98.9 F (37.2 C)] 98.5 F (36.9 C) (08/28 0547) Pulse Rate:  [58-75] 58 (08/28 0547) Resp:  [18] 18 (08/28 0547) BP: (116-121)/(89-95) 117/92 (08/28 0547) SpO2:  [97 %-100 %] 97 % (08/28 0547)  Assessment/Plan:  Intake/Output from previous day: 08/27 0701 - 08/28 0700 In: 594 [P.O.:594] Out: 700 [Urine:700]  # Urinary retention  Continue Flomax  Outpatient urodynamic studies.  Please have case management or someone from his rehabilitation facility contact me once he has a bed so that I may help facilitate getting him outpatient follow-up.    If discharge is delayed, please exchange foley every 30d.   Urology will sign off at this time.  Please feel free to call with questions or acute changes.  Intake/Output this shift: Total I/O In: 240 [P.O.:240] Out: -   Physical Exam:  General: Alert and oriented CV: No cyanosis Lungs: equal chest rise Gu: Foley catheter in place draining clear yellow urine  Lab Results: Recent Labs    11/08/22 0624  HGB 12.5*  HCT 37.9*   BMET Recent Labs    11/08/22 0624 11/09/22 0828  NA 135 137  K 4.6 4.5  CL 105 106  CO2 23 22  GLUCOSE 88 89  BUN 25* 25*  CREATININE 1.57* 1.25*  CALCIUM 9.0 9.1     Studies/Results: No results found.    LOS: 15 days   Elmon Kirschner, NP [facility] Pager: [phone]  11/10/2022, 10:35 AM

## 2022-11-10 NOTE — Progress Notes (Signed)
PROGRESS NOTE   Subjective/Complaints:  No events overnight.  Exam limited by aphasia, patient denies any complaints.  Does wince in pain with mobilization of right knee. Vitals stable.   ROS: Difficult to ascertain due to aphasia. See HPI above  Objective:   No results found. Recent Labs    11/08/22 0624  WBC 3.9*  HGB 12.5*  HCT 37.9*  PLT 213     Recent Labs    11/08/22 0624 11/09/22 0828  NA 135 137  K 4.6 4.5  CL 105 106  CO2 23 22  GLUCOSE 88 89  BUN 25* 25*  CREATININE 1.57* 1.25*  CALCIUM 9.0 9.1      Intake/Output Summary (Last 24 hours) at 11/10/2022 0808 Last data filed at 11/10/2022 3295 Gross per 24 hour  Intake 236 ml  Output 700 ml  Net -464 ml        Physical Exam: Vital Signs Blood pressure (!) 117/92, pulse (!) 58, temperature 98.5 F (36.9 C), temperature source Oral, resp. rate 18, height 6\' 3"  (1.905 m), weight 98.9 kg, SpO2 97%.  General: No apparent distress.  Laying on side in bed. HEENT: mild left exotropia, pupils equal and round, unchanged Heart: reg rate, irregularly irregular rhythm. No murmurs rubs or gallops Chest: CTA bilaterally without wheezes, rales, or rhonchi; no distress Abdomen: Soft, non-tender, non-distended, bowel sounds positive. + Foley, draining clear urine Extremities: No clubbing, cyanosis, or edema.   Psych: Flat affect, difficult d/t aphasia Skin: Clean and intact without signs of breakdown over exposed surfaces MsK: no apparent deformities or tenderness  Neuro: Improved verbalizations today, able to say his own name and [facility]  + expressive greater than receptive aphasia + right hemineglect -severe CN: + right facial droop, decreased shoulder shrug on the right  Strength 5 out of 5 in left upper extremity left lower extremity, with exception of left hip flexion 3-/5 due to pain Strength 0 out of 5 in right upper extremity and right lower  extremity, complicated by neglect -can occasionally mobilize right upper extremity on exam at wrist and fingers   sensation to light touch decreased in right upper extremity; not right lower extremity.  Sensation to pain intact.  MAS 1 R elbow flexors, shoulder adductors, MCP and PIP flexors MAS 2 R hamstrings and MAS 1+ right plantar flexors    MSK: No shoulder subluxation, no apparent deformity.  Assessment/Plan: 1. Functional deficits which require 3+ hours per day of interdisciplinary therapy in a comprehensive inpatient rehab setting. Physiatrist is providing close team supervision and 24 hour management of active medical problems listed below. Physiatrist and rehab team continue to assess barriers to discharge/monitor patient progress toward functional and medical goals  Care Tool:  Bathing    Body parts bathed by patient: Chest, Abdomen, Right upper leg, Left upper leg, Face, Right arm   Body parts bathed by helper: Right lower leg, Left lower leg, Buttocks, Front perineal area, Left arm     Bathing assist Assist Level: Maximal Assistance - Patient 24 - 49%     Upper Body Dressing/Undressing Upper body dressing   What is the patient wearing?: Button up shirt    Upper  body assist Assist Level: Moderate Assistance - Patient 50 - 74%    Lower Body Dressing/Undressing Lower body dressing      What is the patient wearing?: Pants     Lower body assist Assist for lower body dressing: 2 Helpers     Toileting Toileting    Toileting assist Assist for toileting: 2 Helpers     Transfers Chair/bed transfer  Transfers assist  Chair/bed transfer activity did not occur: Safety/medical concerns  Chair/bed transfer assist level: Dependent - mechanical lift     Locomotion Ambulation   Ambulation assist   Ambulation activity did not occur: Safety/medical concerns          Walk 10 feet activity   Assist  Walk 10 feet activity did not occur: Safety/medical  concerns        Walk 50 feet activity   Assist Walk 50 feet with 2 turns activity did not occur: Safety/medical concerns         Walk 150 feet activity   Assist Walk 150 feet activity did not occur: Safety/medical concerns         Walk 10 feet on uneven surface  activity   Assist Walk 10 feet on uneven surfaces activity did not occur: Safety/medical concerns         Wheelchair     Assist Is the patient using a wheelchair?: Yes Type of Wheelchair: Manual    Wheelchair assist level: Maximal Assistance - Patient 25 - 49% Max wheelchair distance: 150    Wheelchair 50 feet with 2 turns activity    Assist        Assist Level: Maximal Assistance - Patient 25 - 49%   Wheelchair 150 feet activity     Assist      Assist Level: Maximal Assistance - Patient 25 - 49%   Blood pressure (!) 117/92, pulse (!) 58, temperature 98.5 F (36.9 C), temperature source Oral, resp. rate 18, height 6\' 3"  (1.905 m), weight 98.9 kg, SpO2 97%.  Medical Problem List and Plan: 1. 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 Pt has hx of prior CVA with residual aphasia.              -patient may shower             -ELOS/Goals: 14-18, PT/OT/SLP min A to sup - 9/4 estimated discharge             -Order PRAFO/WHO -utilize nightly             -Continue CIR  - 8/15 cognitive status mildly reduced from yesterday, is still within normal limits from admission given documentation but given CT head 8-12 with questionable IPH, recommended serial repeat, will repeat CT head without contrast today  -8-16: CT head without significant changes, doing better cognitively today with encouragement, monitor  - 8/ 20: Cognition and participation waxes and wanes depending on day and time of day.  Overall, participates well but tends to "push"/flexor withdrawal with weightbearing on right leg which limits standing  tolerance. -8-27: Does very well intermittently with therapies, complicated by severe and hemineglect.  Wife is in the hospital with renal infection, family discussing supports.  No current change in dispo plan.   2.  Antithrombotics: -DVT/anticoagulation:  Pharmaceutical: Xarelto 20mg  daily w/breakfast?             -antiplatelet therapy: N/A  3. Pain Management/right sided spasticity:  Tylenol. Local measures.  --  Per chart review was on tramadol? Naltrexone? Lidocaine patches?    -8-15: Increase spasticity right lower extremity, add baclofen 10 mg 3 times daily -8-16: Pain in right lower extremity, spasticity versus neuropathic, start gabapentin 100 mg 3 times daily.  Check renal function on Monday 8/19: BL LE pain, aching today; increase tramadol to 50 mg BID; cannot increase baclofen/gabapentin d/t aki 8/20: Using PRN tylenol frequently; schedule tylenol 1000 mg TID, switch tramadol to 50 mg Q6H PRN 8-21: Cognition is improved and pain seems fairly well-controlled, monitor 8-27: DC tramadol, not utilizing -8-28: Ongoing issues with spasticity and knee, however would avoid baclofen increase due to CKD.  Could consider Botox as outpatient.  4. Mood/Behavior/Sleep: LCSW to follow for evaluation and support.              -antipsychotic agents: N/A  -Trazodone PRN -not using, discontinue  5. Neuropsych/cognition: This patient is not capable of making decisions on his own behalf.  6. Skin/Wound Care: Routine pressure relief measures  7. Fluids/Electrolytes/ Nutrition/Dysphagia: Monitor I/O. Check CMET in am             --assist with meals  -8-15: Labs improved, discontinue IV fluid 50 cc/h; check Monday eating 75-100% meals per report  -therapies to work on encouraging p.o. fluids   -8/26 IVF 500 ccs today; encourage POs   8. HTN: Home meds Amlodipine 5mg , Hydralazine 25mg  daily,  goal SBP < 180, long term goal normotensive -Monitor BP TID continue amlodipine - stable -8-15: Has been  mostly normotensive, will reduce amlodipine back to 5 mg as perfusion difficulty may be contributing to cognitive difficulties at this time. -8-16: Remains hypotensive today, will DC amlodipine; no current medications -8/17-18/24 BPs improving, monitor 8/19: Orthostatic; IVF, TEDs, Binder, currently not on PO BP medications 8-22: Much better with improved water intake -stable -stable; monitor   Vitals:   11/06/22 1608 11/06/22 1926 11/07/22 0512 11/07/22 1300  BP: (!) 119/91 110/85 108/86 97/72   11/07/22 1944 11/08/22 0551 11/08/22 1552 11/08/22 1957  BP: 118/88 104/75 121/85 123/89   11/09/22 0428 11/09/22 1501 11/09/22 2016 11/10/22 0547  BP: (!) 115/98 116/89 (!) 121/95 (!) 117/92     9. A fib: Monitor HR TID. On xarelto. Heart rate controlled  - rate controlled, monitor  10.  Thrombocytopenia: Recheck platelets in am. Monitor for signs of bleeding.-- plt 156 on 10/28/22  11. Dysphagia: On D2,thins. Encourage fluid intake   - see above  12. Acute on chronic renal failure:  SCr 1.32 at admission. Appears baseline 1.3-1.4 --Continue to monitor and encourage intake  - uptrending on last labs 8/12, encourage fluids, BMP in AM -stable, reassess q. Monday - 8/19: AKI Cr 1.6 again; IVF today 75 cc/hr - 8/20: 1.4 today, DC IVF, POs excellent -8-22: Creatinine 1.3, improved.  Stable on p.o. regimen. - 8/26: Cr increase, BUN downtrending; 500 cc IVF today, recheck in AM 8-27 -creatinine back to baseline, BUN unchanged; encourage p.o. fluids, monitor  13. Hyponatremia: Na dropped to 132 in 48 hours. Recheck in am   - 8-15: Stable 132-135, monitor   -8/19: 136; resolved  14.  H/o CAD s/p PCI: Monitor for symptoms with increase in activity.             --continue Crestor 40mg  daily  15.  Situational depression: Lexapro 10mg  added on acute.    - Also benefit for motor recovery of UE; would not adjust  -8-27: Increase Lexapro for processing speed, motor recovery.  DC  trazodone,  tramadol for serotoninergic risks  8-28: Cognition does seem somewhat better today, will monitor for trend  16. Hx of multiple cancers: R-RCC s/p nephrectomy 2015.  -Right adrenal pheochromocytoma  s/p right adrenalectomy 2017.  -Prostate CA s/p IMRT 2018.  Continue flomax -RUL nodule being monitored.  17. H/o Emphysema?: Noted on CT chest on care everywhere.    - stable  18. HLD. Continue statin as above  19. Substance abuse. Encourage cessation  20.  Urinary retention/Prostate CA s/p IMRT 2018.  Continue flomax.   -10/30/22 Remains incontinent but low PVRs, encourage up to bedside and monitor-- could claritin be interfering? -10/31/22 needed caths overnight; defer adjustments to weekday team 8/19: Add bethenacol 10 mg TID for retention 8/20: Increase to 25 mg TID 8-21: Increase bethanechol to 25 mg 3 times daily, and obtain renal ultrasound to ensure no obstruction.  If no improvement in 24 hours, will replace Foley. 8-22: Remains completely incontinent, renal ultrasound shows mildly thickened and trabeculated bladder but no gross obstruction.  Replace Foley, give patient urinary rest over the weekend and DC bethanechol due to potential side effects. 8/27: Appreciate urology evaluation and recommendation, maintain Foley with replacement every 30 days, outpatient follow-up for urodynamic studies  20. Constipation: -10/30/22 reported BM 2 days ago but looks like he had two yesterday but none for 2 days prior. Will schedule senokot 1 tab daily and add miralax daily PRN for now-- monitor, if oversoftened/too frequent with senokot then may just switch back to PRN but pt will have difficult time asking for PRNs -10/31/22 LBM yesterday, however has been smears since 8/17; increase bowel regimen to sennakpot-s 2 tabs BID + miralax BID -11/08/22 LBM; smear 8/27 -sorbitol once this evening 8-28  LOS: 15 days A FACE TO FACE EVALUATION WAS PERFORMED  Angelina Sheriff 11/10/2022, 8:08 AM

## 2022-11-10 NOTE — Progress Notes (Signed)
Physical Therapy Session Note  Patient Details  Name: Frank Barron MRN: [id number] Date of Birth: 12/30/1950  Today's Date: 11/10/2022 PT Individual Time: 0865-7846 PT Individual Time Calculation (min): 40 min   Short Term Goals: Week 2:  PT Short Term Goal 1 (Week 2): Pt will complete bed mobility (rolling, supine<>sit) min assist with hospital bed features PT Short Term Goal 2 (Week 2): Pt will complete sit<>stand min assist PT Short Term Goal 3 (Week 2): Pt will maintain standing with min assist x1 min PT Short Term Goal 4 (Week 2): Pt will consistently complete bed<>wc transfers with min assist  Skilled Therapeutic Interventions/Progress Updates:    Pt presents in room in [facility], alert and motivated to participate with pt demonstrating high spirits at start of session. Pt unable to indicate pain but in no acute distress at start of session. Session focused on WC mobility for increased endurance as well as transfer training, however during session pt with decreasing responsiveness, groaning, and able to indicate with slight head nod that he desires to return to bed.  Pt self propels WC with LLE out of room to main gym ~200' with mod assist to initiate and maintain momentum. Pt set up with wall on L side and mirror in front to practice sit<>stand and standing balance to decrease pushing behavior. Pt attempt stand with max assist however pt with decreased anterior wt shift and gluteal lift, returns to sitting. At this point pt with increasing groaning, pt asked if he was in pain to which pt responded "no" and shook head. Pt asked if he wished to attempt to stand again to which he again responded "no." Pt transported to day room dependently for energy conservation and set up for slideboard transfer however pt demonstrating decreased responsiveness, continued groaning. Pt indicates "no" again when asked multiple attempts about pain. Pt vitals assessed and demonstrate BP 106/79, HR 75. Pt declines  participation with therapies and indicates wishing to return to bed. Pt transported dependently in WC to room, mod assist slideboard transfer to L with 2nd person stabilizing slideboard. Mod assist sit to supine for RLE mgmt and verbal cueing for technique and sequencing. Pt positioned for comfort and remains supine with all needs within reach, call ligth in place, bed alarm activated at end of session. RN notifed of pt change in status at end of session.  Therapy Documentation Precautions:  Precautions Precautions: Fall Precaution Comments: R hemiplegia, aphasia (inconsistent yes/no) Restrictions Weight Bearing Restrictions: No General: PT Amount of Missed Time (min): 20 Minutes PT Missed Treatment Reason: Patient fatigue;Patient unwilling to participate;Other (Comment) (pt with decreased responsiveness during session and unable to continue participating with therapies)   Therapy/Group: Individual Therapy  Edwin Cap PT, DPT 11/10/2022, 12:07 PM

## 2022-11-11 LAB — BASIC METABOLIC PANEL
Anion gap: 5 (ref 5–15)
BUN: 23 mg/dL (ref 8–23)
CO2: 23 mmol/L (ref 22–32)
Calcium: 8.8 mg/dL — ABNORMAL LOW (ref 8.9–10.3)
Chloride: 106 mmol/L (ref 98–111)
Creatinine, Ser: 1.96 mg/dL — ABNORMAL HIGH (ref 0.61–1.24)
GFR, Estimated: 36 mL/min — ABNORMAL LOW (ref >60)
Glucose, Bld: 87 mg/dL (ref 70–99)
Potassium: 4.4 mmol/L (ref 3.5–5.1)
Sodium: 134 mmol/L — ABNORMAL LOW (ref 135–145)

## 2022-11-11 LAB — SODIUM, URINE, RANDOM: Sodium, Ur: 81 mmol/L

## 2022-11-11 LAB — OSMOLALITY: Osmolality: 295 mOsm/kg (ref 275–295)

## 2022-11-11 LAB — CBC
HCT: 35.8 % — ABNORMAL LOW (ref 39.0–52.0)
Hemoglobin: 12.2 g/dL — ABNORMAL LOW (ref 13.0–17.0)
MCH: 29.6 pg (ref 26.0–34.0)
MCHC: 34.1 g/dL (ref 30.0–36.0)
MCV: 86.9 fL (ref 80.0–100.0)
Platelets: 203 10*3/uL (ref 150–400)
RBC: 4.12 MIL/uL — ABNORMAL LOW (ref 4.22–5.81)
RDW: 13.5 % (ref 11.5–15.5)
WBC: 3.8 10*3/uL — ABNORMAL LOW (ref 4.0–10.5)
nRBC: 0 % (ref 0.0–0.2)

## 2022-11-11 LAB — OSMOLALITY, URINE: Osmolality, Ur: 754 mOsm/kg (ref 300–900)

## 2022-11-11 MED ORDER — APIXABAN 5 MG PO TABS
5.0000 mg | ORAL_TABLET | Freq: Two times a day (BID) | ORAL | Status: DC
  Administered 2022-11-11 – 2022-11-19 (×13): 5 mg via ORAL
  Filled 2022-11-11 (×16): qty 1

## 2022-11-11 MED ORDER — BACLOFEN 10 MG PO TABS: 10.0000 mg | ORAL_TABLET | Freq: Three times a day (TID) | ORAL | Status: DC | PRN

## 2022-11-11 MED ORDER — SODIUM CHLORIDE 0.9 % IV SOLN: INTRAVENOUS | Status: AC

## 2022-11-11 NOTE — Progress Notes (Signed)
Speech Language Pathology Weekly Progress and Session Note  Patient Details  Name: Frank Barron MRN: [id number] Date of Birth: 26-May-1950  Beginning of progress report period: November 05, 2022 End of progress report period: November 11, 2022  Today's Date: 11/11/2022 SLP Individual Time: 1101-1200 SLP Individual Time Calculation (min): 59 min  Short Term Goals: Week 2: SLP Short Term Goal 1 (Week 2): Patient will utilize consume least restrictive diet texture w/ minA visual/verbal cues to clear oral residue and R buccal pocketing SLP Short Term Goal 1 - Progress (Week 2): Met SLP Short Term Goal 2 (Week 2): Pt will tolerate thin liquids via cup w/ minimal overt s/s of aspiration SLP Short Term Goal 2 - Progress (Week 2): Met SLP Short Term Goal 3 (Week 2): Patient will utilize multimodal means of communication to express wants/needs with maxA SLP Short Term Goal 3 - Progress (Week 2): Met SLP Short Term Goal 4 (Week 2): Pt will complete simple/functional naming tasks w/ maxA multimodal cues SLP Short Term Goal 4 - Progress (Week 2): Not met SLP Short Term Goal 5 (Week 2): Pt will actively participate in speech therapy tasks targeting dysphagia, motor speech, language, and/or cognition 75% of the time w/ modA verbal cues SLP Short Term Goal 5 - Progress (Week 2): Met   New Short Term Goals: Week 3: SLP Short Term Goal 1 (Week 3): STGs=LTGs 2* ELOS  Weekly Progress Updates: Patient is making excellent progress towards therapy goals meeting 4/5 short term objectives set this reporting period. Patient currently requires maxA to communicate wants/needs effectively with most effective methods remaining providing patient with written word options and/or low-tech AAC board. Patient exhibits appropriateness for upgraded diet with SLP upgrading diet to regular/thin liquids, although patient continues to demonstrate overt s/sx of penetration/aspiration with straws. Patient is able to name basic items  when provided with a list of written options. Patient's participation in therapy has improved overall, participating in 100% of sessions this reporting period. Patient and family education ongoing. Patient will continue to benefit from skilled therapy services during remainder of CIR stay.    Intensity: Minumum of 1-2 x/day, 30 to 90 minutes Frequency: 3 to 5 out of 7 days Duration/Length of Stay: 9/4 Treatment/Interventions: Dysphagia/aspiration precaution training;Internal/external aids;Speech/Language facilitation;Cueing hierarchy;Therapeutic Activities;Functional tasks;Multimodal communication approach;Patient/family education;Therapeutic Exercise  Daily Session  Skilled Therapeutic Interventions: SLP conducted skilled therapy session targeting dysphagia and communication goals. SLP assessed patient with upgraded bolus trials of regular solids and thin liquids from straw. Patient exhibited no overt oral difficulty with regular solids. With large/consecutive sips of thin liquids from straw, patient exhibited immediate cough x1. Recommend diet upgrade to regular/thin liquid diet, however continue with full supervision and NO STRAWS, cueing for slow rate and checking for oral pocketing intermittently. During communication tasks, patient required maxA to communicate multimodally. Patient able to communicate wants/needs accurately when given a field of written choices. Patient also exhibits accurate selection of items on low-tech AAC device. SLP trialed writing tasks with patient copying written words with 100% accuracy given minA, however spontaneous written productions or productions written from verbal prompts are currently 0% accurate despite max verbal assist. When given a field of 6 written words and then presented with items in close environment, patient selected correct word in 11/12 opportunities. Given patient's strength in the areas of selecting items from written field of words and locating boxes  on low-tech AAC device, patient would likely benefit from high-tech AAC device trial. SLP will plan to trial this  device at next session. Patient was left in chair with call bell in reach and chair alarm set. SLP will continue to target goals per plan of care.    Pain Pain Assessment Pain Scale: Faces Pain Score: 0-No pain Faces Pain Scale: No hurt  Therapy/Group: Individual Therapy  Jeannie Done, M.A., CCC-SLP  [name] 11/11/2022, 11:46 AM

## 2022-11-11 NOTE — Plan of Care (Signed)
  Problem: RH Balance Goal: LTG Patient will maintain dynamic sitting balance (PT) Description: LTG:  Patient will maintain dynamic sitting balance with assistance during mobility activities (PT) Goal: LTG Patient will maintain dynamic standing balance (PT) Description: LTG:  Patient will maintain dynamic standing balance with assistance during mobility activities (PT)  Problem: Sit to Stand Goal: LTG:  Patient will perform sit to stand with assistance level (PT) Description: LTG:  Patient will perform sit to stand with assistance level (PT) Flowsheets (Taken 11/11/2022 1703) LTG: PT will perform sit to stand in preparation for functional mobility with assistance level: Moderate Assistance - Patient 50 - 74% Note: Goal downgraded due to continued postural deficits and R hip pain   Problem: RH Bed Mobility Goal: LTG Patient will perform bed mobility with assist (PT) Description: LTG: Patient will perform bed mobility with assistance, with/without cues (PT). Flowsheets (Taken 11/11/2022 1703) LTG: Pt will perform bed mobility with assistance level of: Minimal Assistance - Patient > 75% Note: Goal downgraded due to continued R hemiparesis   Problem: RH Bed to Chair Transfers Goal: LTG Patient will perform bed/chair transfers w/assist (PT) Description: LTG: Patient will perform bed to chair transfers with assistance (PT). Note: With LRAD   Problem: RH Car Transfers Goal: LTG Patient will perform car transfers with assist (PT) Description: LTG: Patient will perform car transfers with assistance (PT). Outcome: Not Progressing Note: Goal discontinued due to lack of progress and unsafe expected level of function at DC   Problem: RH Ambulation Goal: LTG Patient will ambulate in controlled environment (PT) Description: LTG: Patient will ambulate in a controlled environment, # of feet with assistance (PT). Outcome: Not Progressing Note: Goal discontinued due to current level of progress,  continued R hemiparesis, and ongoing postural deficits   Problem: RH Wheelchair Mobility Goal: LTG Patient will propel w/c in controlled environment (PT) Description: LTG: Patient will propel wheelchair in controlled environment, # of feet with assist (PT) Flowsheets (Taken 11/11/2022 1703) LTG: Pt will propel w/c in controlled environ  assist needed:: Minimal Assistance - Patient > 75% Note: Goal downgraded due to current level of progress   Problem: RH Stairs Goal: LTG Patient will ambulate up and down stairs w/assist (PT) Description: LTG: Patient will ambulate up and down # of stairs with assistance (PT) Outcome: Not Progressing Note: Goal discontinued due to current level of progress and unsafe to attempt

## 2022-11-11 NOTE — Progress Notes (Signed)
Physical Therapy Session Note  Patient Details  Name: Frank Barron MRN: [id number] Date of Birth: 10-03-1950  Today's Date: 11/11/2022 PT Individual Time: 0916-1032 PT Individual Time Calculation (min): 76 min   Short Term Goals: Week 2:  PT Short Term Goal 1 (Week 2): Pt will complete bed mobility (rolling, supine<>sit) min assist with hospital bed features PT Short Term Goal 2 (Week 2): Pt will complete sit<>stand min assist PT Short Term Goal 3 (Week 2): Pt will maintain standing with min assist x1 min PT Short Term Goal 4 (Week 2): Pt will consistently complete bed<>wc transfers with min assist  Skilled Therapeutic Interventions/Progress Updates:    Pt presents in room in bed, agreeable to PT. Pt denies pain at this time as indicated with head shake no. Session focused on participation with self care tasks, bed mobility training, transfer training,WC mobility training and gait training to facilitate improved tolerance to upirght as well as funcitonal muscle fiber recruitment.  Threapist dons TED hose and pants totalA for time management with pt in supine, pt completes rolling bilaterally for donning pants, max assist roll to L, min assist roll to R. Pt completes supine to sit EOB from flat HOB min assist for RLE mgmt and repositioning at EOB. Pt then completes min assist! Slideboard transfer bed to [facility], assist for placement of slideboard however pt able to initiate movement.  Pt completes WC mobility with LLE room to day ~150' with mod assist for path deviation to R as well as initiating and maintaining momentum however pt with improved ability to complete turns to R with min verbal cues for direction only.  Pt positioned and completes transfer to R side with min assist only, able to initiate scooting and requires assist only for last 2 scoots! Pt then positioned with litegait harness while seated, completes partial stand with mod assist from elevated mat, 2nd person assist for fixing  harness to pt in standing.  Pt then completes 2x60' gait trials in Litegait with max assist for weightshifting and RLE advance and stance phase with pt unable to come into full upright posture BOS anterior to COG and pt demonstrates difficulty motor planning gait despite max verbal cues. Completed activity to promote improved standing dynamic postural stability as well as improved muscle fiber recruitment needed for functional transfers. Pt provided with DF Assist wrap to RLE with pt demonstrating improved clearance however continues to require max assist. Pt completes one trial gait with RUE wrapped to litegait handle to facilitate RUE use.  Pt returns to room and remains seated in [facility] with all needs within reach, cal light in place and chair alarm donned and activated at end of session.  Therapy Documentation Precautions:  Precautions Precautions: Fall Precaution Comments: R hemiplegia, aphasia (inconsistent yes/no) Restrictions Weight Bearing Restrictions: No   Therapy/Group: Individual Therapy  Edwin Cap PT, DPT 11/11/2022, 4:49 PM

## 2022-11-11 NOTE — Progress Notes (Signed)
PROGRESS NOTE   Subjective/Complaints: Large BM overnight.  POs recorded as good, 100% withy supervision, however now with hyponatremia and AKI. ?prerenal vs. SIADH d/t lexapro increase, will get urine studies and start IVF 75 cc/hr today.   Patient awake, alert, more interactive today. Still with severe expressive aphasia but some improved wordfinding.   ROS: Difficult to ascertain due to aphasia. See HPI above  Objective:   No results found. Recent Labs    11/11/22 0645  WBC 3.8*  HGB 12.2*  HCT 35.8*  PLT 203     Recent Labs    11/09/22 0828 11/11/22 0645  NA 137 134*  K 4.5 4.4  CL 106 106  CO2 22 23  GLUCOSE 89 87  BUN 25* 23  CREATININE 1.25* 1.96*  CALCIUM 9.1 8.8*      Intake/Output Summary (Last 24 hours) at 11/11/2022 1015 Last data filed at 11/11/2022 0840 Gross per 24 hour  Intake 472 ml  Output 800 ml  Net -328 ml        Physical Exam: Vital Signs Blood pressure 117/87, pulse 71, temperature 97.8 F (36.6 C), resp. rate 18, height 6\' 3"  (1.905 m), weight 98.9 kg, SpO2 97%.  General: No apparent distress.  Sitting up at bedside.  HEENT: mild left exotropia, pupils equal and round, unchanged Heart: reg rate, irregularly irregular rhythm. No murmurs rubs or gallops Chest: CTA bilaterally without wheezes, rales, or rhonchi; no distress Abdomen: Soft, non-tender, non-distended, bowel sounds positive. + Foley, draining clear urine Extremities: No clubbing, cyanosis, or edema.   Psych: Flat affect, difficult d/t aphasia Skin: Clean and intact without signs of breakdown over exposed surfaces MsK: no apparent deformities or tenderness  Neuro: AAOx2 with options; not to time + expressive greater than receptive aphasia - improving, remains severe + right hemineglect -severe CN: + right facial droop, decreased shoulder shrug on the right  Strength 5 out of 5 in left upper extremity left  lower extremity, with exception of left hip flexion 3-/5 due to pain Strength 0 out of 5 in right upper extremity and right lower extremity, complicated by neglect -can occasionally mobilize right upper extremity on exam at wrist and fingers   sensation to light touch intact in right upper extremity and right lower extremity per patient.  Sensation to pain intact.  MAS 1 R elbow flexors, shoulder adductors, MCP and PIP flexors MAS 2 R hamstrings and MAS 1+ right plantar flexors    MSK: No shoulder subluxation, no apparent deformity.  Assessment/Plan: 1. Functional deficits which require 3+ hours per day of interdisciplinary therapy in a comprehensive inpatient rehab setting. Physiatrist is providing close team supervision and 24 hour management of active medical problems listed below. Physiatrist and rehab team continue to assess barriers to discharge/monitor patient progress toward functional and medical goals  Care Tool:  Bathing    Body parts bathed by patient: Chest, Abdomen, Right upper leg, Left upper leg, Face, Right arm   Body parts bathed by helper: Right lower leg, Left lower leg, Buttocks, Front perineal area, Left arm     Bathing assist Assist Level: Maximal Assistance - Patient 24 - 49%  Upper Body Dressing/Undressing Upper body dressing   What is the patient wearing?: Button up shirt    Upper body assist Assist Level: Moderate Assistance - Patient 50 - 74%    Lower Body Dressing/Undressing Lower body dressing      What is the patient wearing?: Pants     Lower body assist Assist for lower body dressing: 2 Helpers     Toileting Toileting    Toileting assist Assist for toileting: 2 Helpers     Transfers Chair/bed transfer  Transfers assist  Chair/bed transfer activity did not occur: Safety/medical concerns  Chair/bed transfer assist level: Dependent - mechanical lift     Locomotion Ambulation   Ambulation assist   Ambulation activity did  not occur: Safety/medical concerns          Walk 10 feet activity   Assist  Walk 10 feet activity did not occur: Safety/medical concerns        Walk 50 feet activity   Assist Walk 50 feet with 2 turns activity did not occur: Safety/medical concerns         Walk 150 feet activity   Assist Walk 150 feet activity did not occur: Safety/medical concerns         Walk 10 feet on uneven surface  activity   Assist Walk 10 feet on uneven surfaces activity did not occur: Safety/medical concerns         Wheelchair     Assist Is the patient using a wheelchair?: Yes Type of Wheelchair: Manual    Wheelchair assist level: Maximal Assistance - Patient 25 - 49% Max wheelchair distance: 150    Wheelchair 50 feet with 2 turns activity    Assist        Assist Level: Maximal Assistance - Patient 25 - 49%   Wheelchair 150 feet activity     Assist      Assist Level: Maximal Assistance - Patient 25 - 49%   Blood pressure 117/87, pulse 71, temperature 97.8 F (36.6 C), resp. rate 18, height 6\' 3"  (1.905 m), weight 98.9 kg, SpO2 97%.  Medical Problem List and Plan: 1. 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 Pt has hx of prior CVA with residual aphasia.              -patient may shower             -ELOS/Goals: 14-18, PT/OT/SLP min A to sup - 9/4 estimated discharge             -Order PRAFO/WHO -utilize nightly             -Continue CIR  - 8/15 cognitive status mildly reduced from yesterday, is still within normal limits from admission given documentation but given CT head 8-12 with questionable IPH, recommended serial repeat, will repeat CT head without contrast today  -8-16: CT head without significant changes, doing better cognitively today with encouragement, monitor  - 8/ 20: Cognition and participation waxes and wanes depending on day and time of day.  Overall,  participates well but tends to "push"/flexor withdrawal with weightbearing on right leg which limits standing tolerance. -8-27: Does very well intermittently with therapies, complicated by severe and hemineglect.  Wife is in the hospital with renal infection, family discussing supports.  No current change in dispo plan.   2.  Antithrombotics: -DVT/anticoagulation:  Pharmaceutical: Xarelto 20mg  daily w/breakfast?             -  antiplatelet therapy: N/A  3. Pain Management/right sided spasticity:  Tylenol. Local measures.  --Per chart review was on tramadol? Naltrexone? Lidocaine patches?    -8-15: Increase spasticity right lower extremity, add baclofen 10 mg 3 times daily -8-16: Pain in right lower extremity, spasticity versus neuropathic, start gabapentin 100 mg 3 times daily.  Check renal function on Monday 8/19: BL LE pain, aching today; increase tramadol to 50 mg BID; cannot increase baclofen/gabapentin d/t aki 8/20: Using PRN tylenol frequently; schedule tylenol 1000 mg TID, switch tramadol to 50 mg Q6H PRN 8-21: Cognition is improved and pain seems fairly well-controlled, monitor 8-27: DC tramadol, not utilizing -8-28: Ongoing issues with spasticity and knee, however would avoid baclofen increase due to CKD.  Could consider Botox as outpatient. 8/29: Make baclofen PRN d/t AKI  4. Mood/Behavior/Sleep: LCSW to follow for evaluation and support.              -antipsychotic agents: N/A  -Trazodone PRN -not using, discontinue  5. Neuropsych/cognition: This patient is not capable of making decisions on his own behalf.  6. Skin/Wound Care: Routine pressure relief measures  7. Fluids/Electrolytes/ Nutrition/Dysphagia: Monitor I/O. Check CMET in am             --assist with meals  -8-15: Labs improved, discontinue IV fluid 50 cc/h; check Monday eating 75-100% meals per report  -therapies to work on encouraging p.o. fluids   -8/26 IVF 500 ccs today; encourage POs 8/29: IVF 75 cc/hr   8.  HTN: Home meds Amlodipine 5mg , Hydralazine 25mg  daily,  goal SBP < 180, long term goal normotensive -Monitor BP TID continue amlodipine - stable -8-15: Has been mostly normotensive, will reduce amlodipine back to 5 mg as perfusion difficulty may be contributing to cognitive difficulties at this time. -8-16: Remains hypotensive today, will DC amlodipine; no current medications -8/17-18/24 BPs improving, monitor 8/19: Orthostatic; IVF, TEDs, Binder, currently not on PO BP medications 8-22: Much better with improved water intake -stable -stable; monitor   Vitals:   11/07/22 1300 11/07/22 1944 11/08/22 0551 11/08/22 1552  BP: 97/72 118/88 104/75 121/85   11/08/22 1957 11/09/22 0428 11/09/22 1501 11/09/22 2016  BP: 123/89 (!) 115/98 116/89 (!) 121/95   11/10/22 0547 11/10/22 1339 11/10/22 2031 11/11/22 0524  BP: (!) 117/92 (!) 123/90 104/72 117/87     9. A fib: Monitor HR TID. On xarelto. Heart rate controlled  - rate controlled, monitor  10.  Thrombocytopenia: Recheck platelets in am. Monitor for signs of bleeding.-- plt 156 on 10/28/22  11. Dysphagia: On D2,thins. Encourage fluid intake   - see above  12. Acute on chronic renal failure:  SCr 1.32 at admission. Appears baseline 1.3-1.4 --Continue to monitor and encourage intake  - uptrending on last labs 8/12, encourage fluids, BMP in AM -stable, reassess q. Monday - 8/19: AKI Cr 1.6 again; IVF today 75 cc/hr - 8/20: 1.4 today, DC IVF, POs excellent -8-22: Creatinine 1.3, improved.  Stable on p.o. regimen. - 8/26: Cr increase, BUN downtrending; 500 cc IVF today, recheck in AM 8-27 -creatinine back to baseline, BUN unchanged; encourage p.o. fluids, monitor 8/29: AKI; labs appear volume depletion/pre-renal; encourage PO fluids and IVF today, BMP in AM  13. Hyponatremia: Na dropped to 132 in 48 hours. Recheck in am   - 8-15: Stable 132-135, monitor   -8/19: 136; resolved  14.  H/o CAD s/p PCI: Monitor for symptoms with increase in  activity.             --  continue Crestor 40mg  daily  15.  Situational depression: Lexapro 10mg  added on acute.    - Also benefit for motor recovery of UE; would not adjust  -8-27: Increase Lexapro for processing speed, motor recovery.  DC trazodone, tramadol for serotoninergic risks  8-28: Cognition does seem somewhat better today, will monitor for trend  8/29: Monitor BMPs given new hyponatremia, however likely d/t dehydration, Tx as above  16. Hx of multiple cancers: R-RCC s/p nephrectomy 2015.  -Right adrenal pheochromocytoma  s/p right adrenalectomy 2017.  -Prostate CA s/p IMRT 2018.  Continue flomax -RUL nodule being monitored.  17. H/o Emphysema?: Noted on CT chest on care everywhere.    - stable  18. HLD. Continue statin as above  19. Substance abuse. Encourage cessation  20.  Urinary retention/Prostate CA s/p IMRT 2018.  Continue flomax.   -10/30/22 Remains incontinent but low PVRs, encourage up to bedside and monitor-- could claritin be interfering? -10/31/22 needed caths overnight; defer adjustments to weekday team 8/19: Add bethenacol 10 mg TID for retention 8/20: Increase to 25 mg TID 8-21: Increase bethanechol to 25 mg 3 times daily, and obtain renal ultrasound to ensure no obstruction.  If no improvement in 24 hours, will replace Foley. 8-22: Remains completely incontinent, renal ultrasound shows mildly thickened and trabeculated bladder but no gross obstruction.  Replace Foley, give patient urinary rest over the weekend and DC bethanechol due to potential side effects. 8/27: Appreciate urology evaluation and recommendation, maintain Foley with replacement every 30 days, outpatient follow-up for urodynamic studies  20. Constipation: -10/30/22 reported BM 2 days ago but looks like he had two yesterday but none for 2 days prior. Will schedule senokot 1 tab daily and add miralax daily PRN for now-- monitor, if oversoftened/too frequent with senokot then may just switch back to  PRN but pt will have difficult time asking for PRNs -10/31/22 LBM yesterday, however has been smears since 8/17; increase bowel regimen to sennakpot-s 2 tabs BID + miralax BID -11/08/22 LBM; smear 8/27 -sorbitol once this evening 8-28 Large BM 8/28  LOS: 16 days A FACE TO FACE EVALUATION WAS PERFORMED  Angelina Sheriff 11/11/2022, 10:15 AM

## 2022-11-11 NOTE — Plan of Care (Signed)
  Problem: Consults Goal: RH STROKE PATIENT EDUCATION Description: See Patient Education module for education specifics  Outcome: Progressing   Problem: RH BOWEL ELIMINATION Goal: RH STG MANAGE BOWEL WITH ASSISTANCE Description: STG Manage Bowel with mod I Assistance. Outcome: Progressing Goal: RH STG MANAGE BOWEL W/MEDICATION W/ASSISTANCE Description: STG Manage Bowel with Medication with mod I Assistance. Outcome: Progressing   Problem: RH BLADDER ELIMINATION Goal: RH STG MANAGE BLADDER WITH ASSISTANCE Description: STG Manage Bladder With mod I Assistance Outcome: Progressing   Problem: RH SAFETY Goal: RH STG ADHERE TO SAFETY PRECAUTIONS W/ASSISTANCE/DEVICE Description: STG Adhere to Safety Precautions With cues Assistance/Device. Outcome: Progressing   Problem: RH KNOWLEDGE DEFICIT Goal: RH STG INCREASE KNOWLEDGE OF DIABETES Description:  Patient and family will be able to manage DM with medication and dietary modification using educational resources independently Outcome: Progressing Goal: RH STG INCREASE KNOWLEDGE OF HYPERTENSION Description: Patient and family will be able to manage HTN with medication and dietary modification using educational resources independently Outcome: Progressing Goal: RH STG INCREASE KNOWLEDGE OF DYSPHAGIA/FLUID INTAKE Description: Patient and family will be able to manage Dysphagia;medication and dietary modification using educational resources independently Outcome: Progressing Goal: RH STG INCREASE KNOWLEGDE OF HYPERLIPIDEMIA Description: Patient and family will be able to manage HLD with medication and dietary modification using educational resources independently Outcome: Progressing Goal: RH STG INCREASE KNOWLEDGE OF STROKE PROPHYLAXIS Description: Patient and family will be able to manage secondary risks with medication and dietary modification using educational resources independently Outcome: Progressing

## 2022-11-11 NOTE — Progress Notes (Signed)
Occupational Therapy Session Note  Patient Details  Name: Frank Barron MRN: [id number] Date of Birth: 20-Apr-1950  Today's Date: 11/11/2022 OT Individual Time: 1300-1415 OT Individual Time Calculation (min): 75 min    Short Term Goals: Week 2:  OT Short Term Goal 1 (Week 2): Patient will complete toilet transfer with max A of 1 OT Short Term Goal 2 (Week 2): Patient will don shirt with mod A OT Short Term Goal 3 (Week 2): Pt will locate 2 items on L side of the sink with mod ques  Skilled Therapeutic Interventions/Progress Updates:    Pt greeted seated in wc. Pt trying to tell OT something but just kept pointing out at the door. OT attempted to use communication board but unsuccessful with communication. Lunch cart then pulled up and pt said "yeah, yeah!" Pointing to the cart. OT asked pt if he had eaten lunch and he stated "no!" Pt's lunch tray was set-up with OT assist to cut food into smaller pieces. Min cues to check for oral residua on R side. Worked on visual scanning to the R with food items placed on R side. Pt able to locate these items without cues. Pt brought to therapy gym in wc and addressed L UE NMR with cup stacking activity at table. OT provided stretching and gentle ROM to decrease tone prior to activity. Pt able to use tone to assist with grasp with OT providing hand over hand A. OT placed kinesiotape to R shoulder for sublux support. Pt returned to room and completed min A slidebaord transfer to the L side. Pt left semi-reclined in bed with call bell in reach, bed alarm on, and needs met. OT placed SAEBO e-stim on wrist extensors. SAEBO left on for 60 minutes. OT returned to remove SAEBO with skin intact and no adverse reactions.  [place] Stim One 330 pulse width 35 Hz pulse rate On 8 sec/ off 8 sec Ramp up/ down 2 sec Symmetrical Biphasic wave form  Max intensity at 500 Ohm load   Therapy Documentation Precautions:  Precautions Precautions: Fall Precaution  Comments: R hemiplegia, aphasia (inconsistent yes/no) Restrictions Weight Bearing Restrictions: No  Pain: Pain Assessment Pain Scale: Faces Pain Score: 0-No pain Faces Pain Scale: No hurt   Therapy/Group: Individual Therapy  [name] 11/11/2022, 1:28 PM

## 2022-11-11 NOTE — Progress Notes (Addendum)
Pt was on Xarelto for AF. Due to his questionable adherence, the CVA may been a contributing factor due to that. His scr up to 1.96 today. D/w Dr Shearon Stalls, apixaban may be a better option here. She would also like to change his baclofen to PRN rather than being schedule.  Dc Xarelto Apixaban 5mg  PO BID (age<80, wt>60kg)  Frank Barron, PharmD, BCIDP, AAHIVP, CPP Infectious Disease Pharmacist 11/11/2022 8:53 AM

## 2022-11-11 NOTE — Progress Notes (Signed)
Physical Therapy Weekly Progress Note  Patient Details  Name: Frank Barron MRN: [id number] Date of Birth: 07-Sep-1950  Beginning of progress report period: November 05, 2022 End of progress report period: November 11, 2022  Today's Date: 11/11/2022  Patient has met 0 of 4 short term goals.  Pt making slow progress towards functional goals. Pt requires min assist for supine to sit however continues to require max assist for rolling to unaffected side. Pt has progressed to slideboard transfers this week however inconsistent performance ranging from 1 person CGA assist to 2 person mod assist. Pt has improved WC mobility to mod assist with LLE and would benefit from standard WC to practice hemi technique however R hip pain as well as tonal and postural abnormalities still make progressing out of TIS inappropriate at this time. Continue plan of care  Patient continues to demonstrate the following deficits muscle weakness, decreased cardiorespiratoy endurance, impaired timing and sequencing, abnormal tone, unbalanced muscle activation, motor apraxia, ataxia, decreased coordination, and decreased motor planning, decreased visual perceptual skills, decreased midline orientation and right side neglect, decreased initiation, decreased attention, decreased awareness, decreased problem solving, decreased safety awareness, decreased memory, and delayed processing, and decreased sitting balance, decreased standing balance, decreased postural control, hemiplegia, and decreased balance strategies and therefore will continue to benefit from skilled PT intervention to increase functional independence with mobility.  Patient not progressing toward long term goals.  See goal revision..  Plan of care revisions: discontinued car transfer, ambulation, and stair goals; downgraded sit<>stand, bed mobility, WC mobility goals.  PT Short Term Goals Week 3:  PT Short Term Goal 1 (Week 3): STG = LTG due to ELOS  Skilled  Therapeutic Interventions/Progress Updates:  Ambulation/gait training;DME/adaptive equipment instruction;Stair training;UE/LE Strength taining/ROM;Wheelchair propulsion/positioning;Neuromuscular re-education;Community reintegration;Balance/vestibular training;Discharge planning;Functional electrical stimulation;Pain management;Therapeutic Activities;UE/LE Coordination activities;Cognitive remediation/compensation;Disease management/prevention;Functional mobility training;Patient/family education;Splinting/orthotics;Therapeutic Exercise;Visual/perceptual remediation/compensation   Therapy Documentation Precautions:  Precautions Precautions: Fall Precaution Comments: R hemiplegia, aphasia (inconsistent yes/no) Restrictions Weight Bearing Restrictions: No   Edwin Cap PT, DPT 11/11/2022, 4:56 PM

## 2022-11-12 LAB — BASIC METABOLIC PANEL
Anion gap: 10 (ref 5–15)
BUN: 18 mg/dL (ref 8–23)
CO2: 22 mmol/L (ref 22–32)
Calcium: 8.8 mg/dL — ABNORMAL LOW (ref 8.9–10.3)
Chloride: 105 mmol/L (ref 98–111)
Creatinine, Ser: 1.33 mg/dL — ABNORMAL HIGH (ref 0.61–1.24)
GFR, Estimated: 58 mL/min — ABNORMAL LOW (ref >60)
Glucose, Bld: 100 mg/dL — ABNORMAL HIGH (ref 70–99)
Potassium: 3.7 mmol/L (ref 3.5–5.1)
Sodium: 137 mmol/L (ref 135–145)

## 2022-11-12 NOTE — Progress Notes (Addendum)
PROGRESS NOTE   Subjective/Complaints: Patient seen at bedside.  Pleasant, appropriate.  Denies any pain or discomfort today, does continue to wince on range of motion of right lower extremity but shortly thereafter denies pain.  ROS: Difficult to ascertain due to aphasia. See HPI above  Objective:   No results found. Recent Labs    11/11/22 0645  WBC 3.8*  HGB 12.2*  HCT 35.8*  PLT 203     Recent Labs    11/11/22 0645  NA 134*  K 4.4  CL 106  CO2 23  GLUCOSE 87  BUN 23  CREATININE 1.96*  CALCIUM 8.8*      Intake/Output Summary (Last 24 hours) at 11/12/2022 1155 Last data filed at 11/12/2022 0750 Gross per 24 hour  Intake 1172.61 ml  Output 1750 ml  Net -577.39 ml        Physical Exam: Vital Signs Blood pressure (!) 132/109, pulse (!) 56, temperature 97.8 F (36.6 C), resp. rate 18, height 6\' 3"  (1.905 m), weight 98.9 kg, SpO2 98%.  General: No apparent distress.  Sitting up in bed. HEENT: mild left exotropia, pupils equal and round, unchanged Heart: reg rate, irregular rhythm. No murmurs rubs or gallops Chest: CTA bilaterally without wheezes, rales, or rhonchi; no distress Abdomen: Soft, non-tender, non-distended, bowel sounds positive and normoactive. + Foley, draining clear urine, mild odor Extremities: No clubbing, cyanosis, or edema.  Bilateral TED hose donned. Psych: Flat affect, difficult d/t aphasia.  Does have increasing smiling, interactiveness. Skin: Clean and intact without signs of breakdown over exposed surfaces MsK: no apparent deformities or tenderness.  Right shoulder taped, no palpable subluxation.  Neuro: AAO to place only, not self or time with options today. + expressive greater than receptive aphasia - improving, remains severe + right hemineglect -severe CN: + right facial droop, decreased shoulder shrug on the right -unchanged  Strength 5 out of 5 in left upper extremity  left lower extremity, with exception of left hip flexion 3-/5 due to pain Strength 0 out of 5 in right upper extremity and right lower extremity, complicated by neglect -can occasionally mobilize right upper extremity on exam at wrist and fingers   sensation to light touch intact in right upper extremity and right lower extremity per patient.  Sensation to pain intact.  MAS 1 R elbow flexors, shoulder adductors, MCP and PIP flexors MAS 2 R hamstrings and MAS 2 right plantar flexors  -mildly increased since reducing baclofen .  Assessment/Plan: 1. Functional deficits which require 3+ hours per day of interdisciplinary therapy in a comprehensive inpatient rehab setting. Physiatrist is providing close team supervision and 24 hour management of active medical problems listed below. Physiatrist and rehab team continue to assess barriers to discharge/monitor patient progress toward functional and medical goals  Care Tool:  Bathing    Body parts bathed by patient: Chest, Abdomen, Right upper leg, Left upper leg, Face, Right arm   Body parts bathed by helper: Right lower leg, Left lower leg, Buttocks, Front perineal area, Left arm     Bathing assist Assist Level: Maximal Assistance - Patient 24 - 49%     Upper Body Dressing/Undressing Upper body dressing  What is the patient wearing?: Button up shirt    Upper body assist Assist Level: Moderate Assistance - Patient 50 - 74%    Lower Body Dressing/Undressing Lower body dressing      What is the patient wearing?: Pants     Lower body assist Assist for lower body dressing: 2 Helpers     Toileting Toileting    Toileting assist Assist for toileting: 2 Helpers     Transfers Chair/bed transfer  Transfers assist  Chair/bed transfer activity did not occur: Safety/medical concerns  Chair/bed transfer assist level: Minimal Assistance - Patient > 75%     Locomotion Ambulation   Ambulation assist   Ambulation activity did  not occur: Safety/medical concerns  Assist level: 2 helpers Assistive device: Lite Gait Max distance: 50'   Walk 10 feet activity   Assist  Walk 10 feet activity did not occur: Safety/medical concerns  Assist level: 2 helpers Assistive device: Lite Gait   Walk 50 feet activity   Assist Walk 50 feet with 2 turns activity did not occur: Safety/medical concerns  Assist level: 2 helpers Assistive device: Lite Gait    Walk 150 feet activity   Assist Walk 150 feet activity did not occur: Safety/medical concerns         Walk 10 feet on uneven surface  activity   Assist Walk 10 feet on uneven surfaces activity did not occur: Safety/medical concerns         Wheelchair     Assist Is the patient using a wheelchair?: Yes Type of Wheelchair: Manual    Wheelchair assist level: Moderate Assistance - Patient 50 - 74% Max wheelchair distance: 150'    Wheelchair 50 feet with 2 turns activity    Assist        Assist Level: Moderate Assistance - Patient 50 - 74%   Wheelchair 150 feet activity     Assist      Assist Level: Moderate Assistance - Patient 50 - 74%   Blood pressure (!) 132/109, pulse (!) 56, temperature 97.8 F (36.6 C), resp. rate 18, height 6\' 3"  (1.905 m), weight 98.9 kg, SpO2 98%.  Medical Problem List and Plan: 1. 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 Pt has hx of prior CVA with residual aphasia.              -patient may shower             -ELOS/Goals: 14-18, PT/OT/SLP min A to sup - 9/4 estimated discharge, family pursuing SNF due to increased caregiver burden and patient's wife and daughter both currently hospitalized             -Order PRAFO/WHO -utilize nightly             -Continue CIR  - 8/15 cognitive status mildly reduced from yesterday, is still within normal limits from admission given documentation but given CT head 8-12 with  questionable IPH, recommended serial repeat, will repeat CT head without contrast today  -8-16: CT head without significant changes, doing better cognitively today with encouragement, monitor  - 8/ 20: Cognition and participation waxes and wanes depending on day and time of day.  Overall, participates well but tends to "push"/flexor withdrawal with weightbearing on right leg which limits standing tolerance. -8-27: Does very well intermittently with therapies, complicated by severe and hemineglect.  Wife is in the hospital with renal infection, family discussing supports.  No current change  in dispo plan.   2.  Antithrombotics: -DVT/anticoagulation:  Pharmaceutical: Xarelto 20mg  daily w/breakfast?             -antiplatelet therapy: N/A  3. Pain Management/right sided spasticity:  Tylenol. Local measures.  --Per chart review was on tramadol? Naltrexone? Lidocaine patches?    -8-15: Increase spasticity right lower extremity, add baclofen 10 mg 3 times daily -8-16: Pain in right lower extremity, spasticity versus neuropathic, start gabapentin 100 mg 3 times daily.  Check renal function on Monday 8/19: BL LE pain, aching today; increase tramadol to 50 mg BID; cannot increase baclofen/gabapentin d/t aki 8/20: Using PRN tylenol frequently; schedule tylenol 1000 mg TID, switch tramadol to 50 mg Q6H PRN 8-21: Cognition is improved and pain seems fairly well-controlled, monitor 8-27: DC tramadol, not utilizing -8-28: Ongoing issues with spasticity and knee, however would avoid baclofen increase due to CKD.  Could consider Botox as outpatient. 8/29: Make baclofen PRN d/t AKI -pending lab work 8-30 may resume at lower dose due to increased knee and plantarflexion tone  4. Mood/Behavior/Sleep: LCSW to follow for evaluation and support.              -antipsychotic agents: N/A  -Trazodone PRN -not using, discontinue  5. Neuropsych/cognition: This patient is not capable of making decisions on his own  behalf.  6. Skin/Wound Care: Routine pressure relief measures  7. Fluids/Electrolytes/ Nutrition/Dysphagia: Monitor I/O. Check CMET in am             --assist with meals  -8-15: Labs improved, discontinue IV fluid 50 cc/h; check Monday eating 75-100% meals per report  -therapies to work on encouraging p.o. fluids   -8/26 IVF 500 ccs today; encourage POs 8/29: IVF 75 cc/hr -awaiting repeat  labs 8-30 to DC  8. HTN: Home meds Amlodipine 5mg , Hydralazine 25mg  daily,  goal SBP < 180, long term goal normotensive -Monitor BP TID continue amlodipine - stable -8-15: Has been mostly normotensive, will reduce amlodipine back to 5 mg as perfusion difficulty may be contributing to cognitive difficulties at this time. -8-16: Remains hypotensive today, will DC amlodipine; no current medications -8/17-18/24 BPs improving, monitor 8/19: Orthostatic; IVF, TEDs, Binder, currently not on PO BP medications 8-22: Much better with improved water intake -stable -stable; normotensive, monitor   Vitals:   11/08/22 1552 11/08/22 1957 11/09/22 0428 11/09/22 1501  BP: 121/85 123/89 (!) 115/98 116/89   11/09/22 2016 11/10/22 0547 11/10/22 1339 11/10/22 2031  BP: (!) 121/95 (!) 117/92 (!) 123/90 104/72   11/11/22 0524 11/11/22 1432 11/11/22 2103 11/12/22 0529  BP: 117/87 135/83 120/88 (!) 132/109     9. A fib: Monitor HR TID. On xarelto. Heart rate controlled  - rate controlled, monitor  10.  Thrombocytopenia: Recheck platelets in am. Monitor for signs of bleeding.-- plt 156 on 10/28/22  11. Dysphagia: On D2,thins. Encourage fluid intake   - see above  12. Acute on chronic renal failure:  SCr 1.32 at admission. Appears baseline 1.3-1.4 --Continue to monitor and encourage intake  - uptrending on last labs 8/12, encourage fluids, BMP in AM -stable, reassess q. Monday - 8/19: AKI Cr 1.6 again; IVF today 75 cc/hr - 8/20: 1.4 today, DC IVF, POs excellent -8-22: Creatinine 1.3, improved.  Stable on p.o.  regimen. - 8/26: Cr increase, BUN downtrending; 500 cc IVF today, recheck in AM 8-27 -creatinine back to baseline, BUN unchanged; encourage p.o. fluids, monitor 8/29: AKI; labs appear volume depletion/pre-renal; encourage PO fluids and IVF today, BMP in  AM 8-30: BMP pending  13. Hyponatremia: Na dropped to 132 in 48 hours. Recheck in am   - 8-15: Stable 132-135, monitor   -8/19: 136; 8-29 back to 134  14.  H/o CAD s/p PCI: Monitor for symptoms with increase in activity.             --continue Crestor 40mg  daily  15.  Situational depression: Lexapro 10mg  added on acute.    - Also benefit for motor recovery of UE; would not adjust  -8-27: Increase Lexapro for processing speed, motor recovery.  DC trazodone, tramadol for serotoninergic risks  8-28: Cognition does seem somewhat better today, will monitor for trend  8/29: Monitor BMPs given hyponatremia, however likely d/t dehydration, Tx as above  16. Hx of multiple cancers: R-RCC s/p nephrectomy 2015.  -Right adrenal pheochromocytoma  s/p right adrenalectomy 2017.  -Prostate CA s/p IMRT 2018.  Continue flomax -RUL nodule being monitored.  17. H/o Emphysema?: Noted on CT chest on care everywhere.    - stable  18. HLD. Continue statin as above  19. Substance abuse. Encourage cessation  20.  Urinary retention/Prostate CA s/p IMRT 2018.  Continue flomax.   -10/30/22 Remains incontinent but low PVRs, encourage up to bedside and monitor-- could claritin be interfering? -10/31/22 needed caths overnight; defer adjustments to weekday team 8/19: Add bethenacol 10 mg TID for retention 8/20: Increase to 25 mg TID 8-21: Increase bethanechol to 25 mg 3 times daily, and obtain renal ultrasound to ensure no obstruction.  If no improvement in 24 hours, will replace Foley. 8-22: Remains completely incontinent, renal ultrasound shows mildly thickened and trabeculated bladder but no gross obstruction.  Replace Foley, give patient urinary rest over the  weekend and DC bethanechol due to potential side effects. 8/27: Appreciate urology evaluation and recommendation, maintain Foley with replacement every 30 days, outpatient follow-up for urodynamic studies  20. Constipation: -10/30/22 reported BM 2 days ago but looks like he had two yesterday but none for 2 days prior. Will schedule senokot 1 tab daily and add miralax daily PRN for now-- monitor, if oversoftened/too frequent with senokot then may just switch back to PRN but pt will have difficult time asking for PRNs -10/31/22 LBM yesterday, however has been smears since 8/17; increase bowel regimen to sennakpot-s 2 tabs BID + miralax BID -11/08/22 LBM; smear 8/27 -sorbitol once this evening 8-28  - Last BM: Large BM 8/30, incontinent  LOS: 17 days A FACE TO FACE EVALUATION WAS PERFORMED  Angelina Sheriff 11/12/2022, 11:55 AM

## 2022-11-12 NOTE — Progress Notes (Signed)
Patient ID: Frank Barron, male   DOB: March 23, 1950, 72 y.o.   MRN: [id number]  SW spoke with pt dtr [name] to discuss SNF preference. Reports she is going to see if a family member is able to go visit two facilities- [facility] and [facility]. SW will follow-up next week to discuss status.   Cecile Sheerer, MSW, LCSWA Office: [phone] Cell: [phone] Fax: [phone]

## 2022-11-12 NOTE — Progress Notes (Signed)
Resident refused am and pm meds.

## 2022-11-12 NOTE — Progress Notes (Signed)
Speech Language Pathology Daily Session Note  Patient Details  Name: Frank Barron MRN: [id number] Date of Birth: 04/13/1950  Today's Date: 11/12/2022 SLP Individual Time: 8756-4332 SLP Individual Time Calculation (min): 58 min  Short Term Goals: Week 3: SLP Short Term Goal 1 (Week 3): STGs=LTGs 2* ELOS  Skilled Therapeutic Interventions: SLP conducted skilled therapy session targeting dysphagia management and communication goals. This session, SLP began trials with high-tech AAC device utilizing tablet with [employer] software.  Patient receptive to AAC trials and demonstrates ability to click items when verbally presented with prompt given minA. Utilizing AAC device, patient requested desire for snack and drink item given field of 4-7 choices and with modI. SLP programmed device to contain specific environmental items with patient exhibiting improved success of communication with added familiar images. SLP will continue trials with device to determine compatibility and potential AAC needs as patient approaches discharge. During dysphagia trials, SLP observed patient with regular solids and thin liquids from cup, noting no overt s/sx of penetration/aspiration across trials. Recommend continuation of current diet with patient now appropriate for intermittent supervision vs. Full. Continue with NO STRAWS strategy. During continued communication tasks, patient selected answers to orientation questions from fields of 3-4. Patient required minA to orient to month and date but was oriented to year and discharge day utilizing room signs independently. During writing trials, patient wrote name independently after verbal prompt was presented but required copy strategy to accurately spell SLP's name. SLP provided verbal prompts for letters with patient writing letters with 100% accuracy when given modA. 6/10 letters were written accurately independently. Patient was left in lowered bed with call bell in reach  and bed alarm set. SLP will continue to target goals per plan of care.      Pain Pain Assessment Pain Scale: 0-10 Pain Score: 0-No pain  Therapy/Group: Individual Therapy  Jeannie Done, M.A., CCC-SLP  [name] 11/12/2022, 9:01 AM

## 2022-11-12 NOTE — Progress Notes (Signed)
Occupational Therapy Weekly Progress Note  Patient Details  Name: Genevieve Vizzini MRN: [id number] Date of Birth: Nov 25, 1950  Beginning of progress report period: October 27, 2022 End of progress report period: November 12, 2022  Today's Date: 11/12/2022 OT Individual Time: 1347-1500 OT Individual Time Calculation (min): 73 min    Patient has met 2 of 3 short term goals.  Patient is making slow but steady progress this week. Pt has made great progress using the slideboard for transfers this week. He is improving with head/hips relationship and initiating the transfer. +2 needed for toileting and toilet transfers although pt has foley and has been incontinent of BM. Continue current POC.  Patient continues to demonstrate the following deficits: muscle weakness, decreased cardiorespiratoy endurance, impaired timing and sequencing, abnormal tone, unbalanced muscle activation, motor apraxia, ataxia, decreased coordination, and decreased motor planning, decreased midline orientation, decreased attention to right, right side neglect, and decreased motor planning, decreased initiation, decreased attention, decreased awareness, decreased problem solving, decreased safety awareness, decreased memory, and delayed processing, and decreased sitting balance, decreased standing balance, decreased postural control, hemiplegia, and decreased balance strategies and therefore will continue to benefit from skilled OT intervention to enhance overall performance with BADL and Reduce care partner burden.  Patient progressing toward long term goals..  Continue plan of care.  OT Short Term Goals Week 2:  OT Short Term Goal 1 (Week 2): Patient will complete toilet transfer with max A of 1 OT Short Term Goal 1 - Progress (Week 2): Progressing toward goal OT Short Term Goal 2 (Week 2): Patient will don shirt with mod A OT Short Term Goal 2 - Progress (Week 2): Met OT Short Term Goal 3 (Week 2): Pt will locate 2 items on L  side of the sink with mod ques OT Short Term Goal 3 - Progress (Week 2): Met Week 3:  OT Short Term Goal 1 (Week 3): LTG=STG 2.2 ELOS  Skilled Therapeutic Interventions/Progress Updates:    Pt greeted seated in [name] and agreeable to OT treatment session. Pt said 'yes" to showering today. Stedy used for transfer onto wide BSC in shower. Pt needed +2 assist with pushing in [place] and difficulty achieving full upright posture. Pt incontinent of BM as transferring up in [place]. No continent BM as we got to the [facility] in shower. Total A to wash peri-area and buttocks using leaning and cut out in commode, but more cleaning had to be done once transferred back to bed. Pt initiated washing upper body and chest. Max+2 to transfer out of bathroom back onto Joes. UB dressing completed at EOB, then pt returned to bed for LB dressing. Mod A to roll to the R while OT cleaned up the rest of the loose BM from shower. Clean brief donned. OT provided gentle stretching and ROM to R UE for pain and tone management. Pt placed in resting hand splint. Pt left semi-reclined in bed with bed alarm on, call bell in reach, and needs met.   Therapy Documentation Precautions:  Precautions Precautions: Fall Precaution Comments: R hemiplegia, aphasia (inconsistent yes/no) Restrictions Weight Bearing Restrictions: No Pain:  Pt winces in pain with sit<>stands in [place], unclear where he is experiencing pain. Rest and repositioned for comfot   Therapy/Group: Individual Therapy  [name] 11/12/2022, 2:54 PM

## 2022-11-12 NOTE — Progress Notes (Signed)
Physical Therapy Session Note  Patient Details  Name: Frank Barron MRN: [id number] Date of Birth: 06-26-1950  Today's Date: 11/12/2022 PT Individual Time: 1104-1202 PT Individual Time Calculation (min): 58 min   Short Term Goals: Week 3:  PT Short Term Goal 1 (Week 3): STG = LTG due to ELOS  Skilled Therapeutic Interventions/Progress Updates:    Pt presents in room in bed, agreeable to PT. Pt inconsistent with report of pain, unable to indicate any localized pain but still motivated to participate. Session focused on training for bed mobility, functional transfers, standing tolerance and NMR for  postural stability in standing to decrease R lateropulsion.  Pt completes supine to sit with max elevated HOB, min assist for RLE mgmt, increased time completed due to pt demonstrating some discomfort however pt continues with mobility despite therapist offering rest break, improved presentation once seated EOB. Pt then completes slideboard transfer mod assist of 1 person to L, good initiation noted however pt demonstrates decreased gluteal clearance for transfer, cues for foot placement and positioning throughout.  Pt self propels WC with LLE from room to day room ~150' mod assist for initiating and maintaining momentum as well as correcting for R path deviation.  Pt positioned in front of standing frame, increased time for set up, and completes standing 9 min and 7 min with NMR provided during that time to promote improved postural alignment and decreased R lateropulsion. Pt completes weightshifting with visual cues to bring L shoudler to therapist shoulder, manual facilitation of weight shifting at pt's hips x1 min, placing L elbow flexed on table with AAROM for R elbow ROM x30 reps, x15 mini squats, reaching for x10 cones outside BOS. Pt requires extended seated rest break between standing trials due to fatigue.  Pt self propels with LLE back to room with mod assist, increased assist needed for L  turns, demonstrates improved ability to turn while in room for positioning next to bed. Pt remains seated in WC with all needs within reach, cal light in place and chair alarm donned and activated at end of session.   Therapy Documentation Precautions:  Precautions Precautions: Fall Precaution Comments: R hemiplegia, aphasia (inconsistent yes/no) Restrictions Weight Bearing Restrictions: No  Therapy/Group: Individual Therapy  Edwin Cap PT, DPT 11/12/2022, 12:03 PM

## 2022-11-13 NOTE — Plan of Care (Signed)
  Problem: Consults Goal: RH STROKE PATIENT EDUCATION Description: See Patient Education module for education specifics  Outcome: Progressing   Problem: RH BOWEL ELIMINATION Goal: RH STG MANAGE BOWEL WITH ASSISTANCE Description: STG Manage Bowel with mod I Assistance. Outcome: Progressing Goal: RH STG MANAGE BOWEL W/MEDICATION W/ASSISTANCE Description: STG Manage Bowel with Medication with mod I Assistance. Outcome: Progressing   Problem: RH BLADDER ELIMINATION Goal: RH STG MANAGE BLADDER WITH ASSISTANCE Description: STG Manage Bladder With mod I Assistance Outcome: Progressing   Problem: RH SAFETY Goal: RH STG ADHERE TO SAFETY PRECAUTIONS W/ASSISTANCE/DEVICE Description: STG Adhere to Safety Precautions With cues Assistance/Device. Outcome: Progressing   Problem: RH KNOWLEDGE DEFICIT Goal: RH STG INCREASE KNOWLEDGE OF DIABETES Description:  Patient and family will be able to manage DM with medication and dietary modification using educational resources independently Outcome: Progressing Goal: RH STG INCREASE KNOWLEDGE OF HYPERTENSION Description: Patient and family will be able to manage HTN with medication and dietary modification using educational resources independently Outcome: Progressing Goal: RH STG INCREASE KNOWLEDGE OF DYSPHAGIA/FLUID INTAKE Description: Patient and family will be able to manage Dysphagia;medication and dietary modification using educational resources independently Outcome: Progressing Goal: RH STG INCREASE KNOWLEGDE OF HYPERLIPIDEMIA Description: Patient and family will be able to manage HLD with medication and dietary modification using educational resources independently Outcome: Progressing Goal: RH STG INCREASE KNOWLEDGE OF STROKE PROPHYLAXIS Description: Patient and family will be able to manage secondary risks with medication and dietary modification using educational resources independently Outcome: Progressing

## 2022-11-13 NOTE — Progress Notes (Signed)
Made several attempts to administer medications. Patient only agreed to take scheduled eliquis, all other medication he refused. Patient refused multiple times and also took his hand and demonstrated he did not want medication when saying no. Notified Dr. Shearon Stalls.   Frank Dome, LPN

## 2022-11-13 NOTE — Progress Notes (Signed)
PROGRESS NOTE   Subjective/Complaints:  Pt doing well, speech more consistent than prior weekends. Slept well. Denies pain (questionable historian on this). LBM yesterday per documentation. Foley in place. Denies any complaints or concerns but questionable historian.   ROS: Difficult to ascertain due to aphasia. See HPI above  Objective:   No results found. Recent Labs    11/11/22 0645  WBC 3.8*  HGB 12.2*  HCT 35.8*  PLT 203     Recent Labs    11/11/22 0645 11/12/22 1249  NA 134* 137  K 4.4 3.7  CL 106 105  CO2 23 22  GLUCOSE 87 100*  BUN 23 18  CREATININE 1.96* 1.33*  CALCIUM 8.8* 8.8*      Intake/Output Summary (Last 24 hours) at 11/13/2022 1147 Last data filed at 11/13/2022 0900 Gross per 24 hour  Intake 832 ml  Output 1675 ml  Net -843 ml        Physical Exam: Vital Signs Blood pressure (!) 124/103, pulse 62, temperature 98.4 F (36.9 C), resp. rate 18, height 6\' 3"  (1.905 m), weight 98.9 kg, SpO2 98%.  General: No apparent distress.  Sitting up in bed eating breakfast. HEENT: mild left exotropia, pupils equal and round, unchanged Heart: reg rate, irregular rhythm. No murmurs rubs or gallops Chest: CTA bilaterally without wheezes, rales, or rhonchi; no distress Abdomen: Soft, non-tender, non-distended, bowel sounds positive and normoactive. + Foley--not assessed today Extremities: No clubbing, cyanosis, or edema.  Bilateral TED hose not in place today Psych: Flat affect, difficult d/t aphasia but seems improved from prior weekends.  Does have increasing smiling, interactiveness. Says yes/no more consistently/clearly,  Skin: Clean and intact without signs of breakdown over exposed surfaces  PRIOR EXAMS: MsK: no apparent deformities or tenderness.  Right shoulder taped, no palpable subluxation.  Neuro: AAO to place only, not self or time with options today. + expressive greater than receptive  aphasia - improving, remains severe + right hemineglect -severe CN: + right facial droop, decreased shoulder shrug on the right -unchanged  Strength 5 out of 5 in left upper extremity left lower extremity, with exception of left hip flexion 3-/5 due to pain Strength 0 out of 5 in right upper extremity and right lower extremity, complicated by neglect -can occasionally mobilize right upper extremity on exam at wrist and fingers   sensation to light touch intact in right upper extremity and right lower extremity per patient.  Sensation to pain intact.  MAS 1 R elbow flexors, shoulder adductors, MCP and PIP flexors MAS 2 R hamstrings and MAS 2 right plantar flexors  -mildly increased since reducing baclofen .  Assessment/Plan: 1. Functional deficits which require 3+ hours per day of interdisciplinary therapy in a comprehensive inpatient rehab setting. Physiatrist is providing close team supervision and 24 hour management of active medical problems listed below. Physiatrist and rehab team continue to assess barriers to discharge/monitor patient progress toward functional and medical goals  Care Tool:  Bathing    Body parts bathed by patient: Chest, Abdomen, Right upper leg, Left upper leg, Face, Right arm   Body parts bathed by helper: Right lower leg, Left lower leg, Buttocks, Front perineal area,  Left arm     Bathing assist Assist Level: Maximal Assistance - Patient 24 - 49%     Upper Body Dressing/Undressing Upper body dressing   What is the patient wearing?: Pull over shirt    Upper body assist Assist Level: Moderate Assistance - Patient 50 - 74%    Lower Body Dressing/Undressing Lower body dressing      What is the patient wearing?: Pants     Lower body assist Assist for lower body dressing: Total Assistance - Patient < 25%     Toileting Toileting    Toileting assist Assist for toileting: 2 Helpers     Transfers Chair/bed transfer  Transfers assist  Chair/bed  transfer activity did not occur: Safety/medical concerns  Chair/bed transfer assist level: Minimal Assistance - Patient > 75%     Locomotion Ambulation   Ambulation assist   Ambulation activity did not occur: Safety/medical concerns  Assist level: 2 helpers Assistive device: Lite Gait Max distance: 50'   Walk 10 feet activity   Assist  Walk 10 feet activity did not occur: Safety/medical concerns  Assist level: 2 helpers Assistive device: Lite Gait   Walk 50 feet activity   Assist Walk 50 feet with 2 turns activity did not occur: Safety/medical concerns  Assist level: 2 helpers Assistive device: Lite Gait    Walk 150 feet activity   Assist Walk 150 feet activity did not occur: Safety/medical concerns         Walk 10 feet on uneven surface  activity   Assist Walk 10 feet on uneven surfaces activity did not occur: Safety/medical concerns         Wheelchair     Assist Is the patient using a wheelchair?: Yes Type of Wheelchair: Manual    Wheelchair assist level: Moderate Assistance - Patient 50 - 74% Max wheelchair distance: 150'    Wheelchair 50 feet with 2 turns activity    Assist        Assist Level: Moderate Assistance - Patient 50 - 74%   Wheelchair 150 feet activity     Assist      Assist Level: Moderate Assistance - Patient 50 - 74%   Blood pressure (!) 124/103, pulse 62, temperature 98.4 F (36.9 C), resp. rate 18, height 6\' 3"  (1.905 m), weight 98.9 kg, SpO2 98%.  Medical Problem List and Plan: 1. 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 Pt has hx of prior CVA with residual aphasia.              -patient may shower             -ELOS/Goals: 14-18, PT/OT/SLP min A to sup - 9/4 estimated discharge, family pursuing SNF due to increased caregiver burden and patient's wife and daughter both currently hospitalized             -Order PRAFO/WHO  -utilize nightly             -Continue CIR  - 8/15 cognitive status mildly reduced from yesterday, is still within normal limits from admission given documentation but given CT head 8-12 with questionable IPH, recommended serial repeat, will repeat CT head without contrast today  -8-16: CT head without significant changes, doing better cognitively today with encouragement, monitor  - 8/ 20: Cognition and participation waxes and wanes depending on day and time of day.  Overall, participates well but tends to "push"/flexor withdrawal with weightbearing on right leg  which limits standing tolerance. -8-27: Does very well intermittently with therapies, complicated by severe and hemineglect.  Wife is in the hospital with renal infection, family discussing supports.  No current change in dispo plan.   2.  Antithrombotics: -DVT/anticoagulation:  Pharmaceutical: Xarelto 20mg  daily w/breakfast?>>now on eliquis 5mg  BID             -antiplatelet therapy: N/A  3. Pain Management/right sided spasticity:  Tylenol. Local measures.  --Per chart review was on tramadol? Naltrexone? Lidocaine patches?    -8-15: Increase spasticity right lower extremity, add baclofen 10 mg 3 times daily -8-16: Pain in right lower extremity, spasticity versus neuropathic, start gabapentin 100 mg 3 times daily.  Check renal function on Monday 8/19: BL LE pain, aching today; increase tramadol to 50 mg BID; cannot increase baclofen/gabapentin d/t aki 8/20: Using PRN tylenol frequently; schedule tylenol 1000 mg TID, switch tramadol to 50 mg Q6H PRN 8-21: Cognition is improved and pain seems fairly well-controlled, monitor 8-27: DC tramadol, not utilizing -8-28: Ongoing issues with spasticity and knee, however would avoid baclofen increase due to CKD.  Could consider Botox as outpatient. 8/29: Make baclofen PRN d/t AKI -pending lab work 8-30 may resume at lower dose due to increased knee and plantarflexion tone  4. Mood/Behavior/Sleep:  LCSW to follow for evaluation and support. Lexapro 20mg  daily             -antipsychotic agents: N/A  -Trazodone PRN -not using, discontinue  5. Neuropsych/cognition: This patient is not capable of making decisions on his own behalf.  6. Skin/Wound Care: Routine pressure relief measures  7. Fluids/Electrolytes/ Nutrition/Dysphagia: Monitor I/O. Check CMET in am             --assist with meals  -8-15: Labs improved, discontinue IV fluid 50 cc/h; check Monday eating 75-100% meals per report  -therapies to work on encouraging p.o. fluids   -8/26 IVF 500 ccs today; encourage POs 8/29: IVF 75 cc/hr -awaiting repeat  labs 8-30 to DC--d/c'd 11/12/22  8. HTN: Home meds Amlodipine 5mg , Hydralazine 25mg  daily,  goal SBP < 180, long term goal normotensive -Monitor BP TID continue amlodipine - stable -8-15: Has been mostly normotensive, will reduce amlodipine back to 5 mg as perfusion difficulty may be contributing to cognitive difficulties at this time. -8-16: Remains hypotensive today, will DC amlodipine; no current medications -8/17-18/24 BPs improving, monitor 8/19: Orthostatic; IVF, TEDs, Binder, currently not on PO BP medications 8-22: Much better with improved water intake -stable -11/13/22 stable; normotensive, monitor   Vitals:   11/09/22 1501 11/09/22 2016 11/10/22 0547 11/10/22 1339  BP: 116/89 (!) 121/95 (!) 117/92 (!) 123/90   11/10/22 2031 11/11/22 0524 11/11/22 1432 11/11/22 2103  BP: 104/72 117/87 135/83 120/88   11/12/22 0529 11/12/22 1256 11/12/22 2031 11/13/22 0431  BP: (!) 132/109 115/82 (!) 135/91 (!) 124/103     9. A fib: Monitor HR TID. On xarelto. Heart rate controlled  - rate controlled, monitor  10.  Thrombocytopenia: Recheck platelets in am. Monitor for signs of bleeding.-- plt 156 on 10/28/22  11. Dysphagia: On D2,thins. Encourage fluid intake   - see above  12. Acute on chronic renal failure:  SCr 1.32 at admission. Appears baseline 1.3-1.4 --Continue to  monitor and encourage intake  - uptrending on last labs 8/12, encourage fluids, BMP in AM -stable, reassess q. Monday - 8/19: AKI Cr 1.6 again; IVF today 75 cc/hr - 8/20: 1.4 today, DC IVF, POs excellent -8-22: Creatinine 1.3, improved.  Stable  on p.o. regimen. - 8/26: Cr increase, BUN downtrending; 500 cc IVF today, recheck in AM 8-27 -creatinine back to baseline, BUN unchanged; encourage p.o. fluids, monitor 8/29: AKI; labs appear volume depletion/pre-renal; encourage PO fluids and IVF today, BMP in AM 8-30: BMP with Cr back down to baseline 1.33, BUN 18, d/c'd fluids  13. Hyponatremia: Na dropped to 132 in 48 hours. Recheck in am   - 8-15: Stable 132-135, monitor   -8/19: 136; 8-29 back to 134  14.  H/o CAD s/p PCI: Monitor for symptoms with increase in activity.             --continue Crestor 40mg  daily  15.  Situational depression: Lexapro 10mg  added on acute.    - Also benefit for motor recovery of UE; would not adjust  -8-27: Increase Lexapro for processing speed, motor recovery.  DC trazodone, tramadol for serotoninergic risks  8-28: Cognition does seem somewhat better today, will monitor for trend  8/29: Monitor BMPs given hyponatremia, however likely d/t dehydration, Tx as above  16. Hx of multiple cancers: R-RCC s/p nephrectomy 2015.  -Right adrenal pheochromocytoma  s/p right adrenalectomy 2017.  -Prostate CA s/p IMRT 2018.  Continue flomax -RUL nodule being monitored.  17. H/o Emphysema?: Noted on CT chest on care everywhere.    - stable  18. HLD. Continue statin as above  19. Substance abuse. Encourage cessation  20.  Urinary retention/Prostate CA s/p IMRT 2018.  Continue flomax.   -10/30/22 Remains incontinent but low PVRs, encourage up to bedside and monitor-- could claritin be interfering? -10/31/22 needed caths overnight; defer adjustments to weekday team 8/19: Add bethenacol 10 mg TID for retention 8/20: Increase to 25 mg TID 8-21: Increase bethanechol to 25  mg 3 times daily, and obtain renal ultrasound to ensure no obstruction.  If no improvement in 24 hours, will replace Foley. 8-22: Remains completely incontinent, renal ultrasound shows mildly thickened and trabeculated bladder but no gross obstruction.  Replace Foley, give patient urinary rest over the weekend and DC bethanechol due to potential side effects. 8/27: Appreciate urology evaluation and recommendation, maintain Foley with replacement every 30 days, outpatient follow-up for urodynamic studies  20. Constipation: -10/30/22 reported BM 2 days ago but looks like he had two yesterday but none for 2 days prior. Will schedule senokot 1 tab daily and add miralax daily PRN for now-- monitor, if oversoftened/too frequent with senokot then may just switch back to PRN but pt will have difficult time asking for PRNs -10/31/22 LBM yesterday, however has been smears since 8/17; increase bowel regimen to sennakot-s 2 tabs BID + miralax BID -11/08/22 LBM; smear 8/27 -sorbitol once this evening 8-28  - Last BM: Large BM 8/30, incontinent  LOS: 18 days A FACE TO FACE EVALUATION WAS PERFORMED  [address] 11/13/2022, 11:47 AM

## 2022-11-13 NOTE — Plan of Care (Signed)
  Problem: RH BOWEL ELIMINATION Goal: RH STG MANAGE BOWEL WITH ASSISTANCE Description: STG Manage Bowel with mod I Assistance. Outcome: Progressing   Problem: RH BLADDER ELIMINATION Goal: RH STG MANAGE BLADDER WITH ASSISTANCE Description: STG Manage Bladder With mod I Assistance Outcome: Progressing   Problem: RH SAFETY Goal: RH STG ADHERE TO SAFETY PRECAUTIONS W/ASSISTANCE/DEVICE Description: STG Adhere to Safety Precautions With cues Assistance/Device. Outcome: Progressing

## 2022-11-14 MED ORDER — CHLORHEXIDINE GLUCONATE CLOTH 2 % EX PADS: 6.0000 | MEDICATED_PAD | Freq: Every day | CUTANEOUS | Status: DC

## 2022-11-14 MED ORDER — CHLORHEXIDINE GLUCONATE CLOTH 2 % EX PADS
6.0000 | MEDICATED_PAD | Freq: Two times a day (BID) | CUTANEOUS | Status: DC
  Administered 2022-11-14 – 2022-11-18 (×7): 6 via TOPICAL

## 2022-11-14 NOTE — Progress Notes (Signed)
PROGRESS NOTE   Subjective/Complaints:  Pt doing well again today. Slept ok. Denies pain (questionable historian on this). LBM today. Foley in place. Denies any complaints or concerns but questionable historian. Apparently refused meds yesterday and the day before, but has accepted them today.   ROS: Difficult to ascertain due to aphasia. See HPI above  Objective:   No results found. No results for input(s): "WBC", "HGB", "HCT", "PLT" in the last 72 hours.    Recent Labs    11/12/22 1249  NA 137  K 3.7  CL 105  CO2 22  GLUCOSE 100*  BUN 18  CREATININE 1.33*  CALCIUM 8.8*      Intake/Output Summary (Last 24 hours) at 11/14/2022 1114 Last data filed at 11/14/2022 1042 Gross per 24 hour  Intake 720 ml  Output 1500 ml  Net -780 ml        Physical Exam: Vital Signs Blood pressure (!) 126/92, pulse 64, temperature 98.6 F (37 C), resp. rate 18, height 6\' 3"  (1.905 m), weight 98.9 kg, SpO2 99%.  General: No apparent distress.  Sitting up in bed. HEENT: mild left exotropia, pupils equal and round, unchanged Heart: reg rate, irregular rhythm. No murmurs rubs or gallops Chest: CTA bilaterally without wheezes, rales, or rhonchi; no distress Abdomen: Soft, non-tender, non-distended, bowel sounds positive and normoactive. + Foley--not assessed today Extremities: No clubbing, cyanosis, or edema.  Bilateral TED hose not in place today Psych: Flat affect, difficult d/t aphasia but seems improved from prior weekends.  Does have increasing smiling, interactiveness. Says yes/no more consistently/clearly,  Skin: Clean and intact without signs of breakdown over exposed surfaces  PRIOR EXAMS: MsK: no apparent deformities or tenderness.  Right shoulder taped, no palpable subluxation.  Neuro: AAO to place only, not self or time with options today. + expressive greater than receptive aphasia - improving, remains severe + right  hemineglect -severe CN: + right facial droop, decreased shoulder shrug on the right -unchanged  Strength 5 out of 5 in left upper extremity left lower extremity, with exception of left hip flexion 3-/5 due to pain Strength 0 out of 5 in right upper extremity and right lower extremity, complicated by neglect -can occasionally mobilize right upper extremity on exam at wrist and fingers   sensation to light touch intact in right upper extremity and right lower extremity per patient.  Sensation to pain intact.  MAS 1 R elbow flexors, shoulder adductors, MCP and PIP flexors MAS 2 R hamstrings and MAS 2 right plantar flexors  -mildly increased since reducing baclofen .  Assessment/Plan: 1. Functional deficits which require 3+ hours per day of interdisciplinary therapy in a comprehensive inpatient rehab setting. Physiatrist is providing close team supervision and 24 hour management of active medical problems listed below. Physiatrist and rehab team continue to assess barriers to discharge/monitor patient progress toward functional and medical goals  Care Tool:  Bathing    Body parts bathed by patient: Chest, Abdomen, Right upper leg, Left upper leg, Face, Right arm   Body parts bathed by helper: Right lower leg, Left lower leg, Buttocks, Front perineal area, Left arm     Bathing assist Assist Level: Maximal Assistance -  Patient 24 - 49%     Upper Body Dressing/Undressing Upper body dressing   What is the patient wearing?: Pull over shirt    Upper body assist Assist Level: Moderate Assistance - Patient 50 - 74%    Lower Body Dressing/Undressing Lower body dressing      What is the patient wearing?: Pants     Lower body assist Assist for lower body dressing: Total Assistance - Patient < 25%     Toileting Toileting    Toileting assist Assist for toileting: 2 Helpers     Transfers Chair/bed transfer  Transfers assist  Chair/bed transfer activity did not occur:  Safety/medical concerns  Chair/bed transfer assist level: Minimal Assistance - Patient > 75%     Locomotion Ambulation   Ambulation assist   Ambulation activity did not occur: Safety/medical concerns  Assist level: 2 helpers Assistive device: Lite Gait Max distance: 50'   Walk 10 feet activity   Assist  Walk 10 feet activity did not occur: Safety/medical concerns  Assist level: 2 helpers Assistive device: Lite Gait   Walk 50 feet activity   Assist Walk 50 feet with 2 turns activity did not occur: Safety/medical concerns  Assist level: 2 helpers Assistive device: Lite Gait    Walk 150 feet activity   Assist Walk 150 feet activity did not occur: Safety/medical concerns         Walk 10 feet on uneven surface  activity   Assist Walk 10 feet on uneven surfaces activity did not occur: Safety/medical concerns         Wheelchair     Assist Is the patient using a wheelchair?: Yes Type of Wheelchair: Manual    Wheelchair assist level: Moderate Assistance - Patient 50 - 74% Max wheelchair distance: 150'    Wheelchair 50 feet with 2 turns activity    Assist        Assist Level: Moderate Assistance - Patient 50 - 74%   Wheelchair 150 feet activity     Assist      Assist Level: Moderate Assistance - Patient 50 - 74%   Blood pressure (!) 126/92, pulse 64, temperature 98.6 F (37 C), resp. rate 18, height 6\' 3"  (1.905 m), weight 98.9 kg, SpO2 99%.  Medical Problem List and Plan: 1. 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 Pt has hx of prior CVA with residual aphasia.              -patient may shower -ELOS/Goals: 14-18, PT/OT/SLP min A to sup - 9/4 estimated discharge, family pursuing SNF due to increased caregiver burden and patient's wife and daughter both currently hospitalized             -Order PRAFO/WHO -utilize nightly             -Continue CIR  -  8/15 cognitive status mildly reduced from yesterday, is still within normal limits from admission given documentation but given CT head 8-12 with questionable IPH, recommended serial repeat, will repeat CT head without contrast today  -8-16: CT head without significant changes, doing better cognitively today with encouragement, monitor  - 8/ 20: Cognition and participation waxes and wanes depending on day and time of day.  Overall, participates well but tends to "push"/flexor withdrawal with weightbearing on right leg which limits standing tolerance. -8-27: Does very well intermittently with therapies, complicated by severe and hemineglect.  Wife is in the hospital with renal infection,  family discussing supports.  No current change in dispo plan.   2.  Antithrombotics: -DVT/anticoagulation:  Pharmaceutical: Xarelto 20mg  daily w/breakfast?>>now on eliquis 5mg  BID             -antiplatelet therapy: N/A  3. Pain Management/right sided spasticity:  Tylenol. Local measures.  --Per chart review was on tramadol? Naltrexone? Lidocaine patches?    -8-15: Increase spasticity right lower extremity, add baclofen 10 mg 3 times daily -8-16: Pain in right lower extremity, spasticity versus neuropathic, start gabapentin 100 mg 3 times daily.  Check renal function on Monday 8/19: BL LE pain, aching today; increase tramadol to 50 mg BID; cannot increase baclofen/gabapentin d/t aki 8/20: Using PRN tylenol frequently; schedule tylenol 1000 mg TID, switch tramadol to 50 mg Q6H PRN 8-21: Cognition is improved and pain seems fairly well-controlled, monitor 8-27: DC tramadol, not utilizing -8-28: Ongoing issues with spasticity and knee, however would avoid baclofen increase due to CKD.  Could consider Botox as outpatient. 8/29: Make baclofen PRN d/t AKI -pending lab work 8-30 may resume at lower dose due to increased knee and plantarflexion tone  4. Mood/Behavior/Sleep: LCSW to follow for evaluation and support.  Lexapro 20mg  daily             -antipsychotic agents: N/A  -Trazodone PRN -not using, discontinue  5. Neuropsych/cognition: This patient is not capable of making decisions on his own behalf.  6. Skin/Wound Care: Routine pressure relief measures  7. Fluids/Electrolytes/ Nutrition/Dysphagia: Monitor I/O. Check CMET in am             --assist with meals  -8-15: Labs improved, discontinue IV fluid 50 cc/h; check Monday eating 75-100% meals per report  -therapies to work on encouraging p.o. fluids   -8/26 IVF 500 ccs today; encourage POs 8/29: IVF 75 cc/hr -awaiting repeat  labs 8-30 to DC--d/c'd 11/12/22  8. HTN: Home meds Amlodipine 5mg , Hydralazine 25mg  daily,  goal SBP < 180, long term goal normotensive -Monitor BP TID continue amlodipine - stable -8-15: Has been mostly normotensive, will reduce amlodipine back to 5 mg as perfusion difficulty may be contributing to cognitive difficulties at this time. -8-16: Remains hypotensive today, will DC amlodipine; no current medications -8/17-18/24 BPs improving, monitor 8/19: Orthostatic; IVF, TEDs, Binder, currently not on PO BP medications 8-22: Much better with improved water intake -stable -8/31-9/1 stable; normotensive, monitor   Vitals:   11/10/22 1339 11/10/22 2031 11/11/22 0524 11/11/22 1432  BP: (!) 123/90 104/72 117/87 135/83   11/11/22 2103 11/12/22 0529 11/12/22 1256 11/12/22 2031  BP: 120/88 (!) 132/109 115/82 (!) 135/91   11/13/22 0431 11/13/22 1423 11/13/22 2012 11/14/22 0514  BP: (!) 124/103 116/89 (!) 134/99 (!) 126/92     9. A fib: Monitor HR TID. On xarelto. Heart rate controlled  - rate controlled, monitor  10.  Thrombocytopenia: Recheck platelets in am. Monitor for signs of bleeding.-- plt 156 on 10/28/22  11. Dysphagia: On D2,thins. Encourage fluid intake   - see above  12. Acute on chronic renal failure:  SCr 1.32 at admission. Appears baseline 1.3-1.4 --Continue to monitor and encourage intake  - uptrending on  last labs 8/12, encourage fluids, BMP in AM -stable, reassess q. Monday - 8/19: AKI Cr 1.6 again; IVF today 75 cc/hr - 8/20: 1.4 today, DC IVF, POs excellent -8-22: Creatinine 1.3, improved.  Stable on p.o. regimen. - 8/26: Cr increase, BUN downtrending; 500 cc IVF today, recheck in AM 8-27 -creatinine back to baseline, BUN unchanged; encourage p.o.  fluids, monitor 8/29: AKI; labs appear volume depletion/pre-renal; encourage PO fluids and IVF today, BMP in AM 8-30: BMP with Cr back down to baseline 1.33, BUN 18, d/c'd fluids  13. Hyponatremia: Na dropped to 132 in 48 hours. Recheck in am   - 8-15: Stable 132-135, monitor   -8/19: 136; 8-29 back to 134  14.  H/o CAD s/p PCI: Monitor for symptoms with increase in activity.             --continue Crestor 40mg  daily  15.  Situational depression: Lexapro 10mg  added on acute.    - Also benefit for motor recovery of UE; would not adjust  -8-27: Increase Lexapro for processing speed, motor recovery.  DC trazodone, tramadol for serotoninergic risks  8-28: Cognition does seem somewhat better today, will monitor for trend  8/29: Monitor BMPs given hyponatremia, however likely d/t dehydration, Tx as above  16. Hx of multiple cancers: R-RCC s/p nephrectomy 2015.  -Right adrenal pheochromocytoma  s/p right adrenalectomy 2017.  -Prostate CA s/p IMRT 2018.  Continue flomax -RUL nodule being monitored.  17. H/o Emphysema?: Noted on CT chest on care everywhere.    - stable  18. HLD. Continue statin as above  19. Substance abuse. Encourage cessation  20.  Urinary retention/Prostate CA s/p IMRT 2018.  Continue flomax.   -10/30/22 Remains incontinent but low PVRs, encourage up to bedside and monitor-- could claritin be interfering? -10/31/22 needed caths overnight; defer adjustments to weekday team 8/19: Add bethenacol 10 mg TID for retention 8/20: Increase to 25 mg TID 8-21: Increase bethanechol to 25 mg 3 times daily, and obtain renal ultrasound to  ensure no obstruction.  If no improvement in 24 hours, will replace Foley. 8-22: Remains completely incontinent, renal ultrasound shows mildly thickened and trabeculated bladder but no gross obstruction.  Replace Foley, give patient urinary rest over the weekend and DC bethanechol due to potential side effects. 8/27: Appreciate urology evaluation and recommendation, maintain Foley with replacement every 30 days, outpatient follow-up for urodynamic studies  20. Constipation: -10/30/22 reported BM 2 days ago but looks like he had two yesterday but none for 2 days prior. Will schedule senokot 1 tab daily and add miralax daily PRN for now-- monitor, if oversoftened/too frequent with senokot then may just switch back to PRN but pt will have difficult time asking for PRNs -10/31/22 LBM yesterday, however has been smears since 8/17; increase bowel regimen to sennakot-s 2 tabs BID + miralax BID -11/08/22 LBM; smear 8/27 -sorbitol once this evening 8-28  - Last BM: 11/14/22  LOS: 19 days A FACE TO FACE EVALUATION WAS PERFORMED  [id number] 11/14/2022, 11:14 AM

## 2022-11-14 NOTE — Plan of Care (Signed)
  Problem: RH BOWEL ELIMINATION Goal: RH STG MANAGE BOWEL WITH ASSISTANCE Description: STG Manage Bowel with mod I Assistance. Outcome: Progressing Goal: RH STG MANAGE BOWEL W/MEDICATION W/ASSISTANCE Description: STG Manage Bowel with Medication with mod I Assistance. Outcome: Progressing   Problem: RH BLADDER ELIMINATION Goal: RH STG MANAGE BLADDER WITH ASSISTANCE Description: STG Manage Bladder With mod I Assistance Outcome: Progressing   Problem: RH SAFETY Goal: RH STG ADHERE TO SAFETY PRECAUTIONS W/ASSISTANCE/DEVICE Description: STG Adhere to Safety Precautions With cues Assistance/Device. Outcome: Progressing   Problem: RH KNOWLEDGE DEFICIT Goal: RH STG INCREASE KNOWLEDGE OF DIABETES Description:  Patient and family will be able to manage DM with medication and dietary modification using educational resources independently Outcome: Progressing Goal: RH STG INCREASE KNOWLEDGE OF HYPERTENSION Description: Patient and family will be able to manage HTN with medication and dietary modification using educational resources independently Outcome: Progressing Goal: RH STG INCREASE KNOWLEDGE OF DYSPHAGIA/FLUID INTAKE Description: Patient and family will be able to manage Dysphagia;medication and dietary modification using educational resources independently Outcome: Progressing Goal: RH STG INCREASE KNOWLEGDE OF HYPERLIPIDEMIA Description: Patient and family will be able to manage HLD with medication and dietary modification using educational resources independently Outcome: Progressing Goal: RH STG INCREASE KNOWLEDGE OF STROKE PROPHYLAXIS Description: Patient and family will be able to manage secondary risks with medication and dietary modification using educational resources independently Outcome: Progressing

## 2022-11-15 DIAGNOSIS — R339 Retention of urine, unspecified: Secondary | ICD-10-CM

## 2022-11-15 DIAGNOSIS — G8111 Spastic hemiplegia affecting right dominant side: Secondary | ICD-10-CM

## 2022-11-15 LAB — BASIC METABOLIC PANEL
Anion gap: 7 (ref 5–15)
BUN: 17 mg/dL (ref 8–23)
CO2: 23 mmol/L (ref 22–32)
Calcium: 9 mg/dL (ref 8.9–10.3)
Chloride: 105 mmol/L (ref 98–111)
Creatinine, Ser: 1.29 mg/dL — ABNORMAL HIGH (ref 0.61–1.24)
GFR, Estimated: 60 mL/min — ABNORMAL LOW (ref >60)
Glucose, Bld: 85 mg/dL (ref 70–99)
Potassium: 4.3 mmol/L (ref 3.5–5.1)
Sodium: 135 mmol/L (ref 135–145)

## 2022-11-15 LAB — CBC
HCT: 36.4 % — ABNORMAL LOW (ref 39.0–52.0)
Hemoglobin: 12.2 g/dL — ABNORMAL LOW (ref 13.0–17.0)
MCH: 29.9 pg (ref 26.0–34.0)
MCHC: 33.5 g/dL (ref 30.0–36.0)
MCV: 89.2 fL (ref 80.0–100.0)
Platelets: 176 10*3/uL (ref 150–400)
RBC: 4.08 MIL/uL — ABNORMAL LOW (ref 4.22–5.81)
RDW: 13.4 % (ref 11.5–15.5)
WBC: 3.9 10*3/uL — ABNORMAL LOW (ref 4.0–10.5)
nRBC: 0 % (ref 0.0–0.2)

## 2022-11-15 NOTE — Plan of Care (Signed)
  Problem: RH BOWEL ELIMINATION Goal: RH STG MANAGE BOWEL WITH ASSISTANCE Description: STG Manage Bowel with mod I Assistance. Outcome: Progressing Goal: RH STG MANAGE BOWEL W/MEDICATION W/ASSISTANCE Description: STG Manage Bowel with Medication with mod I Assistance. Outcome: Progressing   Problem: RH BLADDER ELIMINATION Goal: RH STG MANAGE BLADDER WITH ASSISTANCE Description: STG Manage Bladder With mod I Assistance Outcome: Progressing   Problem: RH KNOWLEDGE DEFICIT Goal: RH STG INCREASE KNOWLEDGE OF DIABETES Description:  Patient and family will be able to manage DM with medication and dietary modification using educational resources independently Outcome: Progressing Goal: RH STG INCREASE KNOWLEDGE OF HYPERTENSION Description: Patient and family will be able to manage HTN with medication and dietary modification using educational resources independently Outcome: Progressing Goal: RH STG INCREASE KNOWLEDGE OF DYSPHAGIA/FLUID INTAKE Description: Patient and family will be able to manage Dysphagia;medication and dietary modification using educational resources independently Outcome: Progressing Goal: RH STG INCREASE KNOWLEGDE OF HYPERLIPIDEMIA Description: Patient and family will be able to manage HLD with medication and dietary modification using educational resources independently Outcome: Progressing Goal: RH STG INCREASE KNOWLEDGE OF STROKE PROPHYLAXIS Description: Patient and family will be able to manage secondary risks with medication and dietary modification using educational resources independently Outcome: Progressing

## 2022-11-15 NOTE — Progress Notes (Addendum)
Speech Language Pathology Daily Session Note  Patient Details  Name: Frank Barron MRN: [id number] Date of Birth: 28-Jun-1950  Today's Date: 11/15/2022 SLP Individual Time: 1115-1200 SLP Individual Time Calculation (min): 45 min  Short Term Goals: Week 3: SLP Short Term Goal 1 (Week 3): STGs=LTGs 2* ELOS  Skilled Therapeutic Interventions: SLP conducted skilled therapy session targeting dysphagia management and communication goals. Patient exhibited slightly improved automatic verbalizations this date, stating 'no', 'yes', 'well', 'oh lord', and counting 1-5 during session. Patient unable to produce these items on demand. Utilizing fields of 4-6 written choices, patient identified preferred items to eat and drink (coke, goldfish). Across boluses, patient exhibited no overt s/sx of penetration/aspiration despite addition of small and large straw sips. Updated diet recommendations to allow for straws. Patient now appropriate to take medications whole with thin liquids vs. Applesauce. Continue regular/thin with intermittent supervision. During communication tasks, patient identified correct month utilizing selection from field of written choices and indicated date by holding up fingers. Patient wrote numbers as answers to numeric questions in 2/3 opportunities with modI requiring copy assist for remaining answer. Patient wrote full name with minA. During communication board therapy tasks, patient improving with identification of objects present on board but continued to require min-modA to locate pain scale and letter board. During yes/no questions, patient answered questions verbally with 20% accuracy. Patient was left in chair with call bell in reach and chair alarm set. SLP will continue to target goals per plan of care.      Pain Pain Assessment Pain Scale: 0-10 Pain Score: 0-No pain  Therapy/Group: Individual Therapy  Jeannie Done, M.A., CCC-SLP  [name] 11/15/2022, 12:18 PM

## 2022-11-15 NOTE — Progress Notes (Signed)
Occupational Therapy Session Note  Patient Details  Name: Frank Barron MRN: [id number] Date of Birth: 06-29-1950  Today's Date: 11/15/2022 OT Individual Time: 5621-3086 OT Individual Time Calculation (min): 41 min    Short Term Goals: Week 3:  OT Short Term Goal 1 (Week 3): LTG=STG 2.2 ELOS  Skilled Therapeutic Interventions/Progress Updates:    Pt greeted seated in wc and agreeable to OT treatment session. Pt's lunch tray arrived and OT addressed R visual scanning within self-feeding task. Pt able to locate items placed all the way on R side of tray. Min cues to check for pocketing, but overall improved timing and portion of bite sizes. L UE NMR with joint input through wrist and hand to help bring R UE through full ROM. Some increased tone at the wrist and elbow. Pt returned to bed with slideboard and mod A of 1. Max A to lift R LE back into bed. OT massaged and stretched R hand again to promote full wrist, hand and finger extension before placing R hand in resting hand splint. Pt left semi-reclined in bed with bed alarm on, call bell in reach, and needs met.   Therapy Documentation Precautions:  Precautions Precautions: Fall Precaution Comments: R hemiplegia, aphasia (inconsistent yes/no) Restrictions Weight Bearing Restrictions: No  Pain: Pain Assessment Pain Scale: 0-10 Pain Score: 0-No pain   Therapy/Group: Individual Therapy  [name] 11/15/2022, 12:35 PM

## 2022-11-15 NOTE — Progress Notes (Signed)
PROGRESS NOTE   Subjective/Complaints:  Pt up in chair. Therapy working with him. Indicates that he slept last night. Tends to answer yes but can correct himself.  ROS: limited d/t aphasia  Objective:   No results found. Recent Labs    11/15/22 0630  WBC 3.9*  HGB 12.2*  HCT 36.4*  PLT 176      Recent Labs    11/12/22 1249 11/15/22 0630  NA 137 135  K 3.7 4.3  CL 105 105  CO2 22 23  GLUCOSE 100* 85  BUN 18 17  CREATININE 1.33* 1.29*  CALCIUM 8.8* 9.0      Intake/Output Summary (Last 24 hours) at 11/15/2022 0958 Last data filed at 11/15/2022 0804 Gross per 24 hour  Intake 944 ml  Output 1950 ml  Net -1006 ml        Physical Exam: Vital Signs Blood pressure (!) 132/98, pulse 62, temperature 98.5 F (36.9 C), resp. rate 19, height 6\' 3"  (1.905 m), weight 98.9 kg, SpO2 96%.  Constitutional: No distress . Vital signs reviewed. HEENT: NCAT, EOMI, oral membranes moist Neck: supple Cardiovascular: RRR without murmur. No JVD    Respiratory/Chest: CTA Bilaterally without wheezes or rales. Normal effort    GI/Abdomen: BS +, non-tender, non-distended Ext: no clubbing, cyanosis, or edema Psych: pleasant and cooperative  Skin: Clean and intact without signs of breakdown over exposed surfaces Neuro: AAO to place only, not self or time with options today. + expressive greater than receptive aphasia - can correct with extra time + right hemineglect is still present CN: + right facial droop, decreased shoulder shrug on the right -unchanged  Strength 5 out of 5 in left upper extremity left lower extremity, with exception of left hip flexion 3-/5 due to pain Strength 0/5 RUE and RLE   sensation to light touch intact in right upper extremity and right lower extremity per patient.  Sensation to pain intact.  MAS 1 R elbow flexors, shoulder adductors, MCP and PIP flexors--easily ranged MAS 2 R hamstrings and MAS 2  right plantar flexors   -stable.  Assessment/Plan: 1. Functional deficits which require 3+ hours per day of interdisciplinary therapy in a comprehensive inpatient rehab setting. Physiatrist is providing close team supervision and 24 hour management of active medical problems listed below. Physiatrist and rehab team continue to assess barriers to discharge/monitor patient progress toward functional and medical goals  Care Tool:  Bathing    Body parts bathed by patient: Chest, Abdomen, Right upper leg, Left upper leg, Face, Right arm   Body parts bathed by helper: Right lower leg, Left lower leg, Buttocks, Front perineal area, Left arm     Bathing assist Assist Level: Maximal Assistance - Patient 24 - 49%     Upper Body Dressing/Undressing Upper body dressing   What is the patient wearing?: Pull over shirt    Upper body assist Assist Level: Moderate Assistance - Patient 50 - 74%    Lower Body Dressing/Undressing Lower body dressing      What is the patient wearing?: Pants     Lower body assist Assist for lower body dressing: Total Assistance - Patient < 25%  Toileting Toileting    Toileting assist Assist for toileting: 2 Helpers     Transfers Chair/bed transfer  Transfers assist  Chair/bed transfer activity did not occur: Safety/medical concerns  Chair/bed transfer assist level: Minimal Assistance - Patient > 75%     Locomotion Ambulation   Ambulation assist   Ambulation activity did not occur: Safety/medical concerns  Assist level: 2 helpers Assistive device: Lite Gait Max distance: 50'   Walk 10 feet activity   Assist  Walk 10 feet activity did not occur: Safety/medical concerns  Assist level: 2 helpers Assistive device: Lite Gait   Walk 50 feet activity   Assist Walk 50 feet with 2 turns activity did not occur: Safety/medical concerns  Assist level: 2 helpers Assistive device: Lite Gait    Walk 150 feet activity   Assist Walk 150  feet activity did not occur: Safety/medical concerns         Walk 10 feet on uneven surface  activity   Assist Walk 10 feet on uneven surfaces activity did not occur: Safety/medical concerns         Wheelchair     Assist Is the patient using a wheelchair?: Yes Type of Wheelchair: Manual    Wheelchair assist level: Moderate Assistance - Patient 50 - 74% Max wheelchair distance: 150'    Wheelchair 50 feet with 2 turns activity    Assist        Assist Level: Moderate Assistance - Patient 50 - 74%   Wheelchair 150 feet activity     Assist      Assist Level: Moderate Assistance - Patient 50 - 74%   Blood pressure (!) 132/98, pulse 62, temperature 98.5 F (36.9 C), resp. rate 19, height 6\' 3"  (1.905 m), weight 98.9 kg, SpO2 96%.  Medical Problem List and Plan: 1. 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 Pt has hx of prior CVA with residual aphasia.              -patient may shower -ELOS/Goals: 14-18, PT/OT/SLP min A to sup - 9/4 estimated discharge, family pursuing SNF due to increased caregiver burden and patient's wife and daughter both currently hospitalized             -Order PRAFO/WHO -utilize nightly             -Continue CIR therapies including PT, OT, and SLP  - Wife is in the hospital with renal infection, family discussing supports.  No current change in dispo plan.   2.  Antithrombotics: -DVT/anticoagulation:  Pharmaceutical: Xarelto 20mg  daily w/breakfast?>>now on eliquis 5mg  BID             -antiplatelet therapy: N/A  3. Pain Management/right sided spasticity:  Tylenol. Local measures.  --Per chart review was on tramadol? Naltrexone? Lidocaine patches?    -8-15: Increase spasticity right lower extremity, add baclofen 10 mg 3 times daily -8-16: Pain in right lower extremity, spasticity versus neuropathic, start gabapentin 100 mg 3 times daily.  Check renal  function on Monday 8/19: BL LE pain, aching today; increase tramadol to 50 mg BID; cannot increase baclofen/gabapentin d/t aki 8/20: Using PRN tylenol frequently; schedule tylenol 1000 mg TID, switch tramadol to 50 mg Q6H PRN 8-21: Cognition is improved and pain seems fairly well-controlled, monitor 8-27: DC tramadol, not utilizing -8-28: Ongoing issues with spasticity and knee, however would avoid baclofen increase due to CKD.  Could consider Botox as outpatient. 8/29: Make  baclofen PRN d/t AKI -pending lab work 8-30 may resume at lower dose due to increased knee and plantarflexion tone 9/2 consider tizanidine trial. Tone was most prevalent in RLE 4. Mood/Behavior/Sleep: LCSW to follow for evaluation and support. Lexapro 20mg  daily             -antipsychotic agents: N/A  -Trazodone PRN -not using, discontinue  5. Neuropsych/cognition: This patient is not capable of making decisions on his own behalf.  6. Skin/Wound Care: Routine pressure relief measures  7. Fluids/Electrolytes/ Nutrition/Dysphagia: Monitor I/O. Check CMET in am             --assist with meals  -8-15: Labs improved, discontinue IV fluid 50 cc/h; check Monday eating 75-100% meals per report  -therapies to work on encouraging p.o. fluids   -8/26 IVF 500 ccs today; encourage POs 9/2 labs appear close to baseline today. Continue to push po  8. HTN: Home meds Amlodipine 5mg , Hydralazine 25mg  daily,  goal SBP < 180, long term goal normotensive -Monitor BP TID continue amlodipine - stable -8-15: Has been mostly normotensive, will reduce amlodipine back to 5 mg as perfusion difficulty may be contributing to cognitive difficulties at this time. -8-16: Remains hypotensive today, will DC amlodipine; no current medications -8/17-18/24 BPs improving, monitor 8/19: Orthostatic; IVF, TEDs, Binder, currently not on PO BP medications 8-22: Much better with improved water intake -stable -9/2 stable; normotensive, monitor   Vitals:    11/11/22 1432 11/11/22 2103 11/12/22 0529 11/12/22 1256  BP: 135/83 120/88 (!) 132/109 115/82   11/12/22 2031 11/13/22 0431 11/13/22 1423 11/13/22 2012  BP: (!) 135/91 (!) 124/103 116/89 (!) 134/99   11/14/22 0514 11/14/22 1251 11/14/22 1940 11/15/22 0444  BP: (!) 126/92 (!) 130/95 116/85 (!) 132/98     9. A fib: Monitor HR TID. On xarelto. Heart rate controlled  - rate controlled, monitor  10.  Thrombocytopenia: Recheck platelets in am. Monitor for signs of bleeding.-- plt 156 on 10/28/22  11. Dysphagia: On D2,thins. Encourage fluid intake   - see above  12. Acute on chronic renal failure:  SCr 1.32 at admission. Appears baseline 1.3-1.4 --Continue to monitor and encourage intake  - uptrending on last labs 8/12, encourage fluids, BMP in AM -stable, reassess q. Monday - 8/19: AKI Cr 1.6 again; IVF today 75 cc/hr - 8/20: 1.4 today, DC IVF, POs excellent -8-22: Creatinine 1.3, improved.  Stable on p.o. regimen. - 8/26: Cr increase, BUN downtrending; 500 cc IVF today, recheck in AM 8-27 -creatinine back to baseline, BUN unchanged; encourage p.o. fluids, monitor 8/29: AKI; labs appear volume depletion/pre-renal; encourage PO fluids and IVF today, BMP in AM 9/2: BMP with Cr near baseline-  13. Hyponatremia: Na dropped to 132 in 48 hours. Recheck in am   - 8-15: Stable 132-135, monitor   -8/19: 136; 8-29 back to 134  14.  H/o CAD s/p PCI: Monitor for symptoms with increase in activity.             --continue Crestor 40mg  daily  15.  Situational depression: Lexapro 10mg  added on acute.    - Also benefit for motor recovery of UE; would not adjust  -8-27: Increase Lexapro for processing speed, motor recovery.  DC trazodone, tramadol for serotoninergic risks  8-28: Cognition does seem somewhat better today, will monitor for trend  8/29: Monitor BMPs given hyponatremia, however likely d/t dehydration, Tx as above  16. Hx of multiple cancers: R-RCC s/p nephrectomy 2015.  -Right adrenal  pheochromocytoma  s/p  right adrenalectomy 2017.  -Prostate CA s/p IMRT 2018.  Continue flomax -RUL nodule being monitored.  17. H/o Emphysema?: Noted on CT chest on care everywhere.    - stable  18. HLD. Continue statin as above  19. Substance abuse. Encourage cessation  20.  Urinary retention/Prostate CA s/p IMRT 2018.  Continue flomax.   -10/30/22 Remains incontinent but low PVRs, encourage up to bedside and monitor-- could claritin be interfering? -10/31/22 needed caths overnight; defer adjustments to weekday team 8/19: Add bethenacol 10 mg TID for retention 8/20: Increase to 25 mg TID 8-21: Increase bethanechol to 25 mg 3 times daily, and obtain renal ultrasound to ensure no obstruction.  If no improvement in 24 hours, will replace Foley. 8-22: Remains completely incontinent, renal ultrasound shows mildly thickened and trabeculated bladder but no gross obstruction.  Replace Foley, give patient urinary rest over the weekend and DC bethanechol due to potential side effects. - Appreciate urology evaluation and recommendation, maintain Foley with replacement every 30 days -outpatient follow-up for urodynamic studies  20. Constipation: -10/30/22 reported BM 2 days ago but looks like he had two yesterday but none for 2 days prior. Will schedule senokot 1 tab daily and add miralax daily PRN for now-- monitor, if oversoftened/too frequent with senokot then may just switch back to PRN but pt will have difficult time asking for PRNs -10/31/22 LBM yesterday, however has been smears since 8/17; increase bowel regimen to sennakot-s 2 tabs BID + miralax BID -11/08/22 LBM; smear 8/27 -sorbitol once this evening 8-28  - Last BM: 11/14/22  LOS: 20 days A FACE TO FACE EVALUATION WAS PERFORMED  Ranelle Oyster 11/15/2022, 9:58 AM

## 2022-11-15 NOTE — Progress Notes (Signed)
Occupational Therapy Session Note  Patient Details  Name: Frank Barron MRN: [id number] Date of Birth: Jun 27, 1950  Today's Date: 11/15/2022 OT Individual Time: 7616-0737 OT Individual Time Calculation (min): 60 min    Short Term Goals: Week 3:  OT Short Term Goal 1 (Week 3): LTG=STG 2.2 ELOS  Skilled Therapeutic Interventions/Progress Updates:    Pt received supine with no c/o pain, agreeable to OT session. He came to EOB with mod A for RLE management and positioning of hips. He sat EOB unsupported with min A occasionally for trunk control but overall (S). Gradual LOB posteriorly. He completed UB bathing with min A, requiring management of the RUE for positioning. He donned a shirt with mod A to facilitate hemi techniques. He completed sit  > stand from EOB with mod +2 assist, R pushing. Pt donned shorts and new brief with max A. Pt completed a slideboard transfer to the TIS w/c with heavy mod A. Oral care with min A overall. Asked pt about going outside and he was very enthusiastic in shaking head yes. He was taken via w/c. Outside benefits include improved mood and psychosocial adjustment. He tolerated passive RUE stretch through all planes of movement to maintain muscle length and improve tone. He returned inside and was left sitting up with all needs met, chair alarm set.   Saebo Stim One was placed on his R tricep to promote improved attention to the R and muscle activation. 60 min unattended, no adverse skin or pain reaction.  330 pulse width 35 Hz pulse rate On 8 sec/ off 8 sec Ramp up/ down 2 sec Symmetrical Biphasic wave form  Max intensity at 500 Ohm load  Therapy Documentation Precautions:  Precautions Precautions: Fall Precaution Comments: R hemiplegia, aphasia (inconsistent yes/no) Restrictions Weight Bearing Restrictions: No  Therapy/Group: Individual Therapy  Crissie Reese 11/15/2022, 6:50 AM

## 2022-11-15 NOTE — Progress Notes (Signed)
Physical Therapy Session Note  Patient Details  Name: Frank Barron MRN: [id number] Date of Birth: 1951/01/05  Today's Date: 11/15/2022 PT Individual Time: 1005-1100 PT Individual Time Calculation (min): 55 min   Short Term Goals: Week 3:  PT Short Term Goal 1 (Week 3): STG = LTG due to ELOS  Skilled Therapeutic Interventions/Progress Updates:    Pt presents in room in [facility], agreeable to PT. Pt unable to verbalize pain however in no acute distress at this time, monitored throughout session. Session focused on WC mobility and transfer training to facilitate independence with functional mobility.  Pt self propels min/mod assist from room to day room ~150' with LLE, requires verbal and tactile cues to initiate and maintain momentum. Pt positioned next to mat and completes slideboard transfer to R min assist for assist initiating direction of scoot, verbal cues for attention to task.  Pt then hooked up to lite gait and attempts to gait train via light gait for BLE muscle fiber recruitment and midline orientation however pt demonstrates excessive R lateropulsion in litegait, increased from previous trial ambulation as well as pt grunting as if in pain. Pt able to take two steps via BWS then returns to sitting. Pt asked if he has pain however demonstrates difficulty verbalizing. When asked if he wished to try again he says yes, but with stand pt continues to demonstrate R lateropulsion and returns to sitting, harness doffed.  Pt then completes slideboard transfer to L min assist to mat, improved initiation but assist needed for clearance. Pt completes x2 sit<>stand mod assist x2 with mirror positioned in front, demonstrates excessive R lateropulsion unable to correct with verbal and visual cues, continues to grunt as if in pain while in weightbearing. Pt returns to sitting and declines to participate with further standing.   Pt then completes slideboard transfer training to R mat to [facility], min/mod assist  for initiation due to lability with pt laughing. Pt prompted to complete slideboard transfer back to mat for mass practice transfer training with slideboard however pt declines further participation with transfer training.  Pt completes WC mobility ~160' with therapist positioned on pt R to decrease R path deviation, continues to require min/mod assist and verbal cues to correct for path deviation using LLE only. Pt able to initiate and maintain momentum improved with this trial compared to previous trials.  Pt returns to room and remains seated in [facility] with all needs within reach, cal light in place and chair alarm donned and activated at end of session.  Therapy Documentation Precautions:  Precautions Precautions: Fall Precaution Comments: R hemiplegia, aphasia (inconsistent yes/no) Restrictions Weight Bearing Restrictions: No   Therapy/Group: Individual Therapy  Edwin Cap PT, DPT 11/15/2022, 11:06 AM

## 2022-11-16 MED ORDER — TIZANIDINE HCL 4 MG PO TABS
2.0000 mg | ORAL_TABLET | Freq: Two times a day (BID) | ORAL | Status: DC
  Administered 2022-11-16 – 2022-11-19 (×7): 2 mg via ORAL
  Filled 2022-11-16 (×7): qty 1

## 2022-11-16 MED ORDER — AMLODIPINE BESYLATE 2.5 MG PO TABS
2.5000 mg | ORAL_TABLET | Freq: Every day | ORAL | Status: DC
  Administered 2022-11-16 – 2022-11-19 (×4): 2.5 mg via ORAL
  Filled 2022-11-16 (×4): qty 1

## 2022-11-16 NOTE — Progress Notes (Signed)
Occupational Therapy Session Note  Patient Details  Name: Frank Barron MRN: [id number] Date of Birth: September 24, 1950  Today's Date: 11/16/2022 OT Individual Time: 4098-1191 OT Individual Time Calculation (min): 45 min    Short Term Goals: Week 3:  OT Short Term Goal 1 (Week 3): LTG=STG 2.2 ELOS  Skilled Therapeutic Interventions/Progress Updates:    Pt greeted semi-reclined in bed and agreeable to OT treatment session. Noticed odor of BM. Bed level rolling with mod A to the R and max A L for total A peri-care and brief change for incontinent BM. Foley still in place. Pt initiated bringing L LE up to assist with threading pants, then initiated bridging, but still required rolling to pull pants up over hips. Pt completed bed mobility w. Mod A. Addressed sitting balance at EOB with min fading to supervision while completing UB bathing/dressing. Hand over hand to integrate R UE with increased tone in R arm. Gentle stretching and ROM to decrease R UE tone. Slideboard transfer to wc to the R side with max A and a little more difficulty with head/hips relationship this time. OT placed SAEBO e-stim on wrist extensors. SAEBO left on for 60 minutes. OT returned to remove SAEBO with skin intact and no adverse reactions.  Saebo Stim One 330 pulse width 35 Hz pulse rate On 8 sec/ off 8 sec Ramp up/ down 2 sec Symmetrical Biphasic wave form  Max intensity at 500 Ohm load  Pt left seated in [name] with alarm belt on, call bell in reach, and needs met.   Therapy Documentation Precautions:  Precautions Precautions: Fall Precaution Comments: R hemiplegia, aphasia (inconsistent yes/no) Restrictions Weight Bearing Restrictions: No Pain: Pain Assessment Pain Scale: Faces Faces Pain Scale: No hurt   Therapy/Group: Individual Therapy  [name] 11/16/2022, 10:33 AM

## 2022-11-16 NOTE — Progress Notes (Signed)
Encouraged fluids (water), provided education. Patent requires reinforcement. Will continue to provided fluids to ensure proper hydration.    Tilden Dome, LPN

## 2022-11-16 NOTE — Patient Care Conference (Signed)
Inpatient RehabilitationTeam Conference and Plan of Care Update Date: 11/16/2022   Time: 10:48 AM   Patient Name: Frank Barron      Medical Record Number: [id number]  Date of Birth: 03/09/1951 Sex: Male         Room/Bed: 4W08C/4W08C-01 Payor Info: Payor: [employer] / Plan: [employer] / Product Type: *No Product type* /    Admit Date/Time:  10/26/2022  2:42 PM  Primary Diagnosis:  Acute ischemic left middle cerebral artery (MCA) stroke [facility])  Hospital Problems: Principal Problem:   Acute ischemic left middle cerebral artery (MCA) stroke (HCC) Active Problems:   Acute renal failure superimposed on chronic kidney disease (HCC)   Hyponatremia    Expected Discharge Date: Expected Discharge Date:  (SNF)  Team Members Present: Physician leading conference: Dr. Faith Rogue Social Worker Present: Cecile Sheerer, LCSWA Nurse Present: Vedia Pereyra, RN PT Present: Malachi Pro, PT OT Present: Kearney Hard, OT SLP Present: Feliberto Gottron, SLP PPS Coordinator present : Edson Snowball, PT     Current Status/Progress Goal Weekly Team Focus  Bowel/Bladder   Pt incontinent of bowel and bladder, foley cath inplace for retention. LBM 9/3   Pt to regain continence   time tolieting every 4 hours    Swallow/Nutrition/ Hydration   regular/thin liquids, straws ok, intermittent supervision   Min A  tolerance of current diet, use of swallowing strategies    ADL's   Slideboard to the L side with mod A of 1 person, mod/max A slideboard to the R   Mod A overall   R NMR, NMES, activty tolerance, sit<>stands    Mobility   min/mod/a bed mobility, slideboard transfers min/modAx1, sit<>stand mod to maxAx2, WC mobility mod A   minA transfers, minA WC  Barriers: decreased caregiver assist, STE. Continue working on Teacher, early years/pre, WC mobility, NMR for standing balance and RLE    Communication   mod-max for multimodal communication   mod A   yes/no  accuracy, use of multimodal communication    Safety/Cognition/ Behavioral Observations  Mod A   Mod A   sustained attention to functional tasks, language is barrier to cognitive function assessment    Pain   pt verbalize mild pain with movement and transfers, pain controlled with tylenol scheduled Q8hr   pt to be pain free   assess every shift and prn    Skin    Skin is intact   Skin will remain intact  Assess every shift and PRN      Discharge Planning:  Pt is  SNF placement. SW waiting on preference from family and family will view locations today.  Discharge will  be pending SNF bed availability and insurance approval. SW will confirm there are no barriers to discharge.   Team Discussion: Acute ischemic left middle cerebral artery stroke. Coude foley placed 08/22 with urology follow up after discharge. Pain managed with PRN medications. Managing spasticity. Ted hose. Sleep chart. Encouraging fluids. Dysphagia improving. Tolerating regular diet. Intermittent supervision with meals.  S/B transfers improving. Mood improving.  Patient on target to meet rehab goals: yes, continues to progress towards goals with discharge to SNF  *See Care Plan and progress notes for long and short-term goals.   Revisions to Treatment Plan:  Monitor B/P.  Monitor labs/VS. Medication adjustments as needed.  Teaching Needs: Medications, safety, self care, transfer training, appropriate diet/textures with swallowing strategies, etc.   Current Barriers to Discharge: Lack of/limited family support and urinary retention  Possible Resolutions to Barriers: SNF  placement Improved communication techniques Independent with foley/bag care      Medical Summary Current Status: bp still borderline. foley in for retention--outpt urology follow up. spasticity a problem with decreased baclofen. aphasic  Barriers to Discharge: Medical stability;Spasticity   Possible Resolutions to [employer] Focus:  tizanidine trial (low dose). bp control. daily assessment of labs and pt data   Continued Need for Acute Rehabilitation Level of Care: The patient requires daily medical management by a physician with specialized training in physical medicine and rehabilitation for the following reasons: Direction of a multidisciplinary physical rehabilitation program to maximize functional independence : Yes Medical management of patient stability for increased activity during participation in an intensive rehabilitation regime.: Yes Analysis of laboratory values and/or radiology reports with any subsequent need for medication adjustment and/or medical intervention. : Yes   I attest that I was present, lead the team conference, and concur with the assessment and plan of the team.   Jearld Adjutant 11/17/2022, 1:02 PM

## 2022-11-16 NOTE — Progress Notes (Signed)
Speech Language Pathology Daily Session Note  Patient Details  Name: Frank Barron MRN: [id number] Date of Birth: 1950/12/21  Today's Date: 11/16/2022 SLP Individual Time: 1101-1200 SLP Individual Time Calculation (min): 59 min  Short Term Goals: Week 3: SLP Short Term Goal 1 (Week 3): STGs=LTGs 2* ELOS  Skilled Therapeutic Interventions: SLP conducted skilled therapy session targeting communication and cognitive goals. SLP guided patient through various receptive language tasks; during following 1-step direction task, patient benefited from reading direction while hearing it, mod visual cues, and min models. Patient answered Y/N questions with 75% accuracy and minA given visual aid. During picture identification task, patient identified pictured objects given name or description with 100% accuracy given minA. SLP then trialed letter identification from verbally presented letters, although patient required totalA visual cues to identify. During writing trials, patient wrote 2/3 of 'cat' independently requiring model for 't'. Patient required models for 'water', 'hungry', 'tired', 'yes', and 'no', however benefited from faded cues to replicate word without written model on second attempt. Cognitively, patient required minA to orient to location but was oriented to person. Patient exhibits reduced awareness of receptive language deficits. Patient was left in chair with call bell in reach and chair alarm set. SLP will continue to target goals per plan of care.      Pain Pain Assessment Pain Scale: 0-10 Pain Score: 0-No pain  Therapy/Group: Individual Therapy  Jeannie Done, M.A., CCC-SLP  [name] 11/16/2022, 12:28 PM

## 2022-11-16 NOTE — Progress Notes (Signed)
PROGRESS NOTE   Subjective/Complaints:  No new issues this morning. Up in bed. Denied pain. Conversation limited by aphasia  ROS: limited due to language/communication   Objective:   No results found. Recent Labs    11/15/22 0630  WBC 3.9*  HGB 12.2*  HCT 36.4*  PLT 176      Recent Labs    11/15/22 0630  NA 135  K 4.3  CL 105  CO2 23  GLUCOSE 85  BUN 17  CREATININE 1.29*  CALCIUM 9.0      Intake/Output Summary (Last 24 hours) at 11/16/2022 1126 Last data filed at 11/16/2022 0800 Gross per 24 hour  Intake 832 ml  Output 850 ml  Net -18 ml        Physical Exam: Vital Signs Blood pressure 121/82, pulse 63, temperature 98.9 F (37.2 C), temperature source Oral, resp. rate 18, height 6\' 3"  (1.905 m), weight 98.9 kg, SpO2 96%.  Constitutional: No distress . Vital signs reviewed. HEENT: NCAT, EOMI, oral membranes moist Neck: supple Cardiovascular: RRR without murmur. No JVD    Respiratory/Chest: CTA Bilaterally without wheezes or rales. Normal effort    GI/Abdomen: BS +, non-tender, non-distended Ext: no clubbing, cyanosis, or edema Psych: pleasant and cooperative  Skin: Clean and intact without signs of breakdown over exposed surfaces Neuro: AAO to place  only + expressive greater than receptive aphasia - can correct with extra time and cueing + right hemineglect is still present CN: + right facial droop, decreased shoulder shrug on the right -unchanged  Strength 5 out of 5 in left upper extremity left lower extremity, with exception of left hip flexion 3-/5 due to pain Strength 0/5 RUE and RLE   sensation to light touch intact in right upper extremity and right lower extremity per patient.  Sensation to pain intact.  MAS 1 R elbow flexors, shoulder adductors, MCP and PIP flexors--easily ranged MAS 2 R hamstrings and MAS 2 right plantar flexors   -stable.  Assessment/Plan: 1. Functional  deficits which require 3+ hours per day of interdisciplinary therapy in a comprehensive inpatient rehab setting. Physiatrist is providing close team supervision and 24 hour management of active medical problems listed below. Physiatrist and rehab team continue to assess barriers to discharge/monitor patient progress toward functional and medical goals  Care Tool:  Bathing    Body parts bathed by patient: Chest, Abdomen, Right upper leg, Left upper leg, Face, Right arm   Body parts bathed by helper: Right lower leg, Left lower leg, Buttocks, Front perineal area, Left arm     Bathing assist Assist Level: Maximal Assistance - Patient 24 - 49%     Upper Body Dressing/Undressing Upper body dressing   What is the patient wearing?: Pull over shirt    Upper body assist Assist Level: Moderate Assistance - Patient 50 - 74%    Lower Body Dressing/Undressing Lower body dressing      What is the patient wearing?: Pants     Lower body assist Assist for lower body dressing: Total Assistance - Patient < 25%     Toileting Toileting    Toileting assist Assist for toileting: 2 Helpers     Transfers  Chair/bed transfer  Transfers assist  Chair/bed transfer activity did not occur: Safety/medical concerns  Chair/bed transfer assist level: Minimal Assistance - Patient > 75%     Locomotion Ambulation   Ambulation assist   Ambulation activity did not occur: Safety/medical concerns  Assist level: 2 helpers Assistive device: Lite Gait Max distance: 50'   Walk 10 feet activity   Assist  Walk 10 feet activity did not occur: Safety/medical concerns  Assist level: 2 helpers Assistive device: Lite Gait   Walk 50 feet activity   Assist Walk 50 feet with 2 turns activity did not occur: Safety/medical concerns  Assist level: 2 helpers Assistive device: Lite Gait    Walk 150 feet activity   Assist Walk 150 feet activity did not occur: Safety/medical concerns          Walk 10 feet on uneven surface  activity   Assist Walk 10 feet on uneven surfaces activity did not occur: Safety/medical concerns         Wheelchair     Assist Is the patient using a wheelchair?: Yes Type of Wheelchair: Manual    Wheelchair assist level: Moderate Assistance - Patient 50 - 74% Max wheelchair distance: 160'    Wheelchair 50 feet with 2 turns activity    Assist        Assist Level: Moderate Assistance - Patient 50 - 74%   Wheelchair 150 feet activity     Assist      Assist Level: Moderate Assistance - Patient 50 - 74%   Blood pressure 121/82, pulse 63, temperature 98.9 F (37.2 C), temperature source Oral, resp. rate 18, height 6\' 3"  (1.905 m), weight 98.9 kg, SpO2 96%.  Medical Problem List and Plan: 1. 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 Pt has hx of prior CVA with residual aphasia.              -patient may shower -ELOS/Goals: SNF pending              -Order PRAFO/WHO -utilize nightly           -Continue CIR therapies including PT, OT, and SLP. Interdisciplinary team conference today to discuss goals, barriers to discharge, and dc planning.   - Wife is in the hospital with renal infection, family discussing supports.  No current change in dispo plan.   2.  Antithrombotics: -DVT/anticoagulation:  Pharmaceutical: Xarelto 20mg  daily w/breakfast?>>now on eliquis 5mg  BID             -antiplatelet therapy: N/A  3. Pain Management/right sided spasticity:  Tylenol. Local measures.  --Per chart review was on tramadol? Naltrexone? Lidocaine patches?    -8-15: Increase spasticity right lower extremity, add baclofen 10 mg 3 times daily -8-16: Pain in right lower extremity, spasticity versus neuropathic, start gabapentin 100 mg 3 times daily.  Check renal function on Monday 8/19: BL LE pain, aching today; increase tramadol to 50 mg BID; cannot increase  baclofen/gabapentin d/t aki 8/20: Using PRN tylenol frequently; schedule tylenol 1000 mg TID, switch tramadol to 50 mg Q6H PRN 8-21: Cognition is improved and pain seems fairly well-controlled, monitor 8-27: DC tramadol, not utilizing -8-28: Ongoing issues with spasticity and knee, however would avoid baclofen increase due to CKD.  Could consider Botox as outpatient. 8/29: Make baclofen PRN d/t AKI -pending lab work 8-30 may resume at lower dose due to increased knee and plantarflexion tone 9/3 will try low dose tizanidine  2mg  bid 4. Mood/Behavior/Sleep: LCSW to follow for evaluation and support. Lexapro 20mg  daily             -antipsychotic agents: N/A  -Trazodone PRN -not using, discontinue  5. Neuropsych/cognition: This patient is not capable of making decisions on his own behalf.  6. Skin/Wound Care: Routine pressure relief measures  7. Fluids/Electrolytes/ Nutrition/Dysphagia: Monitor I/O. Check CMET in am             --assist with meals  -8-15: Labs improved, discontinue IV fluid 50 cc/h; check Monday eating 75-100% meals per report  -therapies to work on encouraging p.o. fluids   -8/26 IVF 500 ccs today; encourage POs 9/2 labs appear close to baseline   Continue to push po  8. HTN: Home meds Amlodipine 5mg , Hydralazine 25mg  daily,  goal SBP < 180, long term goal normotensive -Monitor BP TID continue amlodipine - stable -8-15: Has been mostly normotensive, will reduce amlodipine back to 5 mg as perfusion difficulty may be contributing to cognitive difficulties at this time. -8-16: Remains hypotensive today, will DC amlodipine; no current medications -8/17-18/24 BPs improving, monitor 8/19: Orthostatic; IVF, TEDs, Binder, currently not on PO BP medications 8-22: Much better with improved water intake -stable -9/2-3 DBP remains elevated. Resume norvasc at 2.5mg  daily  Vitals:   11/12/22 1256 11/12/22 2031 11/13/22 0431 11/13/22 1423  BP: 115/82 (!) 135/91 (!) 124/103 116/89    11/13/22 2012 11/14/22 0514 11/14/22 1251 11/14/22 1940  BP: (!) 134/99 (!) 126/92 (!) 130/95 116/85   11/15/22 0444 11/15/22 1317 11/15/22 2053 11/16/22 0628  BP: (!) 132/98 (!) 125/94 (!) 132/90 121/82     9. A fib: Monitor HR TID. On xarelto. Heart rate controlled  - rate controlled, monitor  10.  Thrombocytopenia: Recheck platelets in am. Monitor for signs of bleeding.-- plt 156 on 10/28/22  11. Dysphagia: On D2,thins. Encourage fluid intake   - see above  12. Acute on chronic renal failure:  SCr 1.32 at admission. Appears baseline 1.3-1.4 --Continue to monitor and encourage intake  - uptrending on last labs 8/12, encourage fluids, BMP in AM -stable, reassess q. Monday - 8/19: AKI Cr 1.6 again; IVF today 75 cc/hr - 8/20: 1.4 today, DC IVF, POs excellent -8-22: Creatinine 1.3, improved.  Stable on p.o. regimen. - 8/26: Cr increase, BUN downtrending; 500 cc IVF today, recheck in AM 8-27 -creatinine back to baseline, BUN unchanged; encourage p.o. fluids, monitor 8/29: AKI; labs appear volume depletion/pre-renal; encourage PO fluids and IVF today, BMP in AM 9/2: BMP with Cr near baseline-push fluids  13. Hyponatremia: Na dropped to 132 in 48 hours. Recheck in am   - 8-15: Stable 132-135, monitor   -8/19: 136; 8-29 back to 134  14.  H/o CAD s/p PCI: Monitor for symptoms with increase in activity.             --continue Crestor 40mg  daily  15.  Situational depression: Lexapro 10mg  added on acute.    - Also benefit for motor recovery of UE; would not adjust  -8-27: Increase Lexapro for processing speed, motor recovery.  DC trazodone, tramadol for serotoninergic risks  8-28: Cognition does seem somewhat better today, will monitor for trend  8/29: Monitor BMPs given hyponatremia, however likely d/t dehydration, Tx as above  16. Hx of multiple cancers: R-RCC s/p nephrectomy 2015.  -Right adrenal pheochromocytoma  s/p right adrenalectomy 2017.  -Prostate CA s/p IMRT 2018.  Continue  flomax -RUL nodule being monitored.  17. H/o Emphysema?: Noted  on CT chest on care everywhere.    - stable  18. HLD. Continue statin as above  19. Substance abuse. Encourage cessation  20.  Urinary retention/Prostate CA s/p IMRT 2018.  Continue flomax.   -10/30/22 Remains incontinent but low PVRs, encourage up to bedside and monitor-- could claritin be interfering? -10/31/22 needed caths overnight; defer adjustments to weekday team 8/19: Add bethenacol 10 mg TID for retention 8/20: Increase to 25 mg TID 8-21: Increase bethanechol to 25 mg 3 times daily, and obtain renal ultrasound to ensure no obstruction.  If no improvement in 24 hours, will replace Foley. 8-22: Remains completely incontinent, renal ultrasound shows mildly thickened and trabeculated bladder but no gross obstruction.  Replace Foley, give patient urinary rest over the weekend and DC bethanechol due to potential side effects. - Appreciate urology evaluation and recommendation, maintain Foley with replacement every 30 days -outpatient follow-up for urodynamic studies  -foley stays in 20. Constipation: -10/30/22 reported BM 2 days ago but looks like he had two yesterday but none for 2 days prior. Will schedule senokot 1 tab daily and add miralax daily PRN for now-- monitor, if oversoftened/too frequent with senokot then may just switch back to PRN but pt will have difficult time asking for PRNs -10/31/22 LBM yesterday, however has been smears since 8/17; increase bowel regimen to sennakot-s 2 tabs BID + miralax BID -11/08/22 LBM; smear 8/27 -sorbitol once this evening 8-28  - Last BM: 11/15/22  LOS: 21 days A FACE TO FACE EVALUATION WAS PERFORMED  Ranelle Oyster 11/16/2022, 11:26 AM

## 2022-11-16 NOTE — Progress Notes (Signed)
Physical Therapy Session Note  Patient Details  Name: Frank Barron MRN: [id number] Date of Birth: Mar 15, 1951  Today's Date: 11/16/2022 PT Individual Time: 4098-1191 PT Individual Time Calculation (min): 55 min  Today's Date: 11/16/2022 PT Missed Time: 20 Minutes Missed Time Reason: Patient unwilling to participate  Short Term Goals: Week 2:  PT Short Term Goal 1 (Week 2): Pt will complete bed mobility (rolling, supine<>sit) min assist with hospital bed features PT Short Term Goal 1 - Progress (Week 2): Progressing toward goal PT Short Term Goal 2 (Week 2): Pt will complete sit<>stand min assist PT Short Term Goal 2 - Progress (Week 2): Progressing toward goal PT Short Term Goal 3 (Week 2): Pt will maintain standing with min assist x1 min PT Short Term Goal 3 - Progress (Week 2): Progressing toward goal PT Short Term Goal 4 (Week 2): Pt will consistently complete bed<>wc transfers with min assist PT Short Term Goal 4 - Progress (Week 2): Progressing toward goal (inconsistent) Week 3:  PT Short Term Goal 1 (Week 3): STG = LTG due to ELOS  Skilled Therapeutic Interventions/Progress Updates:   Received pt sitting in [facility] with wife present at bedside. Pt with inconsistent yes/no answers throughout session but did not appear to be in any pain. Pt transported to/from room in [facility] dependently for time management purposes. Set up slideboard transfer to mat, however pt not initating any movement and when therapist cued pt, he responded with "no" and looked away. Retreived communication board, but pt continued to point to "no" ultimately refusing any participation in therapy and when asked if pt wanted to return to room stated "yes".   With maximal encouragement, pt agreed to going outside and transported to entrance of [facility] in [name] dependently. Pt requested to sit in sunshine but not actively engaging with therapist, despite attempting to work on R attention and visual scanning to locate objects outside.  Pt appeared internally distracted, sad, and became slightly tearful while outside, but did communicate enjoying going out.   Transported back to room and pt refused to remain in [facility] for last PT session, mumbling yes" to returning to bed. Transferred [name]<>bed via slideboard to L with mod A and multiple small scoots. Removed shoes and transferred sit<>supine with mod A for RLE management. Pt scooted to [facility] with max A pulling with LUE on headboard. Concluded session with pt semi-reclined in bed, needs within reach, and bed alarm on. Pt with eyes closed before therapist left room. 20 minutes missed of skilled physical therapy due to unwillingness to participate.   Therapy Documentation Precautions:  Precautions Precautions: Fall Precaution Comments: R hemiplegia, aphasia (inconsistent yes/no) Restrictions Weight Bearing Restrictions: No  Therapy/Group: Individual Therapy Marlana Salvage Zaunegger Blima Rich PT, DPT 11/16/2022, 7:19 AM

## 2022-11-16 NOTE — Progress Notes (Signed)
Physical Therapy Session Note  Patient Details  Name: Frank Barron MRN: [id number] Date of Birth: 04/23/1950  Today's Date: 11/16/2022 PT Missed Time: 30 Minutes Missed Time Reason: Patient unwilling to participate  Short Term Goals: Week 3:  PT Short Term Goal 1 (Week 3): STG = LTG due to ELOS  Skilled Therapeutic Interventions/Progress Updates:    Pt received supine in bed. Shakes head "no" when asked if he was interested in participating in therapy. No reason provided. PT will follow up as able.   Therapy Documentation Precautions:  Precautions Precautions: Fall Precaution Comments: R hemiplegia, aphasia (inconsistent yes/no) Restrictions Weight Bearing Restrictions: No General: PT Amount of Missed Time (min): 30 Minutes PT Missed Treatment Reason: Patient unwilling to participate    Therapy/Group: Individual Therapy  Beau Fanny, PT, DPT 11/16/2022, 3:41 PM

## 2022-11-16 NOTE — Progress Notes (Signed)
Patient ID: Frank Barron, male   DOB: 08-Jun-1950, 72 y.o.   MRN: [id number]  SW spoke with pt dtr [name] to follow-up about SNF preference. States family intends to view today. She also reports that she spoke with his wife today, and would feel comfortable if he is able to stand or transfer, and ok with him going to a facility since she understands she is unable too. SW informed will follow-up after team conference with updates.   1552- SW left message for pt dtr [name] requesting return phone call to provide updates from team conference.   Cecile Sheerer, MSW, LCSWA Office: [phone] Cell: [phone] Fax: [phone]

## 2022-11-16 NOTE — Progress Notes (Signed)
Occupational Therapy Session Note  Patient Details  Name: Frank Barron MRN: [id number] Date of Birth: [birth year]  Today's Date: 11/17/2022 OT Individual Time: 8657-8469 OT Individual Time Calculation (min): 55 min    Short Term Goals: Week 3:  OT Short Term Goal 1 (Week 3): LTG=STG 2.2 ELOS  Skilled Therapeutic Interventions/Progress Updates:  Pt received resting in bed for skilled OT session with focus on BADL participation. Of note, pt provided with meal tray despite full-supervision status and foley tubing underneath LE, LPN made aware. Pt agreeable to interventions, demonstrating overall pleasant mood. Pt unable to rate pain, but does vocalize discomfort with movement of RLE upon knee flexion. Medicated during session. OT offering intermediate rest breaks and positioning suggestions throughout session to address pain/fatigue and maximize participation/safety in session.   Pt rolls L (cuing) and R (Mod A) to assist with LB bathing/dressing. Pt assists therapist in above activities by managing LLE and bathing anterior peri-area, overall Max-Total A for management of RLE, posterior care, and LB garments. Pt dependent for socks/shoes. Pt transitions to EOB with Min A (+2), maintaining balance with Min A overall. Pt dependent for SB placement, performing transfer with Mod A (+2 present for safety) and cuing for safe hand placement.   At sink-side, pt performs oral care of dentures with supervision. Pt doffs t-shirt with supervision, donning with education of hemi-dressing technique , overall Mod A. Pt bathes UB and face with HHA provided for integration of RUE.  Pt remained sitting in [name] with all immediate needs met at end of session. Pt continues to be appropriate for skilled OT intervention to promote further functional independence.   Therapy Documentation Precautions:  Precautions Precautions: Fall Precaution Comments: R hemiplegia, aphasia (inconsistent yes/no) Restrictions Weight  Bearing Restrictions: No   Therapy/Group: Individual Therapy  Lou Cal, OTR/L, MSOT  11/17/2022, 5:46 AM

## 2022-11-16 NOTE — Plan of Care (Signed)
  Problem: Sit to Stand Goal: LTG:  Patient will perform sit to stand in prep for activites of daily living with assistance level (OT) Description: LTG:  Patient will perform sit to stand in prep for activites of daily living with assistance level (OT) Outcome: Not Applicable Flowsheets (Taken 11/16/2022 1051) LTG: PT will perform sit to stand in prep for activites of daily living with assistance level: (d/c goal 9/3) --   Problem: RH Grooming Goal: LTG Patient will perform grooming w/assist,cues/equip (OT) Description: LTG: Patient will perform grooming with assist, with/without cues using equipment (OT) Outcome: Not Applicable   Problem: RH Bathing Goal: LTG Patient will bathe all body parts with assist levels (OT) Description: LTG: Patient will bathe all body parts with assist levels (OT) Outcome: Not Applicable Flowsheets (Taken 11/16/2022 1051) LTG: Pt will perform bathing with assistance level/cueing: Maximal Assistance - Patient 25 - 49% Note: Goal downgraded 9/3-ESD   Problem: RH Dressing Goal: LTG Patient will perform upper body dressing (OT) Description: LTG Patient will perform upper body dressing with assist, with/without cues (OT). Outcome: Not Applicable Flowsheets (Taken 11/16/2022 1051) LTG: Pt will perform upper body dressing with assistance level of: Moderate Assistance - Patient 50 - 74% Note: Goal downgraded 9/3-ESD Goal: LTG Patient will perform lower body dressing w/assist (OT) Description: LTG: Patient will perform lower body dressing with assist, with/without cues in positioning using equipment (OT) Outcome: Not Applicable Flowsheets (Taken 11/16/2022 1051) LTG: Pt will perform lower body dressing with assistance level of: (d/c goal 9/3-ESD) -- Note: D/c goal 9/3-ESD   Problem: RH Toileting Goal: LTG Patient will perform toileting task (3/3 steps) with assistance level (OT) Description: LTG: Patient will perform toileting task (3/3 steps) with assistance level (OT)   Outcome: Not Applicable Flowsheets (Taken 11/16/2022 1051) LTG: Pt will perform toileting task (3/3 steps) with assistance level: (d/c goal 9/3-ESD) -- Note: D/c goal 9/3-ESD   Problem: RH Toilet Transfers Goal: LTG Patient will perform toilet transfers w/assist (OT) Description: LTG: Patient will perform toilet transfers with assist, with/without cues using equipment (OT) Outcome: Not Applicable Flowsheets (Taken 11/16/2022 1051) LTG: Pt will perform toilet transfers with assistance level of: (d/c goal 9/3-ESD) -- Note: D/c goal 9/3-ESD   Problem: RH Functional Use of Upper Extremity Goal: LTG Patient will use RT/LT upper extremity as a (OT) Description: LTG: Patient will use right/left upper extremity as a stabilizer/gross assist/diminished/nondominant/dominant level with assist, with/without cues during functional activity (OT) Outcome: Not Applicable Flowsheets (Taken 11/16/2022 1051) LTG: Use of upper extremity in functional activities: RUE as a stabilizer Note: Goal downgraded 9/3-ESD

## 2022-11-17 DIAGNOSIS — K5900 Constipation, unspecified: Secondary | ICD-10-CM

## 2022-11-17 DIAGNOSIS — D696 Thrombocytopenia, unspecified: Secondary | ICD-10-CM

## 2022-11-17 NOTE — Progress Notes (Signed)
Physical Therapy Session Note  Patient Details  Name: Frank Barron MRN: [id number] Date of Birth: 1950-04-06  Today's Date: 11/17/2022 PT Individual Time: 4098-1191 PT Individual Time Calculation (min): 70 min   Short Term Goals: Week 3:  PT Short Term Goal 1 (Week 3): STG = LTG due to ELOS  Skilled Therapeutic Interventions/Progress Updates: Tx1: Pt presented in TIS, appears in NAD. Pt nodding head yes in agreement to therapy. PTA attempted to place BLE on leg rests with pt groaning when positioning RLE. PTA then performed gentle ROM to RLE focusing on hip mobility followed by grade I distraction for pain management. Pt transported to day room and set up in standing frame. Pt tolerated x 10 min on first bout of standing frame with PTA initially performing gentle shoulder ROM to RUE followed by AA TKE on RLE. Pt then performed TKE on LLE x5. PTA intermittent repositioning pt's trunk and loosening sling to promote increased core activation with upright positioning. Following seated rest pt tolerated another bout of standing which he tolerated for approx 8 min. Pt then completed reaching task with LUE crossing midline and grabbing squigz with PTA stabilizing RUE to increase weight bearing through RUE. PTA slackened sling and pt then performed mini - squats in small range for increased ms recruitment of RLE with PTA providing facilitation at trunk to maintain upright posture and to decrease pulling with LUE. Pt was able to complete x 3 with maxA. Once completed pt then transferred over to Cybex Kinetron and pt completed 3 rounds of 15 cycles with cues to push through end range with RLE. Pt transported back to room at end of session and left in TIS with belt alarm on, call bell within reach and needs met.   Tx2:      Therapy Documentation Precautions:  Precautions Precautions: Fall Precaution Comments: R hemiplegia, aphasia (inconsistent yes/no) Restrictions Weight Bearing Restrictions:  No General:   Vital Signs:   Pain:   Mobility:   Locomotion :    Trunk/Postural Assessment :    Balance:   Exercises:   Other Treatments:      Therapy/Group: Individual Therapy  [name] 11/17/2022, 12:53 PM

## 2022-11-17 NOTE — Plan of Care (Signed)
  Problem: RH BLADDER ELIMINATION Goal: RH STG MANAGE BLADDER WITH ASSISTANCE Description: STG Manage Bladder With mod I Assistance Outcome: Progressing   Problem: RH SAFETY Goal: RH STG ADHERE TO SAFETY PRECAUTIONS W/ASSISTANCE/DEVICE Description: STG Adhere to Safety Precautions With cues Assistance/Device. Outcome: Progressing   Problem: RH KNOWLEDGE DEFICIT Goal: RH STG INCREASE KNOWLEDGE OF HYPERTENSION Description: Patient and family will be able to manage HTN with medication and dietary modification using educational resources independently Outcome: Progressing   Problem: RH KNOWLEDGE DEFICIT Goal: RH STG INCREASE KNOWLEDGE OF DYSPHAGIA/FLUID INTAKE Description: Patient and family will be able to manage Dysphagia;medication and dietary modification using educational resources independently Outcome: Progressing

## 2022-11-17 NOTE — Progress Notes (Signed)
Speech Language Pathology Daily Session Note  Patient Details  Name: Frank Barron MRN: [id number] Date of Birth: 1951/02/19  Today's Date: 11/17/2022 SLP Individual Time: 1100-1200 SLP Individual Time Calculation (min): 60 min  Short Term Goals: Week 3: SLP Short Term Goal 1 (Week 3): STGs=LTGs 2* ELOS  Skilled Therapeutic Interventions: SLP conducted skilled therapy session targeting communication goals and cognitive retraining. SLP began session by conducting scanning/information synthesis task utilizing football game schedule of patient's favorite team. Patient required simplified language and totalA to select correct answer to questions from field of 4 written choices. During writing tasks, patient demonstrating progress with writing "yes" and "no", requiring minA. Patient continues to require models to copy "tired", "water', and "hungry". During yes/no questions, patient demonstrated 75% accuracy given min-modA. During session, SLP facilitated communication between patient and nutrition worker to help patient order food utilizing field of 3-6 written choices. Patient selected items when given these written choices with supervisionA. Patient was left in chair with call bell in reach and chair alarm set. SLP will continue to target goals per plan of care.      Pain Pain Assessment Pain Scale: 0-10 Pain Score: 0-No pain Faces Pain Scale: Hurts a little bit  Therapy/Group: Individual Therapy  Jeannie Done, M.A., CCC-SLP  [name] 11/17/2022, 3:45 PM

## 2022-11-17 NOTE — Progress Notes (Addendum)
Patient ID: Grzegorz Hamor, male   DOB: [birth year], 72 y.o.   MRN: [id number]  SW returned phone call to pt dtr [name] to follow-up with updates from team conference. SW shared on minimal gains made, pt improved mood, and continued support with regard to physical assistance. She is aware pt wife remains in agreement with placement. She is waiting to hear from her aunt about viewing the facility and preferred location.  *SW returned phone call to pt dtr [name]. Reports that her family has been unable to visit any SNF location due to illness. States she spoke with patient's wife and she prefers [facility]. She has asked SW to follow-up with patient's wife.  SW spoke with pt wife [name] to follow-up about preferred SNF location. Prefers [facility]. SW explained SNF  1505- SW called [employer] [phone]) to submit SNF auth but informed will need to call [employer] to submit insurance auth as his current plan is under [facility].   SW spoke with [employer] [phone]/[phone]/ref #[id number]) to submit insurance auth. Reports insurance auth can be submitted, and [facility] will review.  SW will fax clinicals. To check status of referral, will need to call back and speak with [facility] as unaware on how quickly [facility] takes to review information submitted.  SW faxed clinicals.  SW spoke with pt sister [name] and wife [name] to discuss above. Both aware SW will follow-up once there are more updates.   Cecile Sheerer, MSW, LCSWA Office: [phone] Cell: [phone] Fax: [phone]

## 2022-11-17 NOTE — Progress Notes (Signed)
PROGRESS NOTE   Subjective/Complaints:  No acute events overnight noted.  No new concerns this morning elicited.  ROS: limited due to language/communication   Objective:   No results found. Recent Labs    11/15/22 0630  WBC 3.9*  HGB 12.2*  HCT 36.4*  PLT 176      Recent Labs    11/15/22 0630  NA 135  K 4.3  CL 105  CO2 23  GLUCOSE 85  BUN 17  CREATININE 1.29*  CALCIUM 9.0      Intake/Output Summary (Last 24 hours) at 11/17/2022 1337 Last data filed at 11/17/2022 1000 Gross per 24 hour  Intake 354 ml  Output 2300 ml  Net -1946 ml        Physical Exam: Vital Signs Blood pressure (!) 131/97, pulse 69, temperature 98.2 F (36.8 C), temperature source Oral, resp. rate 17, height 6\' 3"  (1.905 m), weight 103.3 kg, SpO2 99%.  Constitutional: No distress . Vital signs reviewed. HEENT: NCAT, EOMI, oral membranes dry Neck: supple Cardiovascular: RRR without murmur. No JVD    Respiratory/Chest: CTA Bilaterally without wheezes or rales. Normal effort    GI/Abdomen: BS +, non-tender, non-distended Ext: no clubbing, cyanosis, or edema Psych: Cooperative, a little flat Skin: Clean and intact without signs of breakdown over exposed surfaces Neuro: AAO to place  only + expressive greater than receptive aphasia - can correct with extra time and cueing + right hemineglect is still present CN: + right facial droop, decreased shoulder shrug on the right -unchanged  Strength 5 out of 5 in left upper extremity left lower extremity, with exception of left hip flexion 3-/5 due to pain Strength 0/5 RUE and RLE   sensation to light touch intact in right upper extremity and right lower extremity per patient.  Sensation to pain intact.  MAS 1 R elbow flexors, shoulder adductors, MCP and PIP flexors--easily ranged MAS 2 R hamstrings and MAS 2 right plantar flexors   -stable.  Assessment/Plan: 1. Functional deficits  which require 3+ hours per day of interdisciplinary therapy in a comprehensive inpatient rehab setting. Physiatrist is providing close team supervision and 24 hour management of active medical problems listed below. Physiatrist and rehab team continue to assess barriers to discharge/monitor patient progress toward functional and medical goals  Care Tool:  Bathing    Body parts bathed by patient: Chest, Abdomen, Right upper leg, Left upper leg, Face, Right arm   Body parts bathed by helper: Right lower leg, Left lower leg, Buttocks, Front perineal area, Left arm     Bathing assist Assist Level: Maximal Assistance - Patient 24 - 49%     Upper Body Dressing/Undressing Upper body dressing   What is the patient wearing?: Pull over shirt    Upper body assist Assist Level: Moderate Assistance - Patient 50 - 74%    Lower Body Dressing/Undressing Lower body dressing      What is the patient wearing?: Pants     Lower body assist Assist for lower body dressing: Total Assistance - Patient < 25%     Toileting Toileting    Toileting assist Assist for toileting: Total Assistance - Patient < 25%  Transfers Chair/bed transfer  Transfers assist  Chair/bed transfer activity did not occur: Safety/medical concerns  Chair/bed transfer assist level: Minimal Assistance - Patient > 75%     Locomotion Ambulation   Ambulation assist   Ambulation activity did not occur: Safety/medical concerns  Assist level: 2 helpers Assistive device: Lite Gait Max distance: 50'   Walk 10 feet activity   Assist  Walk 10 feet activity did not occur: Safety/medical concerns  Assist level: 2 helpers Assistive device: Lite Gait   Walk 50 feet activity   Assist Walk 50 feet with 2 turns activity did not occur: Safety/medical concerns  Assist level: 2 helpers Assistive device: Lite Gait    Walk 150 feet activity   Assist Walk 150 feet activity did not occur: Safety/medical  concerns         Walk 10 feet on uneven surface  activity   Assist Walk 10 feet on uneven surfaces activity did not occur: Safety/medical concerns         Wheelchair     Assist Is the patient using a wheelchair?: Yes Type of Wheelchair: Manual    Wheelchair assist level: Moderate Assistance - Patient 50 - 74% Max wheelchair distance: 160'    Wheelchair 50 feet with 2 turns activity    Assist        Assist Level: Moderate Assistance - Patient 50 - 74%   Wheelchair 150 feet activity     Assist      Assist Level: Moderate Assistance - Patient 50 - 74%   Blood pressure (!) 131/97, pulse 69, temperature 98.2 F (36.8 C), temperature source Oral, resp. rate 17, height 6\' 3"  (1.905 m), weight 103.3 kg, SpO2 99%.  Medical Problem List and Plan: 1. 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 Pt has hx of prior CVA with residual aphasia.              -patient may shower -ELOS/Goals: SNF pending              -Order PRAFO/WHO -utilize nightly           -Continue CIR therapies including PT, OT, and SLP. Interdisciplinary team conference today to discuss goals, barriers to discharge, and dc planning.   - Wife is in the hospital with renal infection, family discussing supports.  No current change in dispo plan. -Aunt has been reviewing potential facilities   2.  Antithrombotics: -DVT/anticoagulation:  Pharmaceutical: Xarelto 20mg  daily w/breakfast?>>now on eliquis 5mg  BID             -antiplatelet therapy: N/A  3. Pain Management/right sided spasticity:  Tylenol. Local measures.  --Per chart review was on tramadol? Naltrexone? Lidocaine patches?    -8-15: Increase spasticity right lower extremity, add baclofen 10 mg 3 times daily -8-16: Pain in right lower extremity, spasticity versus neuropathic, start gabapentin 100 mg 3 times daily.  Check renal function on Monday 8/19: BL LE  pain, aching today; increase tramadol to 50 mg BID; cannot increase baclofen/gabapentin d/t aki 8/20: Using PRN tylenol frequently; schedule tylenol 1000 mg TID, switch tramadol to 50 mg Q6H PRN 8-21: Cognition is improved and pain seems fairly well-controlled, monitor 8-27: DC tramadol, not utilizing -8-28: Ongoing issues with spasticity and knee, however would avoid baclofen increase due to CKD.  Could consider Botox as outpatient. 8/29: Make baclofen PRN d/t AKI -pending lab work 8-30 may resume at lower dose due to increased knee and  plantarflexion tone 9/3 will try low dose tizanidine 2mg  bid 4. Mood/Behavior/Sleep: LCSW to follow for evaluation and support. Lexapro 20mg  daily             -antipsychotic agents: N/A  -Trazodone PRN -not using, discontinue  5. Neuropsych/cognition: This patient is not capable of making decisions on his own behalf.  6. Skin/Wound Care: Routine pressure relief measures  7. Fluids/Electrolytes/ Nutrition/Dysphagia: Monitor I/O. Check CMET in am             --assist with meals  -8-15: Labs improved, discontinue IV fluid 50 cc/h; check Monday eating 75-100% meals per report  -therapies to work on encouraging p.o. fluids   -8/26 IVF 500 ccs today; encourage POs 9/2 labs appear close to baseline   Continue to push po  8. HTN: Home meds Amlodipine 5mg , Hydralazine 25mg  daily,  goal SBP < 180, long term goal normotensive -Monitor BP TID continue amlodipine - stable -8-15: Has been mostly normotensive, will reduce amlodipine back to 5 mg as perfusion difficulty may be contributing to cognitive difficulties at this time. -8-16: Remains hypotensive today, will DC amlodipine; no current medications -8/17-18/24 BPs improving, monitor 8/19: Orthostatic; IVF, TEDs, Binder, currently not on PO BP medications 8-22: Much better with improved water intake -stable -9/2-3 DBP remains elevated. Resume norvasc at 2.5mg  daily -9/4 DBP still elevated at times, monitor  response to medication change today  Vitals:   11/13/22 1423 11/13/22 2012 11/14/22 0514 11/14/22 1251  BP: 116/89 (!) 134/99 (!) 126/92 (!) 130/95   11/14/22 1940 11/15/22 0444 11/15/22 1317 11/15/22 2053  BP: 116/85 (!) 132/98 (!) 125/94 (!) 132/90   11/16/22 0628 11/16/22 1238 11/16/22 2005 11/17/22 0502  BP: 121/82 114/83 (!) 133/101 (!) 131/97     9. A fib: Monitor HR TID. On xarelto. Heart rate controlled  - rate controlled, monitor  10.  Thrombocytopenia: Recheck platelets in am. Monitor for signs of bleeding.-- plt 156 on 10/28/22  -Platelets stable at 176 on 9/2  11. Dysphagia: On D2,thins. Encourage fluid intake   - see above  12. Acute on chronic renal failure:  SCr 1.32 at admission. Appears baseline 1.3-1.4 --Continue to monitor and encourage intake  - uptrending on last labs 8/12, encourage fluids, BMP in AM -stable, reassess q. Monday - 8/19: AKI Cr 1.6 again; IVF today 75 cc/hr - 8/20: 1.4 today, DC IVF, POs excellent -8-22: Creatinine 1.3, improved.  Stable on p.o. regimen. - 8/26: Cr increase, BUN downtrending; 500 cc IVF today, recheck in AM 8-27 -creatinine back to baseline, BUN unchanged; encourage p.o. fluids, monitor 8/29: AKI; labs appear volume depletion/pre-renal; encourage PO fluids and IVF today, BMP in AM 9/2: BMP with Cr near baseline-push fluids 9/4 Recheck BMP tomorrow , encourage PO fluids  13. Hyponatremia: Na dropped to 132 in 48 hours. Recheck in am   - 8-15: Stable 132-135, monitor   -8/19: 136; 8-29 back to 134  14.  H/o CAD s/p PCI: Monitor for symptoms with increase in activity.             --continue Crestor 40mg  daily  15.  Situational depression: Lexapro 10mg  added on acute.    - Also benefit for motor recovery of UE; would not adjust  -8-27: Increase Lexapro for processing speed, motor recovery.  DC trazodone, tramadol for serotoninergic risks  8-28: Cognition does seem somewhat better today, will monitor for trend  8/29: Monitor  BMPs given hyponatremia, however likely d/t dehydration, Tx as above  16.  Hx of multiple cancers: R-RCC s/p nephrectomy 2015.  -Right adrenal pheochromocytoma  s/p right adrenalectomy 2017.  -Prostate CA s/p IMRT 2018.  Continue flomax -RUL nodule being monitored.  17. H/o Emphysema?: Noted on CT chest on care everywhere.    - stable  18. HLD. Continue statin as above  19. Substance abuse. Encourage cessation  20.  Urinary retention/Prostate CA s/p IMRT 2018.  Continue flomax.   -10/30/22 Remains incontinent but low PVRs, encourage up to bedside and monitor-- could claritin be interfering? -10/31/22 needed caths overnight; defer adjustments to weekday team 8/19: Add bethenacol 10 mg TID for retention 8/20: Increase to 25 mg TID 8-21: Increase bethanechol to 25 mg 3 times daily, and obtain renal ultrasound to ensure no obstruction.  If no improvement in 24 hours, will replace Foley. 8-22: Remains completely incontinent, renal ultrasound shows mildly thickened and trabeculated bladder but no gross obstruction.  Replace Foley, give patient urinary rest over the weekend and DC bethanechol due to potential side effects. - Appreciate urology evaluation and recommendation, maintain Foley with replacement every 30 days -outpatient follow-up for urodynamic studies  -foley stays in  20. Constipation: -10/30/22 reported BM 2 days ago but looks like he had two yesterday but none for 2 days prior. Will schedule senokot 1 tab daily and add miralax daily PRN for now-- monitor, if oversoftened/too frequent with senokot then may just switch back to PRN but pt will have difficult time asking for PRNs -10/31/22 LBM yesterday, however has been smears since 8/17; increase bowel regimen to sennakot-s 2 tabs BID + miralax BID -11/08/22 LBM; smear 8/27 -sorbitol once this evening 8-28  - Last BM: 11/17/22- large, continue current regimen  LOS: 22 days A FACE TO FACE EVALUATION WAS PERFORMED  Fanny Dance 11/17/2022, 1:37 PM

## 2022-11-18 LAB — BASIC METABOLIC PANEL
Anion gap: 5 (ref 5–15)
BUN: 21 mg/dL (ref 8–23)
CO2: 27 mmol/L (ref 22–32)
Calcium: 9.1 mg/dL (ref 8.9–10.3)
Chloride: 107 mmol/L (ref 98–111)
Creatinine, Ser: 1.25 mg/dL — ABNORMAL HIGH (ref 0.61–1.24)
GFR, Estimated: >60 mL/min (ref >60)
Glucose, Bld: 90 mg/dL (ref 70–99)
Potassium: 4.5 mmol/L (ref 3.5–5.1)
Sodium: 139 mmol/L (ref 135–145)

## 2022-11-18 LAB — CBC
HCT: 36.2 % — ABNORMAL LOW (ref 39.0–52.0)
Hemoglobin: 12 g/dL — ABNORMAL LOW (ref 13.0–17.0)
MCH: 29.2 pg (ref 26.0–34.0)
MCHC: 33.1 g/dL (ref 30.0–36.0)
MCV: 88.1 fL (ref 80.0–100.0)
Platelets: 161 10*3/uL (ref 150–400)
RBC: 4.11 MIL/uL — ABNORMAL LOW (ref 4.22–5.81)
RDW: 13.5 % (ref 11.5–15.5)
WBC: 3.4 10*3/uL — ABNORMAL LOW (ref 4.0–10.5)
nRBC: 0 % (ref 0.0–0.2)

## 2022-11-18 NOTE — Progress Notes (Signed)
PROGRESS NOTE   Subjective/Complaints:  No acute events overnight. No new concerns elicited.   ROS: limited due to language/communication   Objective:   No results found. Recent Labs    11/18/22 0644  WBC 3.4*  HGB 12.0*  HCT 36.2*  PLT 161      Recent Labs    11/18/22 0644  NA 139  K 4.5  CL 107  CO2 27  GLUCOSE 90  BUN 21  CREATININE 1.25*  CALCIUM 9.1      Intake/Output Summary (Last 24 hours) at 11/18/2022 1354 Last data filed at 11/18/2022 1231 Gross per 24 hour  Intake 354 ml  Output 1650 ml  Net -1296 ml        Physical Exam: Vital Signs Blood pressure 103/70, pulse 66, temperature 97.6 F (36.4 C), resp. rate 16, height 6\' 3"  (1.905 m), weight 103.3 kg, SpO2 97%.  Constitutional: No distress . Vital signs reviewed. Appears comfortable HEENT: NCAT, EOMI, oral membranes moist  Neck: supple Cardiovascular: RRR without murmur. No JVD    Respiratory/Chest: CTA Bilaterally without wheezes or rales. Good air movement GI/Abdomen: BS +, non-tender, non-distended Ext: no clubbing, cyanosis, or edema Psych: Cooperative, appropriate  Skin: Clean and intact without signs of breakdown over exposed surfaces Neuro: AAO to place  only + expressive greater than receptive aphasia - can correct with extra time and cueing + right hemineglect is still present CN: + right facial droop, decreased shoulder shrug on the right -unchanged  Strength 5 out of 5 in left upper extremity left lower extremity, with exception of left hip flexion 3-/5 due to pain Strength 0/5 RUE and RLE   sensation to light touch intact in right upper extremity and right lower extremity per patient.  Sensation to pain intact.  MAS 1 R elbow flexors, shoulder adductors, MCP and PIP flexors--easily ranged MAS 2 R hamstrings and MAS 2 right plantar flexors   -stable.  Assessment/Plan: 1. Functional deficits which require 3+ hours per  day of interdisciplinary therapy in a comprehensive inpatient rehab setting. Physiatrist is providing close team supervision and 24 hour management of active medical problems listed below. Physiatrist and rehab team continue to assess barriers to discharge/monitor patient progress toward functional and medical goals  Care Tool:  Bathing    Body parts bathed by patient: Chest, Abdomen, Right upper leg, Left upper leg, Face, Right arm   Body parts bathed by helper: Right lower leg, Left lower leg, Buttocks, Front perineal area, Left arm     Bathing assist Assist Level: Maximal Assistance - Patient 24 - 49%     Upper Body Dressing/Undressing Upper body dressing   What is the patient wearing?: Pull over shirt    Upper body assist Assist Level: Moderate Assistance - Patient 50 - 74%    Lower Body Dressing/Undressing Lower body dressing      What is the patient wearing?: Pants     Lower body assist Assist for lower body dressing: Total Assistance - Patient < 25%     Toileting Toileting    Toileting assist Assist for toileting: Total Assistance - Patient < 25%     Transfers Chair/bed transfer  Transfers  assist  Chair/bed transfer activity did not occur: Safety/medical concerns  Chair/bed transfer assist level: Minimal Assistance - Patient > 75%     Locomotion Ambulation   Ambulation assist   Ambulation activity did not occur: Safety/medical concerns  Assist level: 2 helpers Assistive device: Lite Gait Max distance: 50'   Walk 10 feet activity   Assist  Walk 10 feet activity did not occur: Safety/medical concerns  Assist level: 2 helpers Assistive device: Lite Gait   Walk 50 feet activity   Assist Walk 50 feet with 2 turns activity did not occur: Safety/medical concerns  Assist level: 2 helpers Assistive device: Lite Gait    Walk 150 feet activity   Assist Walk 150 feet activity did not occur: Safety/medical concerns         Walk 10 feet  on uneven surface  activity   Assist Walk 10 feet on uneven surfaces activity did not occur: Safety/medical concerns         Wheelchair     Assist Is the patient using a wheelchair?: Yes Type of Wheelchair: Manual    Wheelchair assist level: Moderate Assistance - Patient 50 - 74% Max wheelchair distance: 160'    Wheelchair 50 feet with 2 turns activity    Assist        Assist Level: Moderate Assistance - Patient 50 - 74%   Wheelchair 150 feet activity     Assist      Assist Level: Moderate Assistance - Patient 50 - 74%   Blood pressure 103/70, pulse 66, temperature 97.6 F (36.4 C), resp. rate 16, height 6\' 3"  (1.905 m), weight 103.3 kg, SpO2 97%.  Medical Problem List and Plan: 1. 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 Pt has hx of prior CVA with residual aphasia.              -patient may shower -ELOS/Goals: SNF pending              -Order PRAFO/WHO -utilize nightly           -Continue CIR therapies including PT, OT, and SLP. Interdisciplinary team conference today to discuss goals, barriers to discharge, and dc planning.   - Wife is in the hospital with renal infection, family discussing supports.  No current change in dispo plan. -Aunt has been reviewing potential facilities   2.  Antithrombotics: -DVT/anticoagulation:  Pharmaceutical: Xarelto 20mg  daily w/breakfast?>>now on eliquis 5mg  BID             -antiplatelet therapy: N/A  3. Pain Management/right sided spasticity:  Tylenol. Local measures.  --Per chart review was on tramadol? Naltrexone? Lidocaine patches?    -8-15: Increase spasticity right lower extremity, add baclofen 10 mg 3 times daily -8-16: Pain in right lower extremity, spasticity versus neuropathic, start gabapentin 100 mg 3 times daily.  Check renal function on Monday 8/19: BL LE pain, aching today; increase tramadol to 50 mg BID; cannot increase  baclofen/gabapentin d/t aki 8/20: Using PRN tylenol frequently; schedule tylenol 1000 mg TID, switch tramadol to 50 mg Q6H PRN 8-21: Cognition is improved and pain seems fairly well-controlled, monitor 8-27: DC tramadol, not utilizing -8-28: Ongoing issues with spasticity and knee, however would avoid baclofen increase due to CKD.  Could consider Botox as outpatient. 8/29: Make baclofen PRN d/t AKI -pending lab work 8-30 may resume at lower dose due to increased knee and plantarflexion tone 9/3 will try low dose tizanidine 2mg   bid  4. Mood/Behavior/Sleep: LCSW to follow for evaluation and support. Lexapro 20mg  daily             -antipsychotic agents: N/A  -Trazodone PRN -not using, discontinue  5. Neuropsych/cognition: This patient is not capable of making decisions on his own behalf.  6. Skin/Wound Care: Routine pressure relief measures  7. Fluids/Electrolytes/ Nutrition/Dysphagia: Monitor I/O. Check CMET in am             --assist with meals  -8-15: Labs improved, discontinue IV fluid 50 cc/h; check Monday eating 75-100% meals per report  -therapies to work on encouraging p.o. fluids   -8/26 IVF 500 ccs today; encourage POs 9/2 labs appear close to baseline   Continue to push po  8. HTN: Home meds Amlodipine 5mg , Hydralazine 25mg  daily,  goal SBP < 180, long term goal normotensive -Monitor BP TID continue amlodipine - stable -8-15: Has been mostly normotensive, will reduce amlodipine back to 5 mg as perfusion difficulty may be contributing to cognitive difficulties at this time. -8-16: Remains hypotensive today, will DC amlodipine; no current medications -8/17-18/24 BPs improving, monitor 8/19: Orthostatic; IVF, TEDs, Binder, currently not on PO BP medications 8-22: Much better with improved water intake -stable -9/2-3 DBP remains elevated. Resume norvasc at 2.5mg  daily -9/4 DBP still elevated at times, monitor response to medication change today -9/5 BP elevated at times but  appears overall improved, continue to monitor   Vitals:   11/14/22 1251 11/14/22 1940 11/15/22 0444 11/15/22 1317  BP: (!) 130/95 116/85 (!) 132/98 (!) 125/94   11/15/22 2053 11/16/22 0628 11/16/22 1238 11/16/22 2005  BP: (!) 132/90 121/82 114/83 (!) 133/101   11/17/22 0502 11/17/22 2010 11/18/22 0407 11/18/22 0748  BP: (!) 131/97 115/83 (!) 123/97 103/70     9. A fib: Monitor HR TID. On xarelto. Heart rate controlled  -9/5 HR controlled     11/18/2022    7:48 AM 11/18/2022    4:07 AM 11/17/2022    8:10 PM  Vitals with BMI  Systolic 103 123 161  Diastolic 70 97 83  Pulse  66 93     10.  Thrombocytopenia: Recheck platelets in am. Monitor for signs of bleeding.-- plt 156 on 10/28/22  -Platelets stable at 176 on 9/2  11. Dysphagia: On D2,thins. Encourage fluid intake   - see above  12. Acute on chronic renal failure:  SCr 1.32 at admission. Appears baseline 1.3-1.4 --Continue to monitor and encourage intake  - uptrending on last labs 8/12, encourage fluids, BMP in AM -stable, reassess q. Monday - 8/19: AKI Cr 1.6 again; IVF today 75 cc/hr - 8/20: 1.4 today, DC IVF, POs excellent -8-22: Creatinine 1.3, improved.  Stable on p.o. regimen. - 8/26: Cr increase, BUN downtrending; 500 cc IVF today, recheck in AM 8-27 -creatinine back to baseline, BUN unchanged; encourage p.o. fluids, monitor 8/29: AKI; labs appear volume depletion/pre-renal; encourage PO fluids and IVF today, BMP in AM 9/2: BMP with Cr near baseline-push fluids 9/5 BMP stable with Cr 1.26/Bun 21, continue to encourage fluids  13. Hyponatremia: Na dropped to 132 in 48 hours. Recheck in am   - 8-15: Stable 132-135, monitor   -8/19: 136; 8-29 back to 134  9/5 stable NA at 139  14.  H/o CAD s/p PCI: Monitor for symptoms with increase in activity.             --continue Crestor 40mg  daily  15.  Situational depression: Lexapro 10mg  added on acute.    -  Also benefit for motor recovery of UE; would not adjust  -8-27:  Increase Lexapro for processing speed, motor recovery.  DC trazodone, tramadol for serotoninergic risks  8-28: Cognition does seem somewhat better today, will monitor for trend  8/29: Monitor BMPs given hyponatremia, however likely d/t dehydration, Tx as above  16. Hx of multiple cancers: R-RCC s/p nephrectomy 2015.  -Right adrenal pheochromocytoma  s/p right adrenalectomy 2017.  -Prostate CA s/p IMRT 2018.  Continue flomax -RUL nodule being monitored.  17. H/o Emphysema?: Noted on CT chest on care everywhere.    - stable  18. HLD. Continue statin as above  19. Substance abuse. Encourage cessation  20.  Urinary retention/Prostate CA s/p IMRT 2018.  Continue flomax.   -10/30/22 Remains incontinent but low PVRs, encourage up to bedside and monitor-- could claritin be interfering? -10/31/22 needed caths overnight; defer adjustments to weekday team 8/19: Add bethenacol 10 mg TID for retention 8/20: Increase to 25 mg TID 8-21: Increase bethanechol to 25 mg 3 times daily, and obtain renal ultrasound to ensure no obstruction.  If no improvement in 24 hours, will replace Foley. 8-22: Remains completely incontinent, renal ultrasound shows mildly thickened and trabeculated bladder but no gross obstruction.  Replace Foley, give patient urinary rest over the weekend and DC bethanechol due to potential side effects. - Appreciate urology evaluation and recommendation, maintain Foley with replacement every 30 days -outpatient follow-up for urodynamic studies  -foley stays in  20. Constipation: -10/30/22 reported BM 2 days ago but looks like he had two yesterday but none for 2 days prior. Will schedule senokot 1 tab daily and add miralax daily PRN for now-- monitor, if oversoftened/too frequent with senokot then may just switch back to PRN but pt will have difficult time asking for PRNs -10/31/22 LBM yesterday, however has been smears since 8/17; increase bowel regimen to sennakot-s 2 tabs BID + miralax  BID -11/08/22 LBM; smear 8/27 -sorbitol once this evening 8-28  - Last BM: 11/17/22- large, continue current regimen  LOS: 23 days A FACE TO FACE EVALUATION WAS PERFORMED  Fanny Dance 11/18/2022, 1:54 PM

## 2022-11-18 NOTE — Plan of Care (Signed)
  Problem: Consults Goal: RH STROKE PATIENT EDUCATION Description: See Patient Education module for education specifics  Outcome: Progressing   Problem: RH BOWEL ELIMINATION Goal: RH STG MANAGE BOWEL WITH ASSISTANCE Description: STG Manage Bowel with mod I Assistance. Outcome: Progressing Goal: RH STG MANAGE BOWEL W/MEDICATION W/ASSISTANCE Description: STG Manage Bowel with Medication with mod I Assistance. Outcome: Progressing   Problem: RH BLADDER ELIMINATION Goal: RH STG MANAGE BLADDER WITH ASSISTANCE Description: STG Manage Bladder With mod I Assistance Outcome: Progressing   Problem: RH SAFETY Goal: RH STG ADHERE TO SAFETY PRECAUTIONS W/ASSISTANCE/DEVICE Description: STG Adhere to Safety Precautions With cues Assistance/Device. Outcome: Progressing   Problem: RH KNOWLEDGE DEFICIT Goal: RH STG INCREASE KNOWLEDGE OF DIABETES Description:  Patient and family will be able to manage DM with medication and dietary modification using educational resources independently Outcome: Progressing Goal: RH STG INCREASE KNOWLEDGE OF HYPERTENSION Description: Patient and family will be able to manage HTN with medication and dietary modification using educational resources independently Outcome: Progressing Goal: RH STG INCREASE KNOWLEDGE OF DYSPHAGIA/FLUID INTAKE Description: Patient and family will be able to manage Dysphagia;medication and dietary modification using educational resources independently Outcome: Progressing Goal: RH STG INCREASE KNOWLEGDE OF HYPERLIPIDEMIA Description: Patient and family will be able to manage HLD with medication and dietary modification using educational resources independently Outcome: Progressing Goal: RH STG INCREASE KNOWLEDGE OF STROKE PROPHYLAXIS Description: Patient and family will be able to manage secondary risks with medication and dietary modification using educational resources independently Outcome: Progressing

## 2022-11-18 NOTE — Progress Notes (Signed)
Patient ID: Frank Barron, male   DOB: 1950/04/30, 72 y.o.   MRN: [id number]  SW spoke with [facility] ([phone]/f:[phone]/ref #[id number]) to check status of referral and if clinicals received. Clinicals have been received, and status is pending at this time.   Cecile Sheerer, MSW, LCSWA Office: [phone] Cell: [phone] Fax: [phone]

## 2022-11-18 NOTE — Progress Notes (Signed)
Occupational Therapy Session Note  Patient Details  Name: Frank Barron MRN: [id number] Date of Birth: April 28, 1950  Today's Date: 11/18/2022 OT Individual Time: 8657-8469 OT Individual Time Calculation (min): 42 min    Short Term Goals: Week 3:  OT Short Term Goal 1 (Week 3): LTG=STG 2.2 ELOS  Skilled Therapeutic Interventions/Progress Updates:    Pt greeted seated in [name] and agreeable to OT treatment session. Today is patients birthday. OT asked pt how old he is and pt stated "[age]." Pt was able to state is correct age. Pt brought outside for fresh air. OT worked on R UE NMR and self ROM of R UE. Pt with increased tone throughout R UE. Gentle ROM to shoulder/elbow/wrist/hand with decreased tone. OT provided joint input through wrist and hand to help bring pt through full ROM and facilitate weight bearing. Educated on self ROM techniques which pt demonstrated understanding with demonstration. Pt returned to room. OT placed SAEBO e-stim on wrist extensors. SAEBO left on for 60 minutes. OT returned to remove SAEBO with skin intact and no adverse reactions.  Sa[place]o Stim One 330 pulse width 35 Hz pulse rate On 8 sec/ off 8 sec Ramp up/ down 2 sec Symmetrical Biphasic wave form  Max intensity at 500 Ohm load  Pt left seated in [name] with alarm belt on, call bell in reach, and needs met.   Therapy Documentation Precautions:  Precautions Precautions: Fall Precaution Comments: R hemiplegia, aphasia (inconsistent yes/no) Restrictions Weight Bearing Restrictions: No Pain: Pain Assessment Pain Scale: 0-10 Pain Score: 0-No pain  Therapy/Group: Individual Therapy  [name] 11/18/2022, 11:41 AM

## 2022-11-18 NOTE — Progress Notes (Signed)
Speech Language Pathology Weekly Progress and Session Note  Patient Details  Name: Frank Barron MRN: [id number] Date of Birth: [birth year]  Beginning of progress report period: November 12, 2022 End of progress report period: November 18, 2022  Today's Date: 11/18/2022 SLP Individual Time: 0802-0900 SLP Individual Time Calculation (min): 58 min  Short Term Goals: Week 3: SLP Short Term Goal 1 (Week 3): STGs=LTGs 2* ELOS SLP Short Term Goal 1 - Progress (Week 3): Not met Patient originally scheduled to discharge home 9/4, thus SLP set short term goals = long term goals d/t this expectation. D/t difficulty with SNF placement, patient's length of stay was elongated, thus new short term goals for week 4 were set below.   New Short Term Goals: Week 4: SLP Short Term Goal 1 (Week 4): Patient will consume least restrictive diet texture w/ supervision visual/verbal cues to clear oral residue and R buccal pocketing SLP Short Term Goal 2 (Week 4): Patient will utilize multimodal means of communication to express wants/needs with modA SLP Short Term Goal 3 (Week 4): Pt will complete simple/functional naming tasks w/ maxA multimodal cues SLP Short Term Goal 4 (Week 4): Patient will follow 1-step verbally presented directions with 75% accuracy given modA  Weekly Progress Updates: Patient is making progress towards therapy goals meeting 2 out of 4 long term goals set for CIR admission. Patient originally thought to have discharge date of 11/17/22, thus SLP worked towards long term goals for planned discharge. D/t difficulty finding SNF placement, patient's ELOS changed with new short term goals set to accommodate for week 4. Patient is currently naming basic environmental objects with modA when given field of 6-7 written choices and is tolerating regular/thin liquid diet with intermittent supervision. 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 Dependent upon communication modality, patient requires mod-maxA. Patient does best when provided with written directions or written choices, often picking from these choices independently when asked what he would like to eat/drink. Patient and family education ongoing. Patient will continue to benefit from skilled therapy services during remainder of CIR stay.    Intensity: Minumum of 1-2 x/day, 30 to 90 minutes Frequency: 3 to 5 out of 7 days Duration/Length of Stay: TBD given SNF placement Treatment/Interventions: Dysphagia/aspiration precaution training;Internal/external aids;Speech/Language facilitation;Cueing hierarchy;Therapeutic Activities;Functional tasks;Multimodal communication approach;Patient/family education;Therapeutic Exercise  Daily Session  Skilled Therapeutic Interventions: SLP conducted skilled therapy session targeting communication goals. During communication tasks, SLP presented patient with 1-step simple commands with patient requiring modA and visual models 50% of the time to accurately complete all requested directions. During session, patient received phone call from a scheduling office at another facility to schedule an outpatient appointment and significant communication breakdowns observed, requiring SLP to request discontinuation of phone call. SLP alerted patient's wife to caller and purpose of contact. SLP facilitated vowel production utilizing max-total multimodal cues including assistance with articulatory placement with patient achieving 75% accuracy with /a/, 100% accuracy with /o/, 50% accuracy with /u/ and 0% accuracy with /i/. During continued writing trials, patient demonstrated improved ability to write 'bed' and 'yes' spontaneously, requiring model for final letter. Continues to require max-totalA for 'hungry', 'water', and 'tired'. Patient writes name independently but requires model to copy for numerical birthday. SLP utilized patient  therapy schedule to facilitate basic daily questions utilizing scanning, reading, and organization skills. Given a field of three written choices, patient answered basic schedule questions using visual aid with 67% accuracy given  modA. Patient was left in lowered bed with call bell in reach and bed alarm set. SLP will continue to target goals per plan of care.       Pain Pain Assessment Pain Scale: 0-10 Pain Score: 0-No pain  Therapy/Group: Individual Therapy  Jeannie Done, M.A., CCC-SLP  [name] 11/18/2022, 8:48 AM

## 2022-11-18 NOTE — Progress Notes (Signed)
Physical Therapy Session Note  Patient Details  Name: Frank Barron MRN: [id number] Date of Birth: Mar 10, 1951  Today's Date: 11/18/2022 PT Individual Time: 0917-1015 PT Individual Time Calculation (min): 58 min   Short Term Goals: Week 3:  PT Short Term Goal 1 (Week 3): STG = LTG due to ELOS  Skilled Therapeutic Interventions/Progress Updates:      Therapy Documentation Precautions:  Precautions Precautions: Fall Precaution Comments: R hemiplegia, aphasia (inconsistent yes/no) Restrictions Weight Bearing Restrictions: No  Pt received semi-reclined, agreeable to PT and in good spirits. Pt engaged in minimal conversation that is appropriate and with one word responses. Pt able to verbalize his family members names, hometown, and birthday.   Pt wincing secondary to right hip pain, provided repositioning for relief. Pt (S) with rolling to the right and max A with rolling to the left. Pt dependent for peri-care. Pt max A with donning pants and total A for donning of shoes at bed level.   Pt min A x 1 with supine to sit to the left and max A x 1 with slide board transfer to TIS. Pt agreeable to go outside to elevate spirits.   Pt performed gluteal max isometrics with cue "stomp" on small ball. Pt performed x 3 with min activation of right LE and x 10 on L LE. Pt performed straight leg raises x 10 with L LE. Pt requires max cues for technique.   Pt returned to room and left in w/c at bedside with all needs in reach and alarm on.     Therapy/Group: Individual Therapy  Truitt Leep Truitt Leep PT, DPT  11/18/2022, 7:55 AM

## 2022-11-18 NOTE — Progress Notes (Signed)
Physical Therapy Session Note  Patient Details  Name: Frank Barron MRN: [id number] Date of Birth: Dec 19, 1950  Today's Date: 11/18/2022 PT Individual Time: 8657-8469 PT Individual Time Calculation (min): 52 min   Short Term Goals: Week 3:  PT Short Term Goal 1 (Week 3): STG = LTG due to ELOS  Skilled Therapeutic Interventions/Progress Updates:    Pt seated in w/c on arrival and agreeable to therapy. Pt expressed RLE discomfort during session, rest and activity modification as needed.   Attempted stand at rail with arm over therapist's shoulder, but this was not successful at this time, so transitioned to //bars. In bars, pt performed x 3 good stands with mod-max x 2. Cueing for anterior weight shift and hip extension, with pt able to stand fully upright x 1 after gentle hamstring stretching and passive ROM. On last stand, pt reports BM, so returned to room.   Stedy transfer with max A x 2 d/t R lateral lean. Max A sit>supine. Dependent brief change with +2, pt able to roll with mod A overall. Pt remained in bed and was left with all needs in reach and alarm active.   Therapy Documentation Precautions:  Precautions Precautions: Fall Precaution Comments: R hemiplegia, aphasia (inconsistent yes/no) Restrictions Weight Bearing Restrictions: No General:       Therapy/Group: Individual Therapy  [name] 11/18/2022, 4:03 PM

## 2022-11-19 DIAGNOSIS — N189 Chronic kidney disease, unspecified: Secondary | ICD-10-CM | POA: Insufficient documentation

## 2022-11-19 DIAGNOSIS — Z978 Presence of other specified devices: Secondary | ICD-10-CM

## 2022-11-19 MED ORDER — AMLODIPINE BESYLATE 2.5 MG PO TABS: 2.5000 mg | ORAL_TABLET | Freq: Every day | ORAL | Status: AC

## 2022-11-19 MED ORDER — BACLOFEN 10 MG PO TABS: 10.0000 mg | ORAL_TABLET | Freq: Three times a day (TID) | ORAL | Status: AC | PRN

## 2022-11-19 MED ORDER — CHLORHEXIDINE GLUCONATE CLOTH 2 % EX PADS: 6.0000 | MEDICATED_PAD | Freq: Two times a day (BID) | CUTANEOUS | Status: AC

## 2022-11-19 MED ORDER — APIXABAN 5 MG PO TABS: 5.0000 mg | ORAL_TABLET | Freq: Two times a day (BID) | ORAL | Status: AC

## 2022-11-19 MED ORDER — TIZANIDINE HCL 2 MG PO TABS: 2.0000 mg | ORAL_TABLET | Freq: Two times a day (BID) | ORAL | Status: AC

## 2022-11-19 NOTE — Progress Notes (Signed)
Patient ID: Frank Barron, male   DOB: 1950/10/17, 72 y.o.   MRN: [id number]  SW received fax reporting SNF auth approved.  SW spoke with Kia/ Admissions with [employer] to inform on auth. SW shared will confirm with MD if pt can d/c today.   SW called [employer] ([phone]/f:[phone]/ref #[id number]) confirming Berkley Harvey active.   Tentative transportation scheduled for 1pm p/u with [facility]. SW waiting on updates from attending to confirm pt can d/c today.   Attending confirms pt can d/c to SNF today.  SW updated medical team, and Kia/Admissions with [facility] Rm# [id number]; Nurse Report [phone].  *SW spoke with pt wife [name] and pt dtr [name] to provide updates on pt transition to SNF today. A lot of questions surrounding placement, follow-up care, and post acute care once released from SNF. SW shared will have Kia/Admissions coordinator follow-up. SW provided Rm number and contact information to facility.   SW discussed above with Admissions coord.   Cecile Sheerer, MSW, LCSWA Office: [phone] Cell: [phone] Fax: [phone]

## 2022-11-19 NOTE — Progress Notes (Signed)
Speech Language Pathology Daily Session Note  Patient Details  Name: Achille Millam MRN: [id number] Date of Birth: May 26, 1950  Today's Date: 11/19/2022 SLP Individual Time: 0805-0900  SLP Individual Time Calculation (min): 55 min   Short Term Goals: Week 4: SLP Short Term Goal 1 (Week 4): Patient will consume least restrictive diet texture w/ supervision visual/verbal cues to clear oral residue and R buccal pocketing SLP Short Term Goal 2 (Week 4): Patient will utilize multimodal means of communication to express wants/needs with modA SLP Short Term Goal 3 (Week 4): Pt will complete simple/functional naming tasks w/ maxA multimodal cues SLP Short Term Goal 4 (Week 4): Patient will follow 1-step verbally presented directions with 75% accuracy given modA  Skilled Therapeutic Interventions: SLP conducted therapeutic session targeting dysphagia management and communication goals. Patient greeted with RN in room and finishing breakfast with nurse noting no overt difficulty with meal tray. SLP assess patient tolerance of whole meds in thin liquids administered by RN noting no overt s/sx of penetration/aspiration across boluses. Notably, patient exhibited several eructations after finishing breakfast/medications and significantly delayed cough (~20 minutes) suggesting potential reflux, however difficult to assess bedside. SLP will continue to monitor, recommend continuation of current diet. SLP facilitated football schedule task utilizing previously made Cowboy[employer] game schedule to ask questions. When given field of 3 written choices and asked basic questions re: visual aid, patient answered via writing with 100% accuracy given minA. Writing proves most effective method of communication at this time despite need for model and written answer choices provided by communication partner. From written field of 6 choices, patient wrote name of 6 objects in the environment with 83% accuracy independently and 100%  accuracy when given minA. During following directions task, patient benefited from presenting direction both verbally and in written form. When provided both modalities simultaneously, patient followed directions with 100% accuracy given minA. Patient was left in lowered bed with call bell in reach and bed alarm set. SLP will continue to target goals per plan of care.      Pain Pain Assessment Pain Scale: 0-10 Pain Score: 0-No pain  Therapy/Group: Individual Therapy  Jeannie Done, M.A., CCC-SLP  [name] 11/19/2022, 8:27 AM

## 2022-11-19 NOTE — Plan of Care (Signed)
  Problem: RH Expression Communication Goal: LTG Patient will increase speech intelligibility (SLP) Description: LTG: Patient will increase speech intelligibility at word/phrase/conversation level with cues, % of the time (SLP) Outcome: Not Met (add Reason) Note: Severity of deficits   Problem: RH Swallowing Goal: LTG Patient will consume least restrictive diet using compensatory strategies with assistance (SLP) Description: LTG:  Patient will consume least restrictive diet using compensatory strategies with assistance (SLP) Outcome: Completed/Met   Problem: RH Expression Communication Goal: LTG Patient will express needs/wants via multi-modal(SLP) Description: LTG:  Patient will express needs/wants via multi-modal communication (gestures/written, etc) with cues (SLP) Outcome: Completed/Met Goal: LTG Patient will increase word finding of common (SLP) Description: LTG:  Patient will increase word finding of common objects/daily info/abstract thoughts with cues using compensatory strategies (SLP). Outcome: Completed/Met   Problem: RH Attention Goal: LTG Patient will demonstrate this level of attention during functional activites (SLP) Description: LTG:  Patient will will demonstrate this level of attention during functional activites (SLP) Outcome: Completed/Met

## 2022-11-19 NOTE — Progress Notes (Signed)
Report has been given to receiving nurse at [facility]. Cletis Media, RN

## 2022-11-19 NOTE — Plan of Care (Signed)
  Problem: RH Bed Mobility Goal: LTG Patient will perform bed mobility with assist (PT) Description: LTG: Patient will perform bed mobility with assistance, with/without cues (PT). Outcome: Adequate for Discharge Note: Not met due to lack of progress   Problem: RH Balance Goal: LTG Patient will maintain dynamic standing balance (PT) Description: LTG:  Patient will maintain dynamic standing balance with assistance during mobility activities (PT) Outcome: Not Met (add Reason) Note: Not met due to lack of progress and standing tolerance due to R hip pain   Problem: Sit to Stand Goal: LTG:  Patient will perform sit to stand with assistance level (PT) Description: LTG:  Patient will perform sit to stand with assistance level (PT) Outcome: Not Met (add Reason) Note: Not met due to lack of progress, poor tolerance to standing with R hip pain   Problem: RH Car Transfers Goal: LTG Patient will perform car transfers with assist (PT) Description: LTG: Patient will perform car transfers with assistance (PT). Outcome: Not Met (add Reason) Note: Unsafe to try at this time   Problem: RH Ambulation Goal: LTG Patient will ambulate in controlled environment (PT) Description: LTG: Patient will ambulate in a controlled environment, # of feet with assistance (PT). Outcome: Not Met (add Reason) Note: Not met due to lack of progress, poor tolerance to standing   Problem: RH Wheelchair Mobility Goal: LTG Patient will propel w/c in controlled environment (PT) Description: LTG: Patient will propel wheelchair in controlled environment, # of feet with assist (PT) Outcome: Not Met (add Reason) Note: Not met due to lack of progress, in attention to R side   Problem: RH Stairs Goal: LTG Patient will ambulate up and down stairs w/assist (PT) Description: LTG: Patient will ambulate up and down # of stairs with assistance (PT) Outcome: Not Met (add Reason) Note: Not met due to lack of progress, poor tolerance  to standing, R lateropulsion   Problem: RH Balance Goal: LTG Patient will maintain dynamic sitting balance (PT) Description: LTG:  Patient will maintain dynamic sitting balance with assistance during mobility activities (PT) Outcome: Completed/Met Flowsheets (Taken 10/27/2022 1608) LTG: Pt will maintain dynamic sitting balance during mobility activities with:: Supervision/Verbal cueing   Problem: RH Bed to Chair Transfers Goal: LTG Patient will perform bed/chair transfers w/assist (PT) Description: LTG: Patient will perform bed to chair transfers with assistance (PT). Outcome: Completed/Met

## 2022-11-19 NOTE — Progress Notes (Signed)
Inpatient Rehabilitation Discharge Medication Review by a Pharmacist  A complete drug regimen review was completed for this patient to identify any potential clinically significant medication issues.  High Risk Drug Classes Is patient taking? Indication by Medication  Antipsychotic No   Anticoagulant Yes Apixaban- Afib, DVT hx  Antibiotic No   Opioid No   Antiplatelet No   Hypoglycemics/insulin No   Vasoactive Medication Yes Amlodipine - HTN Tamsulosin - BPH  Chemotherapy No   Other Yes Baclofen, tizanidine- muscle spasms  Escitalopram - mood Gabapentin - pain Rosuvastatin - HLD Thiamine - supplement     Type of Medication Issue Identified Description of Issue Recommendation(s)  Drug Interaction(s) (clinically significant)     Duplicate Therapy     Allergy     No Medication Administration End Date     Incorrect Dose     Additional Drug Therapy Needed     Significant med changes from prior encounter (inform family/care partners about these prior to discharge).    Other       Clinically significant medication issues were identified that warrant physician communication and completion of prescribed/recommended actions by midnight of the next day:  No  Time spent performing this drug regimen review (minutes):  20 minutes  Okey Regal, PharmD

## 2022-11-19 NOTE — Progress Notes (Signed)
Speech Language Pathology Discharge Summary  Patient Details  Name: Frank Barron MRN: [id number] Date of Birth: [birth year]  Date of Discharge from SLP service:November 19, 2022  Patient has met 4 of 5 long term goals.  Patient to discharge at overall Mod level.  Reasons goals not met: Severity of impairments   Clinical Impression/Discharge Summary: Patient demonstrated strong progress throughout CIR admission meeting 4/5 long term goals set for duration. Patient continues to tolerate regular/thin liquid diet without difficulty, needing supervisionA to ensure use of swallow safety strategies. Re: communication, writing proves most effective method of communication at this time despite need for model and written answer choices provided by communication partner. From written field of 6 choices, patient wrote name of 6 objects in the environment with 83% accuracy independently and 100% accuracy when given minA. During following directions task, patient benefited from presenting direction both verbally and in written form. When provided both modalities simultaneously, patient followed directions with 100% accuracy given minA. During last targeted practice of speech, patient required totalA multimodal cues to produce /o/, /u/, and /a/ in isolation. Patient remains unstimulable for /i/. Patient and family education complete, patient appropriate for discharge from therapy services at current venue, though would benefit from continued SLP services at next venue of care to continue facilitation of communication goals.   Care Partner:  Caregiver Able to Provide Assistance: Yes  Type of Caregiver Assistance: Cognitive  Recommendation:  Skilled Nursing facility;24 hour supervision/assistance  Rationale for SLP Follow Up: Maximize functional communication;Maximize cognitive function and independence   Equipment: n/a   Reasons for discharge: Discharged from hospital   Patient/Family Agrees with Progress  Made and Goals Achieved: Yes   Jeannie Done, M.A., CCC-SLP   [name] 11/19/2022, 1:28 PM

## 2022-11-19 NOTE — Progress Notes (Signed)
PROGRESS NOTE   Subjective/Complaints:  New new concerns elicited this AM. Pt to discharge to SNF today around 1pm.   ROS: limited due to language/communication   Objective:   No results found. Recent Labs    11/18/22 0644  WBC 3.4*  HGB 12.0*  HCT 36.2*  PLT 161      Recent Labs    11/18/22 0644  NA 139  K 4.5  CL 107  CO2 27  GLUCOSE 90  BUN 21  CREATININE 1.25*  CALCIUM 9.1      Intake/Output Summary (Last 24 hours) at 11/19/2022 1057 Last data filed at 11/19/2022 0750 Gross per 24 hour  Intake 812 ml  Output 1100 ml  Net -288 ml        Physical Exam: Vital Signs Blood pressure 131/88, pulse 68, temperature 98 F (36.7 C), resp. rate 17, height 6\' 3"  (1.905 m), weight 103.3 kg, SpO2 97%.  Constitutional: No distress . Vital signs reviewed. Appears comfortable lying in bed HEENT: NCAT, EOMI, oral membranes moist  Neck: supple Cardiovascular: RRR without murmur. No JVD    Respiratory/Chest: CTA Bilaterally without wheezes or rales. Non-labored berathing GI/Abdomen: non-tender, non-distended, soft, normoactive BS Ext: no clubbing, cyanosis, or edema Psych: Cooperative, appropriate  Skin: Clean and intact without signs of breakdown over exposed surfaces Neuro: AAO to place  only + expressive greater than receptive aphasia - can correct with extra time and cueing + right hemineglect is still present CN: + right facial droop, decreased shoulder shrug on the right -unchanged  Strength 5 out of 5 in left upper extremity left lower extremity, with exception of left hip flexion 3-/5 due to pain Strength 0/5 RUE and RLE   sensation to light touch intact in right upper extremity and right lower extremity per patient.  Sensation to pain intact.  MAS 1 R elbow flexors, shoulder adductors, MCP and PIP flexors--easily ranged MAS 2 R hamstrings and MAS 2 right plantar flexors    -stable.  Assessment/Plan: 1. Functional deficits which require 3+ hours per day of interdisciplinary therapy in a comprehensive inpatient rehab setting. Physiatrist is providing close team supervision and 24 hour management of active medical problems listed below. Physiatrist and rehab team continue to assess barriers to discharge/monitor patient progress toward functional and medical goals  Care Tool:  Bathing    Body parts bathed by patient: Chest, Abdomen, Right upper leg, Left upper leg, Face, Right arm   Body parts bathed by helper: Right lower leg, Left lower leg, Buttocks, Front perineal area, Left arm     Bathing assist Assist Level: Maximal Assistance - Patient 24 - 49%     Upper Body Dressing/Undressing Upper body dressing   What is the patient wearing?: Pull over shirt    Upper body assist Assist Level: Moderate Assistance - Patient 50 - 74%    Lower Body Dressing/Undressing Lower body dressing      What is the patient wearing?: Pants     Lower body assist Assist for lower body dressing: Total Assistance - Patient < 25%     Toileting Toileting    Toileting assist Assist for toileting: Total Assistance - Patient < 25%  Transfers Chair/bed transfer  Transfers assist  Chair/bed transfer activity did not occur: Safety/medical concerns  Chair/bed transfer assist level: Minimal Assistance - Patient > 75%     Locomotion Ambulation   Ambulation assist   Ambulation activity did not occur: Safety/medical concerns  Assist level: 2 helpers Assistive device: Lite Gait Max distance: 50'   Walk 10 feet activity   Assist  Walk 10 feet activity did not occur: Safety/medical concerns  Assist level: 2 helpers Assistive device: Lite Gait   Walk 50 feet activity   Assist Walk 50 feet with 2 turns activity did not occur: Safety/medical concerns  Assist level: 2 helpers Assistive device: Lite Gait    Walk 150 feet activity   Assist Walk 150  feet activity did not occur: Safety/medical concerns         Walk 10 feet on uneven surface  activity   Assist Walk 10 feet on uneven surfaces activity did not occur: Safety/medical concerns         Wheelchair     Assist Is the patient using a wheelchair?: Yes Type of Wheelchair: Manual    Wheelchair assist level: Moderate Assistance - Patient 50 - 74% Max wheelchair distance: 160'    Wheelchair 50 feet with 2 turns activity    Assist        Assist Level: Moderate Assistance - Patient 50 - 74%   Wheelchair 150 feet activity     Assist      Assist Level: Moderate Assistance - Patient 50 - 74%   Blood pressure 131/88, pulse 68, temperature 98 F (36.7 C), resp. rate 17, height 6\' 3"  (1.905 m), weight 103.3 kg, SpO2 97%.  Medical Problem List and Plan: 1. 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 Pt has hx of prior CVA with residual aphasia.              -patient may shower -ELOS/Goals: SNF pending              -Order PRAFO/WHO -utilize nightly           -Continue CIR therapies including PT, OT, and SLP. Interdisciplinary team conference today to discuss goals, barriers to discharge, and dc planning.   - Wife is in the hospital with renal infection, family discussing supports.  No current change in dispo plan. -Aunt has been reviewing potential facilities  -OK to DC SNF today   2.  Antithrombotics: -DVT/anticoagulation:  Pharmaceutical: Xarelto 20mg  daily w/breakfast?>>now on eliquis 5mg  BID             -antiplatelet therapy: N/A  3. Pain Management/right sided spasticity:  Tylenol. Local measures.  --Per chart review was on tramadol? Naltrexone? Lidocaine patches?    -8-15: Increase spasticity right lower extremity, add baclofen 10 mg 3 times daily -8-16: Pain in right lower extremity, spasticity versus neuropathic, start gabapentin 100 mg 3 times daily.  Check renal  function on Monday 8/19: BL LE pain, aching today; increase tramadol to 50 mg BID; cannot increase baclofen/gabapentin d/t aki 8/20: Using PRN tylenol frequently; schedule tylenol 1000 mg TID, switch tramadol to 50 mg Q6H PRN 8-21: Cognition is improved and pain seems fairly well-controlled, monitor 8-27: DC tramadol, not utilizing -8-28: Ongoing issues with spasticity and knee, however would avoid baclofen increase due to CKD.  Could consider Botox as outpatient. 8/29: Make baclofen PRN d/t AKI -pending lab work 8-30 may resume at lower dose due to increased  knee and plantarflexion tone 9/3 will try low dose tizanidine 2mg  bid  4. Mood/Behavior/Sleep: LCSW to follow for evaluation and support. Lexapro 20mg  daily             -antipsychotic agents: N/A  -Trazodone PRN -not using, discontinue  5. Neuropsych/cognition: This patient is not capable of making decisions on his own behalf.  6. Skin/Wound Care: Routine pressure relief measures  7. Fluids/Electrolytes/ Nutrition/Dysphagia: Monitor I/O. Check CMET in am             --assist with meals  -8-15: Labs improved, discontinue IV fluid 50 cc/h; check Monday eating 75-100% meals per report  -therapies to work on encouraging p.o. fluids   -8/26 IVF 500 ccs today; encourage POs 9/2 labs appear close to baseline   Continue to push po  8. HTN: Home meds Amlodipine 5mg , Hydralazine 25mg  daily,  goal SBP < 180, long term goal normotensive -Monitor BP TID continue amlodipine - stable -8-15: Has been mostly normotensive, will reduce amlodipine back to 5 mg as perfusion difficulty may be contributing to cognitive difficulties at this time. -8-16: Remains hypotensive today, will DC amlodipine; no current medications -8/17-18/24 BPs improving, monitor 8/19: Orthostatic; IVF, TEDs, Binder, currently not on PO BP medications 8-22: Much better with improved water intake -stable -9/2-3 DBP remains elevated. Resume norvasc at 2.5mg  daily -9/4 DBP still  elevated at times, monitor response to medication change today -9/5 BP elevated at times but appears overall improved, continue to monitor  -9/6 BP controlled today  Vitals:   11/15/22 2053 11/16/22 0628 11/16/22 1238 11/16/22 2005  BP: (!) 132/90 121/82 114/83 (!) 133/101   11/17/22 0502 11/17/22 2010 11/18/22 0407 11/18/22 0748  BP: (!) 131/97 115/83 (!) 123/97 103/70   11/18/22 1439 11/18/22 2038 11/19/22 0523 11/19/22 0808  BP: (!) 140/109 104/73 120/88 131/88     9. A fib: Monitor HR TID. On xarelto. Heart rate controlled  -9/6 HR stable     11/19/2022    8:08 AM 11/19/2022    5:23 AM 11/18/2022    8:38 PM  Vitals with BMI  Systolic 131 120 865  Diastolic 88 88 73  Pulse  68 86     10.  Thrombocytopenia: Recheck platelets in am. Monitor for signs of bleeding.-- plt 156 on 10/28/22  -Platelets stable at 176 on 9/2  11. Dysphagia: On D2,thins. Encourage fluid intake   - see above  12. Acute on chronic renal failure:  SCr 1.32 at admission. Appears baseline 1.3-1.4 --Continue to monitor and encourage intake  - uptrending on last labs 8/12, encourage fluids, BMP in AM -stable, reassess q. Monday - 8/19: AKI Cr 1.6 again; IVF today 75 cc/hr - 8/20: 1.4 today, DC IVF, POs excellent -8-22: Creatinine 1.3, improved.  Stable on p.o. regimen. - 8/26: Cr increase, BUN downtrending; 500 cc IVF today, recheck in AM 8-27 -creatinine back to baseline, BUN unchanged; encourage p.o. fluids, monitor 8/29: AKI; labs appear volume depletion/pre-renal; encourage PO fluids and IVF today, BMP in AM 9/2: BMP with Cr near baseline-push fluids 9/5 BMP stable with Cr 1.26/Bun 21, continue to encourage fluids 9/6 continue to encourage PO fluid intake to avoid dehydration  13. Hyponatremia: Na dropped to 132 in 48 hours. Recheck in am   - 8-15: Stable 132-135, monitor   -8/19: 136; 8-29 back to 134  9/5 stable NA at 139  14.  H/o CAD s/p PCI: Monitor for symptoms with increase in activity.              --  continue Crestor 40mg  daily  15.  Situational depression: Lexapro 10mg  added on acute.    - Also benefit for motor recovery of UE; would not adjust  -8-27: Increase Lexapro for processing speed, motor recovery.  DC trazodone, tramadol for serotoninergic risks  8-28: Cognition does seem somewhat better today, will monitor for trend  8/29: Monitor BMPs given hyponatremia, however likely d/t dehydration, Tx as above  16. Hx of multiple cancers: R-RCC s/p nephrectomy 2015.  -Right adrenal pheochromocytoma  s/p right adrenalectomy 2017.  -Prostate CA s/p IMRT 2018.  Continue flomax -RUL nodule being monitored.  17. H/o Emphysema?: Noted on CT chest on care everywhere.    - stable  18. HLD. Continue statin as above  19. Substance abuse. Encourage cessation  20.  Urinary retention/Prostate CA s/p IMRT 2018.  Continue flomax.   -10/30/22 Remains incontinent but low PVRs, encourage up to bedside and monitor-- could claritin be interfering? -10/31/22 needed caths overnight; defer adjustments to weekday team 8/19: Add bethenacol 10 mg TID for retention 8/20: Increase to 25 mg TID 8-21: Increase bethanechol to 25 mg 3 times daily, and obtain renal ultrasound to ensure no obstruction.  If no improvement in 24 hours, will replace Foley. 8-22: Remains completely incontinent, renal ultrasound shows mildly thickened and trabeculated bladder but no gross obstruction.  Replace Foley, give patient urinary rest over the weekend and DC bethanechol due to potential side effects. - Appreciate urology evaluation and recommendation, maintain Foley with replacement every 30 days -outpatient follow-up for urodynamic studies  -foley stays in  20. Constipation: -10/30/22 reported BM 2 days ago but looks like he had two yesterday but none for 2 days prior. Will schedule senokot 1 tab daily and add miralax daily PRN for now-- monitor, if oversoftened/too frequent with senokot then may just switch back to PRN  but pt will have difficult time asking for PRNs -10/31/22 LBM yesterday, however has been smears since 8/17; increase bowel regimen to sennakot-s 2 tabs BID + miralax BID -11/08/22 LBM; smear 8/27 -sorbitol once this evening 8-28  -8/6 LBM large yesterday, continue current regimen   LOS: 24 days A FACE TO FACE EVALUATION WAS PERFORMED  Fanny Dance 11/19/2022, 10:57 AM

## 2022-11-19 NOTE — Progress Notes (Signed)
Physical Therapy Discharge Summary  Patient Details  Name: Frank Barron MRN: [id number] Date of Birth: 1950-12-23  Date of Discharge from PT service:November 19, 2022  Today's Date: 11/19/2022 PT Individual Time: 1401-1430 PT Individual Time Calculation (min): 29 min    Patient has met 2 of 9 long term goals due to improved activity tolerance, improved balance, improved postural control, increased strength, and ability to compensate for deficits.  Patient to discharge at a wheelchair level Mod Assist.   Patient's care partner  unable  to provide the necessary physical assistance at discharge so pt will benefit from continued skilled treatment interventions at skilled nursing facility.  Reasons goals not met: right hip pain limiting pt tolerance to standing, transfers as well as significant R lateropulsion, aphasia with inconsistent yes/nos, and motor planning and apraxia  Recommendation:  Patient will benefit from ongoing skilled PT services in skilled nursing facility setting to continue to advance safe functional mobility, address ongoing impairments in seated/standing balance, transfers, WC mobility, bed mobility, and minimize fall risk.  Equipment: No equipment provided  Reasons for discharge: discharge from hospital  Patient/family agrees with progress made and goals achieved: Yes    Skilled therapeutic interventions: Pt presents in room in bed, agreeable to PT. Pt indicates that he has pain in his RLE at this time, declines intervention. Session focused on bed mobility, BLE movements, and sequencing for scooting.  Pt completes RLE trace/2- muscle activation for all muscle groups completed in supine position, active assist for hip flexion/abduction/adduction, knee extension/flexion. Pt completes rolling bilaterally, to R with min assist and cues, to L with max assist due to increased tone in RLE. Pt completes supine<>sit with min assist for RLE mgmt and cues for positioning. Pt  completes scooting to L along EOB with supervision progress to needing mod assist with fatigue. Pt returns to supine where he remains with all needs within reach, call light in place, and bed alarm activated at end of session.  PT Discharge Precautions/Restrictions Precautions Precautions: Fall Precaution Comments: R hemiplegia, aphasia (inconsistent yes/no) Restrictions Weight Bearing Restrictions: No Pain Interference Pain Interference Pain Effect on Sleep: 3. Frequently Pain Interference with Therapy Activities: 3. Frequently Pain Interference with Day-to-Day Activities: 3. Frequently Cognition Overall Cognitive Status: Impaired/Different from baseline Arousal/Alertness: Awake/alert Orientation Level: Oriented to person;Oriented to place;Oriented to time Year: 2024 Month: September Day of Week: Correct Attention: Focused;Sustained Focused Attention: Appears intact Sustained Attention: Appears intact Memory: Impaired Awareness: Impaired Awareness Impairment: Intellectual impairment Problem Solving: Impaired Problem Solving Impairment: Functional basic Executive Function: Organizing;Self Monitoring Organizing: Impaired Organizing Impairment: Functional basic Self Monitoring: Impaired Self Monitoring Impairment: Functional basic Safety/Judgment: Impaired Comments: difficult to fully assess 2/2 aphasia Sensation Sensation Light Touch: Impaired by gross assessment Additional Comments: Sensation impaired in the RUE/RLE, difficult to assess with aphasia Coordination Gross Motor Movements are Fluid and Coordinated: No Fine Motor Movements are Fluid and Coordinated: No Coordination and Movement Description: Dense R hemiplegia Motor  Motor Motor - Discharge Observations: dense R hemiplegia, tone improved slightly from eval  Mobility Bed Mobility Bed Mobility: Sit to Supine;Supine to Sit Rolling Right: Minimal Assistance - Patient > 75% Rolling Left: Maximal Assistance -  Patient 25-49% Supine to Sit: Minimal Assistance - Patient > 75% Sit to Supine: Minimal Assistance - Patient > 75% Locomotion  Gait Ambulation: No Gait Gait: No Stairs / Additional Locomotion Stairs: No Wheelchair Mobility Wheelchair Mobility: No  Trunk/Postural Assessment  Cervical Assessment Cervical Assessment: Within Functional Limits Thoracic Assessment Thoracic Assessment: Within Functional Limits  Postural Control Postural Control: Deficits on evaluation Righting Reactions: decreased Protective Responses: decreased and inadequate  Balance Balance Balance Assessed: Yes Static Sitting Balance Static Sitting - Balance Support: Feet supported;Bilateral upper extremity supported Static Sitting - Level of Assistance: 5: Stand by assistance Dynamic Sitting Balance Dynamic Sitting - Balance Support: Feet supported Dynamic Sitting - Level of Assistance: 5: Stand by assistance Extremity Assessment  RLE Assessment RLE Assessment: Exceptions to [facility] RLE Strength Right Hip Flexion: 2-/5 Right Hip ABduction: 1/5 Right Knee Flexion: 1/5 Right Knee Extension: 1/5 Right Ankle Dorsiflexion: 1/5 Right Ankle Plantar Flexion: 2-/5 RLE Tone RLE Tone: Hypertonic;Modified Ashworth;Moderate Body Part - Modified Ashworth Scale: Quadriceps QUADRICEPS - Modified Ashworth Scale for Grading Hypertonia RLE: More marked increase in muscle tone through most of the ROM, but affected part(s) easily moved LLE Assessment LLE Assessment: Within Functional Limits General Strength Comments: grossly 5/5   Edwin Cap PT, DPT 11/19/2022, 4:48 PM

## 2022-11-19 NOTE — Plan of Care (Signed)
  Problem: Sit to Stand Goal: LTG:  Patient will perform sit to stand in prep for activites of daily living with assistance level (OT) Description: LTG:  Patient will perform sit to stand in prep for activites of daily living with assistance level (OT) Outcome: Not Applicable   Problem: RH Grooming Goal: LTG Patient will perform grooming w/assist,cues/equip (OT) Description: LTG: Patient will perform grooming with assist, with/without cues using equipment (OT) Outcome: Completed/Met   Problem: RH Bathing Goal: LTG Patient will bathe all body parts with assist levels (OT) Description: LTG: Patient will bathe all body parts with assist levels (OT) Outcome: Completed/Met    Problem: RH Dressing Goal: LTG Patient will perform upper body dressing (OT) Description: LTG Patient will perform upper body dressing with assist, with/without cues (OT). Outcome: Completed/Met Goal: LTG Patient will perform lower body dressing w/assist (OT) Description: LTG: Patient will perform lower body dressing with assist, with/without cues in positioning using equipment (OT) Outcome: Not Applicable   Problem: RH Toileting Goal: LTG Patient will perform toileting task (3/3 steps) with assistance level (OT) Description: LTG: Patient will perform toileting task (3/3 steps) with assistance level (OT)  Outcome: Not Applicable   Problem: RH Toilet Transfers Goal: LTG Patient will perform toilet transfers w/assist (OT) Description: LTG: Patient will perform toilet transfers with assist, with/without cues using equipment (OT) Outcome: Not Applicable   Problem: RH Functional Use of Upper Extremity Goal: LTG Patient will use RT/LT upper extremity as a (OT) Description: LTG: Patient will use right/left upper extremity as a stabilizer/gross assist/diminished/nondominant/dominant level with assist, with/without cues during functional activity (OT) Outcome: Completed/Met

## 2022-11-19 NOTE — Progress Notes (Signed)
Occupational Therapy Discharge Summary  Patient Details  Name: Frank Barron MRN: [id number] Date of Birth: 1950/07/15  Date of Discharge from OT service:November 19, 2022  Today's Date: 11/19/2022 OT Individual Time: 1115-1200 OT Individual Time Calculation (min): 45 min   Pt greeted semi-reclined in bed agreeable to OT treatment session. Bed level LB ADLs completed, then pt transitioned to sitting EOB for UB BADL tasks. See below for further details regarding BADL performance. Gentle stretching and ROM to R UE with improvement in tone. Pt left semi-reclined in bed at end of session with bed alarm on, call bell in reach, and needs met.   Patient has met 4 of 4 long term goals due to improved activity tolerance, improved balance, postural control, ability to compensate for deficits, improved attention, and improved awareness.  Patient to discharge at overall Mod Assist/max A level.  Patient's care partner unavailable to provide the necessary physical and cognitive assistance at discharge. Pt to dc to SNF for continued OT services.     Reasons goals not met: n/a  Recommendation:  Patient will benefit from ongoing skilled OT services in skilled nursing facility setting to continue to advance functional skills in the area of BADL, Reduce care partner burden, and functional use of R UE .  Equipment: No equipment provided  Reasons for discharge: treatment goals met and discharge from hospital  Patient/family agrees with progress made and goals achieved: Yes  OT Discharge Precautions/Restrictions  Precautions Precaution Comments: R hemiplegia, aphasia (inconsistent yes/no) Restrictions Weight Bearing Restrictions: No General OT Amount of Missed Time: 30 Minutes Vital Signs Therapy Vitals BP: 131/88 Pain Pain Assessment Pain Scale: 0-10 Pain Score: 0-No pain ADL ADL Eating: Set up Where Assessed-Eating: Chair Grooming: Supervision/safety Where Assessed-Grooming: Sitting at  sink Upper Body Bathing: Minimal assistance Where Assessed-Upper Body Bathing: Edge of bed Lower Body Bathing: Maximal assistance Where Assessed-Lower Body Bathing: Bed level Upper Body Dressing: Minimal assistance Where Assessed-Upper Body Dressing: Edge of bed Lower Body Dressing: Maximal assistance Where Assessed-Lower Body Dressing: Edge of bed Toileting: Maximal assistance Where Assessed-Toileting: Bed level Toilet Transfer: Moderate assistance Toilet Transfer Method: Other (comment) (stedy) Vision   Perception  Perception: Impaired Perception-Other Comments: R inatttention greatly improved since eval Praxis Praxis: Impaired Praxis Impairment Details: Motor planning Praxis-Other Comments: improved since eval Cognition Cognition Overall Cognitive Status: Impaired/Different from baseline Arousal/Alertness: Awake/alert Orientation Level: Nonverbal/unable to assess Awareness: Impaired Problem Solving: Impaired Safety/Judgment: Impaired Comments: difficult to assess 2/2 aphasia, follows commands within BADL tasks and tries to express needs and wants appropriately Brief Interview for Mental Status (BIMS) Repetition of Three Words (First Attempt): 1 (able to repeat sock only due to aphasia) Temporal Orientation: Year: No answer Temporal Orientation: Month: No answer Temporal Orientation: Day: No answer Recall: "Sock": No answer Recall: "Blue": No answer Recall: "Bed": No answer BIMS Summary Score: 99 Sensation Sensation Light Touch: Impaired by gross assessment Coordination Gross Motor Movements are Fluid and Coordinated: No Fine Motor Movements are Fluid and Coordinated: No Coordination and Movement Description: Dense R hemiplegia Motor  Motor Motor: Hemiplegia Motor - Skilled Clinical Observations: R hemiplegia Motor - Discharge Observations: Dense R hemiplegia with tone Mobility  Bed Mobility Bed Mobility: Sit to Supine;Supine to Sit Supine to Sit: Moderate  Assistance - Patient 50-74% Sit to Supine: Minimal Assistance - Patient > 75% Transfers Sit to Stand: Maximal Assistance - Patient 25-49% Stand to Sit: Maximal Assistance - Patient 25-49%  Trunk/Postural Assessment  Cervical Assessment Cervical Assessment: Within Functional Limits Thoracic Assessment Thoracic  Assessment: Within Functional Limits  Balance Static Sitting Balance Static Sitting - Balance Support: Feet supported;Bilateral upper extremity supported Static Sitting - Level of Assistance: 5: Stand by assistance Dynamic Sitting Balance Dynamic Sitting - Balance Support: Feet supported Dynamic Sitting - Level of Assistance: 5: Stand by assistance Static Standing Balance Static Standing - Level of Assistance: 2: Max assist Dynamic Standing Balance Dynamic Standing - Level of Assistance: 2: Max assist Extremity/Trunk Assessment RUE Assessment RUE Assessment: Exceptions to [facility] General Strength Comments: Flexor tone throughout R UE, trace voluntary shoulder activation RUE Body System: Neuro Brunstrum levels for arm and hand: Arm;Hand Brunstrum level for arm: Stage II Synergy is developing Brunstrum level for hand: Stage I Flaccidity LUE Assessment LUE Assessment: Within Functional Limits   Merlene Laughter Elaura Calix 11/19/2022, 12:01 PM

## 2022-11-22 NOTE — Progress Notes (Signed)
Inpatient Rehabilitation Care Coordinator Discharge Note   Patient Details  Name: Frank Barron MRN: [id number] Date of Birth: [birth year]   Discharge location: D/c to SNF- [facility]  Length of Stay: 24 days  Discharge activity level: Max Asst  Home/community participation: Limited  Patient response NW:GNFAOZ Literacy - How often do you need to have someone help you when you read instructions, pamphlets, or other written material from your doctor or pharmacy?: Often  Patient response HY:[id number] Isolation - How often do you feel lonely or isolated from those around you?: Patient unable to respond  Services provided included: MD, RD, PT, OT, SLP, CM, Pharmacy, Neuropsych, SW, RN, TR  Financial Services:  Field seismologist Utilized: Private Insurance [employer]- [employer] [facility] plan  Choices offered to/list presented to: patient dtr  Follow-up services arranged:  Other (Comment) (N/A)           Patient response to transportation need: Is the patient able to respond to transportation needs?: Yes In the past 12 months, has lack of transportation kept you from medical appointments or from getting medications?: No In the past 12 months, has lack of transportation kept you from meetings, work, or from getting things needed for daily living?: No   Patient/Family verbalized understanding of follow-up arrangements:  Yes  Individual responsible for coordination of the follow-up plan: contact pt dtr [name]#[phone]  (lives in [place] but [facility] medicine physician and will help coordinate care) or pt wife [name] [phone]   (slight cog defecits)  Confirmed correct DME delivered: Gretchen Short 11/22/2022    Comments (or additional information):  Summary of Stay    Date/Time Discharge Planning CSW  11/16/22 0937 Pt is  SNF placement. SW waiting on preference from family and family will view locations today.  Discharge will  be pending SNF bed  availability and insurance approval. SW will confirm there are no barriers to discharge. AAC  11/08/22 1129 Discharge is pending based on level of care required at discharge. If pt discharges home at a supervision level, pt will have intermittent support from siblings. Pt wife unable to provide care due to being in the hospital. Karlene Einstein on Wed (8/28) 9am-12pm with pt brother [name]. SW will confirm there are no barriers to discharge. AAC  11/01/22 0810 Discharge is pending based on levle of care required at discharge. If pt discharges home at a supervision level, pt will have intermittent support from siblings. SW will confirm there are no barriers to discharge. AAC       Fontaine Kossman A Lula Olszewski

## 2023-02-01 ENCOUNTER — Other Ambulatory Visit: Payer: Self-pay

## 2023-02-01 NOTE — Patient Outreach (Signed)
First telephone outreach attempt to obtain mRS. No answer. Left message for returned call.  Vanice Sarah Care Management Assistant [phone]

## 2023-02-03 ENCOUNTER — Other Ambulatory Visit: Payer: Self-pay

## 2023-02-03 NOTE — Patient Outreach (Signed)
Second telephone outreach attempt to obtain mRS. No answer. Left message for returned call.  Vanice Sarah Care Management Assistant [phone]

## 2023-02-04 ENCOUNTER — Other Ambulatory Visit: Payer: Self-pay

## 2023-02-04 NOTE — Patient Outreach (Signed)
3 outreach attempts were completed to obtain mRs. mRs could not be obtained because patient never returned my calls. mRs=7    Vanice Sarah Care Management Assistant [phone]

## 2023-02-09 ENCOUNTER — Inpatient Hospital Stay: Payer: Medicare PPO | Admitting: Neurology

## 2023-02-09 ENCOUNTER — Encounter: Payer: Self-pay | Admitting: Neurology
